# Patient Record
Sex: Female | Born: 1975 | Race: Black or African American | Hispanic: No | Marital: Single | Smoking: Never smoker
Health system: Southern US, Community
[De-identification: ages and names within clinical notes are randomized; demographics above are authoritative.]

## PROBLEM LIST (undated history)

## (undated) DIAGNOSIS — I10 Essential (primary) hypertension: Secondary | ICD-10-CM

## (undated) DIAGNOSIS — I5030 Unspecified diastolic (congestive) heart failure: Secondary | ICD-10-CM

## (undated) DIAGNOSIS — N184 Chronic kidney disease, stage 4 (severe): Secondary | ICD-10-CM

## (undated) DIAGNOSIS — E119 Type 2 diabetes mellitus without complications: Secondary | ICD-10-CM

## (undated) DIAGNOSIS — I509 Heart failure, unspecified: Secondary | ICD-10-CM

## (undated) HISTORY — DX: Heart failure, unspecified: I50.9

## (undated) HISTORY — PX: TUBAL LIGATION: SHX77

---

## 2009-05-11 ENCOUNTER — Ambulatory Visit: Payer: Self-pay | Admitting: Family

## 2012-01-03 DIAGNOSIS — M1710 Unilateral primary osteoarthritis, unspecified knee: Secondary | ICD-10-CM | POA: Diagnosis not present

## 2013-07-10 DIAGNOSIS — R635 Abnormal weight gain: Secondary | ICD-10-CM | POA: Diagnosis not present

## 2013-07-10 DIAGNOSIS — D509 Iron deficiency anemia, unspecified: Secondary | ICD-10-CM | POA: Diagnosis not present

## 2013-07-10 DIAGNOSIS — E669 Obesity, unspecified: Secondary | ICD-10-CM | POA: Diagnosis not present

## 2013-07-10 DIAGNOSIS — I10 Essential (primary) hypertension: Secondary | ICD-10-CM | POA: Diagnosis not present

## 2013-07-16 DIAGNOSIS — R635 Abnormal weight gain: Secondary | ICD-10-CM | POA: Diagnosis not present

## 2013-07-16 DIAGNOSIS — E669 Obesity, unspecified: Secondary | ICD-10-CM | POA: Diagnosis not present

## 2013-07-16 DIAGNOSIS — D509 Iron deficiency anemia, unspecified: Secondary | ICD-10-CM | POA: Diagnosis not present

## 2013-07-16 DIAGNOSIS — I10 Essential (primary) hypertension: Secondary | ICD-10-CM | POA: Diagnosis not present

## 2014-01-05 DIAGNOSIS — M545 Low back pain, unspecified: Secondary | ICD-10-CM | POA: Diagnosis not present

## 2014-01-05 DIAGNOSIS — R079 Chest pain, unspecified: Secondary | ICD-10-CM | POA: Diagnosis not present

## 2014-01-05 DIAGNOSIS — M25519 Pain in unspecified shoulder: Secondary | ICD-10-CM | POA: Diagnosis not present

## 2014-01-05 DIAGNOSIS — M542 Cervicalgia: Secondary | ICD-10-CM | POA: Diagnosis not present

## 2014-02-19 DIAGNOSIS — I10 Essential (primary) hypertension: Secondary | ICD-10-CM | POA: Diagnosis not present

## 2014-02-19 DIAGNOSIS — D509 Iron deficiency anemia, unspecified: Secondary | ICD-10-CM | POA: Diagnosis not present

## 2014-02-19 DIAGNOSIS — S40019A Contusion of unspecified shoulder, initial encounter: Secondary | ICD-10-CM | POA: Diagnosis not present

## 2015-03-04 DIAGNOSIS — E876 Hypokalemia: Secondary | ICD-10-CM | POA: Diagnosis not present

## 2015-03-04 DIAGNOSIS — I501 Left ventricular failure: Secondary | ICD-10-CM | POA: Diagnosis not present

## 2015-03-04 DIAGNOSIS — R739 Hyperglycemia, unspecified: Secondary | ICD-10-CM | POA: Diagnosis not present

## 2015-03-04 DIAGNOSIS — R0602 Shortness of breath: Secondary | ICD-10-CM | POA: Diagnosis not present

## 2015-03-04 DIAGNOSIS — N289 Disorder of kidney and ureter, unspecified: Secondary | ICD-10-CM | POA: Diagnosis not present

## 2015-03-04 DIAGNOSIS — I10 Essential (primary) hypertension: Secondary | ICD-10-CM | POA: Diagnosis not present

## 2015-03-04 DIAGNOSIS — R05 Cough: Secondary | ICD-10-CM | POA: Diagnosis not present

## 2015-03-04 DIAGNOSIS — R079 Chest pain, unspecified: Secondary | ICD-10-CM | POA: Diagnosis not present

## 2015-03-05 ENCOUNTER — Inpatient Hospital Stay: Payer: Self-pay | Admitting: Internal Medicine

## 2015-03-05 DIAGNOSIS — R05 Cough: Secondary | ICD-10-CM | POA: Diagnosis not present

## 2015-03-05 DIAGNOSIS — E876 Hypokalemia: Secondary | ICD-10-CM | POA: Diagnosis not present

## 2015-03-05 DIAGNOSIS — I340 Nonrheumatic mitral (valve) insufficiency: Secondary | ICD-10-CM | POA: Diagnosis not present

## 2015-03-05 DIAGNOSIS — T502X5A Adverse effect of carbonic-anhydrase inhibitors, benzothiadiazides and other diuretics, initial encounter: Secondary | ICD-10-CM | POA: Diagnosis present

## 2015-03-05 DIAGNOSIS — I501 Left ventricular failure: Secondary | ICD-10-CM | POA: Diagnosis not present

## 2015-03-05 DIAGNOSIS — E1165 Type 2 diabetes mellitus with hyperglycemia: Secondary | ICD-10-CM | POA: Diagnosis present

## 2015-03-05 DIAGNOSIS — Z6841 Body Mass Index (BMI) 40.0 and over, adult: Secondary | ICD-10-CM | POA: Diagnosis not present

## 2015-03-05 DIAGNOSIS — R0602 Shortness of breath: Secondary | ICD-10-CM | POA: Diagnosis not present

## 2015-03-05 DIAGNOSIS — N289 Disorder of kidney and ureter, unspecified: Secondary | ICD-10-CM | POA: Diagnosis not present

## 2015-03-05 DIAGNOSIS — I10 Essential (primary) hypertension: Secondary | ICD-10-CM | POA: Diagnosis not present

## 2015-03-05 DIAGNOSIS — I129 Hypertensive chronic kidney disease with stage 1 through stage 4 chronic kidney disease, or unspecified chronic kidney disease: Secondary | ICD-10-CM | POA: Diagnosis present

## 2015-03-05 DIAGNOSIS — N183 Chronic kidney disease, stage 3 (moderate): Secondary | ICD-10-CM | POA: Diagnosis not present

## 2015-03-05 DIAGNOSIS — I429 Cardiomyopathy, unspecified: Secondary | ICD-10-CM | POA: Diagnosis present

## 2015-03-05 DIAGNOSIS — R7301 Impaired fasting glucose: Secondary | ICD-10-CM | POA: Diagnosis not present

## 2015-03-05 DIAGNOSIS — R079 Chest pain, unspecified: Secondary | ICD-10-CM | POA: Diagnosis not present

## 2015-03-18 ENCOUNTER — Emergency Department: Payer: Self-pay | Admitting: Emergency Medicine

## 2015-03-18 DIAGNOSIS — R609 Edema, unspecified: Secondary | ICD-10-CM | POA: Diagnosis not present

## 2015-03-18 DIAGNOSIS — I10 Essential (primary) hypertension: Secondary | ICD-10-CM | POA: Diagnosis not present

## 2015-04-08 ENCOUNTER — Encounter: Payer: Self-pay | Admitting: Family Medicine

## 2015-04-08 DIAGNOSIS — N183 Chronic kidney disease, stage 3 unspecified: Secondary | ICD-10-CM | POA: Insufficient documentation

## 2015-04-08 DIAGNOSIS — I10 Essential (primary) hypertension: Secondary | ICD-10-CM | POA: Insufficient documentation

## 2015-04-08 DIAGNOSIS — E119 Type 2 diabetes mellitus without complications: Secondary | ICD-10-CM | POA: Insufficient documentation

## 2015-04-08 DIAGNOSIS — I429 Cardiomyopathy, unspecified: Secondary | ICD-10-CM | POA: Insufficient documentation

## 2015-04-11 DIAGNOSIS — I10 Essential (primary) hypertension: Secondary | ICD-10-CM | POA: Diagnosis not present

## 2015-04-11 DIAGNOSIS — N183 Chronic kidney disease, stage 3 (moderate): Secondary | ICD-10-CM | POA: Diagnosis not present

## 2015-04-11 DIAGNOSIS — J302 Other seasonal allergic rhinitis: Secondary | ICD-10-CM | POA: Diagnosis not present

## 2015-04-11 DIAGNOSIS — E1122 Type 2 diabetes mellitus with diabetic chronic kidney disease: Secondary | ICD-10-CM | POA: Diagnosis not present

## 2015-04-11 DIAGNOSIS — I429 Cardiomyopathy, unspecified: Secondary | ICD-10-CM | POA: Diagnosis not present

## 2015-04-11 DIAGNOSIS — R188 Other ascites: Secondary | ICD-10-CM | POA: Diagnosis not present

## 2015-04-11 LAB — BASIC METABOLIC PANEL
BUN: 30 mg/dL — AB (ref 4–21)
Creatinine: 1.5 mg/dL — AB (ref <=1.1)

## 2015-04-11 LAB — LIPID PANEL: LDL Cholesterol: 110 mg/dL

## 2015-04-11 LAB — TSH: TSH: 4.95 u[IU]/mL (ref <=5.90)

## 2015-04-11 LAB — HEMOGLOBIN A1C: Hgb A1c MFr Bld: 7 % — AB (ref 4.0–6.0)

## 2015-04-14 ENCOUNTER — Encounter: Payer: Self-pay | Admitting: Family Medicine

## 2015-05-01 NOTE — Discharge Summary (Signed)
PATIENT NAME:  Renee [name], RUSSIN MR#:  [id number] DATE OF BIRTH:  01-10-1976  DATE OF ADMISSION:  03/05/2015 DATE OF DISCHARGE:  03/07/2015  PRIMARY CARE PHYSICIAN: Will be 1 of the following physicians that I gave the patient names to. The patient will call to set up an appointment.   FINAL DIAGNOSES: 1.  Accelerated hypertension.  2.  Cardiomyopathy.  3.  Chronic kidney disease, stage 3. 4.  Diet-controlled diabetes.  5.  Morbid obesity with a body mass index of 42.3.   MEDICATIONS ON DISCHARGE: Include lisinopril 10 mg daily, clonidine 0.2 mg twice a day, labetalol 200 mg twice a day, hydralazine 25 mg 4 times a day.   HOME OXYGEN: None.   DIET: Low-sodium carbohydrate-controlled diet, regular consistency.   ACTIVITY: As tolerated.   FOLLOWUP: In 1-2 weeks with 1 of the primary care physicians that I gave him names to.   HOSPITAL COURSE: The patient was admitted on March 5, discharged March 7, came in with heaviness in the chest, shortness of breath, found to have very elevated blood pressure and hypokalemia.   LABORATORY AND RADIOLOGICAL DATA DURING THE HOSPITAL COURSE: Included an EKG that showed sinus tachycardia, premature atrial complexes, left atrial enlargement, left ventricular hypertrophy. First troponin negative. White blood cell count 14.4, H and H 12.3 and 38.6, platelet count of 348,000. Glucose 146, BUN 23, creatinine 1.48, sodium 141, potassium 2.9, chloride 104, CO2 of 28, calcium 8.9. Liver function tests, albumin low at 2.9. Other liver function tests normal range. Chest x-ray showed mild to moderate cardiomegaly, no evidence of focal infiltrate or pleural effusion.  Next 2 troponins were negative. Echocardiogram showed an EF of 30%-35%, moderately to severely decreased left ventricular systolic function, severe concentric left ventricular hypertrophy, severely increased left ventricular septal thickness, mildly dilated left atrium, moderately elevated pulmonary arterial  systolic pressure. Ultrasound of the kidneys, negative. No evidence of hydronephrosis or other significant abnormality. Creatinine upon discharge 1.62, potassium 3.5. Hemoglobin A1c of 6.7.   Hospital course per problem list:  1.  For the patient's accelerated hypertension, blood pressure needed quite a few medications to get it titrated in the right range. It took a few days to do that. I actually cut back on the Norvasc secondary to cost and went with lisinopril 10 mg daily, clonidine 0.2 mg twice a day, labetalol 200 mg twice a day and hydralazine 25 mg 4 times a day. Blood pressure upon discharge 148/93, which is much improved from the patient's blood pressures and her highest was 225/148.  2.  Cardiomyopathy with a low ejection fraction. No signs of congestive heart failure on this hospital course. Low-dose lisinopril and beta blocker prescribed for cardioprotection. Follow up with medical doctor as outpatient. Patient does have increased septal wall thickness and cardiomyopathy. Further workup can be done as outpatient.  3.  Chronic kidney disease stage 3. Need to watch this closely, and especially starting on ACE inhibitor.  4.  Diet-controlled diabetes. Diet discussed at length.  5.  Morbid obesity with a BMI of 42.3. The patient can consider bariatric surgery referral as outpatient.  6.  Hypokalemia. Hydrochlorothiazide was stopped which she was taking as outpatient. Hopefully, potassium will normalize without a problem. Adding lisinopril should help. I did send off a renin and aldosterone level, which is still pending at this time.   TIME SPENT ON DISCHARGE: 35 minutes.    ____________________________ Tana Conch. Leslye Peer, MD rjw:LT D: 03/07/2015 15:42:34 ET T: 03/07/2015 18:51:15 ET JOB#: [id number]  cc:  Bellatrix Devonshire J. Leslye Peer, MD, <Dictator> Marisue Brooklyn MD ELECTRONICALLY SIGNED 03/09/2015 10:53

## 2015-05-01 NOTE — H&P (Signed)
PATIENT NAME:  Renee [name], Renee [name] MR#:  [id number] DATE OF BIRTH:  Jul 26, 1976  DATE OF ADMISSION:  03/05/2015  REFERRING DOCTOR: Ahmed Prima, MD   PRIMARY CARE PRACTITIONER: [facility]     ADMITTING DOCTOR: Juluis Mire, MD   CHIEF COMPLAINT: Heaviness in the chest with associated shortness of breath ongoing for the past 3 to 4 days.     HISTORY OF PRESENT ILLNESS: A 39 year old African American female with a history of poorly controlled hypertension for the past 1-1/2 years  presents to the Emergency Room with the complaints of heaviness in the chest with associated shortness of breath, which started about 3 to 4 days ago, which gradually worsened. The patient stated that she ran out of her antihypertensive medication, that is hydrochlorothiazide, about 6 days ago, and since then she has been feeling heaviness in the chest with shortness of breath but denies any specific chest pain. She did have some nausea and 1 vomiting but otherwise denies any vomiting at this time. No abdominal pain. Denies any headache. No history of any focal weakness or numbness. No history of any fever. She does have some cough with clear sputum. No urinary symptoms.   In the Emergency Room, the patient was evaluated by the ED physician and was noted to have a significantly elevated blood pressure with systolic of [id number] and diastolic of [id number] mmHg. Workup revealed mildly elevated BUN, creatinine, and a potassium of 2.9 and an EKG with sinus tachycardia with premature atrial contractions. The patient was given IV furosemide and IV labetalol and IV hydralazine for the control of her blood pressure. The chest x-ray done in the ED revealed moderate cardiomegaly and moderate pulmonary vascular congestion. At the current time patient is comfortably resting in the bed. She looks a little bit anxious but denies any chest discomfort, shortness of breath. She diuresed following IV furosemide. As mentioned earlier, the patient  was diagnosed to have hypertension about 1-1/2 years ago, and since then she has been put on hydrochlorothiazide 25 mg 1 tablet a day and she mentions that her blood pressure has been poorly controlled.   PAST MEDICAL HISTORY: Hypertension.   PAST SURGICAL HISTORY:  1.  Tubal ligation.  2.  C-section.   ALLERGIES: No known drug allergies.   HOME MEDICATIONS: Hydrochlorothiazide.   FAMILY HISTORY: Mom with congestive heart failure, diabetes, and hypertension.   SOCIAL HISTORY: She is single. She used to be a Music therapist. Currently unemployed. Denies any history of smoking, occasional alcohol intake, and denies any substance abuse.    REVIEW OF SYSTEMS:  CONSTITUTIONAL: Negative for fever or chills. She does have some generalized weakness for the past 2 to 3 days with shortness of breath.  EYES: Negative for blurred vision or double vision. No pain. No redness. No discharge.  EARS, NOSE, AND THROAT: Negative for tinnitus, ear pain, hearing loss, epistaxis, nasal discharge.  RESPIRATORY: Positive for some cough with clear sputum, but she did have some heaviness in the chest with shortness of breath but denies any wheezing. No hemoptysis. No painful respirations.  CARDIOVASCULAR: Positive for heaviness in the chest but negative for any chest pain. Denies any palpitations. No dizziness. No syncopal episodes. She does have some shortness of breath with heaviness in the chest. Denies any pedal edema.  GASTROINTESTINAL: Positive for nausea but had some vomiting earlier but no vomiting now. No abdominal pain. No hematemesis. No melena. No GERD symptoms.  GENITOURINARY: Negative for dysuria, frequency, urgency.  ENDOCRINE: Negative  for polyuria, nocturia, heat or cold intolerance.  HEMATOLOGY AND LYMPHATICS: Negative for anemia, easy bruising, bleeding.  INTEGUMENTARY: Negative for acne, skin rash, or lesions.  MUSCULOSKELETAL: No history of arthritis. Negative for back pain or hip pain.   NEUROLOGICAL: No focal weakness or numbness. No history of CVA, TIA, seizure disorder. No headaches.  PSYCHIATRIC: Negative for anxiety, insomnia, depression.   PHYSICAL EXAMINATION:  VITAL SIGNS: Temperature 98.6 degrees Fahrenheit. Pulse rate 100 per minute. Respirations 20 per minute. Blood pressure on arrival 240/190, current blood pressure is 210/120. O2 saturation is 96% on room air.  GENERAL: Well-developed, well-nourished, alert, in no acute distress, comfortable, resting in bed, mildly anxious looking.  HEAD: Atraumatic, normocephalic.  EYES: Pupils equal, react to light and accommodation. No conjunctival pallor. No icterus. Extraocular movements intact.  NOSE: No drainage. No lesions.  EARS: No drainage. No external lesions.  ORAL CAVITY: No mucosal lesions. No exudates.  NECK: Supple. No JVD. No thyromegaly. No carotid bruit. Range of motion of neck within normal limits.  RESPIRATORY: Good respiratory effort. Not using accessory muscles of respiration. Bilateral vesicular breath sounds present. No rales or rhonchi.  CARDIOVASCULAR: S1, S2 regular. Tachycardia present. No murmurs, gallops, or clicks. Pulses equal carotid, femoral, and pedal pulses, no peripheral edema.  GASTROINTESTINAL: Abdomen soft, nontender. No hepatosplenomegaly. No masses. No rigidity. No guarding. Bowel sounds present and equal in all 4 quadrants.  GENITOURINARY: Deferred.  MUSCULOSKELETAL: No joint tenderness or effusion. Range of motion adequate. Strength and tone equal bilaterally.  SKIN: Inspection within normal limits. No obvious wounds.  LYMPHATIC: No cervical lymphadenopathy.  VASCULAR: Good dorsalis pedis and posterior tibial pulses.  NEUROLOGICAL: Alert, awake, and oriented x 3. Cranial nerves II through XII grossly intact. No sensory deficit. Motor strength 5/5 in both upper and lower extremities. DTRs 2+ bilateral and symmetrical. Plantars downgoing.  PSYCHIATRIC: Alert, awake, and oriented x 3.  Judgment and insight adequate. Memory and mood within normal limits.   ANCILLARY DATA:  LABORATORY DATA: Serum glucose 146, BUN 23, creatinine 1.48, serum sodium 141, potassium 2.9, chloride 104, bicarbonate 28, total calcium 8.9, total protein 7.3, albumin 2.9, total bilirubin 0.5, alkaline phosphatase 105, AST 27, ALT 40, troponin 0.05, WBC 14.4, hemoglobin 12.3, hematocrit 38.6, platelet count 348.  IMAGING STUDIES: Chest x-ray:  Mild to moderate cardiomegaly and pulmonary vascular congestion. No evidence of focal infiltrate or pleural effusion.   EKG: Sinus tachycardia with ventricular rate of 107 beats per minute, premature atrial contractions present. LVH present.   ASSESSMENT AND PLAN: A 39 year old [place] American female with a history of poorly controlled hypertension presents with the complaints of shortness of breath with heaviness in the chest ongoing for the past 3 to 4 days following, patient ran out of her blood pressure medication for the past 6 days, found to have elevated blood pressure of 240/190 in the Emergency Room and a chest x-ray with mild pulmonary vascular congestion.  1.  Malignant hypertensive urgency secondary to ran out of blood pressure medication. The patient's baseline blood pressure not optimally controlled. Plan: Admit to step-down unit in CCU, start labetalol and hydralazine, monitor clinically. We will order a renal sonogram to evaluate for secondary cause of hypertension because of history of poorly controlled hypertension in patient with renal insufficiency.  2.  Hypokalemia likely secondary to hydrochlorothiazide usage. Plan: Potassium supplementation IV, follow  BMP.  3.  Renal insufficiency with a creatinine of 1.48. Baseline creatinine not known. The patient not aware of any kidney dysfunction. Likely  chronic. Plan: Avoid nephrotoxic agents, encourage p.o. fluid intake, and check renal ultrasound to evaluate for any  kidney disease and follow up BMP.  4.   Elevated blood sugar of 146. Family history of diabetes mellitus. No history of any diabetes in the past. We will check A1c and monitor blood sugars and further workup accordingly.  5.  Deep vein thrombosis prophylaxis. Subcutaneous heparin.  6.  Gastrointestinal prophylaxis. Proton pump inhibitor.   CODE STATUS: Full code.   TIME SPENT: 50 minutes.    ____________________________ Juluis Mire, MD enr:AT D: 03/05/2015 00:35:58 ET T: 03/05/2015 01:15:10 ET JOB#: [id number]  cc: Juluis Mire, MD, <Dictator> Primary Care Practitioner, [place] MD ELECTRONICALLY SIGNED 03/06/2015 6:54

## 2015-07-26 ENCOUNTER — Encounter: Payer: Self-pay | Admitting: Family Medicine

## 2015-07-26 ENCOUNTER — Ambulatory Visit: Payer: Self-pay | Admitting: Family Medicine

## 2015-09-01 ENCOUNTER — Other Ambulatory Visit: Payer: Self-pay

## 2015-09-09 ENCOUNTER — Ambulatory Visit: Payer: Self-pay | Admitting: Family Medicine

## 2015-09-13 ENCOUNTER — Other Ambulatory Visit: Payer: Self-pay

## 2015-09-13 MED ORDER — CLONIDINE HCL 0.2 MG PO TABS: 0.2000 mg | ORAL_TABLET | Freq: Two times a day (BID) | ORAL | Status: DC

## 2015-09-13 MED ORDER — HYDRALAZINE HCL 50 MG PO TABS: 50.0000 mg | ORAL_TABLET | Freq: Two times a day (BID) | ORAL | Status: DC

## 2015-10-13 ENCOUNTER — Telehealth: Payer: Self-pay

## 2015-10-13 MED ORDER — LISINOPRIL 10 MG PO TABS: 10.0000 mg | ORAL_TABLET | Freq: Two times a day (BID) | ORAL | Status: DC

## 2015-10-13 NOTE — Telephone Encounter (Signed)
Entered in error- supposed to be a refill encounter

## 2015-10-13 NOTE — Addendum Note (Signed)
Addended by: Adline Potter on: 10/13/2015 03:57 PM   Modules accepted: Orders

## 2015-10-13 NOTE — Telephone Encounter (Signed)
Lisinopril refilled.

## 2015-12-14 ENCOUNTER — Other Ambulatory Visit: Payer: Self-pay | Admitting: Family Medicine

## 2015-12-14 MED ORDER — CLONIDINE HCL 0.2 MG PO TABS: 0.2000 mg | ORAL_TABLET | Freq: Two times a day (BID) | ORAL | Status: DC

## 2015-12-14 MED ORDER — HYDRALAZINE HCL 50 MG PO TABS: 50.0000 mg | ORAL_TABLET | Freq: Two times a day (BID) | ORAL | Status: DC

## 2016-01-31 ENCOUNTER — Other Ambulatory Visit: Payer: Self-pay | Admitting: Family Medicine

## 2016-01-31 MED ORDER — HYDRALAZINE HCL 50 MG PO TABS: 50.0000 mg | ORAL_TABLET | Freq: Two times a day (BID) | ORAL | Status: DC

## 2016-04-18 ENCOUNTER — Ambulatory Visit: Payer: Self-pay | Admitting: Family Medicine

## 2016-11-14 ENCOUNTER — Other Ambulatory Visit: Payer: Self-pay | Admitting: Internal Medicine

## 2016-11-14 MED ORDER — LISINOPRIL 10 MG PO TABS: 10.0000 mg | ORAL_TABLET | Freq: Two times a day (BID) | ORAL | 0 refills | Status: DC

## 2016-12-17 ENCOUNTER — Other Ambulatory Visit: Payer: Self-pay | Admitting: Family Medicine

## 2016-12-17 MED ORDER — LISINOPRIL 10 MG PO TABS: 10.0000 mg | ORAL_TABLET | Freq: Two times a day (BID) | ORAL | 0 refills | Status: DC

## 2016-12-20 ENCOUNTER — Inpatient Hospital Stay
Admission: EM | Admit: 2016-12-20 | Discharge: 2016-12-21 | DRG: 291 | Disposition: A | Discharge status: Home / Self Care | Payer: Medicare Other | Attending: Internal Medicine | Admitting: Internal Medicine

## 2016-12-20 ENCOUNTER — Emergency Department: Payer: Medicare Other

## 2016-12-20 ENCOUNTER — Inpatient Hospital Stay
Admit: 2016-12-20 | Discharge: 2016-12-20 | Disposition: A | Discharge status: Home / Self Care | Payer: Medicare Other | Attending: Internal Medicine | Admitting: Internal Medicine

## 2016-12-20 ENCOUNTER — Encounter: Payer: Self-pay | Admitting: *Deleted

## 2016-12-20 DIAGNOSIS — I110 Hypertensive heart disease with heart failure: Secondary | ICD-10-CM | POA: Diagnosis not present

## 2016-12-20 DIAGNOSIS — I160 Hypertensive urgency: Secondary | ICD-10-CM | POA: Diagnosis not present

## 2016-12-20 DIAGNOSIS — I509 Heart failure, unspecified: Secondary | ICD-10-CM | POA: Diagnosis present

## 2016-12-20 DIAGNOSIS — Z8249 Family history of ischemic heart disease and other diseases of the circulatory system: Secondary | ICD-10-CM | POA: Diagnosis not present

## 2016-12-20 DIAGNOSIS — I10 Essential (primary) hypertension: Secondary | ICD-10-CM | POA: Diagnosis present

## 2016-12-20 DIAGNOSIS — N183 Chronic kidney disease, stage 3 (moderate): Secondary | ICD-10-CM | POA: Diagnosis not present

## 2016-12-20 DIAGNOSIS — R05 Cough: Secondary | ICD-10-CM | POA: Diagnosis not present

## 2016-12-20 DIAGNOSIS — N179 Acute kidney failure, unspecified: Secondary | ICD-10-CM | POA: Diagnosis not present

## 2016-12-20 DIAGNOSIS — R0602 Shortness of breath: Secondary | ICD-10-CM | POA: Diagnosis not present

## 2016-12-20 DIAGNOSIS — J189 Pneumonia, unspecified organism: Secondary | ICD-10-CM

## 2016-12-20 DIAGNOSIS — J181 Lobar pneumonia, unspecified organism: Secondary | ICD-10-CM

## 2016-12-20 DIAGNOSIS — I5032 Chronic diastolic (congestive) heart failure: Secondary | ICD-10-CM | POA: Diagnosis not present

## 2016-12-20 HISTORY — DX: Essential (primary) hypertension: I10

## 2016-12-20 LAB — COMPREHENSIVE METABOLIC PANEL
ALBUMIN: 3.8 g/dL (ref 3.5–5.0)
ALK PHOS: 78 U/L (ref 38–126)
ALT: 26 U/L (ref 14–54)
ANION GAP: 9 (ref 5–15)
AST: 18 U/L (ref 15–41)
BILIRUBIN TOTAL: 1.3 mg/dL — AB (ref 0.3–1.2)
BUN: 23 mg/dL — ABNORMAL HIGH (ref 6–20)
CALCIUM: 9.3 mg/dL (ref 8.9–10.3)
CO2: 23 mmol/L (ref 22–32)
Chloride: 103 mmol/L (ref 101–111)
Creatinine, Ser: 1.99 mg/dL — ABNORMAL HIGH (ref 0.44–1.00)
GFR calc non Af Amer: 30 mL/min — ABNORMAL LOW (ref >60)
GFR, EST AFRICAN AMERICAN: 35 mL/min — AB (ref >60)
Glucose, Bld: 161 mg/dL — ABNORMAL HIGH (ref 65–99)
POTASSIUM: 3.5 mmol/L (ref 3.5–5.1)
Sodium: 135 mmol/L (ref 135–145)
TOTAL PROTEIN: 8.3 g/dL — AB (ref 6.5–8.1)

## 2016-12-20 LAB — CBC
HEMATOCRIT: 40.2 % (ref 35.0–47.0)
HEMOGLOBIN: 13.1 g/dL (ref 12.0–16.0)
MCH: 24.1 pg — ABNORMAL LOW (ref 26.0–34.0)
MCHC: 32.6 g/dL (ref 32.0–36.0)
MCV: 74 fL — ABNORMAL LOW (ref 80.0–100.0)
Platelets: 352 10*3/uL (ref 150–440)
RBC: 5.44 MIL/uL — AB (ref 3.80–5.20)
RDW: 18.1 % — ABNORMAL HIGH (ref 11.5–14.5)
WBC: 17 10*3/uL — AB (ref 3.6–11.0)

## 2016-12-20 LAB — PROCALCITONIN: PROCALCITONIN: 0.11 ng/mL

## 2016-12-20 LAB — LIPASE, BLOOD: Lipase: 13 U/L (ref 11–51)

## 2016-12-20 MED ORDER — ACETAMINOPHEN 650 MG RE SUPP: 650.0000 mg | Freq: Four times a day (QID) | RECTAL | Status: DC | PRN

## 2016-12-20 MED ORDER — ACETAMINOPHEN 325 MG PO TABS
650.0000 mg | ORAL_TABLET | Freq: Four times a day (QID) | ORAL | Status: DC | PRN
  Administered 2016-12-21: 650 mg via ORAL
  Filled 2016-12-20: qty 2

## 2016-12-20 MED ORDER — ENOXAPARIN SODIUM 40 MG/0.4ML ~~LOC~~ SOLN: 40.0000 mg | SUBCUTANEOUS | Status: DC

## 2016-12-20 MED ORDER — CLONIDINE HCL 0.1 MG PO TABS
0.2000 mg | ORAL_TABLET | Freq: Two times a day (BID) | ORAL | Status: DC
  Administered 2016-12-20 – 2016-12-21 (×3): 0.2 mg via ORAL
  Filled 2016-12-20 (×3): qty 2

## 2016-12-20 MED ORDER — FLUTICASONE PROPIONATE 50 MCG/ACT NA SUSP
2.0000 | Freq: Every day | NASAL | Status: DC
  Administered 2016-12-21: 2 via NASAL
  Filled 2016-12-20: qty 16

## 2016-12-20 MED ORDER — CLONIDINE HCL 0.1 MG PO TABS
0.2000 mg | ORAL_TABLET | Freq: Once | ORAL | Status: AC
  Administered 2016-12-20: 0.2 mg via ORAL
  Filled 2016-12-20: qty 2

## 2016-12-20 MED ORDER — DEXTROSE 5 % IV SOLN: 500.0000 mg | Freq: Once | INTRAVENOUS | Status: DC

## 2016-12-20 MED ORDER — LABETALOL HCL 5 MG/ML IV SOLN
10.0000 mg | Freq: Once | INTRAVENOUS | Status: AC
  Administered 2016-12-20: 10 mg via INTRAVENOUS
  Filled 2016-12-20: qty 4

## 2016-12-20 MED ORDER — SODIUM CHLORIDE 0.9% FLUSH
3.0000 mL | Freq: Two times a day (BID) | INTRAVENOUS | Status: DC
  Administered 2016-12-20 – 2016-12-21 (×2): 3 mL via INTRAVENOUS

## 2016-12-20 MED ORDER — LABETALOL HCL 200 MG PO TABS
200.0000 mg | ORAL_TABLET | Freq: Two times a day (BID) | ORAL | Status: DC
  Administered 2016-12-20 – 2016-12-21 (×2): 200 mg via ORAL
  Filled 2016-12-20 (×5): qty 1

## 2016-12-20 MED ORDER — HYDRALAZINE HCL 20 MG/ML IJ SOLN: 10.0000 mg | Freq: Four times a day (QID) | INTRAMUSCULAR | Status: DC | PRN

## 2016-12-20 MED ORDER — SENNOSIDES-DOCUSATE SODIUM 8.6-50 MG PO TABS: 1.0000 | ORAL_TABLET | Freq: Every evening | ORAL | Status: DC | PRN

## 2016-12-20 MED ORDER — CEFTRIAXONE SODIUM-DEXTROSE 1-3.74 GM-% IV SOLR
1.0000 g | Freq: Once | INTRAVENOUS | Status: AC
  Administered 2016-12-20: 1 g via INTRAVENOUS

## 2016-12-20 MED ORDER — ENOXAPARIN SODIUM 40 MG/0.4ML ~~LOC~~ SOLN
40.0000 mg | Freq: Two times a day (BID) | SUBCUTANEOUS | Status: DC
  Filled 2016-12-20 (×2): qty 0.4

## 2016-12-20 MED ORDER — LISINOPRIL 20 MG PO TABS
40.0000 mg | ORAL_TABLET | Freq: Every day | ORAL | Status: DC
  Administered 2016-12-20 – 2016-12-21 (×2): 40 mg via ORAL
  Filled 2016-12-20 (×2): qty 2

## 2016-12-20 MED ORDER — ONDANSETRON HCL 4 MG/2ML IJ SOLN: 4.0000 mg | Freq: Four times a day (QID) | INTRAMUSCULAR | Status: DC | PRN

## 2016-12-20 MED ORDER — IPRATROPIUM-ALBUTEROL 0.5-2.5 (3) MG/3ML IN SOLN
3.0000 mL | Freq: Once | RESPIRATORY_TRACT | Status: AC
  Administered 2016-12-20: 3 mL via RESPIRATORY_TRACT
  Filled 2016-12-20: qty 3

## 2016-12-20 MED ORDER — HYDRALAZINE HCL 50 MG PO TABS
100.0000 mg | ORAL_TABLET | Freq: Two times a day (BID) | ORAL | Status: DC
  Administered 2016-12-20 – 2016-12-21 (×3): 100 mg via ORAL
  Filled 2016-12-20 (×3): qty 2

## 2016-12-20 MED ORDER — ONDANSETRON HCL 4 MG PO TABS: 4.0000 mg | ORAL_TABLET | Freq: Four times a day (QID) | ORAL | Status: DC | PRN

## 2016-12-20 MED ORDER — DEXTROSE 5 % IV SOLN: 1.0000 g | Freq: Once | INTRAVENOUS | Status: DC

## 2016-12-20 MED ORDER — CEFTRIAXONE SODIUM-DEXTROSE 1-3.74 GM-% IV SOLR
INTRAVENOUS | Status: AC
  Administered 2016-12-20: 1 g via INTRAVENOUS
  Filled 2016-12-20: qty 50

## 2016-12-20 MED ORDER — FUROSEMIDE 40 MG PO TABS
40.0000 mg | ORAL_TABLET | Freq: Every day | ORAL | Status: DC
  Administered 2016-12-20 – 2016-12-21 (×2): 40 mg via ORAL
  Filled 2016-12-20 (×2): qty 1

## 2016-12-20 MED ORDER — LEVOFLOXACIN IN D5W 750 MG/150ML IV SOLN
750.0000 mg | INTRAVENOUS | Status: DC
  Administered 2016-12-20: 750 mg via INTRAVENOUS
  Filled 2016-12-20: qty 150

## 2016-12-20 NOTE — Progress Notes (Signed)
Pharmacy Antibiotic Note  Jazlynne Milliner is a 40 y.o. female admitted on 12/20/2016 with pneumonia.  Pharmacy has been consulted for levofloxacin dosing.  Patient was prescribed azithromycin in the ED, however dose has not been charted as given. Discontinued azithromycin and changed antibiotics to levofloxacin per consult.  Plan: Levofloxacin 750 mg IV q48h  PCT ordered  Height: 5\' 4"  (162.6 cm) Weight: 254 lb (115.2 kg) IBW/kg (Calculated) : 54.7  Temp (24hrs), Avg:98.4 F (36.9 C), Min:98.4 F (36.9 C), Max:98.4 F (36.9 C)   Recent Labs Lab 12/20/16 1006  WBC 17.0*  CREATININE 1.99*    Estimated Creatinine Clearance: 46.3 mL/min (by C-G formula based on SCr of 1.99 mg/dL (H)).    No Known Allergies  Antimicrobials this admission: levofloxacin 12/21 >>  CTX one dose in ED 12/21  Dose adjustments this admission:  Microbiology results: 12/21 BCx: Sent  Thank you for allowing pharmacy to be a part of this patient's care.  Lenis Noon, PharmD, BCPS Clinical Pharmacist 12/20/2016 12:31 PM

## 2016-12-20 NOTE — ED Provider Notes (Signed)
[facility] Emergency Department Provider Note   ____________________________________________    I have reviewed the triage vital signs and the nursing notes.   HISTORY  Chief Complaint Emesis     HPI Renee [name] is a 40 y.o. female who presents with complaints of severe cough. Patient reports over the last several days she has coughed so hard that she frequently vomits after coughing. She feels mildly short of breath as well. This is worse when she lies flat. She denies fevers or chills. No recent travel. No abdominal pain except after vomiting from cramping. No calf pain or swelling. No history of similar symptoms. No sick contacts   Past Medical History:  Diagnosis Date  . Hypertension     Patient Active Problem List   Diagnosis Date Noted  . Cardiomyopathy ([place]) 04/08/2015  . Chronic kidney disease (CKD), stage III (moderate) 04/08/2015  . Accelerated hypertension 04/08/2015  . Diabetes mellitus, type 2 ([place]) 04/08/2015    History reviewed. No pertinent surgical history.  Prior to Admission medications   Medication Sig Start Date End Date Taking? Authorizing Provider  cloNIDine (CATAPRES) 0.2 MG tablet Take 1 tablet (0.2 mg total) by mouth 2 (two) times daily. 12/14/15   Adline Potter, MD  fluticasone (FLONASE) 50 MCG/ACT nasal spray Place 2 sprays into both nostrils daily.    Historical Provider, MD  furosemide (LASIX) 20 MG tablet Take 20 mg by mouth daily.    Historical Provider, MD  hydrALAZINE (APRESOLINE) 50 MG tablet Take 1 tablet (50 mg total) by mouth 2 (two) times daily. 01/31/16   Adline Potter, MD  labetalol (NORMODYNE) 200 MG tablet Take 200 mg by mouth 2 (two) times daily.    Historical Provider, MD  lisinopril (PRINIVIL,ZESTRIL) 10 MG tablet Take 1 tablet (10 mg total) by mouth 2 (two) times daily. 12/17/16   Adline Potter, MD     Allergies Patient has no known allergies.  History reviewed. No pertinent family  history.  Social History Social History  Substance Use Topics  . Smoking status: Never Smoker  . Smokeless tobacco: Not on file  . Alcohol use Not on file    Review of Systems  Constitutional: No fever Eyes: No visual changes.  ENT: No sore throat. Cardiovascular: Denies chest pain. Respiratory: As above Gastrointestinal: No abdominal pain.   Genitourinary: Negative for dysuria. Musculoskeletal: Mild back pain Skin: Negative for rash. Neurological: Negative for headaches or weakness  10-point ROS otherwise negative.  ____________________________________________   PHYSICAL EXAM:  VITAL SIGNS: ED Triage Vitals [12/20/16 1006]  Enc Vitals Group     BP (!) 260/134     Pulse Rate 97     Resp 18     Temp 98.4 F (36.9 C)     Temp Source Oral     SpO2 95 %     Weight 254 lb (115.2 kg)     Height 5\' 4"  (1.626 m)     Head Circumference      Peak Flow      Pain Score 7     Pain Loc      Pain Edu?      Excl. in [place]?     Constitutional: Alert and oriented. No acute distress.  Eyes: Conjunctivae are normal.  Head: Atraumatic. Nose: No congestion/rhinnorhea. Mouth/Throat: Mucous membranes are moist.    Cardiovascular: Normal rate, regular rhythm. Grossly normal heart sounds.  Good peripheral circulation. Respiratory: Normal respiratory effort.  No retractions. Lungs clear to auscultation  Gastrointestinal: Soft and nontender. No distention.  No CVA tenderness. Genitourinary: deferred Musculoskeletal: No lower extremity tenderness nor edema.  Warm and well perfused Neurologic:  Normal speech and language. No gross focal neurologic deficits are appreciated.  Skin:  Skin is warm, dry and intact. No rash noted. Psychiatric: Mood and affect are normal. Speech and behavior are normal.  ____________________________________________   LABS (all labs ordered are listed, but only abnormal results are displayed)  Labs Reviewed  COMPREHENSIVE METABOLIC PANEL - Abnormal;  Notable for the following:       Result Value   Glucose, Bld 161 (*)    BUN 23 (*)    Creatinine, Ser 1.99 (*)    Total Protein 8.3 (*)    Total Bilirubin 1.3 (*)    GFR calc non Af Amer 30 (*)    GFR calc Af Amer 35 (*)    All other components within normal limits  CBC - Abnormal; Notable for the following:    WBC 17.0 (*)    RBC 5.44 (*)    MCV 74.0 (*)    MCH 24.1 (*)    RDW 18.1 (*)    All other components within normal limits  LIPASE, BLOOD  URINALYSIS, COMPLETE (UACMP) WITH MICROSCOPIC   ____________________________________________  EKG  None ____________________________________________  RADIOLOGY  Chest x-ray concerning for right lower lobe pneumonia as well as pulmonary vascular congestion ____________________________________________   PROCEDURES  Procedure(s) performed: No    Critical Care performed: No ____________________________________________   INITIAL IMPRESSION / ASSESSMENT AND PLAN / ED COURSE  Pertinent labs & imaging results that were available during my care of the patient were reviewed by me and considered in my medical decision making (see chart for details).  Patient's workup is concerning for pneumonia as well as acute kidney injury likely related to extremely high blood pressure she will require admission. We'll give IV Rocephin and is otherwise in an labetalol IV for her blood pressure  Clinical Course    ____________________________________________   FINAL CLINICAL IMPRESSION(S) / ED DIAGNOSES  Final diagnoses:  Community acquired pneumonia of right lower lobe of lung ([place])  Acute kidney injury ([place])  Hypertensive urgency      NEW MEDICATIONS STARTED DURING THIS VISIT:  New Prescriptions   No medications on file     Note:  This document was prepared using Dragon voice recognition software and may include unintentional dictation errors.    Lavonia Drafts, MD 12/20/16 [phone]

## 2016-12-20 NOTE — ED Triage Notes (Signed)
States vomiting, chills, and body aches with loss of appetite since Monday, pt awake and alert in no acute distress

## 2016-12-20 NOTE — ED Notes (Signed)
Spoke with Dr. Benjie Karvonen about BP.  She states patient is appropriate for telemetry unit due to history of HTN and non-compliance with meds.

## 2016-12-20 NOTE — Progress Notes (Signed)
Lovenox ordered for the 40 yo female with BMI of 43 and CrCl of 46.6ml/min.  Dose has been adjusted to 40mg  q12hr as recommended based on BMI.  Charlane Ferretti, RPh

## 2016-12-20 NOTE — H&P (Signed)
[name] NAME: Renee [name]    MR#:  [id number]  DATE OF BIRTH:  1976-01-13  DATE OF ADMISSION:  12/20/2016  PRIMARY CARE PHYSICIAN: [facility] Dr Vicente Masson  REQUESTING/REFERRING PHYSICIAN: dr Corky Downs  CHIEF COMPLAINT:    Shortness of breath HISTORY OF PRESENT ILLNESS:  Renee [name]  is a 40 y.o. female with a known history of Uncontrolled hypertension who comes in with above complaint. Patient reports that she has been coughing for the past 2 days and today due to coughing she had emesis. She also is reporting shortness of breath, low-grade fever and chills. She denies productive sputum. Her normal blood pressures she reports is 180-190. She denies sick contacts or recent travel. Chest x-ray shows right lower lobe pneumonia.  PAST MEDICAL HISTORY:   Past Medical History:  Diagnosis Date  . Hypertension     PAST SURGICAL HISTORY:  None  SOCIAL HISTORY:   Social History  Substance Use Topics  . Smoking status: Never Smoker  . Smokeless tobacco: No   . Alcohol use no    FAMILY HISTORY:  Positive hypertension  DRUG ALLERGIES:  No Known Allergies  REVIEW OF SYSTEMS:   Review of Systems  Constitutional: Positive for fever. Negative for malaise/fatigue.  HENT: Negative.  Negative for ear discharge, ear pain, hearing loss, nosebleeds and sore throat.   Eyes: Negative.  Negative for blurred vision and pain.  Respiratory: Positive for cough and shortness of breath. Negative for hemoptysis and wheezing.   Cardiovascular: Negative.  Negative for chest pain, palpitations and leg swelling.  Gastrointestinal: Negative.  Negative for abdominal pain, blood in stool, diarrhea, nausea and vomiting.  Genitourinary: Negative.  Negative for dysuria.  Musculoskeletal: Negative.  Negative for back pain.  Skin: Negative.   Neurological: Negative for dizziness, tremors, speech change, focal weakness, seizures and  headaches.  Endo/Heme/Allergies: Negative.  Does not bruise/bleed easily.  Psychiatric/Behavioral: Negative.  Negative for depression, hallucinations and suicidal ideas.    MEDICATIONS AT HOME:   Prior to Admission medications   Medication Sig Start Date End Date Taking? Authorizing Provider  cloNIDine (CATAPRES) 0.2 MG tablet Take 1 tablet (0.2 mg total) by mouth 2 (two) times daily. 12/14/15   Adline Potter, MD  fluticasone (FLONASE) 50 MCG/ACT nasal spray Place 2 sprays into both nostrils daily.    Historical Provider, MD  furosemide (LASIX) 20 MG tablet Take 20 mg by mouth daily.    Historical Provider, MD  hydrALAZINE (APRESOLINE) 50 MG tablet Take 1 tablet (50 mg total) by mouth 2 (two) times daily. 01/31/16   Adline Potter, MD  labetalol (NORMODYNE) 200 MG tablet Take 200 mg by mouth 2 (two) times daily.    Historical Provider, MD  lisinopril (PRINIVIL,ZESTRIL) 10 MG tablet Take 1 tablet (10 mg total) by mouth 2 (two) times daily. 12/17/16   Adline Potter, MD      VITAL SIGNS:  Blood pressure (!) 260/134, pulse 97, temperature 98.4 F (36.9 C), temperature source Oral, resp. rate 18, height 5\' 4"  (1.626 m), weight 115.2 kg (254 lb), last menstrual period 11/14/2016, SpO2 95 %.  PHYSICAL EXAMINATION:   Physical Exam  Constitutional: She is oriented to person, place, and time and well-developed, well-nourished, and in no distress. No distress.  HENT:  Head: Normocephalic.  Eyes: No scleral icterus.  Neck: Normal range of motion. Neck supple. No JVD present. No tracheal deviation present.  Cardiovascular: Normal rate, regular rhythm and normal heart  sounds.  Exam reveals no gallop and no friction rub.   No murmur heard. Pulmonary/Chest: Effort normal and breath sounds normal. No respiratory distress. She has no wheezes. She has no rales. She exhibits no tenderness.  Abdominal: Soft. Bowel sounds are normal. She exhibits no distension and no mass. There is no tenderness. There is  no rebound and no guarding.  Musculoskeletal: Normal range of motion. She exhibits no edema.  Neurological: She is alert and oriented to person, place, and time.  Skin: Skin is warm. No rash noted. No erythema.  Psychiatric: Affect and judgment normal.      LABORATORY PANEL:   CBC  Recent Labs Lab 12/20/16 1006  WBC 17.0*  HGB 13.1  HCT 40.2  PLT 352   ------------------------------------------------------------------------------------------------------------------  Chemistries   Recent Labs Lab 12/20/16 1006  NA 135  K 3.5  CL 103  CO2 23  GLUCOSE 161*  BUN 23*  CREATININE 1.99*  CALCIUM 9.3  AST 18  ALT 26  ALKPHOS 78  BILITOT 1.3*   ------------------------------------------------------------------------------------------------------------------  Cardiac Enzymes No results for input(s): TROPONINI in the last 168 hours. ------------------------------------------------------------------------------------------------------------------  RADIOLOGY:  Dg Chest 2 View  Result Date: 12/20/2016 CLINICAL DATA:  Vomiting, chills, cough, and body aches with decreased appetite for the past 4 days. The patient experienced shortness of breath over this past weekend. Nonsmoker. EXAM: CHEST  2 VIEW COMPARISON:  Chest x-ray of March 04, 2015 FINDINGS: The lungs are well-expanded. There is increased density in the right lower lobe. The left lung is clear. The cardiac silhouette is mildly enlarged though stable. There is central pulmonary vascular prominence slightly less conspicuous than on the previous study. The mediastinum is normal in width. The trachea is midline. There is no pleural effusion. The bony thorax exhibits no acute abnormality. IMPRESSION: Atelectasis or pneumonia in the right lower lobe. Chronic cardiomegaly and pulmonary vascular congestion consistent with low-grade CHF. Followup PA and lateral chest X-ray is recommended in 3-4 weeks following trial of antibiotic  therapy to ensure resolution of the suspected right lower lobe pneumonia. Electronically Signed   By: David  Martinique M.D.   On: 12/20/2016 11:47    EKG:   Ordered IMPRESSION AND PLAN:    40 year old female with uncontrolled hypertension who presents with shortness of breath and emesis and found to have community-acquired pneumonia with low-grade CHF.  1. Community-acquired pneumonia: Continue Levaquin and follow up on blood cultures  2. Low-grade congestive heart failure, new, possibly due to uncontrolled hypertension: Echocardiogram ordered Lasix 40 mg daily Check TSH   3. Accelerated essential hypertension: Lisinopril dose increased. Continue clonidine, labetalol and hydralazine.  4. AKI: Will not start IVF due to #2. BMP for am, LISINOPRIL increased.  All the records are reviewed and case discussed with ED provider. Management plans discussed with the patient and she is in agreement  CODE STATUS: full  TOTAL TIME TAKING CARE OF THIS PATIENT: 45 minutes.    Anistyn Graddy M.D on 12/20/2016 at 12:22 PM  Between 7am to 6pm - Pager - [phone]  After 6pm go to [email or web address] - Proofreader  [facility]  Office  [phone]  CC: Primary care physician; [place]   \

## 2016-12-21 LAB — BASIC METABOLIC PANEL
ANION GAP: 8 (ref 5–15)
BUN: 33 mg/dL — AB (ref 6–20)
CHLORIDE: 102 mmol/L (ref 101–111)
CO2: 24 mmol/L (ref 22–32)
Calcium: 8.8 mg/dL — ABNORMAL LOW (ref 8.9–10.3)
Creatinine, Ser: 2.44 mg/dL — ABNORMAL HIGH (ref 0.44–1.00)
GFR calc Af Amer: 27 mL/min — ABNORMAL LOW (ref >60)
GFR, EST NON AFRICAN AMERICAN: 23 mL/min — AB (ref >60)
GLUCOSE: 174 mg/dL — AB (ref 65–99)
POTASSIUM: 3.3 mmol/L — AB (ref 3.5–5.1)
Sodium: 134 mmol/L — ABNORMAL LOW (ref 135–145)

## 2016-12-21 LAB — URINALYSIS, COMPLETE (UACMP) WITH MICROSCOPIC
Bacteria, UA: NONE SEEN
Bilirubin Urine: NEGATIVE
GLUCOSE, UA: 50 mg/dL — AB
KETONES UR: NEGATIVE mg/dL
Nitrite: NEGATIVE
PH: 6 (ref 5.0–8.0)
Protein, ur: 100 mg/dL — AB
Specific Gravity, Urine: 1.012 (ref 1.005–1.030)

## 2016-12-21 LAB — CBC
HEMATOCRIT: 36.3 % (ref 35.0–47.0)
HEMOGLOBIN: 11.8 g/dL — AB (ref 12.0–16.0)
MCH: 24.1 pg — ABNORMAL LOW (ref 26.0–34.0)
MCHC: 32.5 g/dL (ref 32.0–36.0)
MCV: 74 fL — AB (ref 80.0–100.0)
Platelets: 308 10*3/uL (ref 150–440)
RBC: 4.91 MIL/uL (ref 3.80–5.20)
RDW: 17.9 % — AB (ref 11.5–14.5)
WBC: 13.6 10*3/uL — AB (ref 3.6–11.0)

## 2016-12-21 LAB — ECHOCARDIOGRAM COMPLETE
Height: 65 in
Weight: 3950.4 oz

## 2016-12-21 MED ORDER — LISINOPRIL 40 MG PO TABS: 40.0000 mg | ORAL_TABLET | Freq: Every day | ORAL | 0 refills | Status: DC

## 2016-12-21 MED ORDER — HYDRALAZINE HCL 100 MG PO TABS: 100.0000 mg | ORAL_TABLET | Freq: Two times a day (BID) | ORAL | 0 refills | Status: DC

## 2016-12-21 MED ORDER — POTASSIUM CHLORIDE CRYS ER 20 MEQ PO TBCR
40.0000 meq | EXTENDED_RELEASE_TABLET | Freq: Once | ORAL | Status: AC
  Administered 2016-12-21: 40 meq via ORAL
  Filled 2016-12-21: qty 2

## 2016-12-21 NOTE — Progress Notes (Signed)
   [place], [place] [address]  December 21, 2016  Patient:  Renee [name] Date of Birth: 30-Nov-1976 Date of Visit:  12/20/2016  To Whom it May Concern:  Please excuse Renee [name] from work from 12/20/2016 until 12/21/16 as she was admitted to the [facility] for medical treatment and has been receiving appropriate care. She may return to work on 12/23/16, sooner if she feels she is able to return sooner than this date.      Please don't hesitate to contact me with questions or concerns by calling  [phone] and asking them to page me directly.   Renee Schultze, MD

## 2016-12-21 NOTE — Discharge Summary (Signed)
[facility] NAME: Renee [name]    MR#:  [id number]  DATE OF BIRTH:  20-Sep-1976  DATE OF ADMISSION:  12/20/2016 ADMITTING PHYSICIAN: Bettey Costa, MD  DATE OF DISCHARGE: 12/21/16  PRIMARY CARE PHYSICIAN: [facility]    ADMISSION DIAGNOSIS:  Hypertensive urgency [I16.0] Acute kidney injury ([place]) [N17.9] Community acquired pneumonia of right lower lobe of lung ([place]) [J18.1]  DISCHARGE DIAGNOSIS:  Active Problems:   Accelerated hypertension   SECONDARY DIAGNOSIS:   Past Medical History:  Diagnosis Date  . Hypertension     HOSPITAL COURSE:  Renee [name]  is a 40 y.o. female admitted 12/20/2016 with chief complaint weakness. Please see H&P performed by Bettey Costa, MD for further information. Patient presented with weakness and shortness of breath not to have markedly elevated blood pressure. Medications adjusted with improvement of blood pressure. Procalcitonin negative, no evidence of pneumonia   DISCHARGE CONDITIONS:   stable  CONSULTS OBTAINED:    DRUG ALLERGIES:  No Known Allergies  DISCHARGE MEDICATIONS:   Current Discharge Medication List    CONTINUE these medications which have CHANGED   Details  !! hydrALAZINE (APRESOLINE) 100 MG tablet Take 1 tablet (100 mg total) by mouth 2 (two) times daily. Qty: 60 tablet, Refills: 0    lisinopril (PRINIVIL,ZESTRIL) 40 MG tablet Take 1 tablet (40 mg total) by mouth daily. Qty: 30 tablet, Refills: 0     !! - Potential duplicate medications found. Please discuss with provider.    CONTINUE these medications which have NOT CHANGED   Details  cloNIDine (CATAPRES) 0.2 MG tablet Take 1 tablet (0.2 mg total) by mouth 2 (two) times daily. Qty: 180 tablet, Refills: 3    !! hydrALAZINE (APRESOLINE) 50 MG tablet Take 1 tablet (50 mg total) by mouth 2 (two) times daily. Qty: 180 tablet, Refills: 3    furosemide (LASIX) 20 MG tablet Take 20 mg by mouth  daily as needed.     labetalol (NORMODYNE) 200 MG tablet Take 200 mg by mouth 2 (two) times daily.     !! - Potential duplicate medications found. Please discuss with provider.    STOP taking these medications     fluticasone (FLONASE) 50 MCG/ACT nasal spray          DISCHARGE INSTRUCTIONS:  Recommend outpatient follow up with nephrology  DIET:  Cardiac diet  DISCHARGE CONDITION:  Stable  ACTIVITY:  Activity as tolerated  OXYGEN:  Home Oxygen: No.   Oxygen Delivery: room air  DISCHARGE LOCATION:  home   If you experience worsening of your admission symptoms, develop shortness of breath, life threatening emergency, suicidal or homicidal thoughts you must seek medical attention immediately by calling 911 or calling your MD immediately  if symptoms less severe.  You Must read complete instructions/literature along with all the possible adverse reactions/side effects for all the Medicines you take and that have been prescribed to you. Take any new Medicines after you have completely understood and accpet all the possible adverse reactions/side effects.   Please note  You were cared for by a hospitalist during your hospital stay. If you have any questions about your discharge medications or the care you received while you were in the hospital after you are discharged, you can call the unit and asked to speak with the hospitalist on call if the hospitalist that took care of you is not available. Once you are discharged, your primary care physician will handle any  further medical issues. Please note that NO REFILLS for any discharge medications will be authorized once you are discharged, as it is imperative that you return to your primary care physician (or establish a relationship with a primary care physician if you do not have one) for your aftercare needs so that they can reassess your need for medications and monitor your lab values.    On the day of Discharge:   VITAL  SIGNS:  Blood pressure 140/70, pulse 73, temperature 98.1 F (36.7 C), temperature source Oral, resp. rate 18, height 5\' 5"  (1.651 m), weight 112.4 kg (247 lb 12.8 oz), last menstrual period 11/14/2016, SpO2 90 %.  I/O:   Intake/Output Summary (Last 24 hours) at 12/21/16 1226 Last data filed at 12/21/16 1109  Gross per 24 hour  Intake              360 ml  Output              100 ml  Net              260 ml    PHYSICAL EXAMINATION:  GENERAL:  40 y.o.-year-old patient lying in the bed with no acute distress.  EYES: Pupils equal, round, reactive to light and accommodation. No scleral icterus. Extraocular muscles intact.  HEENT: Head atraumatic, normocephalic. Oropharynx and nasopharynx clear.  NECK:  Supple, no jugular venous distention. No thyroid enlargement, no tenderness.  LUNGS: Normal breath sounds bilaterally, no wheezing, rales,rhonchi or crepitation. No use of accessory muscles of respiration.  CARDIOVASCULAR: S1, S2 normal. No murmurs, rubs, or gallops.  ABDOMEN: Soft, non-tender, non-distended. Bowel sounds present. No organomegaly or mass.  EXTREMITIES: No pedal edema, cyanosis, or clubbing.  NEUROLOGIC: Cranial nerves II through XII are intact. Muscle strength 5/5 in all extremities. Sensation intact. Gait not checked.  PSYCHIATRIC: The patient is alert and oriented x 3.  SKIN: No obvious rash, lesion, or ulcer.   DATA REVIEW:   CBC  Recent Labs Lab 12/21/16 0424  WBC 13.6*  HGB 11.8*  HCT 36.3  PLT 308    Chemistries   Recent Labs Lab 12/20/16 1006 12/21/16 0424  NA 135 134*  K 3.5 3.3*  CL 103 102  CO2 23 24  GLUCOSE 161* 174*  BUN 23* 33*  CREATININE 1.99* 2.44*  CALCIUM 9.3 8.8*  AST 18  --   ALT 26  --   ALKPHOS 78  --   BILITOT 1.3*  --     Cardiac Enzymes No results for input(s): TROPONINI in the last 168 hours.  Microbiology Results  Results for orders placed or performed during the hospital encounter of 12/20/16  Blood culture  (routine x 2)     Status: None (Preliminary result)   Collection Time: 12/20/16 12:35 PM  Result Value Ref Range Status   Specimen Description BLOOD LEFT WRIST  Final   Special Requests   Final    BOTTLES DRAWN AEROBIC AND ANAEROBIC AER 8ML ANA 8ML   Culture NO GROWTH < 24 HOURS  Final   Report Status PENDING  Incomplete  Blood culture (routine x 2)     Status: None (Preliminary result)   Collection Time: 12/20/16 12:42 PM  Result Value Ref Range Status   Specimen Description BLOOD LEFT AC  Final   Special Requests   Final    BOTTLES DRAWN AEROBIC AND ANAEROBIC AER 7ML ANA 10ML   Culture NO GROWTH < 24 HOURS  Final   Report Status PENDING  Incomplete  RADIOLOGY:  Dg Chest 2 View  Result Date: 12/20/2016 CLINICAL DATA:  Vomiting, chills, cough, and body aches with decreased appetite for the past 4 days. The patient experienced shortness of breath over this past weekend. Nonsmoker. EXAM: CHEST  2 VIEW COMPARISON:  Chest x-ray of March 04, 2015 FINDINGS: The lungs are well-expanded. There is increased density in the right lower lobe. The left lung is clear. The cardiac silhouette is mildly enlarged though stable. There is central pulmonary vascular prominence slightly less conspicuous than on the previous study. The mediastinum is normal in width. The trachea is midline. There is no pleural effusion. The bony thorax exhibits no acute abnormality. IMPRESSION: Atelectasis or pneumonia in the right lower lobe. Chronic cardiomegaly and pulmonary vascular congestion consistent with low-grade CHF. Followup PA and lateral chest X-ray is recommended in 3-4 weeks following trial of antibiotic therapy to ensure resolution of the suspected right lower lobe pneumonia. Electronically Signed   By: Zarian Colpitts  Martinique M.D.   On: 12/20/2016 11:47     Management plans discussed with the patient, family and they are in agreement.  CODE STATUS:     Code Status Orders        Start     Ordered   12/20/16 1221   Full code  Continuous     12/20/16 1221    Code Status History    Date Active Date Inactive Code Status Order ID Comments User Context   This patient has a current code status but no historical code status.      TOTAL TIME TAKING CARE OF THIS PATIENT: 33 minutes.    Renee [name],  Karenann Cai.D on 12/21/2016 at 12:26 PM  Between 7am to 6pm - Pager - [phone]  After 6pm go to [email or web address] - [facility] [facility]  Office  [phone]  CC: Primary care physician; [place]

## 2016-12-21 NOTE — Progress Notes (Signed)
Patient given discharge teaching and paperwork regarding medications, diet, follow-up appointments and activity. Patient understanding verbalized. No complaints at this time. IV and telemetry discontinued prior to leaving. Skin assessment as previously charted and vitals are stable; on room air. Patient being discharged to home. Caregiver/family present during discharge teaching. No further needs by Care Management. Prescriptions sent to pharmacy by MD. Work note printed for patient.

## 2016-12-25 LAB — CULTURE, BLOOD (ROUTINE X 2)
CULTURE: NO GROWTH
CULTURE: NO GROWTH

## 2017-01-14 DIAGNOSIS — R05 Cough: Secondary | ICD-10-CM | POA: Diagnosis not present

## 2017-01-14 DIAGNOSIS — J069 Acute upper respiratory infection, unspecified: Secondary | ICD-10-CM | POA: Diagnosis not present

## 2017-01-16 ENCOUNTER — Other Ambulatory Visit: Payer: Self-pay | Admitting: Family Medicine

## 2017-01-27 ENCOUNTER — Encounter: Payer: Self-pay | Admitting: Internal Medicine

## 2017-01-29 ENCOUNTER — Ambulatory Visit: Payer: Self-pay | Admitting: Internal Medicine

## 2017-01-31 ENCOUNTER — Other Ambulatory Visit: Payer: Self-pay | Admitting: Family Medicine

## 2017-02-10 ENCOUNTER — Other Ambulatory Visit: Payer: Self-pay | Admitting: Family Medicine

## 2017-02-18 ENCOUNTER — Ambulatory Visit: Payer: Self-pay | Admitting: Internal Medicine

## 2017-03-05 ENCOUNTER — Other Ambulatory Visit: Payer: Self-pay | Admitting: Family Medicine

## 2017-03-08 ENCOUNTER — Other Ambulatory Visit: Payer: Self-pay | Admitting: Family Medicine

## 2017-03-12 ENCOUNTER — Ambulatory Visit: Payer: Self-pay | Admitting: Internal Medicine

## 2017-03-15 DIAGNOSIS — J019 Acute sinusitis, unspecified: Secondary | ICD-10-CM | POA: Diagnosis not present

## 2017-06-03 ENCOUNTER — Ambulatory Visit: Payer: Self-pay | Admitting: Internal Medicine

## 2017-06-14 ENCOUNTER — Emergency Department: Payer: Medicare Other

## 2017-06-14 ENCOUNTER — Encounter: Payer: Self-pay | Admitting: *Deleted

## 2017-06-14 ENCOUNTER — Inpatient Hospital Stay
Admission: EM | Admit: 2017-06-14 | Discharge: 2017-06-17 | DRG: 292 | Disposition: A | Discharge status: Home / Self Care | Payer: Medicare Other | Attending: Internal Medicine | Admitting: Internal Medicine

## 2017-06-14 DIAGNOSIS — R1013 Epigastric pain: Secondary | ICD-10-CM | POA: Diagnosis not present

## 2017-06-14 DIAGNOSIS — N289 Disorder of kidney and ureter, unspecified: Secondary | ICD-10-CM

## 2017-06-14 DIAGNOSIS — R1111 Vomiting without nausea: Secondary | ICD-10-CM | POA: Diagnosis not present

## 2017-06-14 DIAGNOSIS — K2900 Acute gastritis without bleeding: Secondary | ICD-10-CM

## 2017-06-14 DIAGNOSIS — N189 Chronic kidney disease, unspecified: Secondary | ICD-10-CM | POA: Diagnosis not present

## 2017-06-14 DIAGNOSIS — E871 Hypo-osmolality and hyponatremia: Secondary | ICD-10-CM | POA: Diagnosis not present

## 2017-06-14 DIAGNOSIS — K6389 Other specified diseases of intestine: Secondary | ICD-10-CM | POA: Diagnosis not present

## 2017-06-14 DIAGNOSIS — N183 Chronic kidney disease, stage 3 (moderate): Secondary | ICD-10-CM | POA: Diagnosis present

## 2017-06-14 DIAGNOSIS — K529 Noninfective gastroenteritis and colitis, unspecified: Secondary | ICD-10-CM | POA: Diagnosis present

## 2017-06-14 DIAGNOSIS — Z79899 Other long term (current) drug therapy: Secondary | ICD-10-CM

## 2017-06-14 DIAGNOSIS — I160 Hypertensive urgency: Secondary | ICD-10-CM | POA: Diagnosis not present

## 2017-06-14 DIAGNOSIS — I5030 Unspecified diastolic (congestive) heart failure: Secondary | ICD-10-CM | POA: Diagnosis not present

## 2017-06-14 DIAGNOSIS — Z9119 Patient's noncompliance with other medical treatment and regimen: Secondary | ICD-10-CM

## 2017-06-14 DIAGNOSIS — I130 Hypertensive heart and chronic kidney disease with heart failure and stage 1 through stage 4 chronic kidney disease, or unspecified chronic kidney disease: Principal | ICD-10-CM | POA: Diagnosis present

## 2017-06-14 DIAGNOSIS — I10 Essential (primary) hypertension: Secondary | ICD-10-CM

## 2017-06-14 DIAGNOSIS — N1330 Unspecified hydronephrosis: Secondary | ICD-10-CM

## 2017-06-14 DIAGNOSIS — I129 Hypertensive chronic kidney disease with stage 1 through stage 4 chronic kidney disease, or unspecified chronic kidney disease: Secondary | ICD-10-CM | POA: Diagnosis not present

## 2017-06-14 DIAGNOSIS — R112 Nausea with vomiting, unspecified: Secondary | ICD-10-CM | POA: Diagnosis not present

## 2017-06-14 DIAGNOSIS — E1165 Type 2 diabetes mellitus with hyperglycemia: Secondary | ICD-10-CM | POA: Diagnosis present

## 2017-06-14 DIAGNOSIS — M7989 Other specified soft tissue disorders: Secondary | ICD-10-CM

## 2017-06-14 DIAGNOSIS — N179 Acute kidney failure, unspecified: Secondary | ICD-10-CM

## 2017-06-14 DIAGNOSIS — I248 Other forms of acute ischemic heart disease: Secondary | ICD-10-CM | POA: Diagnosis not present

## 2017-06-14 DIAGNOSIS — E876 Hypokalemia: Secondary | ICD-10-CM

## 2017-06-14 DIAGNOSIS — Z9111 Patient's noncompliance with dietary regimen: Secondary | ICD-10-CM

## 2017-06-14 DIAGNOSIS — E860 Dehydration: Secondary | ICD-10-CM | POA: Diagnosis present

## 2017-06-14 DIAGNOSIS — Z6841 Body Mass Index (BMI) 40.0 and over, adult: Secondary | ICD-10-CM | POA: Diagnosis not present

## 2017-06-14 DIAGNOSIS — D509 Iron deficiency anemia, unspecified: Secondary | ICD-10-CM | POA: Diagnosis present

## 2017-06-14 DIAGNOSIS — R81 Glycosuria: Secondary | ICD-10-CM | POA: Diagnosis present

## 2017-06-14 DIAGNOSIS — E1122 Type 2 diabetes mellitus with diabetic chronic kidney disease: Secondary | ICD-10-CM | POA: Diagnosis present

## 2017-06-14 DIAGNOSIS — T501X5A Adverse effect of loop [high-ceiling] diuretics, initial encounter: Secondary | ICD-10-CM | POA: Diagnosis present

## 2017-06-14 LAB — COMPREHENSIVE METABOLIC PANEL
ALBUMIN: 3.4 g/dL — AB (ref 3.5–5.0)
ALT: 19 U/L (ref 14–54)
AST: 19 U/L (ref 15–41)
Alkaline Phosphatase: 86 U/L (ref 38–126)
Anion gap: 10 (ref 5–15)
BILIRUBIN TOTAL: 0.8 mg/dL (ref 0.3–1.2)
BUN: 48 mg/dL — AB (ref 6–20)
CO2: 23 mmol/L (ref 22–32)
Calcium: 9 mg/dL (ref 8.9–10.3)
Chloride: 97 mmol/L — ABNORMAL LOW (ref 101–111)
Creatinine, Ser: 2.88 mg/dL — ABNORMAL HIGH (ref 0.44–1.00)
GFR calc Af Amer: 22 mL/min — ABNORMAL LOW (ref >60)
GFR calc non Af Amer: 19 mL/min — ABNORMAL LOW (ref >60)
GLUCOSE: 203 mg/dL — AB (ref 65–99)
POTASSIUM: 3.1 mmol/L — AB (ref 3.5–5.1)
Sodium: 130 mmol/L — ABNORMAL LOW (ref 135–145)
TOTAL PROTEIN: 6.8 g/dL (ref 6.5–8.1)

## 2017-06-14 LAB — CBC WITH DIFFERENTIAL/PLATELET
Basophils Absolute: 0.2 10*3/uL — ABNORMAL HIGH (ref 0–0.1)
Basophils Relative: 1 %
Eosinophils Absolute: 0 10*3/uL (ref 0–0.7)
Eosinophils Relative: 0 %
HEMATOCRIT: 51.7 % — AB (ref 35.0–47.0)
Hemoglobin: 16.9 g/dL — ABNORMAL HIGH (ref 12.0–16.0)
LYMPHS PCT: 7 %
Lymphs Abs: 1.5 10*3/uL (ref 1.0–3.6)
MCH: 23.8 pg — ABNORMAL LOW (ref 26.0–34.0)
MCHC: 32.6 g/dL (ref 32.0–36.0)
MCV: 72.9 fL — AB (ref 80.0–100.0)
MONOS PCT: 3 %
Monocytes Absolute: 0.6 10*3/uL (ref 0.2–0.9)
NEUTROS ABS: 20 10*3/uL — AB (ref 1.4–6.5)
Neutrophils Relative %: 89 %
Platelets: 332 10*3/uL (ref 150–440)
RBC: 7.09 MIL/uL — ABNORMAL HIGH (ref 3.80–5.20)
RDW: 17.7 % — ABNORMAL HIGH (ref 11.5–14.5)
WBC: 22.4 10*3/uL — ABNORMAL HIGH (ref 3.6–11.0)

## 2017-06-14 LAB — LIPASE, BLOOD: Lipase: 19 U/L (ref 11–51)

## 2017-06-14 LAB — POCT PREGNANCY, URINE: Preg Test, Ur: NEGATIVE

## 2017-06-14 LAB — TROPONIN I: Troponin I: 0.06 ng/mL (ref <0.03)

## 2017-06-14 LAB — LACTIC ACID, PLASMA: Lactic Acid, Venous: 1.1 mmol/L (ref 0.5–1.9)

## 2017-06-14 MED ORDER — HYDRALAZINE HCL 50 MG PO TABS
ORAL_TABLET | ORAL | Status: AC
  Filled 2017-06-14: qty 2

## 2017-06-14 MED ORDER — LABETALOL HCL 5 MG/ML IV SOLN
10.0000 mg | Freq: Once | INTRAVENOUS | Status: AC
  Administered 2017-06-14: 10 mg via INTRAVENOUS

## 2017-06-14 MED ORDER — SODIUM CHLORIDE 0.9 % IV BOLUS (SEPSIS)
1000.0000 mL | Freq: Once | INTRAVENOUS | Status: AC
  Administered 2017-06-14: 1000 mL via INTRAVENOUS

## 2017-06-14 MED ORDER — ONDANSETRON HCL 4 MG/2ML IJ SOLN
4.0000 mg | Freq: Once | INTRAMUSCULAR | Status: AC
  Administered 2017-06-14: 4 mg via INTRAVENOUS
  Filled 2017-06-14: qty 2

## 2017-06-14 MED ORDER — CLONIDINE HCL 0.1 MG PO TABS
ORAL_TABLET | ORAL | Status: AC
  Filled 2017-06-14: qty 2

## 2017-06-14 MED ORDER — IOPAMIDOL (ISOVUE-300) INJECTION 61%
30.0000 mL | Freq: Once | INTRAVENOUS | Status: AC | PRN
Start: 2017-06-14 — End: 2017-06-14
  Administered 2017-06-14: 30 mL via ORAL

## 2017-06-14 MED ORDER — MORPHINE SULFATE (PF) 4 MG/ML IV SOLN
4.0000 mg | Freq: Once | INTRAVENOUS | Status: AC
  Administered 2017-06-14: 4 mg via INTRAVENOUS
  Filled 2017-06-14: qty 1

## 2017-06-14 MED ORDER — LABETALOL HCL 200 MG PO TABS
200.0000 mg | ORAL_TABLET | Freq: Two times a day (BID) | ORAL | Status: DC
  Administered 2017-06-14 – 2017-06-16 (×5): 200 mg via ORAL
  Filled 2017-06-14 (×6): qty 1

## 2017-06-14 MED ORDER — LABETALOL HCL 5 MG/ML IV SOLN
10.0000 mg | Freq: Once | INTRAVENOUS | Status: AC
  Administered 2017-06-14: 10 mg via INTRAVENOUS
  Filled 2017-06-14: qty 4

## 2017-06-14 MED ORDER — CLONIDINE HCL 0.1 MG PO TABS
0.2000 mg | ORAL_TABLET | Freq: Two times a day (BID) | ORAL | Status: DC
  Administered 2017-06-14 – 2017-06-16 (×5): 0.2 mg via ORAL
  Filled 2017-06-14 (×4): qty 2

## 2017-06-14 MED ORDER — HYDRALAZINE HCL 50 MG PO TABS
100.0000 mg | ORAL_TABLET | Freq: Two times a day (BID) | ORAL | Status: DC
  Administered 2017-06-14: 100 mg via ORAL
  Administered 2017-06-15: 50 mg via ORAL
  Administered 2017-06-16 (×2): 100 mg via ORAL
  Filled 2017-06-14 (×4): qty 2

## 2017-06-14 MED ORDER — LABETALOL HCL 5 MG/ML IV SOLN
INTRAVENOUS | Status: AC
  Filled 2017-06-14: qty 4

## 2017-06-14 MED ORDER — PIPERACILLIN-TAZOBACTAM 3.375 G IVPB
3.3750 g | Freq: Three times a day (TID) | INTRAVENOUS | Status: DC
  Administered 2017-06-14 – 2017-06-17 (×8): 3.375 g via INTRAVENOUS
  Filled 2017-06-14 (×10): qty 50

## 2017-06-14 NOTE — ED Triage Notes (Signed)
Patient states she has been vomiting since yesterday and vomited up blood pressure meds. Patient states she ran out of two of her blood pressure medications yesterday.

## 2017-06-14 NOTE — Progress Notes (Signed)
Pharmacy Antibiotic Note  Renee [name] is a 41 y.o. female admitted on 06/14/2017 with intra-abdominal infection.  Pharmacy has been consulted for Zosyn dosing.  Plan: Zosyn 3.375g IV q8h (4 hour infusion).  Height: 5\' 4"  (162.6 cm) Weight: 250 lb (113.4 kg) IBW/kg (Calculated) : 54.7  Temp (24hrs), Avg:97.9 F (36.6 C), Min:97.9 F (36.6 C), Max:97.9 F (36.6 C)   Recent Labs Lab 06/14/17 1503  WBC 22.4*  CREATININE 2.88*    Estimated Creatinine Clearance: 31.4 mL/min (A) (by C-G formula based on SCr of 2.88 mg/dL (H)).    No Known Allergies  Antimicrobials this admission: Zosyn 6/15 >>    >>   Dose adjustments this admission:   Microbiology results: No micro   Thank you for allowing pharmacy to be a part of this patient's care.  Yaneliz Radebaugh S 06/14/2017 9:58 PM

## 2017-06-14 NOTE — ED Notes (Signed)
Pt placed on heart monitor due to elevated troponin

## 2017-06-14 NOTE — ED Provider Notes (Signed)
[facility] Emergency Department Provider Note   ____________________________________________    I have reviewed the triage vital signs and the nursing notes.   HISTORY  Chief Complaint Abdominal pain nausea and vomiting elevated blood pressure    HPI Renee [name] is a 41 y.o. female who presents with complaints of abdominal pain nausea and vomiting. Patient reports 2 days of nausea and vomiting and upper abdominal pain. She describes a burning sensation which is mild to moderate. She reports she has a history of kidney disease and hypertension and diabetes and has not been able to tolerate her by mouth medications. She denies fevers or chills. No recent travel. No sick contacts reported.   Past Medical History:  Diagnosis Date  . Hypertension     Patient Active Problem List   Diagnosis Date Noted  . Cardiomyopathy ([place]) 04/08/2015  . Chronic kidney disease (CKD), stage III (moderate) 04/08/2015  . Accelerated hypertension 04/08/2015  . Diabetes mellitus, type 2 ([place]) 04/08/2015    Past Surgical History:  Procedure Laterality Date  . CESAREAN SECTION    . TUBAL LIGATION      Prior to Admission medications   Medication Sig Start Date End Date Taking? Authorizing Provider  cloNIDine (CATAPRES) 0.2 MG tablet Take 1 tablet (0.2 mg total) by mouth 2 (two) times daily. 12/14/15  Yes Plonk, Gwyndolyn Saxon, MD  hydrALAZINE (APRESOLINE) 100 MG tablet Take 1 tablet (100 mg total) by mouth 2 (two) times daily. 12/21/16 06/14/17 Yes Hower, Aaron Mose, MD  labetalol (NORMODYNE) 200 MG tablet Take 200 mg by mouth 2 (two) times daily.   Yes [provider]  lisinopril (PRINIVIL,ZESTRIL) 40 MG tablet Take 1 tablet (40 mg total) by mouth daily. 12/22/16  Yes Hower, Aaron Mose, MD  furosemide (LASIX) 20 MG tablet Take 20 mg by mouth daily as needed.     [provider]  hydrALAZINE (APRESOLINE) 50 MG tablet Take 1 tablet (50 mg total) by mouth 2 (two)  times daily. Patient not taking: Reported on 06/14/2017 01/31/16   Adline Potter, MD     Allergies Patient has no known allergies.  No family history on file.  Social History Social History  Substance Use Topics  . Smoking status: Never Smoker  . Smokeless tobacco: Never Used  . Alcohol use No    Review of Systems  Constitutional: No fever/chills Eyes: No visual changes.  ENT: No sore throat. Cardiovascular: Denies chest pain. Respiratory: Denies shortness of breath. Gastrointestinal: As above   Genitourinary: Negative for dysuria. Musculoskeletal: Negative for back pain. Skin: Negative for rash. Neurological: Negative for headaches or weakness   ____________________________________________   PHYSICAL EXAM:  VITAL SIGNS: ED Triage Vitals  Enc Vitals Group     BP 06/14/17 1404 (!) 256/139     Pulse Rate 06/14/17 1404 73     Resp 06/14/17 1404 18     Temp 06/14/17 1404 97.9 F (36.6 C)     Temp Source 06/14/17 1404 Oral     SpO2 06/14/17 1404 97 %     Weight 06/14/17 1407 113.4 kg (250 lb)     Height 06/14/17 1407 1.626 m (5\' 4" )     Head Circumference --      Peak Flow --      Pain Score 06/14/17 1406 9     Pain Loc --      Pain Edu? --      Excl. in [place]? --     Constitutional: Alert and  oriented.  Eyes: Conjunctivae are normal.  Head: Atraumatic. Nose: No congestion/rhinnorhea. Mouth/Throat: Mucous membranes are moist.    Cardiovascular: Normal rate, regular rhythm. Grossly normal heart sounds.  Good peripheral circulation. Respiratory: Normal respiratory effort.  No retractions. Lungs CTAB. Gastrointestinal: Tenderness to palpation epigastrium. No distention.  No CVA tenderness. Genitourinary: deferred Musculoskeletal: No lower extremity tenderness nor edema.  Warm and well perfused Neurologic:  Normal speech and language. No gross focal neurologic deficits are appreciated.  Skin:  Skin is warm, dry and intact. No rash noted. Psychiatric: Mood and  affect are normal. Speech and behavior are normal.  ____________________________________________   LABS (all labs ordered are listed, but only abnormal results are displayed)  Labs Reviewed  COMPREHENSIVE METABOLIC PANEL - Abnormal; Notable for the following:       Result Value   Sodium 130 (*)    Potassium 3.1 (*)    Chloride 97 (*)    Glucose, Bld 203 (*)    BUN 48 (*)    Creatinine, Ser 2.88 (*)    Albumin 3.4 (*)    GFR calc non Af Amer 19 (*)    GFR calc Af Amer 22 (*)    All other components within normal limits  CBC WITH DIFFERENTIAL/PLATELET - Abnormal; Notable for the following:    WBC 22.4 (*)    RBC 7.09 (*)    Hemoglobin 16.9 (*)    HCT 51.7 (*)    MCV 72.9 (*)    MCH 23.8 (*)    RDW 17.7 (*)    Neutro Abs 20.0 (*)    Basophils Absolute 0.2 (*)    All other components within normal limits  TROPONIN I - Abnormal; Notable for the following:    Troponin I 0.06 (*)    All other components within normal limits  LIPASE, BLOOD  POCT PREGNANCY, URINE   ____________________________________________  EKG  ED ECG REPORT I, Lavonia Drafts, the attending physician, personally viewed and interpreted this ECG.  Date: 06/14/2017  Rate: 95 Rhythm: normal sinus rhythm QRS Axis: Left axis deviation Intervals: normal ST/T Wave abnormalities: Nonspecific changes   ____________________________________________  RADIOLOGY  CT abdomen is consistent with enteritis ____________________________________________   PROCEDURES  Procedure(s) performed: No    Critical Care performed: No ____________________________________________   INITIAL IMPRESSION / ASSESSMENT AND PLAN / ED COURSE  Pertinent labs & imaging results that were available during my care of the patient were reviewed by me and considered in my medical decision making (see chart for details).  Patient presents with nausea vomiting and epigastric pain. Her blood pressure is markedly elevated likely  that she has not been able to take her by mouth medications. Her lab work demonstrates acute on chronic kidney injury. Her white count is significant elevated. Given this we will obtain CT imaging.  CT scan is reassuring, likely viral enteritis. However blood pressure has not responded significantly to multiple doses of IV labetalol, we'll discuss with hospitalist for admission.    ____________________________________________   FINAL CLINICAL IMPRESSION(S) / ED DIAGNOSES  Final diagnoses:  Hypertensive urgency  Acute on chronic renal insufficiency  Non-intractable vomiting with nausea, unspecified vomiting type  Epigastric pain      NEW MEDICATIONS STARTED DURING THIS VISIT:  New Prescriptions   No medications on file     Note:  This document was prepared using Dragon voice recognition software and may include unintentional dictation errors.    Lavonia Drafts, MD 06/14/17 1945

## 2017-06-14 NOTE — ED Notes (Signed)
Patient resting comfortably. Patient is sitting in the bed, did wake her up when I walked into the room. Patient denies any pain other take every once in a while she has some abdominal pain. Patient able to hold water and ginger ale down at this time.

## 2017-06-14 NOTE — ED Notes (Signed)
One unsuccessful IV attempt.

## 2017-06-14 NOTE — H&P (Signed)
[place] [facility] [place] NAME: Renee [name]    MR#:  [id number]  DATE OF BIRTH:  1976/09/17  DATE OF ADMISSION:  06/14/2017  PRIMARY CARE PHYSICIAN: Shari Prows, [facility]   REQUESTING/REFERRING PHYSICIAN:   CHIEF COMPLAINT:   Chief Complaint  Patient presents with  . Hypertension    HISTORY OF PRESENT ILLNESS: Renee [name]  is a 41 y.o. female with a known history of Essential hypertension, iron deficiency anemia, CK D, who has not taken In her blood pressure medications for the past 2 or 3 days, due to change of physician, presents to the hospital with complaints of 2 day history of nausea, vomiting, epigastric abdominal pain, markedly elevated blood pressure. According to the patient, she switched physicians, however, her medications did not get continued, she ran out of them approximately 3 years ago. Yesterday she started having nausea and vomiting as well as epigastric abdominal pain, blood pressure was measured at 762 and above systolic. She presented to the hospital for further evaluation and treatment, received a few doses of labetalol with no significant improvement of blood pressure yet read. She feels, however, somewhat better now, still complains of some epigastric abdominal pain. Denies any chest pain. The pain was described as 10 out of 10, constant, worsening with food intake. Patient denies any diarrhea, no blood in stool or vomitus, no tarry or black stool. CT of abdomen and pelvis revealed thickened loops of proximal jejunum and inflammatory changes of mesentery. Hospitalist service were contacted for admission  PAST MEDICAL HISTORY:   Past Medical History:  Diagnosis Date  . Hypertension     PAST SURGICAL HISTORY: Past Surgical History:  Procedure Laterality Date  . CESAREAN SECTION    . TUBAL LIGATION      SOCIAL HISTORY:  Social History  Substance Use Topics  . Smoking status: Never Smoker  .  Smokeless tobacco: Never Used  . Alcohol use No    FAMILY HISTORY: Patient's mother had congestive heart failure, diabetes and hypertension. Patient's single, used to be a Music therapist, now unemployed. No history of smoking. Occasional ocular views, no substance abuse  DRUG ALLERGIES: No Known Allergies  Review of Systems  Constitutional: Positive for chills. Negative for fever and weight loss.  HENT: Negative for congestion.   Eyes: Negative for blurred vision and double vision.  Respiratory: Negative for cough, sputum production, shortness of breath and wheezing.   Cardiovascular: Negative for chest pain, palpitations, orthopnea, leg swelling and PND.  Gastrointestinal: Positive for abdominal pain, nausea and vomiting. Negative for blood in stool, constipation and diarrhea.  Genitourinary: Negative for dysuria, frequency, hematuria and urgency.  Musculoskeletal: Negative for falls.  Neurological: Negative for dizziness, tremors, focal weakness and headaches.  Endo/Heme/Allergies: Does not bruise/bleed easily.  Psychiatric/Behavioral: Negative for depression. The patient does not have insomnia.     MEDICATIONS AT HOME:  Prior to Admission medications   Medication Sig Start Date End Date Taking? Authorizing Provider  cloNIDine (CATAPRES) 0.2 MG tablet Take 1 tablet (0.2 mg total) by mouth 2 (two) times daily. 12/14/15  Yes Plonk, Gwyndolyn Saxon, MD  hydrALAZINE (APRESOLINE) 100 MG tablet Take 1 tablet (100 mg total) by mouth 2 (two) times daily. 12/21/16 06/14/17 Yes Hower, Aaron Mose, MD  labetalol (NORMODYNE) 200 MG tablet Take 200 mg by mouth 2 (two) times daily.   Yes [provider]  lisinopril (PRINIVIL,ZESTRIL) 40 MG tablet Take 1 tablet (40 mg total) by mouth daily. 12/22/16  Yes Hower, Aaron Mose, MD  furosemide (LASIX) 20 MG tablet Take 20 mg by mouth daily as needed.     [provider]  hydrALAZINE (APRESOLINE) 50 MG tablet Take 1 tablet (50 mg total) by mouth 2 (two)  times daily. Patient not taking: Reported on 06/14/2017 01/31/16   Adline Potter, MD      PHYSICAL EXAMINATION:   VITAL SIGNS: Blood pressure (!) 226/116, pulse 69, temperature 97.9 F (36.6 C), temperature source Oral, resp. rate 20, height 5\' 4"  (1.626 m), weight 113.4 kg (250 lb), last menstrual period 06/07/2017, SpO2 96 %.  GENERAL:  41 y.o.-year-old patient lying in the bed in mild distress due to nausea.  EYES: Pupils equal, round, reactive to light and accommodation. No scleral icterus. Extraocular muscles intact.  HEENT: Head atraumatic, normocephalic. Oropharynx and nasopharynx clear.  NECK:  Supple, no jugular venous distention. No thyroid enlargement, no tenderness.  LUNGS: Normal breath sounds bilaterally, no wheezing, rales,rhonchi or crepitation. No use of accessory muscles of respiration.  CARDIOVASCULAR: S1, S2 normal. No murmurs, rubs, or gallops.  ABDOMEN: Soft, epigastric abdominal tenderness to palpation, no rebound or guarding, nondistended. Bowel sounds present. No organomegaly or mass.  EXTREMITIES: 2. Left lower extremity and pedal edema,no cyanosis, or clubbing.  NEUROLOGIC: Cranial nerves II through XII are intact. Muscle strength 5/5 in all extremities. Sensation intact. Gait not checked.  PSYCHIATRIC: The patient is alert and oriented x 3.  SKIN: No obvious rash, lesion, or ulcer.   LABORATORY PANEL:   CBC  Recent Labs Lab 06/14/17 1503  WBC 22.4*  HGB 16.9*  HCT 51.7*  PLT 332  MCV 72.9*  MCH 23.8*  MCHC 32.6  RDW 17.7*  LYMPHSABS 1.5  MONOABS 0.6  EOSABS 0.0  BASOSABS 0.2*   ------------------------------------------------------------------------------------------------------------------  Chemistries   Recent Labs Lab 06/14/17 1503  NA 130*  K 3.1*  CL 97*  CO2 23  GLUCOSE 203*  BUN 48*  CREATININE 2.88*  CALCIUM 9.0  AST 19  ALT 19  ALKPHOS 86  BILITOT 0.8    ------------------------------------------------------------------------------------------------------------------  Cardiac Enzymes  Recent Labs Lab 06/14/17 1503  TROPONINI 0.06*   ------------------------------------------------------------------------------------------------------------------  RADIOLOGY: Ct Abdomen Pelvis Wo Contrast  Result Date: 06/14/2017 CLINICAL DATA:  Vomiting since yesterday. EXAM: CT ABDOMEN AND PELVIS WITHOUT CONTRAST TECHNIQUE: Multidetector CT imaging of the abdomen and pelvis was performed following the standard protocol without IV contrast. COMPARISON:  None. FINDINGS: Lower chest: The lung bases are clear of acute process. No pleural effusion or pulmonary lesions. The heart is borderline enlarged. No pericardial effusion. The distal esophagus and aorta are unremarkable. Hepatobiliary: No focal hepatic lesions or intrahepatic biliary dilatation. The gallbladder is grossly normal. No common bile duct dilatation. Pancreas: No mass, inflammation or ductal dilatation. Spleen: Normal size.  No focal lesions. Adrenals/Urinary Tract: The adrenal glands and kidneys are unremarkable. No renal, ureteral or bladder calculi or mass. Stomach/Bowel: The stomach cough duodenum bulb in C-loop appear normal. There is moderate wall thickening and inflammation surrounding the proximal loops of jejunum with edema in the small bowel mesenteric. Findings likely due to an inflammatory or infectious process. Feeding distal small bowel and colon are unremarkable. Scattered radiodensities throughout the small bowel and colon possibly due to ingested material such as Pepto-Bismol. The terminal ileum and appendix are normal. Vascular/Lymphatic: The aorta is normal in caliber. No atherosclerotic calcifications. Small scattered mesenteric and retroperitoneal lymph nodes but no mass or overt adenopathy. Reproductive: The uterus and ovaries are unremarkable. There is a  small cyst associated with  the right ovary. Other: No pelvic mass or adenopathy. No free pelvic fluid collections. No inguinal mass or adenopathy. No abdominal wall hernia or subcutaneous lesions. Musculoskeletal: No significant bony findings. IMPRESSION: 1. Thickened at loops of proximal jejunum and mild inflammatory changes in the adjacent mesenteric likely due to un inflammatory or infectious process. No obstruction. 2. No other significant abdominal/pelvic findings. Electronically Signed   By: Marijo Sanes M.D.   On: 06/14/2017 19:07    EKG: Orders placed or performed during the hospital encounter of 06/14/17  . ED EKG  . ED EKG   EKG in the emergency room revealed normal sinus rhythm at 95 bpm, left axis deviation, biatrial enlargement, pulmonary disease pattern, LVH with repolarization abnormalities, prolonged QTC to 505 ms    IMPRESSION AND PLAN:  Active Problems:   Malignant essential hypertension   Acute gastritis   Nausea and vomiting   Acute on chronic renal failure (HCC)   Hyponatremia   Hypokalemia   Leg swelling   #1. Malignant essential hypertension, resume all home medications, add labetalol intravenously as needed, follow  And advanced medications as needed #2. Acute gastritis, proximal jejunitis, initiate patient on PPI, clear liquid diet, Zosyn, advance diet as tolerated #3. Hyponatremia and fluid overloaded patient, continue Lasix, follow kidney function closely #4. Acute on chronic renal failure with underlying CK D stage IV? Get nephrologist involved for recommendations, follow with diuresis #5. Leukocytosis, follow with antibiotic therapy  #6. Elevated troponin, likely demand ischemia, follow troponin every 4 hours 3, continue patient on labetalol, aspirin, nitroglycerin, get cardiologist involved recommendations, get echo      All the records are reviewed and case discussed with ED provider. Management plans discussed with the patient, family and they are in agreement.  CODE  STATUS: Code Status History    Date Active Date Inactive Code Status Order ID Comments User Context   12/20/2016 12:21 PM 12/21/2016  5:53 PM Full Code [id number]  Bettey Costa, MD ED       TOTAL TIME TAKING CARE OF THIS PATIENT: 60 minutes.    Theodoro Grist M.D on 06/14/2017 at 9:07 PM  Between 7am to 6pm - Pager - [phone] After 6pm go to [email or web address] - password [email or web address] [facility]  [facility]  Office  [phone]  CC: Primary care physician; [facility]

## 2017-06-15 ENCOUNTER — Observation Stay: Payer: Medicare Other

## 2017-06-15 ENCOUNTER — Observation Stay
Admit: 2017-06-15 | Discharge: 2017-06-15 | Disposition: A | Discharge status: Home / Self Care | Payer: Medicare Other | Attending: Internal Medicine | Admitting: Internal Medicine

## 2017-06-15 DIAGNOSIS — E871 Hypo-osmolality and hyponatremia: Secondary | ICD-10-CM | POA: Diagnosis not present

## 2017-06-15 DIAGNOSIS — N200 Calculus of kidney: Secondary | ICD-10-CM | POA: Diagnosis not present

## 2017-06-15 DIAGNOSIS — I129 Hypertensive chronic kidney disease with stage 1 through stage 4 chronic kidney disease, or unspecified chronic kidney disease: Secondary | ICD-10-CM | POA: Diagnosis not present

## 2017-06-15 DIAGNOSIS — N179 Acute kidney failure, unspecified: Secondary | ICD-10-CM | POA: Diagnosis not present

## 2017-06-15 DIAGNOSIS — R7989 Other specified abnormal findings of blood chemistry: Secondary | ICD-10-CM | POA: Diagnosis not present

## 2017-06-15 DIAGNOSIS — E1122 Type 2 diabetes mellitus with diabetic chronic kidney disease: Secondary | ICD-10-CM | POA: Diagnosis not present

## 2017-06-15 DIAGNOSIS — I10 Essential (primary) hypertension: Secondary | ICD-10-CM | POA: Diagnosis not present

## 2017-06-15 DIAGNOSIS — R809 Proteinuria, unspecified: Secondary | ICD-10-CM | POA: Diagnosis not present

## 2017-06-15 DIAGNOSIS — E876 Hypokalemia: Secondary | ICD-10-CM | POA: Diagnosis not present

## 2017-06-15 DIAGNOSIS — N184 Chronic kidney disease, stage 4 (severe): Secondary | ICD-10-CM | POA: Diagnosis not present

## 2017-06-15 DIAGNOSIS — R109 Unspecified abdominal pain: Secondary | ICD-10-CM | POA: Diagnosis not present

## 2017-06-15 LAB — BASIC METABOLIC PANEL
Anion gap: 8 (ref 5–15)
BUN: 50 mg/dL — AB (ref 6–20)
CHLORIDE: 98 mmol/L — AB (ref 101–111)
CO2: 24 mmol/L (ref 22–32)
CREATININE: 2.9 mg/dL — AB (ref 0.44–1.00)
Calcium: 8.2 mg/dL — ABNORMAL LOW (ref 8.9–10.3)
GFR calc Af Amer: 22 mL/min — ABNORMAL LOW (ref >60)
GFR calc non Af Amer: 19 mL/min — ABNORMAL LOW (ref >60)
GLUCOSE: 169 mg/dL — AB (ref 65–99)
POTASSIUM: 3.3 mmol/L — AB (ref 3.5–5.1)
Sodium: 130 mmol/L — ABNORMAL LOW (ref 135–145)

## 2017-06-15 LAB — ECHOCARDIOGRAM COMPLETE
HEIGHTINCHES: 64 in
WEIGHTICAEL: 4014.14 [oz_av]

## 2017-06-15 LAB — TROPONIN I
Troponin I: 0.06 ng/mL (ref <0.03)
Troponin I: 0.06 ng/mL (ref <0.03)

## 2017-06-15 LAB — TSH: TSH: 1.469 u[IU]/mL (ref 0.350–4.500)

## 2017-06-15 LAB — CBC
HEMATOCRIT: 41.5 % (ref 35.0–47.0)
Hemoglobin: 13.7 g/dL (ref 12.0–16.0)
MCH: 23.9 pg — AB (ref 26.0–34.0)
MCHC: 33.1 g/dL (ref 32.0–36.0)
MCV: 72.3 fL — AB (ref 80.0–100.0)
PLATELETS: 311 10*3/uL (ref 150–440)
RBC: 5.74 MIL/uL — ABNORMAL HIGH (ref 3.80–5.20)
RDW: 17.6 % — AB (ref 11.5–14.5)
WBC: 16.8 10*3/uL — ABNORMAL HIGH (ref 3.6–11.0)

## 2017-06-15 LAB — MAGNESIUM
Magnesium: 1.8 mg/dL (ref 1.7–2.4)
Magnesium: 1.9 mg/dL (ref 1.7–2.4)

## 2017-06-15 LAB — GLUCOSE, CAPILLARY: GLUCOSE-CAPILLARY: 141 mg/dL — AB (ref 65–99)

## 2017-06-15 LAB — LACTIC ACID, PLASMA: Lactic Acid, Venous: 1.8 mmol/L (ref 0.5–1.9)

## 2017-06-15 LAB — BRAIN NATRIURETIC PEPTIDE: B Natriuretic Peptide: 462 pg/mL — ABNORMAL HIGH (ref 0.0–100.0)

## 2017-06-15 MED ORDER — ACETAMINOPHEN 325 MG PO TABS: 650.0000 mg | ORAL_TABLET | Freq: Four times a day (QID) | ORAL | Status: DC | PRN

## 2017-06-15 MED ORDER — CLONIDINE HCL 0.1 MG PO TABS: 0.2000 mg | ORAL_TABLET | Freq: Two times a day (BID) | ORAL | Status: DC

## 2017-06-15 MED ORDER — ONDANSETRON HCL 4 MG/2ML IJ SOLN: 4.0000 mg | Freq: Four times a day (QID) | INTRAMUSCULAR | Status: DC | PRN

## 2017-06-15 MED ORDER — HYDRALAZINE HCL 50 MG PO TABS
100.0000 mg | ORAL_TABLET | Freq: Two times a day (BID) | ORAL | Status: DC
  Administered 2017-06-15: 100 mg via ORAL
  Filled 2017-06-15: qty 2

## 2017-06-15 MED ORDER — POTASSIUM CHLORIDE CRYS ER 20 MEQ PO TBCR
20.0000 meq | EXTENDED_RELEASE_TABLET | Freq: Every day | ORAL | Status: DC
  Filled 2017-06-15: qty 1

## 2017-06-15 MED ORDER — SODIUM CHLORIDE 0.9% FLUSH: 3.0000 mL | INTRAVENOUS | Status: DC | PRN

## 2017-06-15 MED ORDER — PANTOPRAZOLE SODIUM 40 MG PO TBEC
40.0000 mg | DELAYED_RELEASE_TABLET | Freq: Every day | ORAL | Status: DC
  Administered 2017-06-15 – 2017-06-16 (×2): 40 mg via ORAL
  Filled 2017-06-15 (×2): qty 1

## 2017-06-15 MED ORDER — ACETAMINOPHEN 650 MG RE SUPP: 650.0000 mg | Freq: Four times a day (QID) | RECTAL | Status: DC | PRN

## 2017-06-15 MED ORDER — LABETALOL HCL 200 MG PO TABS: 200.0000 mg | ORAL_TABLET | Freq: Two times a day (BID) | ORAL | Status: DC

## 2017-06-15 MED ORDER — HYDRALAZINE HCL 20 MG/ML IJ SOLN
10.0000 mg | INTRAMUSCULAR | Status: DC | PRN
  Administered 2017-06-15 (×2): 10 mg via INTRAVENOUS
  Filled 2017-06-15 (×2): qty 1

## 2017-06-15 MED ORDER — SODIUM CHLORIDE 0.9% FLUSH
3.0000 mL | Freq: Two times a day (BID) | INTRAVENOUS | Status: DC
  Administered 2017-06-15 – 2017-06-16 (×5): 3 mL via INTRAVENOUS

## 2017-06-15 MED ORDER — LABETALOL HCL 5 MG/ML IV SOLN
10.0000 mg | INTRAVENOUS | Status: DC | PRN
  Administered 2017-06-15: 10 mg via INTRAVENOUS
  Filled 2017-06-15: qty 4

## 2017-06-15 MED ORDER — POTASSIUM CHLORIDE CRYS ER 20 MEQ PO TBCR
30.0000 meq | EXTENDED_RELEASE_TABLET | Freq: Once | ORAL | Status: AC
  Administered 2017-06-15: 30 meq via ORAL
  Filled 2017-06-15: qty 1

## 2017-06-15 MED ORDER — FUROSEMIDE 10 MG/ML IJ SOLN
40.0000 mg | Freq: Every day | INTRAMUSCULAR | Status: DC
  Administered 2017-06-15: 40 mg via INTRAVENOUS
  Filled 2017-06-15: qty 4

## 2017-06-15 MED ORDER — ONDANSETRON HCL 4 MG PO TABS: 4.0000 mg | ORAL_TABLET | Freq: Four times a day (QID) | ORAL | Status: DC | PRN

## 2017-06-15 MED ORDER — SODIUM CHLORIDE 0.9 % IV SOLN: 250.0000 mL | INTRAVENOUS | Status: DC | PRN

## 2017-06-15 MED ORDER — NITROGLYCERIN 2 % TD OINT
1.0000 [in_us] | TOPICAL_OINTMENT | Freq: Four times a day (QID) | TRANSDERMAL | Status: DC
  Administered 2017-06-15 – 2017-06-16 (×6): 1 [in_us] via TOPICAL
  Filled 2017-06-15 (×8): qty 1

## 2017-06-15 MED ORDER — POTASSIUM CHLORIDE CRYS ER 20 MEQ PO TBCR
40.0000 meq | EXTENDED_RELEASE_TABLET | Freq: Once | ORAL | Status: AC
  Administered 2017-06-15: 40 meq via ORAL
  Filled 2017-06-15: qty 2

## 2017-06-15 MED ORDER — HEPARIN SODIUM (PORCINE) 5000 UNIT/ML IJ SOLN
5000.0000 [IU] | Freq: Three times a day (TID) | INTRAMUSCULAR | Status: DC
  Administered 2017-06-15 – 2017-06-17 (×7): 5000 [IU] via SUBCUTANEOUS
  Filled 2017-06-15 (×7): qty 1

## 2017-06-15 MED ORDER — ASPIRIN EC 81 MG PO TBEC
81.0000 mg | DELAYED_RELEASE_TABLET | Freq: Every day | ORAL | Status: DC
  Administered 2017-06-15 – 2017-06-16 (×2): 81 mg via ORAL
  Filled 2017-06-15 (×2): qty 1

## 2017-06-15 MED ORDER — ALUM & MAG HYDROXIDE-SIMETH 200-200-20 MG/5ML PO SUSP
30.0000 mL | Freq: Four times a day (QID) | ORAL | Status: DC | PRN
  Administered 2017-06-15: 30 mL via ORAL
  Filled 2017-06-15: qty 30

## 2017-06-15 NOTE — Consult Note (Signed)
Referring Provider: Dr. Benjie Karvonen Primary Care Physician:  [facility] Primary Gastroenterologist:  Althia Forts  Reason for Consultation:  Abdominal pain  HPI: Renee [name] is a 41 y.o. female with multiple medical problems admitted for hypertensive urgency seen for a consult due to abdominal pain. States that epigastric pain started 2 days ago. Pain was 10/10 in intensity and was worse with eating. Had nausea and vomiting as well. Denies diarrhea, hematemesis, hematochezia. BP 226/116 on admit yesterday. CT showed proximal jejunitis with mild inflammation in the adjacent mesentery. Denies previous EGD or colonoscopy. Multiple family members in the room.   Past Medical History:  Diagnosis Date  . Hypertension     Past Surgical History:  Procedure Laterality Date  . CESAREAN SECTION    . TUBAL LIGATION      Prior to Admission medications   Medication Sig Start Date End Date Taking? Authorizing Provider  cloNIDine (CATAPRES) 0.2 MG tablet Take 1 tablet (0.2 mg total) by mouth 2 (two) times daily. 12/14/15  Yes Plonk, Gwyndolyn Saxon, MD  hydrALAZINE (APRESOLINE) 100 MG tablet Take 1 tablet (100 mg total) by mouth 2 (two) times daily. 12/21/16 06/14/17 Yes Hower, Aaron Mose, MD  labetalol (NORMODYNE) 200 MG tablet Take 200 mg by mouth 2 (two) times daily.   Yes [provider]  lisinopril (PRINIVIL,ZESTRIL) 40 MG tablet Take 1 tablet (40 mg total) by mouth daily. 12/22/16  Yes Hower, Aaron Mose, MD  furosemide (LASIX) 20 MG tablet Take 20 mg by mouth daily as needed.     [provider]  hydrALAZINE (APRESOLINE) 50 MG tablet Take 1 tablet (50 mg total) by mouth 2 (two) times daily. Patient not taking: Reported on 06/14/2017 01/31/16   Adline Potter, MD    Scheduled Meds: . aspirin EC  81 mg Oral Daily  . cloNIDine  0.2 mg Oral BID  . furosemide  40 mg Intravenous Daily  . heparin  5,000 Units Subcutaneous Q8H  . hydrALAZINE  100 mg Oral BID  . labetalol  200 mg Oral BID   . nitroGLYCERIN  1 inch Topical Q6H  . pantoprazole  40 mg Oral Daily  . sodium chloride flush  3 mL Intravenous Q12H   Continuous Infusions: . sodium chloride    . piperacillin-tazobactam (ZOSYN)  IV Stopped (06/15/17 1003)   PRN Meds:.sodium chloride, acetaminophen **OR** acetaminophen, hydrALAZINE, labetalol, ondansetron **OR** ondansetron (ZOFRAN) IV, sodium chloride flush  Allergies as of 06/14/2017  . (No Known Allergies)    No family history on file.  Social History   Social History  . Marital status: Single    Spouse name: N/A  . Number of children: N/A  . Years of education: N/A   Occupational History  . Not on file.   Social History Main Topics  . Smoking status: Never Smoker  . Smokeless tobacco: Never Used  . Alcohol use No  . Drug use: No  . Sexual activity: Not on file   Other Topics Concern  . Not on file   Social History Narrative  . No narrative on file    Review of Systems: All negative except as stated above in HPI.  Physical Exam: Vital signs: Vitals:   06/15/17 0600 06/15/17 0732  BP: (!) 178/97 (!) 160/85  Pulse: 72 79  Resp:  18  Temp:  98.4 F (36.9 C)   Last BM Date: 06/14/17 General:   Lethargic, obese, no acute distress HEENT: anicteric sclera, oropharynx clear Lungs:  Clear throughout to auscultation.  No wheezes, crackles, or rhonchi. No acute distress. Heart:  Regular rate and rhythm; no murmurs, clicks, rubs,  or gallops. Abdomen: epigastric and periumbilical tenderness with guarding, soft, nondistended, +BS  Rectal:  Deferred Ext: no edema  GI:  Lab Results:  Recent Labs  06/14/17 1503 06/15/17 0402  WBC 22.4* 16.8*  HGB 16.9* 13.7  HCT 51.7* 41.5  PLT 332 311   BMET  Recent Labs  06/14/17 1503 06/15/17 0402  NA 130* 130*  K 3.1* 3.3*  CL 97* 98*  CO2 23 24  GLUCOSE 203* 169*  BUN 48* 50*  CREATININE 2.88* 2.90*  CALCIUM 9.0 8.2*   LFT  Recent Labs  06/14/17 1503  PROT 6.8  ALBUMIN 3.4*   AST 19  ALT 19  ALKPHOS 86  BILITOT 0.8   PT/INR No results for input(s): LABPROT, INR in the last 72 hours.   Studies/Results: Ct Abdomen Pelvis Wo Contrast  Result Date: 06/14/2017 CLINICAL DATA:  Vomiting since yesterday. EXAM: CT ABDOMEN AND PELVIS WITHOUT CONTRAST TECHNIQUE: Multidetector CT imaging of the abdomen and pelvis was performed following the standard protocol without IV contrast. COMPARISON:  None. FINDINGS: Lower chest: The lung bases are clear of acute process. No pleural effusion or pulmonary lesions. The heart is borderline enlarged. No pericardial effusion. The distal esophagus and aorta are unremarkable. Hepatobiliary: No focal hepatic lesions or intrahepatic biliary dilatation. The gallbladder is grossly normal. No common bile duct dilatation. Pancreas: No mass, inflammation or ductal dilatation. Spleen: Normal size.  No focal lesions. Adrenals/Urinary Tract: The adrenal glands and kidneys are unremarkable. No renal, ureteral or bladder calculi or mass. Stomach/Bowel: The stomach cough duodenum bulb in C-loop appear normal. There is moderate wall thickening and inflammation surrounding the proximal loops of jejunum with edema in the small bowel mesenteric. Findings likely due to an inflammatory or infectious process. Feeding distal small bowel and colon are unremarkable. Scattered radiodensities throughout the small bowel and colon possibly due to ingested material such as Pepto-Bismol. The terminal ileum and appendix are normal. Vascular/Lymphatic: The aorta is normal in caliber. No atherosclerotic calcifications. Small scattered mesenteric and retroperitoneal lymph nodes but no mass or overt adenopathy. Reproductive: The uterus and ovaries are unremarkable. There is a small cyst associated with the right ovary. Other: No pelvic mass or adenopathy. No free pelvic fluid collections. No inguinal mass or adenopathy. No abdominal wall hernia or subcutaneous lesions. Musculoskeletal:  No significant bony findings. IMPRESSION: 1. Thickened at loops of proximal jejunum and mild inflammatory changes in the adjacent mesenteric likely due to un inflammatory or infectious process. No obstruction. 2. No other significant abdominal/pelvic findings. Electronically Signed   By: Marijo Sanes M.D.   On: 06/14/2017 19:07   US Renal  Result Date: 06/15/2017 CLINICAL DATA:  Evaluate for hydronephrosis. EXAM: RENAL / URINARY TRACT ULTRASOUND COMPLETE COMPARISON:  CT 06/14/2017 FINDINGS: Right Kidney: Length: 9.8 cm. Echogenicity within normal limits. No mass or hydronephrosis visualized. Left Kidney: Length: 9.6 cm. Echogenicity within normal limits. No mass or hydronephrosis visualized. Bladder: Appears normal for degree of bladder distention. IMPRESSION: Normal renal sonogram. Electronically Signed   By: Kerby Moors M.D.   On: 06/15/2017 12:52    Impression/Plan: Abdominal pain, nausea and vomiting in the setting of hypertensive urgency and acute renal failure with proximal jejunitis seen on CT. Suspect she has inflammatory enteritis likely exacerbated by her hypertensive urgency. Would continue IV Abx and supportive care. No role for GI endoscopic procedures at this time. PO diet as  tolerated. Will follow.    LOS: 0 days   [place] C.  06/15/2017, 1:23 PM

## 2017-06-15 NOTE — Progress Notes (Signed)
18 beat run vtach. Dr mody paged and made aware. MD will place orders.

## 2017-06-15 NOTE — Progress Notes (Signed)
[facility] [place] NAME: Renee [name]    MR#:  [id number]  DATE OF BIRTH:  1976-04-22  SUBJECTIVE:   Patient here with Abdominal pain and elevated blood sugar. She is tolerating her diet today. Abdominal pain and nausea have improved.  REVIEW OF SYSTEMS:    Review of Systems  Constitutional: Negative for fever, chills weight loss HENT: Negative for ear pain, nosebleeds, congestion, facial swelling, rhinorrhea, neck pain, neck stiffness and ear discharge.   Respiratory: Negative for cough, shortness of breath, wheezing  Cardiovascular: Negative for chest pain, palpitations and ++ leg swelling.  Gastrointestinal: Negative for heartburn, abdominal pain, vomiting, diarrhea or consitpation Genitourinary: Negative for dysuria, urgency, frequency, hematuria Musculoskeletal: Negative for back pain or joint pain Neurological: Negative for dizziness, seizures, syncope, focal weakness,  numbness and headaches.  Hematological: Does not bruise/bleed easily.  Psychiatric/Behavioral: Negative for hallucinations, confusion, dysphoric mood    Tolerating Diet: yes      DRUG ALLERGIES:  No Known Allergies  VITALS:  Blood pressure (!) 160/85, pulse 79, temperature 98.4 F (36.9 C), temperature source Oral, resp. rate 18, height 5\' 4"  (1.626 m), weight 113.8 kg (250 lb 14.1 oz), last menstrual period 06/07/2017, SpO2 96 %.  PHYSICAL EXAMINATION:  Constitutional: Appears well-developed and well-nourished. No distress. HENT: Normocephalic. Marland Kitchen Oropharynx is clear and moist.  Eyes: Conjunctivae and EOM are normal. PERRLA, no scleral icterus.  Neck: Normal ROM. Neck supple. No JVD. No tracheal deviation. CVS: RRR, S1/S2 +, no murmurs, no gallops, no carotid bruit.  Pulmonary: Effort and breath sounds normal, no stridor, rhonchi, wheezes, rales.  Abdominal: Soft. BS +,  no distension, tenderness, rebound or guarding.  Musculoskeletal: Normal range of  motion. ++2+ LEE  Neuro: Alert. CN 2-12 grossly intact. No focal deficits. Skin: Skin is warm and dry. No rash noted. Psychiatric: Normal mood and affect.      LABORATORY PANEL:   CBC  Recent Labs Lab 06/15/17 0402  WBC 16.8*  HGB 13.7  HCT 41.5  PLT 311   ------------------------------------------------------------------------------------------------------------------  Chemistries   Recent Labs Lab 06/14/17 1503 06/15/17 0057 06/15/17 0402  NA 130*  --  130*  K 3.1*  --  3.3*  CL 97*  --  98*  CO2 23  --  24  GLUCOSE 203*  --  169*  BUN 48*  --  50*  CREATININE 2.88*  --  2.90*  CALCIUM 9.0  --  8.2*  MG  --  1.8  --   AST 19  --   --   ALT 19  --   --   ALKPHOS 86  --   --   BILITOT 0.8  --   --    ------------------------------------------------------------------------------------------------------------------  Cardiac Enzymes  Recent Labs Lab 06/14/17 1503 06/15/17 0057 06/15/17 0657  TROPONINI 0.06* 0.06* 0.06*   ------------------------------------------------------------------------------------------------------------------  RADIOLOGY:  Ct Abdomen Pelvis Wo Contrast  Result Date: 06/14/2017 CLINICAL DATA:  Vomiting since yesterday. EXAM: CT ABDOMEN AND PELVIS WITHOUT CONTRAST TECHNIQUE: Multidetector CT imaging of the abdomen and pelvis was performed following the standard protocol without IV contrast. COMPARISON:  None. FINDINGS: Lower chest: The lung bases are clear of acute process. No pleural effusion or pulmonary lesions. The heart is borderline enlarged. No pericardial effusion. The distal esophagus and aorta are unremarkable. Hepatobiliary: No focal hepatic lesions or intrahepatic biliary dilatation. The gallbladder is grossly normal. No common bile duct dilatation. Pancreas: No mass, inflammation or ductal dilatation. Spleen: Normal size.  No focal lesions. Adrenals/Urinary Tract: The adrenal glands and kidneys are unremarkable. No renal,  ureteral or bladder calculi or mass. Stomach/Bowel: The stomach cough duodenum bulb in C-loop appear normal. There is moderate wall thickening and inflammation surrounding the proximal loops of jejunum with edema in the small bowel mesenteric. Findings likely due to an inflammatory or infectious process. Feeding distal small bowel and colon are unremarkable. Scattered radiodensities throughout the small bowel and colon possibly due to ingested material such as Pepto-Bismol. The terminal ileum and appendix are normal. Vascular/Lymphatic: The aorta is normal in caliber. No atherosclerotic calcifications. Small scattered mesenteric and retroperitoneal lymph nodes but no mass or overt adenopathy. Reproductive: The uterus and ovaries are unremarkable. There is a small cyst associated with the right ovary. Other: No pelvic mass or adenopathy. No free pelvic fluid collections. No inguinal mass or adenopathy. No abdominal wall hernia or subcutaneous lesions. Musculoskeletal: No significant bony findings. IMPRESSION: 1. Thickened at loops of proximal jejunum and mild inflammatory changes in the adjacent mesenteric likely due to un inflammatory or infectious process. No obstruction. 2. No other significant abdominal/pelvic findings. Electronically Signed   By: Marijo Sanes M.D.   On: 06/14/2017 19:07     ASSESSMENT AND PLAN:   41 year old female with a history of essential hypertension, iron deficiency anemia, chronic kidney disease stage III who presented with epigastric abdominal pain and found to have accelerated essential hypertension.  1. Accelerated essential hypertension: Patient is now back on her outpatient medications and blood pressures improved.  Continue hydralazine, labetalol, clonidine  2. Fluid overload/lower extremity edema: Most recent echocardiogram December shows no signs of CHF Follow-up on today's echo. Continue IV Lasix Monitor intake and output Monitor daily weight Monitor  creatinine  3. Acute on chronic kidney disease stage III: Nephrology consult Avoid NSAIDs Avoid nephrotoxic agent Renal ultrasound Holding ACE inhibitor due to increased creatinine     4. Epigastric abdominal pain with enteritis: Continue Zosyn Continue PPI Follow-up in GI recommendations   5. Elevated troponin due to demand ischemia and poor renal clearance. Patient has ruled out for non-ST elevation MI  6. Leukocytosis due to intra-abdominal pathology This is improving  7. Hyponatremia: Sodium is at 1:30 which is stable TSH 1.4 BMP for a.m. Nephrology consult  Management plans discussed with the patient and she is in agreement.  CODE STATUS: full  TOTAL TIME TAKING CARE OF THIS PATIENT: 30 minutes.     POSSIBLE D/C tomorrow, DEPENDING ON CLINICAL CONDITION.   Liandra Mendia M.D on 06/15/2017 at 9:14 AM  Between 7am to 6pm - Pager - [phone] After 6pm go to [email or web address] - password [email or web address] [facility]  Office  [phone]  CC: Primary care physician; [facility]  Note: This dictation was prepared with Dragon dictation along with smaller phrase technology. Any transcriptional errors that result from this process are unintentional.

## 2017-06-15 NOTE — Care Management Obs Status (Signed)
[facility] NOTIFICATION   Patient Details  Name: Renee [name] MRN: [id number] Date of Birth: 21-Nov-1976   Medicare Observation Status Notification Given:  Yes [facility] letter)    Mardene Speak, RN 06/15/2017, 4:21 PM

## 2017-06-15 NOTE — Progress Notes (Signed)
Patient resting in bed. No signs or complaints of distress. Continue to monitor.

## 2017-06-15 NOTE — Progress Notes (Signed)
*  PRELIMINARY RESULTS* Echocardiogram 2D Echocardiogram has been performed.  Renee [name] Stauber 06/15/2017, 9:52 AM

## 2017-06-15 NOTE — Consult Note (Signed)
[facility]  CARDIOLOGY CONSULT NOTE  Patient ID: Renee [name] MRN: [id number] DOB/AGE: February 10, 1976 41 y.o.  Admit date: 06/14/2017 Referring Physician Dr. Benjie Karvonen Primary Physician Dr. Vicente Masson Primary Cardiologist   Reason for Consultation troponin  HPI: Patient is a 41 year old female with history of hypertension who was admitted with abdominal pain and several days of nausea and vomiting. Patient states she has stopped all of her medications for at least several days due to inability to eat or drink. She is on an aggressive regimen at home including clonidine 0.2 mg twice a day, hydralazine 100 mg twice daily, labetalol 200 mg twice daily, lisinopril 1 mg daily, furosemide 20 mg daily. She complained of nausea and vomiting as well as abdominal pain. Routine troponin was drawn in the ER which revealed a level of 0.06 which has been flat since yesterday. Echocardiogram revealed moderate to severe left ventricular hypertrophy with preserved LV function. No high-grade valvular abnormalities. She has renal insufficiency likely secondary to poorly controlled hypertension with a serum creatinine of 2.9 with a BUN of 50 and a GFR of 22. EKG on admission revealed sinus rhythm, sinus tachycardia with left ventricular hypertrophy. She denies any chest pain. On telemetry she has sinus rhythm with several nonsustained runs of asymptomatic wide complex tach. Ventricular arrhythmia versus aberrantly SVT. Patient denies any syncope. She eats high sodium diet.  .Review of Systems  HENT: Negative.   Eyes: Negative.   Respiratory: Positive for shortness of breath.   Cardiovascular: Negative.   Gastrointestinal: Positive for abdominal pain, nausea and vomiting.  Genitourinary: Negative.   Musculoskeletal: Negative.   Skin: Negative.   Neurological: Positive for dizziness and weakness.  Endo/Heme/Allergies: Negative.   Psychiatric/Behavioral: Negative.     Past  Medical History:  Diagnosis Date  . Hypertension     No family history on file.  Social History   Social History  . Marital status: Single    Spouse name: N/A  . Number of children: N/A  . Years of education: N/A   Occupational History  . Not on file.   Social History Main Topics  . Smoking status: Never Smoker  . Smokeless tobacco: Never Used  . Alcohol use No  . Drug use: No  . Sexual activity: Not on file   Other Topics Concern  . Not on file   Social History Narrative  . No narrative on file    Past Surgical History:  Procedure Laterality Date  . CESAREAN SECTION    . TUBAL LIGATION       Prescriptions Prior to Admission  Medication Sig Dispense Refill Last Dose  . cloNIDine (CATAPRES) 0.2 MG tablet Take 1 tablet (0.2 mg total) by mouth 2 (two) times daily. 180 tablet 3 Past Week at Unknown time  . hydrALAZINE (APRESOLINE) 100 MG tablet Take 1 tablet (100 mg total) by mouth 2 (two) times daily. 60 tablet 0 Past Week at Unknown time  . labetalol (NORMODYNE) 200 MG tablet Take 200 mg by mouth 2 (two) times daily.   Past Week at Unknown time  . lisinopril (PRINIVIL,ZESTRIL) 40 MG tablet Take 1 tablet (40 mg total) by mouth daily. 30 tablet 0 Past Week at Unknown time  . furosemide (LASIX) 20 MG tablet Take 20 mg by mouth daily as needed.    prn at prn  . hydrALAZINE (APRESOLINE) 50 MG tablet Take 1 tablet (50 mg total) by mouth 2 (two) times daily. (Patient not taking: Reported  on 06/14/2017) 180 tablet 3 Not Taking at Unknown time    Physical Exam: Blood pressure (!) 160/85, pulse 79, temperature 98.4 F (36.9 C), temperature source Oral, resp. rate 18, height 5\' 4"  (1.626 m), weight 113.8 kg (250 lb 14.1 oz), last menstrual period 06/07/2017, SpO2 96 %.   Wt Readings from Last 1 Encounters:  06/15/17 113.8 kg (250 lb 14.1 oz)     General appearance: alert and cooperative Resp: clear to auscultation bilaterally Cardio: regular rate and rhythm GI: abnormal  findings:  mild tenderness in the epigastrium Extremities: extremities normal, atraumatic, no cyanosis or edema Pulses: 2+ and symmetric Neurologic: Grossly normal  Labs:   Lab Results  Component Value Date   WBC 16.8 (H) 06/15/2017   HGB 13.7 06/15/2017   HCT 41.5 06/15/2017   MCV 72.3 (L) 06/15/2017   PLT 311 06/15/2017    Recent Labs Lab 06/14/17 1503 06/15/17 0402  NA 130* 130*  K 3.1* 3.3*  CL 97* 98*  CO2 23 24  BUN 48* 50*  CREATININE 2.88* 2.90*  CALCIUM 9.0 8.2*  PROT 6.8  --   BILITOT 0.8  --   ALKPHOS 86  --   ALT 19  --   AST 19  --   GLUCOSE 203* 169*   Lab Results  Component Value Date   TROPONINI 0.06 ([place]) 06/15/2017       EKG: Normal sinus rhythm  ASSESSMENT AND PLAN:  Patient with history of hypertension with recent noncompliance apparently secondary to nausea and vomiting. She has been off of all of her medications which have been fairly aggressive for several days. In the emergency room she was noted to have acute on chronic renal insufficiency with serum creatinine of 2.9 and GFR of 22. She denied any chest pain but routine serum troponin was drawn showing a level of 0.063. EKG showed no ST-T wave changes. She did have several nonsustained runs of wide complex tach admission. These were asymptomatic. Her serum potassium was mildly low at 3.1. We'll need to replete this. Serum magnesium is pending. Her LV function is normal. Her elevated serum troponin does not appear to be secondary to acute coronary syndrome but appears to be demand ischemia in the setting of acute on chronic renal insufficiency likely secondary to dehydration. Will replete her potassium and keep her magnesium greater than 2. Would continue with her current antihypertensive which have been resumed. Would agree with evaluation by nephrology however this appears to be volume depletion. Gastrointestinal consultation will need to be evaluated to determine the etiology of her nausea and  vomiting. No further cardiac workup indicated at present however will follow with you. Signed: Teodoro Spray MD, [facility] 06/15/2017, 11:00 AM

## 2017-06-15 NOTE — Consult Note (Signed)
[facility]  CONSULT NOTE    Date: 06/15/2017                  Patient Name:  Renee [name]  MRN: [id number]  DOB: Dec 31, 1976  Age / Sex: 41 y.o., female         PCP: Shari Prows, [facility]                 Service Requesting Consult: Dr. Ether Griffins                 Reason for Consult: Acute renal failure            History of Present Illness: Renee [name] is a 41 y.o. black female with hypertension, diabetes mellitus type II, morbid obesity, anemia, who was admitted to [facility] on 06/14/2017 for Epigastric pain [R10.13] Acute on chronic renal insufficiency [N28.9, N18.9] Hypertensive urgency [I16.0] Non-intractable vomiting with nausea, unspecified vomiting type [R11.2] Malignant essential hypertension [I10]   Patient set up to see out practice in 11/2016 but she did not show up. She states that she was not been told in the past that she has chronic kidney disease. Creatinine of 2.44 with GFR of 27 on 12/21/16.  Patient has been diagnosed with hypertension for more than 10 years. She does not take her medications as prescribed. She states she misses her pills frequently. She does not check her blood pressure at home. She is not following a low salt diet. Patient denies history of coronary artery disease or cerebrovascular disease.    Medications: Outpatient medications: Prescriptions Prior to Admission  Medication Sig Dispense Refill Last Dose  . cloNIDine (CATAPRES) 0.2 MG tablet Take 1 tablet (0.2 mg total) by mouth 2 (two) times daily. 180 tablet 3 Past Week at Unknown time  . hydrALAZINE (APRESOLINE) 100 MG tablet Take 1 tablet (100 mg total) by mouth 2 (two) times daily. 60 tablet 0 Past Week at Unknown time  . labetalol (NORMODYNE) 200 MG tablet Take 200 mg by mouth 2 (two) times daily.   Past Week at Unknown time  . lisinopril (PRINIVIL,ZESTRIL) 40 MG tablet Take 1 tablet (40 mg total) by mouth daily. 30 tablet 0 Past Week at Unknown time  .  furosemide (LASIX) 20 MG tablet Take 20 mg by mouth daily as needed.    prn at prn  . hydrALAZINE (APRESOLINE) 50 MG tablet Take 1 tablet (50 mg total) by mouth 2 (two) times daily. (Patient not taking: Reported on 06/14/2017) 180 tablet 3 Not Taking at Unknown time    Current medications: Current Facility-Administered Medications  Medication Dose Route Frequency Provider Last Rate Last Dose  . 0.9 %  sodium chloride infusion  250 mL Intravenous PRN Theodoro Grist, MD      . acetaminophen (TYLENOL) tablet 650 mg  650 mg Oral Q6H PRN Theodoro Grist, MD       Or  . acetaminophen (TYLENOL) suppository 650 mg  650 mg Rectal Q6H PRN Theodoro Grist, MD      . aspirin EC tablet 81 mg  81 mg Oral Daily Theodoro Grist, MD   81 mg at 06/15/17 0913  . cloNIDine (CATAPRES) tablet 0.2 mg  0.2 mg Oral BID Theodoro Grist, MD   0.2 mg at 06/15/17 0825  . furosemide (LASIX) injection 40 mg  40 mg Intravenous Daily Theodoro Grist, MD   40 mg at 06/15/17 0825  . heparin injection 5,000 Units  5,000 Units Subcutaneous Q8H Theodoro Grist, MD  5,000 Units at 06/15/17 0603  . hydrALAZINE (APRESOLINE) injection 10 mg  10 mg Intravenous Q4H PRN Lance Coon, MD   10 mg at 06/15/17 0603  . hydrALAZINE (APRESOLINE) tablet 100 mg  100 mg Oral BID Theodoro Grist, MD   100 mg at 06/14/17 2139  . labetalol (NORMODYNE) tablet 200 mg  200 mg Oral BID Theodoro Grist, MD   200 mg at 06/15/17 0824  . labetalol (NORMODYNE,TRANDATE) injection 10 mg  10 mg Intravenous Q2H PRN Lance Coon, MD   10 mg at 06/15/17 0258  . nitroGLYCERIN (NITROGLYN) 2 % ointment 1 inch  1 inch Topical Q6H Theodoro Grist, MD   1 inch at 06/15/17 0604  . ondansetron (ZOFRAN) tablet 4 mg  4 mg Oral Q6H PRN Theodoro Grist, MD       Or  . ondansetron (ZOFRAN) injection 4 mg  4 mg Intravenous Q6H PRN Theodoro Grist, MD      . pantoprazole (PROTONIX) EC tablet 40 mg  40 mg Oral Daily Theodoro Grist, MD   40 mg at 06/15/17 0824  . piperacillin-tazobactam  (ZOSYN) IVPB 3.375 g  3.375 g Intravenous Q8H Theodoro Grist, MD   Stopped at 06/15/17 1003  . sodium chloride flush (NS) 0.9 % injection 3 mL  3 mL Intravenous Q12H Theodoro Grist, MD   3 mL at 06/15/17 0825  . sodium chloride flush (NS) 0.9 % injection 3 mL  3 mL Intravenous PRN Theodoro Grist, MD          Allergies: No Known Allergies    Past Medical History: Past Medical History:  Diagnosis Date  . Hypertension      Past Surgical History: Past Surgical History:  Procedure Laterality Date  . CESAREAN SECTION    . TUBAL LIGATION       Family History: No family history on file.   Social History: Social History   Social History  . Marital status: Single    Spouse name: N/A  . Number of children: N/A  . Years of education: N/A   Occupational History  . Not on file.   Social History Main Topics  . Smoking status: Never Smoker  . Smokeless tobacco: Never Used  . Alcohol use No  . Drug use: No  . Sexual activity: Not on file   Other Topics Concern  . Not on file   Social History Narrative  . No narrative on file     Review of Systems: Review of Systems  Constitutional: Negative.  Negative for chills, diaphoresis, fever, malaise/fatigue and weight loss.  HENT: Negative.  Negative for congestion, ear discharge, ear pain, hearing loss, nosebleeds, sinus pain, sore throat and tinnitus.   Eyes: Negative.  Negative for blurred vision, double vision, photophobia, pain, discharge and redness.  Respiratory: Negative.  Negative for cough, hemoptysis, sputum production, shortness of breath, wheezing and stridor.   Cardiovascular: Negative.  Negative for chest pain, palpitations, orthopnea, claudication, leg swelling and PND.  Gastrointestinal: Positive for abdominal pain, nausea and vomiting. Negative for blood in stool, constipation, diarrhea, heartburn and melena.  Genitourinary: Negative.  Negative for dysuria, flank pain, frequency, hematuria and urgency.   Musculoskeletal: Negative.  Negative for back pain, falls, joint pain, myalgias and neck pain.  Skin: Negative.  Negative for itching and rash.  Neurological: Positive for dizziness and headaches. Negative for tingling, tremors, sensory change, speech change, focal weakness, seizures, loss of consciousness and weakness.  Endo/Heme/Allergies: Negative for environmental allergies and polydipsia. Does not bruise/bleed easily.  Psychiatric/Behavioral: Negative.  Negative for depression, hallucinations, memory loss, substance abuse and suicidal ideas. The patient is not nervous/anxious and does not have insomnia.     Vital Signs: Blood pressure (!) 160/85, pulse 79, temperature 98.4 F (36.9 C), temperature source Oral, resp. rate 18, height 5\' 4"  (1.626 m), weight 113.8 kg (250 lb 14.1 oz), last menstrual period 06/07/2017, SpO2 96 %.  Weight trends: Filed Weights   06/14/17 1407 06/15/17 0052 06/15/17 0500  Weight: 113.4 kg (250 lb) 113.8 kg (250 lb 14.4 oz) 113.8 kg (250 lb 14.1 oz)    Physical Exam: General: NAD, sitting up in bed  Head: Normocephalic, atraumatic. Moist oral mucosal membranes  Eyes: Anicteric, PERRL  Neck: Supple, trachea midline  Lungs:  Clear to auscultation  Heart: Regular rate and rhythm  Abdomen:  Soft, nontender,   Extremities: no peripheral edema.  Neurologic: Nonfocal, moving all four extremities  Skin: No lesions        Lab results: Basic Metabolic Panel:  Recent Labs Lab 06/14/17 1503 06/15/17 0057 06/15/17 0402 06/15/17 0714  NA 130*  --  130*  --   K 3.1*  --  3.3*  --   CL 97*  --  98*  --   CO2 23  --  24  --   GLUCOSE 203*  --  169*  --   BUN 48*  --  50*  --   CREATININE 2.88*  --  2.90*  --   CALCIUM 9.0  --  8.2*  --   MG  --  1.8  --  1.9    Liver Function Tests:  Recent Labs Lab 06/14/17 1503  AST 19  ALT 19  ALKPHOS 86  BILITOT 0.8  PROT 6.8  ALBUMIN 3.4*    Recent Labs Lab 06/14/17 1503  LIPASE 19   No  results for input(s): AMMONIA in the last 168 hours.  CBC:  Recent Labs Lab 06/14/17 1503 06/15/17 0402  WBC 22.4* 16.8*  NEUTROABS 20.0*  --   HGB 16.9* 13.7  HCT 51.7* 41.5  MCV 72.9* 72.3*  PLT 332 311    Cardiac Enzymes:  Recent Labs Lab 06/14/17 1503 06/15/17 0057 06/15/17 0657  TROPONINI 0.06* 0.06* 0.06*    BNP: Invalid input(s): POCBNP  CBG:  Recent Labs Lab 06/15/17 0733  GLUCAP 141*    Microbiology: Results for orders placed or performed during the hospital encounter of 12/20/16  Blood culture (routine x 2)     Status: None   Collection Time: 12/20/16 12:35 PM  Result Value Ref Range Status   Specimen Description BLOOD LEFT WRIST  Final   Special Requests   Final    BOTTLES DRAWN AEROBIC AND ANAEROBIC AER 8ML ANA 8ML   Culture NO GROWTH 5 DAYS  Final   Report Status 12/25/2016 FINAL  Final  Blood culture (routine x 2)     Status: None   Collection Time: 12/20/16 12:42 PM  Result Value Ref Range Status   Specimen Description BLOOD LEFT AC  Final   Special Requests   Final    BOTTLES DRAWN AEROBIC AND ANAEROBIC AER 7ML ANA 10ML   Culture NO GROWTH 5 DAYS  Final   Report Status 12/25/2016 FINAL  Final    Coagulation Studies: No results for input(s): LABPROT, INR in the last 72 hours.  Urinalysis: No results for input(s): COLORURINE, LABSPEC, PHURINE, GLUCOSEU, HGBUR, BILIRUBINUR, KETONESUR, PROTEINUR, UROBILINOGEN, NITRITE, LEUKOCYTESUR in the last 72 hours.  Invalid input(s): APPERANCEUR  Imaging: Ct Abdomen Pelvis Wo Contrast  Result Date: 06/14/2017 CLINICAL DATA:  Vomiting since yesterday. EXAM: CT ABDOMEN AND PELVIS WITHOUT CONTRAST TECHNIQUE: Multidetector CT imaging of the abdomen and pelvis was performed following the standard protocol without IV contrast. COMPARISON:  None. FINDINGS: Lower chest: The lung bases are clear of acute process. No pleural effusion or pulmonary lesions. The heart is borderline enlarged. No pericardial  effusion. The distal esophagus and aorta are unremarkable. Hepatobiliary: No focal hepatic lesions or intrahepatic biliary dilatation. The gallbladder is grossly normal. No common bile duct dilatation. Pancreas: No mass, inflammation or ductal dilatation. Spleen: Normal size.  No focal lesions. Adrenals/Urinary Tract: The adrenal glands and kidneys are unremarkable. No renal, ureteral or bladder calculi or mass. Stomach/Bowel: The stomach cough duodenum bulb in C-loop appear normal. There is moderate wall thickening and inflammation surrounding the proximal loops of jejunum with edema in the small bowel mesenteric. Findings likely due to an inflammatory or infectious process. Feeding distal small bowel and colon are unremarkable. Scattered radiodensities throughout the small bowel and colon possibly due to ingested material such as Pepto-Bismol. The terminal ileum and appendix are normal. Vascular/Lymphatic: The aorta is normal in caliber. No atherosclerotic calcifications. Small scattered mesenteric and retroperitoneal lymph nodes but no mass or overt adenopathy. Reproductive: The uterus and ovaries are unremarkable. There is a small cyst associated with the right ovary. Other: No pelvic mass or adenopathy. No free pelvic fluid collections. No inguinal mass or adenopathy. No abdominal wall hernia or subcutaneous lesions. Musculoskeletal: No significant bony findings. IMPRESSION: 1. Thickened at loops of proximal jejunum and mild inflammatory changes in the adjacent mesenteric likely due to un inflammatory or infectious process. No obstruction. 2. No other significant abdominal/pelvic findings. Electronically Signed   By: Marijo Sanes M.D.   On: 06/14/2017 19:07      Assessment & Plan: Renee [name] is a 41 y.o. black female with hypertension, diabetes mellitus type II, morbid obesity, anemia, who was admitted to [facility] on 06/14/2017 for Epigastric pain [R10.13] Acute on chronic renal insufficiency  [N28.9, N18.9] Hypertensive urgency [I16.0] Non-intractable vomiting with nausea, unspecified vomiting type [R11.2] Malignant essential hypertension [I10]   1. Acute renal failure on chronic kidney disease stage IV with proteinuria, hyponatremia and glucosuria.  Creatinine 2.44, GFR 27 on 11/2016. Renal ultrasound on 03/05/15 with no abnormalities. Acute renal failure versus prerenal azotemia.  Suspect underlying chronic kidney disease secondary to hypertension and diabetes - Pending urine studies, renal ultrasound - Discussed chronic kidney disease and renal failure with patient and family  2. Hypertension: ran out of her outpatient blood pressure medications.  Echocardiogram from 12/21 without congestive heart failure Home regimen of clonidine, furosemide, hydralazine, labetalol and lisinopril - holding lisinopril  3. Diabetes mellitus type II with chronic kidney disease: hemoglobin A1c 7% on 04/11/2015. Not currently on any agents. Glucosuria on urinalysis.  - Check hemoglobin A1c  LOS: 0 Aizlyn Schifano 6/16/201811:10 AM

## 2017-06-16 DIAGNOSIS — T501X5A Adverse effect of loop [high-ceiling] diuretics, initial encounter: Secondary | ICD-10-CM | POA: Diagnosis present

## 2017-06-16 DIAGNOSIS — E876 Hypokalemia: Secondary | ICD-10-CM | POA: Diagnosis present

## 2017-06-16 DIAGNOSIS — E1122 Type 2 diabetes mellitus with diabetic chronic kidney disease: Secondary | ICD-10-CM | POA: Diagnosis present

## 2017-06-16 DIAGNOSIS — I129 Hypertensive chronic kidney disease with stage 1 through stage 4 chronic kidney disease, or unspecified chronic kidney disease: Secondary | ICD-10-CM | POA: Diagnosis not present

## 2017-06-16 DIAGNOSIS — E871 Hypo-osmolality and hyponatremia: Secondary | ICD-10-CM | POA: Diagnosis present

## 2017-06-16 DIAGNOSIS — R1013 Epigastric pain: Secondary | ICD-10-CM | POA: Diagnosis not present

## 2017-06-16 DIAGNOSIS — Z9111 Patient's noncompliance with dietary regimen: Secondary | ICD-10-CM | POA: Diagnosis not present

## 2017-06-16 DIAGNOSIS — E1165 Type 2 diabetes mellitus with hyperglycemia: Secondary | ICD-10-CM | POA: Diagnosis present

## 2017-06-16 DIAGNOSIS — E860 Dehydration: Secondary | ICD-10-CM | POA: Diagnosis present

## 2017-06-16 DIAGNOSIS — I10 Essential (primary) hypertension: Secondary | ICD-10-CM | POA: Diagnosis present

## 2017-06-16 DIAGNOSIS — K529 Noninfective gastroenteritis and colitis, unspecified: Secondary | ICD-10-CM | POA: Diagnosis present

## 2017-06-16 DIAGNOSIS — Z6841 Body Mass Index (BMI) 40.0 and over, adult: Secondary | ICD-10-CM | POA: Diagnosis not present

## 2017-06-16 DIAGNOSIS — I248 Other forms of acute ischemic heart disease: Secondary | ICD-10-CM | POA: Diagnosis present

## 2017-06-16 DIAGNOSIS — I160 Hypertensive urgency: Secondary | ICD-10-CM | POA: Diagnosis present

## 2017-06-16 DIAGNOSIS — N179 Acute kidney failure, unspecified: Secondary | ICD-10-CM | POA: Diagnosis not present

## 2017-06-16 DIAGNOSIS — Z9119 Patient's noncompliance with other medical treatment and regimen: Secondary | ICD-10-CM | POA: Diagnosis not present

## 2017-06-16 DIAGNOSIS — D509 Iron deficiency anemia, unspecified: Secondary | ICD-10-CM | POA: Diagnosis present

## 2017-06-16 DIAGNOSIS — R81 Glycosuria: Secondary | ICD-10-CM | POA: Diagnosis present

## 2017-06-16 DIAGNOSIS — N184 Chronic kidney disease, stage 4 (severe): Secondary | ICD-10-CM | POA: Diagnosis not present

## 2017-06-16 DIAGNOSIS — Z79899 Other long term (current) drug therapy: Secondary | ICD-10-CM | POA: Diagnosis not present

## 2017-06-16 DIAGNOSIS — N183 Chronic kidney disease, stage 3 (moderate): Secondary | ICD-10-CM | POA: Diagnosis present

## 2017-06-16 DIAGNOSIS — R809 Proteinuria, unspecified: Secondary | ICD-10-CM | POA: Diagnosis not present

## 2017-06-16 DIAGNOSIS — I130 Hypertensive heart and chronic kidney disease with heart failure and stage 1 through stage 4 chronic kidney disease, or unspecified chronic kidney disease: Secondary | ICD-10-CM | POA: Diagnosis present

## 2017-06-16 DIAGNOSIS — R109 Unspecified abdominal pain: Secondary | ICD-10-CM | POA: Diagnosis not present

## 2017-06-16 DIAGNOSIS — I5030 Unspecified diastolic (congestive) heart failure: Secondary | ICD-10-CM | POA: Diagnosis present

## 2017-06-16 DIAGNOSIS — K2900 Acute gastritis without bleeding: Secondary | ICD-10-CM | POA: Diagnosis present

## 2017-06-16 LAB — BASIC METABOLIC PANEL
ANION GAP: 7 (ref 5–15)
Anion gap: 8 (ref 5–15)
BUN: 52 mg/dL — AB (ref 6–20)
BUN: 50 mg/dL — ABNORMAL HIGH (ref 6–20)
CHLORIDE: 100 mmol/L — AB (ref 101–111)
CO2: 24 mmol/L (ref 22–32)
CO2: 25 mmol/L (ref 22–32)
CREATININE: 3.18 mg/dL — AB (ref 0.44–1.00)
Calcium: 8.2 mg/dL — ABNORMAL LOW (ref 8.9–10.3)
Calcium: 8.3 mg/dL — ABNORMAL LOW (ref 8.9–10.3)
Chloride: 99 mmol/L — ABNORMAL LOW (ref 101–111)
Creatinine, Ser: 3.17 mg/dL — ABNORMAL HIGH (ref 0.44–1.00)
GFR calc Af Amer: 20 mL/min — ABNORMAL LOW (ref >60)
GFR calc non Af Amer: 17 mL/min — ABNORMAL LOW (ref >60)
GFR, EST AFRICAN AMERICAN: 20 mL/min — AB (ref >60)
GFR, EST NON AFRICAN AMERICAN: 17 mL/min — AB (ref >60)
GLUCOSE: 139 mg/dL — AB (ref 65–99)
Glucose, Bld: 162 mg/dL — ABNORMAL HIGH (ref 65–99)
POTASSIUM: 3.5 mmol/L (ref 3.5–5.1)
Potassium: 3.4 mmol/L — ABNORMAL LOW (ref 3.5–5.1)
Sodium: 132 mmol/L — ABNORMAL LOW (ref 135–145)
Sodium: 131 mmol/L — ABNORMAL LOW (ref 135–145)

## 2017-06-16 LAB — HIV ANTIBODY (ROUTINE TESTING W REFLEX): HIV SCREEN 4TH GENERATION: NONREACTIVE

## 2017-06-16 LAB — HEMOGLOBIN A1C
HEMOGLOBIN A1C: 7.5 % — AB (ref 4.8–5.6)
HEMOGLOBIN A1C: 7.1 % — AB (ref 4.8–5.6)
MEAN PLASMA GLUCOSE: 169 mg/dL
Mean Plasma Glucose: 157 mg/dL

## 2017-06-16 LAB — GLUCOSE, CAPILLARY: Glucose-Capillary: 131 mg/dL — ABNORMAL HIGH (ref 65–99)

## 2017-06-16 MED ORDER — SIMETHICONE 80 MG PO CHEW
80.0000 mg | CHEWABLE_TABLET | Freq: Four times a day (QID) | ORAL | Status: DC | PRN
  Administered 2017-06-16: 80 mg via ORAL
  Filled 2017-06-16: qty 1

## 2017-06-16 NOTE — Progress Notes (Signed)
[facility]  ROUNDING NOTE   Subjective:   Echocardiogram shows diastolic dysfunction.   Eating fried shrimp in room.   Creatinine with no change  Blood pressure better controlled.   Objective:  Vital signs in last 24 hours:  Temp:  [97.6 F (36.4 C)-98.4 F (36.9 C)] 97.6 F (36.4 C) (06/17 0720) Pulse Rate:  [73-76] 76 (06/17 0720) Resp:  [16-18] 18 (06/17 0720) BP: (130-158)/(61-82) 158/82 (06/17 0720) SpO2:  [96 %-97 %] 96 % (06/17 0720) Weight:  [113.8 kg (250 lb 12.8 oz)] 113.8 kg (250 lb 12.8 oz) (06/17 0500)  Weight change: 0.363 kg (12.8 oz) Filed Weights   06/15/17 0052 06/15/17 0500 06/16/17 0500  Weight: 113.8 kg (250 lb 14.4 oz) 113.8 kg (250 lb 14.1 oz) 113.8 kg (250 lb 12.8 oz)    Intake/Output: I/O last 3 completed shifts: In: 61 [P.O.:240; IV Piggyback:250] Out: -    Intake/Output this shift:  No intake/output data recorded.  Physical Exam: General: NAD, sitting in chair  Head: Normocephalic, atraumatic. Moist oral mucosal membranes  Eyes: Anicteric, PERRL  Neck: Supple, trachea midline  Lungs:  Clear to auscultation  Heart: Regular rate and rhythm  Abdomen:  Soft, nontender,   Extremities: no peripheral edema.  Neurologic: Nonfocal, moving all four extremities  Skin: No lesions       Basic Metabolic Panel:  Recent Labs Lab 06/14/17 1503 06/15/17 0057 06/15/17 0402 06/15/17 0714 06/16/17 0415 06/16/17 0942  NA 130*  --  130*  --  132* 131*  K 3.1*  --  3.3*  --  3.4* 3.5  CL 97*  --  98*  --  100* 99*  CO2 23  --  24  --  24 25  GLUCOSE 203*  --  169*  --  139* 162*  BUN 48*  --  50*  --  52* 50*  CREATININE 2.88*  --  2.90*  --  3.18* 3.17*  CALCIUM 9.0  --  8.2*  --  8.2* 8.3*  MG  --  1.8  --  1.9  --   --     Liver Function Tests:  Recent Labs Lab 06/14/17 1503  AST 19  ALT 19  ALKPHOS 86  BILITOT 0.8  PROT 6.8  ALBUMIN 3.4*    Recent Labs Lab 06/14/17 1503  LIPASE 19   No results for  input(s): AMMONIA in the last 168 hours.  CBC:  Recent Labs Lab 06/14/17 1503 06/15/17 0402  WBC 22.4* 16.8*  NEUTROABS 20.0*  --   HGB 16.9* 13.7  HCT 51.7* 41.5  MCV 72.9* 72.3*  PLT 332 311    Cardiac Enzymes:  Recent Labs Lab 06/14/17 1503 06/15/17 0057 06/15/17 0657  TROPONINI 0.06* 0.06* 0.06*    BNP: Invalid input(s): POCBNP  CBG:  Recent Labs Lab 06/15/17 0733 06/16/17 0722  GLUCAP 141* 131*    Microbiology: Results for orders placed or performed during the hospital encounter of 12/20/16  Blood culture (routine x 2)     Status: None   Collection Time: 12/20/16 12:35 PM  Result Value Ref Range Status   Specimen Description BLOOD LEFT WRIST  Final   Special Requests   Final    BOTTLES DRAWN AEROBIC AND ANAEROBIC AER 8ML ANA 8ML   Culture NO GROWTH 5 DAYS  Final   Report Status 12/25/2016 FINAL  Final  Blood culture (routine x 2)     Status: None   Collection Time: 12/20/16 12:42 PM  Result  Value Ref Range Status   Specimen Description BLOOD LEFT AC  Final   Special Requests   Final    BOTTLES DRAWN AEROBIC AND ANAEROBIC AER 7ML ANA 10ML   Culture NO GROWTH 5 DAYS  Final   Report Status 12/25/2016 FINAL  Final    Coagulation Studies: No results for input(s): LABPROT, INR in the last 72 hours.  Urinalysis: No results for input(s): COLORURINE, LABSPEC, PHURINE, GLUCOSEU, HGBUR, BILIRUBINUR, KETONESUR, PROTEINUR, UROBILINOGEN, NITRITE, LEUKOCYTESUR in the last 72 hours.  Invalid input(s): APPERANCEUR    Imaging: Ct Abdomen Pelvis Wo Contrast  Result Date: 06/14/2017 CLINICAL DATA:  Vomiting since yesterday. EXAM: CT ABDOMEN AND PELVIS WITHOUT CONTRAST TECHNIQUE: Multidetector CT imaging of the abdomen and pelvis was performed following the standard protocol without IV contrast. COMPARISON:  None. FINDINGS: Lower chest: The lung bases are clear of acute process. No pleural effusion or pulmonary lesions. The heart is borderline enlarged. No  pericardial effusion. The distal esophagus and aorta are unremarkable. Hepatobiliary: No focal hepatic lesions or intrahepatic biliary dilatation. The gallbladder is grossly normal. No common bile duct dilatation. Pancreas: No mass, inflammation or ductal dilatation. Spleen: Normal size.  No focal lesions. Adrenals/Urinary Tract: The adrenal glands and kidneys are unremarkable. No renal, ureteral or bladder calculi or mass. Stomach/Bowel: The stomach cough duodenum bulb in C-loop appear normal. There is moderate wall thickening and inflammation surrounding the proximal loops of jejunum with edema in the small bowel mesenteric. Findings likely due to an inflammatory or infectious process. Feeding distal small bowel and colon are unremarkable. Scattered radiodensities throughout the small bowel and colon possibly due to ingested material such as Pepto-Bismol. The terminal ileum and appendix are normal. Vascular/Lymphatic: The aorta is normal in caliber. No atherosclerotic calcifications. Small scattered mesenteric and retroperitoneal lymph nodes but no mass or overt adenopathy. Reproductive: The uterus and ovaries are unremarkable. There is a small cyst associated with the right ovary. Other: No pelvic mass or adenopathy. No free pelvic fluid collections. No inguinal mass or adenopathy. No abdominal wall hernia or subcutaneous lesions. Musculoskeletal: No significant bony findings. IMPRESSION: 1. Thickened at loops of proximal jejunum and mild inflammatory changes in the adjacent mesenteric likely due to un inflammatory or infectious process. No obstruction. 2. No other significant abdominal/pelvic findings. Electronically Signed   By: Marijo Sanes M.D.   On: 06/14/2017 19:07   US Renal  Result Date: 06/15/2017 CLINICAL DATA:  Evaluate for hydronephrosis. EXAM: RENAL / URINARY TRACT ULTRASOUND COMPLETE COMPARISON:  CT 06/14/2017 FINDINGS: Right Kidney: Length: 9.8 cm. Echogenicity within normal limits. No mass or  hydronephrosis visualized. Left Kidney: Length: 9.6 cm. Echogenicity within normal limits. No mass or hydronephrosis visualized. Bladder: Appears normal for degree of bladder distention. IMPRESSION: Normal renal sonogram. Electronically Signed   By: Kerby Moors M.D.   On: 06/15/2017 12:52     Medications:   . sodium chloride    . piperacillin-tazobactam (ZOSYN)  IV Stopped (06/16/17 0925)   . aspirin EC  81 mg Oral Daily  . cloNIDine  0.2 mg Oral BID  . heparin  5,000 Units Subcutaneous Q8H  . hydrALAZINE  100 mg Oral BID  . labetalol  200 mg Oral BID  . pantoprazole  40 mg Oral Daily  . sodium chloride flush  3 mL Intravenous Q12H   sodium chloride, acetaminophen **OR** acetaminophen, alum & mag hydroxide-simeth, hydrALAZINE, labetalol, ondansetron **OR** ondansetron (ZOFRAN) IV, simethicone, sodium chloride flush  Assessment/ Plan:  [name] is a 41 y.o.  black female with hypertension, diabetes mellitus type II, morbid obesity, anemia, who was admitted to [facility] on 06/14/2017   1. Acute renal failure on chronic kidney disease stage IV with proteinuria, hyponatremia and glucosuria.  Creatinine 2.44, GFR 27 on 11/2016. Renal ultrasound reviewed.  No change in renal function, concern for progression of chronic kidney disease.  Suspect underlying chronic kidney disease secondary to hypertension and diabetes - Pending urine studies, renal ultrasound - Discussed chronic kidney disease and renal failure with patient and family  2. Hypertension with diastolic congestive heart failure: ran out of her outpatient blood pressure medications.  Home regimen of clonidine, furosemide, hydralazine, labetalol and lisinopril - holding lisinopril and furosemide - discontinue nitro paste  3. Diabetes mellitus type II with chronic kidney disease: hemoglobin A1c 7% on 04/11/2015. Not currently on any agents. Glucosuria on urinalysis. Pending hemoglobin A1c  4. Gastroenteritis:  asymptomatic this morning eating fast food.  - on pip/tazo for enteritis   LOS: 0 Chancey Ringel 6/17/201810:40 AM

## 2017-06-16 NOTE — Progress Notes (Signed)
Patient reminded that she still needs a urine specimen. Patient has urine from earlier in cup. Patient educated it needs to be fresh. Patient told to call nurse when she gets up to the bathroom so specimen can be collected.

## 2017-06-16 NOTE — Progress Notes (Signed)
[facility] [place] NAME: Renee [name]    MR#:  [id number]  DATE OF BIRTH:  1976/08/24  SUBJECTIVE:   Patient doing well this am  No abdominal pain or nausea  REVIEW OF SYSTEMS:    Review of Systems  Constitutional: Negative for fever, chills weight loss HENT: Negative for ear pain, nosebleeds, congestion, facial swelling, rhinorrhea, neck pain, neck stiffness and ear discharge.   Respiratory: Negative for cough, shortness of breath, wheezing  Cardiovascular: Negative for chest pain, palpitations  Gastrointestinal: Negative for heartburn, abdominal pain, vomiting, diarrhea or consitpation Genitourinary: Negative for dysuria, urgency, frequency, hematuria Musculoskeletal: Negative for back pain or joint pain Neurological: Negative for dizziness, seizures, syncope, focal weakness,  numbness and headaches.  Hematological: Does not bruise/bleed easily.  Psychiatric/Behavioral: Negative for hallucinations, confusion, dysphoric mood    Tolerating Diet: yes      DRUG ALLERGIES:  No Known Allergies  VITALS:  Blood pressure (!) 158/82, pulse 76, temperature 97.6 F (36.4 C), temperature source Oral, resp. rate 18, height 5\' 4"  (1.626 m), weight 113.8 kg (250 lb 12.8 oz), last menstrual period 06/07/2017, SpO2 96 %.  PHYSICAL EXAMINATION:  Constitutional: Appears well-developed and well-nourished. No distress. HENT: Normocephalic. Marland Kitchen Oropharynx is clear and moist.  Eyes: Conjunctivae and EOM are normal. PERRLA, no scleral icterus.  Neck: Normal ROM. Neck supple. No JVD. No tracheal deviation. CVS: RRR, S1/S2 +, no murmurs, no gallops, no carotid bruit.  Pulmonary: Effort and breath sounds normal, no stridor, rhonchi, wheezes, rales.  Abdominal: Soft. BS +,  no distension, tenderness, rebound or guarding.  Musculoskeletal: Normal range of motion. Some LEE Neuro: Alert. CN 2-12 grossly intact. No focal deficits. Skin: Skin is warm and  dry. No rash noted. Psychiatric: Normal mood and affect.      LABORATORY PANEL:   CBC  Recent Labs Lab 06/15/17 0402  WBC 16.8*  HGB 13.7  HCT 41.5  PLT 311   ------------------------------------------------------------------------------------------------------------------  Chemistries   Recent Labs Lab 06/14/17 1503  06/15/17 0714 06/16/17 0415  NA 130*  < >  --  132*  K 3.1*  < >  --  3.4*  CL 97*  < >  --  100*  CO2 23  < >  --  24  GLUCOSE 203*  < >  --  139*  BUN 48*  < >  --  52*  CREATININE 2.88*  < >  --  3.18*  CALCIUM 9.0  < >  --  8.2*  MG  --   < > 1.9  --   AST 19  --   --   --   ALT 19  --   --   --   ALKPHOS 86  --   --   --   BILITOT 0.8  --   --   --   < > = values in this interval not displayed. ------------------------------------------------------------------------------------------------------------------  Cardiac Enzymes  Recent Labs Lab 06/14/17 1503 06/15/17 0057 06/15/17 0657  TROPONINI 0.06* 0.06* 0.06*   ------------------------------------------------------------------------------------------------------------------  RADIOLOGY:  Ct Abdomen Pelvis Wo Contrast  Result Date: 06/14/2017 CLINICAL DATA:  Vomiting since yesterday. EXAM: CT ABDOMEN AND PELVIS WITHOUT CONTRAST TECHNIQUE: Multidetector CT imaging of the abdomen and pelvis was performed following the standard protocol without IV contrast. COMPARISON:  None. FINDINGS: Lower chest: The lung bases are clear of acute process. No pleural effusion or pulmonary lesions. The heart is borderline enlarged. No pericardial effusion. The distal esophagus  and aorta are unremarkable. Hepatobiliary: No focal hepatic lesions or intrahepatic biliary dilatation. The gallbladder is grossly normal. No common bile duct dilatation. Pancreas: No mass, inflammation or ductal dilatation. Spleen: Normal size.  No focal lesions. Adrenals/Urinary Tract: The adrenal glands and kidneys are unremarkable. No  renal, ureteral or bladder calculi or mass. Stomach/Bowel: The stomach cough duodenum bulb in C-loop appear normal. There is moderate wall thickening and inflammation surrounding the proximal loops of jejunum with edema in the small bowel mesenteric. Findings likely due to an inflammatory or infectious process. Feeding distal small bowel and colon are unremarkable. Scattered radiodensities throughout the small bowel and colon possibly due to ingested material such as Pepto-Bismol. The terminal ileum and appendix are normal. Vascular/Lymphatic: The aorta is normal in caliber. No atherosclerotic calcifications. Small scattered mesenteric and retroperitoneal lymph nodes but no mass or overt adenopathy. Reproductive: The uterus and ovaries are unremarkable. There is a small cyst associated with the right ovary. Other: No pelvic mass or adenopathy. No free pelvic fluid collections. No inguinal mass or adenopathy. No abdominal wall hernia or subcutaneous lesions. Musculoskeletal: No significant bony findings. IMPRESSION: 1. Thickened at loops of proximal jejunum and mild inflammatory changes in the adjacent mesenteric likely due to un inflammatory or infectious process. No obstruction. 2. No other significant abdominal/pelvic findings. Electronically Signed   By: Marijo Sanes M.D.   On: 06/14/2017 19:07   US Renal  Result Date: 06/15/2017 CLINICAL DATA:  Evaluate for hydronephrosis. EXAM: RENAL / URINARY TRACT ULTRASOUND COMPLETE COMPARISON:  CT 06/14/2017 FINDINGS: Right Kidney: Length: 9.8 cm. Echogenicity within normal limits. No mass or hydronephrosis visualized. Left Kidney: Length: 9.6 cm. Echogenicity within normal limits. No mass or hydronephrosis visualized. Bladder: Appears normal for degree of bladder distention. IMPRESSION: Normal renal sonogram. Electronically Signed   By: Kerby Moors M.D.   On: 06/15/2017 12:52     ASSESSMENT AND PLAN:   41 year old female with a history of essential  hypertension, iron deficiency anemia, chronic kidney disease stage III who presented with epigastric abdominal pain and found to have accelerated essential hypertension.  1. Accelerated essential hypertension: Patient is now back on her outpatient medications and blood pressure as improved.  Continue hydralazine, labetalol, clonidine  2. Fluid overload/lower extremity edema: Echocardiogram shows normal ejection fraction without diastolic dysfunction. Stop IV Lasix  3. Acute on chronic kidney disease stage III: Creatinine slightly elevated this morning due to Lasix Appreciate nephrology consult Avoid NSAIDs Avoid nephrotoxic medications  Renal ultrasound shows no hydronephrosis  Holding ACE inhibitor due to increased creatinine  Repeat BMP in a.m. and if lower stable patient may be discharged   4. Epigastric abdominal pain with enteritis: Patient evaluated by GI. No further GI recommendations as far as invasive workup. Continue Zosyn for enteritis/jejunitis  5. Elevated troponin due to demand ischemia and poor renal clearance. Patient has ruled out for non-ST elevation MI  6. Leukocytosis due to intra-abdominal pathology This has improved  7. Hyponatremia: Sodium is at 132 TSH 1.4   Management plans discussed with the patient and she is in agreement.  CODE STATUS: full  TOTAL TIME TAKING CARE OF THIS PATIENT: 27 minutes.     POSSIBLE D/C tomorrow, DEPENDING ON CLINICAL CONDITION.   Amariz Flamenco M.D on 06/16/2017 at 9:13 AM  Between 7am to 6pm - Pager - [phone] After 6pm go to [email or web address] - Proofreader  [facility]  Office  [phone]  CC: Primary care physician; [facility]  Note: This  dictation was prepared with Dragon dictation along with smaller phrase technology. Any transcriptional errors that result from this process are unintentional.

## 2017-06-16 NOTE — Progress Notes (Signed)
Gastroenterology Progress Note    Renee [name] 41 y.o. February 02, 1976   Subjective: Sitting in bedside chair eating lunch. Denies abdominal pain, nausea, or vomiting. More alert and in better spirits.  Objective: Vital signs in last 24 hours: Vitals:   06/15/17 2316 06/16/17 0720  BP: 135/61 (!) 158/82  Pulse: 75 76  Resp: 16 18  Temp: 97.6 F (36.4 C) 97.6 F (36.4 C)    Physical Exam: Gen: alert, no acute distress, obese HEENT: anicteric sclera CV: RRR Chest: CTA B Abd: epigastric tenderness with guarding (less tender), soft, nondistended, +BS, obese  Lab Results:  Recent Labs  06/15/17 0057  06/15/17 0714 06/16/17 0415 06/16/17 0942  NA  --   < >  --  132* 131*  K  --   < >  --  3.4* 3.5  CL  --   < >  --  100* 99*  CO2  --   < >  --  24 25  GLUCOSE  --   < >  --  139* 162*  BUN  --   < >  --  52* 50*  CREATININE  --   < >  --  3.18* 3.17*  CALCIUM  --   < >  --  8.2* 8.3*  MG 1.8  --  1.9  --   --   < > = values in this interval not displayed.  Recent Labs  06/14/17 1503  AST 19  ALT 19  ALKPHOS 86  BILITOT 0.8  PROT 6.8  ALBUMIN 3.4*    Recent Labs  06/14/17 1503 06/15/17 0402  WBC 22.4* 16.8*  NEUTROABS 20.0*  --   HGB 16.9* 13.7  HCT 51.7* 41.5  MCV 72.9* 72.3*  PLT 332 311   No results for input(s): LABPROT, INR in the last 72 hours.    Assessment/Plan: Epigastric pain and N/V with CT showing proximal jejunitis who is improving with symptomatic care and IV Abx. Ok to change to Augmentin PO (would avoid Flagyl due to potential GI upset/N/V) at discharge to complete a 10 day course. Tolerating solid food without difficulty. Will sign off. F/U with Dr. Vicente Males as needed.    [name] 06/16/2017, 1:02 PMPatient ID: Renee [name], female   DOB: [birth year], 41 y.o.   MRN: [id number]

## 2017-06-17 LAB — PROTEIN ELECTROPHORESIS, SERUM
A/G RATIO SPE: 1 (ref 0.7–1.7)
ALPHA-1-GLOBULIN: 0.3 g/dL (ref 0.0–0.4)
ALPHA-2-GLOBULIN: 0.5 g/dL (ref 0.4–1.0)
Albumin ELP: 2.6 g/dL — ABNORMAL LOW (ref 2.9–4.4)
BETA GLOBULIN: 0.9 g/dL (ref 0.7–1.3)
GLOBULIN, TOTAL: 2.5 g/dL (ref 2.2–3.9)
Gamma Globulin: 0.9 g/dL (ref 0.4–1.8)
Total Protein ELP: 5.1 g/dL — ABNORMAL LOW (ref 6.0–8.5)

## 2017-06-17 LAB — BASIC METABOLIC PANEL
ANION GAP: 8 (ref 5–15)
BUN: 47 mg/dL — AB (ref 6–20)
CHLORIDE: 101 mmol/L (ref 101–111)
CO2: 24 mmol/L (ref 22–32)
Calcium: 8.2 mg/dL — ABNORMAL LOW (ref 8.9–10.3)
Creatinine, Ser: 3.22 mg/dL — ABNORMAL HIGH (ref 0.44–1.00)
GFR calc Af Amer: 19 mL/min — ABNORMAL LOW (ref >60)
GFR, EST NON AFRICAN AMERICAN: 17 mL/min — AB (ref >60)
GLUCOSE: 200 mg/dL — AB (ref 65–99)
POTASSIUM: 3.2 mmol/L — AB (ref 3.5–5.1)
Sodium: 133 mmol/L — ABNORMAL LOW (ref 135–145)

## 2017-06-17 LAB — GLUCOSE, CAPILLARY: GLUCOSE-CAPILLARY: 141 mg/dL — AB (ref 65–99)

## 2017-06-17 LAB — PROTEIN / CREATININE RATIO, URINE
Creatinine, Urine: 66 mg/dL
Protein Creatinine Ratio: 0.44 mg/mg{Cre} — ABNORMAL HIGH (ref 0.00–0.15)
Total Protein, Urine: 29 mg/dL

## 2017-06-17 MED ORDER — AMOXICILLIN-POT CLAVULANATE 875-125 MG PO TABS: 1.0000 | ORAL_TABLET | Freq: Two times a day (BID) | ORAL | Status: DC

## 2017-06-17 MED ORDER — HYDRALAZINE HCL 100 MG PO TABS: 100.0000 mg | ORAL_TABLET | Freq: Two times a day (BID) | ORAL | 0 refills | Status: DC

## 2017-06-17 MED ORDER — CLONIDINE HCL 0.2 MG PO TABS: 0.2000 mg | ORAL_TABLET | Freq: Two times a day (BID) | ORAL | 0 refills | Status: DC

## 2017-06-17 MED ORDER — LABETALOL HCL 200 MG PO TABS: 200.0000 mg | ORAL_TABLET | Freq: Two times a day (BID) | ORAL | 0 refills | Status: DC

## 2017-06-17 MED ORDER — AMOXICILLIN-POT CLAVULANATE 875-125 MG PO TABS: 1.0000 | ORAL_TABLET | Freq: Two times a day (BID) | ORAL | 0 refills | Status: AC

## 2017-06-17 NOTE — Discharge Summary (Signed)
[facility] [place] NAME: Renee [name]    MR#:  [id number]  DATE OF BIRTH:  Jan 26, 1976  DATE OF ADMISSION:  06/14/2017 ADMITTING PHYSICIAN: Theodoro Grist, MD  DATE OF DISCHARGE: 06/17/2017  PRIMARY CARE PHYSICIAN: [place]    ADMISSION DIAGNOSIS:  Epigastric pain [R10.13] Acute on chronic renal insufficiency [N28.9, N18.9] Hypertensive urgency [I16.0] Non-intractable vomiting with nausea, unspecified vomiting type [R11.2] Malignant essential hypertension [I10]  DISCHARGE DIAGNOSIS:  Active Problems:   Malignant essential hypertension   Acute gastritis   Nausea and vomiting   Acute on chronic renal failure (HCC)   Hyponatremia   Hypokalemia   Leg swelling   Malignant hypertension   SECONDARY DIAGNOSIS:   Past Medical History:  Diagnosis Date  . Hypertension     HOSPITAL COURSE:  41 year old female with a history of essential hypertension, iron deficiency anemia, chronic kidney disease stage III who presented with epigastric abdominal pain and found to have accelerated essential hypertension.  1. Accelerated essential hypertension: Patient is now back on her outpatient medications and blood pressure as improved.  Continue hydralazine, labetalol, clonidine  2. Fluid overload/lower extremity edema: Echocardiogram shows normal ejection fraction without diastolic dysfunction.  3. Acute on chronic kidney disease stage III: She will have outpatient follow up with nephrology. She is advised to avoid NSAIDs and Avoid nephrotoxic medications  Renal ultrasound showed no hydronephrosis  We are holding ACE inhibitor due to increased creatinine     4. Epigastric abdominal pain with enteritis: Patient was evaluated by GI. No further GI recommendations as far as invasive workup. They are Recommending discharging  patient on Augmentin for treatment of jejunitis/enteritis  5. Elevated troponin due to demand  ischemia and poor renal clearance. Patient has ruled out for non-ST elevation MI  6. Leukocytosis due to intra-abdominal pathology This has improved  7. Hyponatremia: Sodium level has improved. TSH was within normal limits.  DISCHARGE CONDITIONS AND DIET:   Patient still for discharge on cardiac diet  CONSULTS OBTAINED:  Treatment Team:  Teodoro Spray, MD Lavonia Dana, MD  DRUG ALLERGIES:  No Known Allergies  DISCHARGE MEDICATIONS:   Current Discharge Medication List    START taking these medications   Details  amoxicillin-clavulanate (AUGMENTIN) 875-125 MG tablet Take 1 tablet by mouth every 12 (twelve) hours. Qty: 12 tablet, Refills: 0      CONTINUE these medications which have CHANGED   Details  cloNIDine (CATAPRES) 0.2 MG tablet Take 1 tablet (0.2 mg total) by mouth 2 (two) times daily. Qty: 180 tablet, Refills: 0    hydrALAZINE (APRESOLINE) 100 MG tablet Take 1 tablet (100 mg total) by mouth 2 (two) times daily. Qty: 60 tablet, Refills: 0    labetalol (NORMODYNE) 200 MG tablet Take 1 tablet (200 mg total) by mouth 2 (two) times daily. Qty: 30 tablet, Refills: 0      STOP taking these medications     lisinopril (PRINIVIL,ZESTRIL) 40 MG tablet      furosemide (LASIX) 20 MG tablet           Today   CHIEF COMPLAINT:  She doing well this morning. No shortness of breath   VITAL SIGNS:  Blood pressure (!) 175/87, pulse 71, temperature 97.8 F (36.6 C), temperature source Oral, resp. rate 18, height 5\' 4"  (1.626 m), weight 113.8 kg (250 lb 12.8 oz), last menstrual period 06/07/2017, SpO2 99 %.   REVIEW OF SYSTEMS:  Review of Systems  Constitutional: Negative.  Negative for chills, fever and malaise/fatigue.  HENT: Negative.  Negative for ear discharge, ear pain, hearing loss, nosebleeds and sore throat.   Eyes: Negative.  Negative for blurred vision and pain.  Respiratory: Negative.  Negative for cough, hemoptysis, shortness of breath and  wheezing.   Cardiovascular: Positive for leg swelling. Negative for chest pain and palpitations.  Gastrointestinal: Negative.  Negative for abdominal pain, blood in stool, diarrhea, nausea and vomiting.  Genitourinary: Negative.  Negative for dysuria.  Musculoskeletal: Negative.  Negative for back pain.  Skin: Negative.   Neurological: Negative for dizziness, tremors, speech change, focal weakness, seizures and headaches.  Endo/Heme/Allergies: Negative.  Does not bruise/bleed easily.  Psychiatric/Behavioral: Negative.  Negative for depression, hallucinations and suicidal ideas.     PHYSICAL EXAMINATION:  GENERAL:  41 y.o.-year-old patient lying in the bed with no acute distress.  NECK:  Supple, no jugular venous distention. No thyroid enlargement, no tenderness.  LUNGS: Normal breath sounds bilaterally, no wheezing, rales,rhonchi  No use of accessory muscles of respiration.  CARDIOVASCULAR: S1, S2 normal. No murmurs, rubs, or gallops.  ABDOMEN: Soft, non-tender, non-distended. Bowel sounds present. No organomegaly or mass.  EXTREMITIES: 1+pedal edema, cyanosis, or clubbing.  PSYCHIATRIC: The patient is alert and oriented x 3.  SKIN: No obvious rash, lesion, or ulcer.   DATA REVIEW:   CBC  Recent Labs Lab 06/15/17 0402  WBC 16.8*  HGB 13.7  HCT 41.5  PLT 311    Chemistries   Recent Labs Lab 06/14/17 1503  06/15/17 0714  06/17/17 0351  NA 130*  < >  --   < > 133*  K 3.1*  < >  --   < > 3.2*  CL 97*  < >  --   < > 101  CO2 23  < >  --   < > 24  GLUCOSE 203*  < >  --   < > 200*  BUN 48*  < >  --   < > 47*  CREATININE 2.88*  < >  --   < > 3.22*  CALCIUM 9.0  < >  --   < > 8.2*  MG  --   < > 1.9  --   --   AST 19  --   --   --   --   ALT 19  --   --   --   --   ALKPHOS 86  --   --   --   --   BILITOT 0.8  --   --   --   --   < > = values in this interval not displayed.  Cardiac Enzymes  Recent Labs Lab 06/14/17 1503 06/15/17 0057 06/15/17 0657  TROPONINI  0.06* 0.06* 0.06*    Microbiology Results  @[email or web address] @  RADIOLOGY:  US Renal  Result Date: 06/15/2017 CLINICAL DATA:  Evaluate for hydronephrosis. EXAM: RENAL / URINARY TRACT ULTRASOUND COMPLETE COMPARISON:  CT 06/14/2017 FINDINGS: Right Kidney: Length: 9.8 cm. Echogenicity within normal limits. No mass or hydronephrosis visualized. Left Kidney: Length: 9.6 cm. Echogenicity within normal limits. No mass or hydronephrosis visualized. Bladder: Appears normal for degree of bladder distention. IMPRESSION: Normal renal sonogram. Electronically Signed   By: Kerby Moors M.D.   On: 06/15/2017 12:52      Current Discharge Medication List    START taking these medications   Details  amoxicillin-clavulanate (AUGMENTIN) 875-125 MG tablet Take 1 tablet by mouth every 12 (twelve) hours. Qty: 12 tablet, Refills: 0  CONTINUE these medications which have CHANGED   Details  cloNIDine (CATAPRES) 0.2 MG tablet Take 1 tablet (0.2 mg total) by mouth 2 (two) times daily. Qty: 180 tablet, Refills: 0    hydrALAZINE (APRESOLINE) 100 MG tablet Take 1 tablet (100 mg total) by mouth 2 (two) times daily. Qty: 60 tablet, Refills: 0    labetalol (NORMODYNE) 200 MG tablet Take 1 tablet (200 mg total) by mouth 2 (two) times daily. Qty: 30 tablet, Refills: 0      STOP taking these medications     lisinopril (PRINIVIL,ZESTRIL) 40 MG tablet      furosemide (LASIX) 20 MG tablet            Management plans discussed with the patient and she is in agreement. Stable for discharge home  Patient should follow up with pcp  CODE STATUS:     Code Status Orders        Start     Ordered   06/15/17 0038  Full code  Continuous     06/15/17 0038    Code Status History    Date Active Date Inactive Code Status Order ID Comments User Context   12/20/2016 12:21 PM 12/21/2016  5:53 PM Full Code [id number]  Bettey Costa, MD ED      TOTAL TIME TAKING CARE OF THIS PATIENT: 38 minutes.    Note: This  dictation was prepared with Dragon dictation along with smaller phrase technology. Any transcriptional errors that result from this process are unintentional.  Ventura Hollenbeck M.D on 06/17/2017 at 9:41 AM  Between 7am to 6pm - Pager - [phone] After 6pm go to [email or web address] - password [email or web address] [facility]  Office  [phone]  CC: Primary care physician; [facility]

## 2017-06-17 NOTE — Progress Notes (Signed)
Pt states she is Ready for discharge home with famiily. Reviewed all d/c instructions, prescriptions and f/u appts. No further complaints or questions.

## 2017-07-04 ENCOUNTER — Emergency Department: Payer: Medicare Other

## 2017-07-04 ENCOUNTER — Inpatient Hospital Stay
Admission: EM | Admit: 2017-07-04 | Discharge: 2017-07-09 | DRG: 193 | Disposition: A | Discharge status: Home / Self Care | Payer: Medicare Other | Attending: Internal Medicine | Admitting: Internal Medicine

## 2017-07-04 DIAGNOSIS — I160 Hypertensive urgency: Secondary | ICD-10-CM | POA: Diagnosis not present

## 2017-07-04 DIAGNOSIS — I5031 Acute diastolic (congestive) heart failure: Secondary | ICD-10-CM | POA: Diagnosis present

## 2017-07-04 DIAGNOSIS — R51 Headache: Secondary | ICD-10-CM | POA: Diagnosis present

## 2017-07-04 DIAGNOSIS — I161 Hypertensive emergency: Secondary | ICD-10-CM | POA: Diagnosis not present

## 2017-07-04 DIAGNOSIS — I110 Hypertensive heart disease with heart failure: Secondary | ICD-10-CM | POA: Diagnosis not present

## 2017-07-04 DIAGNOSIS — N19 Unspecified kidney failure: Secondary | ICD-10-CM

## 2017-07-04 DIAGNOSIS — E1122 Type 2 diabetes mellitus with diabetic chronic kidney disease: Secondary | ICD-10-CM

## 2017-07-04 DIAGNOSIS — R0902 Hypoxemia: Secondary | ICD-10-CM

## 2017-07-04 DIAGNOSIS — Z6841 Body Mass Index (BMI) 40.0 and over, adult: Secondary | ICD-10-CM

## 2017-07-04 DIAGNOSIS — I509 Heart failure, unspecified: Secondary | ICD-10-CM

## 2017-07-04 DIAGNOSIS — Z9851 Tubal ligation status: Secondary | ICD-10-CM

## 2017-07-04 DIAGNOSIS — I130 Hypertensive heart and chronic kidney disease with heart failure and stage 1 through stage 4 chronic kidney disease, or unspecified chronic kidney disease: Secondary | ICD-10-CM | POA: Diagnosis present

## 2017-07-04 DIAGNOSIS — Z8249 Family history of ischemic heart disease and other diseases of the circulatory system: Secondary | ICD-10-CM

## 2017-07-04 DIAGNOSIS — T463X5A Adverse effect of coronary vasodilators, initial encounter: Secondary | ICD-10-CM | POA: Diagnosis not present

## 2017-07-04 DIAGNOSIS — Y92230 Patient room in hospital as the place of occurrence of the external cause: Secondary | ICD-10-CM | POA: Diagnosis not present

## 2017-07-04 DIAGNOSIS — J189 Pneumonia, unspecified organism: Secondary | ICD-10-CM | POA: Diagnosis not present

## 2017-07-04 DIAGNOSIS — Z833 Family history of diabetes mellitus: Secondary | ICD-10-CM

## 2017-07-04 DIAGNOSIS — N184 Chronic kidney disease, stage 4 (severe): Secondary | ICD-10-CM | POA: Diagnosis present

## 2017-07-04 DIAGNOSIS — R0602 Shortness of breath: Secondary | ICD-10-CM | POA: Diagnosis not present

## 2017-07-04 DIAGNOSIS — N179 Acute kidney failure, unspecified: Secondary | ICD-10-CM | POA: Diagnosis present

## 2017-07-04 DIAGNOSIS — Z79899 Other long term (current) drug therapy: Secondary | ICD-10-CM

## 2017-07-04 MED ORDER — DEXTROSE 5 % IV SOLN
500.0000 mg | Freq: Once | INTRAVENOUS | Status: AC
  Administered 2017-07-05: 500 mg via INTRAVENOUS
  Filled 2017-07-04: qty 500

## 2017-07-04 MED ORDER — DEXTROSE 5 % IV SOLN
1.0000 g | Freq: Once | INTRAVENOUS | Status: AC
  Administered 2017-07-05: 1 g via INTRAVENOUS
  Filled 2017-07-04: qty 10

## 2017-07-04 MED ORDER — NITROGLYCERIN 2 % TD OINT
1.0000 [in_us] | TOPICAL_OINTMENT | Freq: Once | TRANSDERMAL | Status: AC
  Administered 2017-07-05: 1 [in_us] via TOPICAL
  Filled 2017-07-04: qty 1

## 2017-07-04 NOTE — ED Notes (Addendum)
Pt states that she has felt sick since Friday and it's gotten worse. Coughing, runny nose, SOB, and HA. States she cant' sleep. Pt is on blood pressure medication which she states that she takes regularly. Pt states that she has had Pneumonia twice this year. Main complaint is in chest area. Family at the bedside.

## 2017-07-04 NOTE — ED Triage Notes (Signed)
Pt in with co shob for few days and chest tightness. Hx of pneumonia, states has had productive cough. Bilat pedal edema noted, slight shob noted in triage.

## 2017-07-05 ENCOUNTER — Inpatient Hospital Stay: Payer: Medicare Other

## 2017-07-05 ENCOUNTER — Inpatient Hospital Stay
Admit: 2017-07-05 | Discharge: 2017-07-05 | Disposition: A | Discharge status: Home / Self Care | Payer: Medicare Other | Attending: Internal Medicine | Admitting: Internal Medicine

## 2017-07-05 DIAGNOSIS — E119 Type 2 diabetes mellitus without complications: Secondary | ICD-10-CM | POA: Diagnosis not present

## 2017-07-05 DIAGNOSIS — T463X5A Adverse effect of coronary vasodilators, initial encounter: Secondary | ICD-10-CM | POA: Diagnosis not present

## 2017-07-05 DIAGNOSIS — Z9851 Tubal ligation status: Secondary | ICD-10-CM | POA: Diagnosis not present

## 2017-07-05 DIAGNOSIS — R600 Localized edema: Secondary | ICD-10-CM | POA: Diagnosis not present

## 2017-07-05 DIAGNOSIS — J189 Pneumonia, unspecified organism: Secondary | ICD-10-CM | POA: Diagnosis not present

## 2017-07-05 DIAGNOSIS — R0902 Hypoxemia: Secondary | ICD-10-CM | POA: Diagnosis present

## 2017-07-05 DIAGNOSIS — R809 Proteinuria, unspecified: Secondary | ICD-10-CM | POA: Diagnosis not present

## 2017-07-05 DIAGNOSIS — I161 Hypertensive emergency: Secondary | ICD-10-CM

## 2017-07-05 DIAGNOSIS — Z79899 Other long term (current) drug therapy: Secondary | ICD-10-CM | POA: Diagnosis not present

## 2017-07-05 DIAGNOSIS — I10 Essential (primary) hypertension: Secondary | ICD-10-CM | POA: Diagnosis not present

## 2017-07-05 DIAGNOSIS — N19 Unspecified kidney failure: Secondary | ICD-10-CM | POA: Diagnosis not present

## 2017-07-05 DIAGNOSIS — I5031 Acute diastolic (congestive) heart failure: Secondary | ICD-10-CM | POA: Diagnosis present

## 2017-07-05 DIAGNOSIS — Z6841 Body Mass Index (BMI) 40.0 and over, adult: Secondary | ICD-10-CM | POA: Diagnosis not present

## 2017-07-05 DIAGNOSIS — R079 Chest pain, unspecified: Secondary | ICD-10-CM | POA: Diagnosis not present

## 2017-07-05 DIAGNOSIS — R0689 Other abnormalities of breathing: Secondary | ICD-10-CM | POA: Diagnosis not present

## 2017-07-05 DIAGNOSIS — I160 Hypertensive urgency: Secondary | ICD-10-CM | POA: Diagnosis present

## 2017-07-05 DIAGNOSIS — Z8249 Family history of ischemic heart disease and other diseases of the circulatory system: Secondary | ICD-10-CM | POA: Diagnosis not present

## 2017-07-05 DIAGNOSIS — E1122 Type 2 diabetes mellitus with diabetic chronic kidney disease: Secondary | ICD-10-CM | POA: Diagnosis not present

## 2017-07-05 DIAGNOSIS — I130 Hypertensive heart and chronic kidney disease with heart failure and stage 1 through stage 4 chronic kidney disease, or unspecified chronic kidney disease: Secondary | ICD-10-CM | POA: Diagnosis present

## 2017-07-05 DIAGNOSIS — I129 Hypertensive chronic kidney disease with stage 1 through stage 4 chronic kidney disease, or unspecified chronic kidney disease: Secondary | ICD-10-CM | POA: Diagnosis not present

## 2017-07-05 DIAGNOSIS — R51 Headache: Secondary | ICD-10-CM | POA: Diagnosis present

## 2017-07-05 DIAGNOSIS — Z833 Family history of diabetes mellitus: Secondary | ICD-10-CM | POA: Diagnosis not present

## 2017-07-05 DIAGNOSIS — N184 Chronic kidney disease, stage 4 (severe): Secondary | ICD-10-CM | POA: Diagnosis not present

## 2017-07-05 DIAGNOSIS — N179 Acute kidney failure, unspecified: Secondary | ICD-10-CM | POA: Diagnosis not present

## 2017-07-05 DIAGNOSIS — Y92230 Patient room in hospital as the place of occurrence of the external cause: Secondary | ICD-10-CM | POA: Diagnosis not present

## 2017-07-05 DIAGNOSIS — R0602 Shortness of breath: Secondary | ICD-10-CM | POA: Diagnosis not present

## 2017-07-05 LAB — CBC WITH DIFFERENTIAL/PLATELET
BASOS ABS: 0.1 10*3/uL (ref 0–0.1)
BASOS PCT: 1 %
Eosinophils Absolute: 0.3 10*3/uL (ref 0–0.7)
Eosinophils Relative: 3 %
HEMATOCRIT: 37 % (ref 35.0–47.0)
HEMOGLOBIN: 11.8 g/dL — AB (ref 12.0–16.0)
LYMPHS PCT: 16 %
Lymphs Abs: 1.6 10*3/uL (ref 1.0–3.6)
MCH: 23.6 pg — ABNORMAL LOW (ref 26.0–34.0)
MCHC: 31.9 g/dL — ABNORMAL LOW (ref 32.0–36.0)
MCV: 73.8 fL — AB (ref 80.0–100.0)
Monocytes Absolute: 0.4 10*3/uL (ref 0.2–0.9)
Monocytes Relative: 4 %
NEUTROS ABS: 7.3 10*3/uL — AB (ref 1.4–6.5)
NEUTROS PCT: 76 %
Platelets: 385 10*3/uL (ref 150–440)
RBC: 5.01 MIL/uL (ref 3.80–5.20)
RDW: 17.5 % — AB (ref 11.5–14.5)
WBC: 9.6 10*3/uL (ref 3.6–11.0)

## 2017-07-05 LAB — URINALYSIS, ROUTINE W REFLEX MICROSCOPIC
Bilirubin Urine: NEGATIVE
Glucose, UA: NEGATIVE mg/dL
KETONES UR: NEGATIVE mg/dL
Leukocytes, UA: NEGATIVE
Nitrite: NEGATIVE
PROTEIN: 100 mg/dL — AB
RBC / HPF: NONE SEEN RBC/hpf (ref 0–5)
Specific Gravity, Urine: 1.008 (ref 1.005–1.030)
pH: 6 (ref 5.0–8.0)

## 2017-07-05 LAB — COMPREHENSIVE METABOLIC PANEL
ALBUMIN: 3.5 g/dL (ref 3.5–5.0)
ALK PHOS: 77 U/L (ref 38–126)
ALT: 24 U/L (ref 14–54)
AST: 22 U/L (ref 15–41)
Anion gap: 9 (ref 5–15)
BUN: 31 mg/dL — AB (ref 6–20)
CO2: 23 mmol/L (ref 22–32)
CREATININE: 2.47 mg/dL — AB (ref 0.44–1.00)
Calcium: 9.3 mg/dL (ref 8.9–10.3)
Chloride: 107 mmol/L (ref 101–111)
GFR calc Af Amer: 27 mL/min — ABNORMAL LOW (ref >60)
GFR, EST NON AFRICAN AMERICAN: 23 mL/min — AB (ref >60)
GLUCOSE: 131 mg/dL — AB (ref 65–99)
POTASSIUM: 3.4 mmol/L — AB (ref 3.5–5.1)
Sodium: 139 mmol/L (ref 135–145)
TOTAL PROTEIN: 7 g/dL (ref 6.5–8.1)
Total Bilirubin: 0.8 mg/dL (ref 0.3–1.2)

## 2017-07-05 LAB — GLUCOSE, CAPILLARY: Glucose-Capillary: 176 mg/dL — ABNORMAL HIGH (ref 65–99)

## 2017-07-05 LAB — BASIC METABOLIC PANEL
ANION GAP: 8 (ref 5–15)
BUN: 30 mg/dL — ABNORMAL HIGH (ref 6–20)
CHLORIDE: 106 mmol/L (ref 101–111)
CO2: 24 mmol/L (ref 22–32)
Calcium: 9.1 mg/dL (ref 8.9–10.3)
Creatinine, Ser: 2.51 mg/dL — ABNORMAL HIGH (ref 0.44–1.00)
GFR calc Af Amer: 26 mL/min — ABNORMAL LOW (ref >60)
GFR calc non Af Amer: 23 mL/min — ABNORMAL LOW (ref >60)
GLUCOSE: 178 mg/dL — AB (ref 65–99)
POTASSIUM: 3.5 mmol/L (ref 3.5–5.1)
Sodium: 138 mmol/L (ref 135–145)

## 2017-07-05 LAB — CBC
HEMATOCRIT: 36.2 % (ref 35.0–47.0)
HEMOGLOBIN: 11.5 g/dL — AB (ref 12.0–16.0)
MCH: 23.3 pg — AB (ref 26.0–34.0)
MCHC: 31.7 g/dL — AB (ref 32.0–36.0)
MCV: 73.5 fL — AB (ref 80.0–100.0)
Platelets: 389 10*3/uL (ref 150–440)
RBC: 4.92 MIL/uL (ref 3.80–5.20)
RDW: 17.4 % — ABNORMAL HIGH (ref 11.5–14.5)
WBC: 10.2 10*3/uL (ref 3.6–11.0)

## 2017-07-05 LAB — ECHOCARDIOGRAM LIMITED
Height: 65 in
WEIGHTICAEL: 4064 [oz_av]

## 2017-07-05 LAB — MAGNESIUM: MAGNESIUM: 2 mg/dL (ref 1.7–2.4)

## 2017-07-05 LAB — LIPID PANEL
Cholesterol: 159 mg/dL (ref 0–200)
HDL: 32 mg/dL — ABNORMAL LOW (ref >40)
LDL CALC: 105 mg/dL — AB (ref 0–99)
TRIGLYCERIDES: 111 mg/dL (ref <150)
Total CHOL/HDL Ratio: 5 RATIO
VLDL: 22 mg/dL (ref 0–40)

## 2017-07-05 LAB — MRSA PCR SCREENING: MRSA BY PCR: NEGATIVE

## 2017-07-05 LAB — BRAIN NATRIURETIC PEPTIDE: B Natriuretic Peptide: 1712 pg/mL — ABNORMAL HIGH (ref 0.0–100.0)

## 2017-07-05 LAB — LACTIC ACID, PLASMA
LACTIC ACID, VENOUS: 0.8 mmol/L (ref 0.5–1.9)
Lactic Acid, Venous: 1.2 mmol/L (ref 0.5–1.9)

## 2017-07-05 LAB — TSH: TSH: 5.639 u[IU]/mL — ABNORMAL HIGH (ref 0.350–4.500)

## 2017-07-05 LAB — TROPONIN I
TROPONIN I: 0.03 ng/mL — AB (ref <0.03)
TROPONIN I: 0.06 ng/mL — AB (ref <0.03)
Troponin I: 0.04 ng/mL (ref <0.03)
Troponin I: 0.03 ng/mL (ref <0.03)

## 2017-07-05 LAB — PHOSPHORUS: Phosphorus: 3.5 mg/dL (ref 2.5–4.6)

## 2017-07-05 MED ORDER — DEXTROSE 5 % IV SOLN
500.0000 mg | INTRAVENOUS | Status: DC
  Filled 2017-07-05: qty 500

## 2017-07-05 MED ORDER — HYDRALAZINE HCL 50 MG PO TABS
100.0000 mg | ORAL_TABLET | Freq: Two times a day (BID) | ORAL | Status: DC
  Administered 2017-07-05 – 2017-07-08 (×6): 100 mg via ORAL
  Filled 2017-07-05 (×6): qty 2

## 2017-07-05 MED ORDER — SENNOSIDES-DOCUSATE SODIUM 8.6-50 MG PO TABS: 1.0000 | ORAL_TABLET | Freq: Every evening | ORAL | Status: DC | PRN

## 2017-07-05 MED ORDER — ALBUTEROL SULFATE (2.5 MG/3ML) 0.083% IN NEBU: 2.5000 mg | INHALATION_SOLUTION | Freq: Four times a day (QID) | RESPIRATORY_TRACT | Status: DC | PRN

## 2017-07-05 MED ORDER — DEXTROSE 5 % IV SOLN: 500.0000 mg | INTRAVENOUS | Status: DC

## 2017-07-05 MED ORDER — SODIUM CHLORIDE 0.9 % IV SOLN: 250.0000 mL | INTRAVENOUS | Status: DC | PRN

## 2017-07-05 MED ORDER — DEXTROSE 5 % IV SOLN
1.0000 g | INTRAVENOUS | Status: DC
  Administered 2017-07-05 – 2017-07-06 (×2): 1 g via INTRAVENOUS
  Filled 2017-07-05 (×3): qty 10

## 2017-07-05 MED ORDER — BENZONATATE 100 MG PO CAPS
100.0000 mg | ORAL_CAPSULE | Freq: Three times a day (TID) | ORAL | Status: DC | PRN
  Filled 2017-07-05: qty 1

## 2017-07-05 MED ORDER — GUAIFENESIN ER 600 MG PO TB12
600.0000 mg | ORAL_TABLET | Freq: Two times a day (BID) | ORAL | Status: DC
  Administered 2017-07-05 – 2017-07-09 (×9): 600 mg via ORAL
  Filled 2017-07-05 (×10): qty 1

## 2017-07-05 MED ORDER — NITROGLYCERIN IN D5W 200-5 MCG/ML-% IV SOLN
INTRAVENOUS | Status: AC
  Filled 2017-07-05: qty 250

## 2017-07-05 MED ORDER — IPRATROPIUM BROMIDE 0.02 % IN SOLN: 0.5000 mg | Freq: Four times a day (QID) | RESPIRATORY_TRACT | Status: DC | PRN

## 2017-07-05 MED ORDER — HYDRALAZINE HCL 20 MG/ML IJ SOLN
5.0000 mg | Freq: Three times a day (TID) | INTRAMUSCULAR | Status: DC | PRN
  Administered 2017-07-05: 5 mg via INTRAVENOUS
  Filled 2017-07-05: qty 1

## 2017-07-05 MED ORDER — DEXTROMETHORPHAN POLISTIREX ER 30 MG/5ML PO SUER
30.0000 mg | Freq: Two times a day (BID) | ORAL | Status: DC
  Administered 2017-07-05 – 2017-07-07 (×6): 30 mg via ORAL
  Filled 2017-07-05 (×7): qty 5

## 2017-07-05 MED ORDER — NICARDIPINE HCL IN NACL 20-0.86 MG/200ML-% IV SOLN
3.0000 mg/h | INTRAVENOUS | Status: DC
  Administered 2017-07-05: 10 mg/h via INTRAVENOUS
  Administered 2017-07-05: 12.5 mg/h via INTRAVENOUS
  Administered 2017-07-05 (×2): 5 mg/h via INTRAVENOUS
  Administered 2017-07-06: 15 mg/h via INTRAVENOUS
  Administered 2017-07-06: 10 mg/h via INTRAVENOUS
  Filled 2017-07-05 (×8): qty 200

## 2017-07-05 MED ORDER — OXYCODONE HCL 5 MG PO TABS: 5.0000 mg | ORAL_TABLET | ORAL | Status: DC | PRN

## 2017-07-05 MED ORDER — DM-GUAIFENESIN ER 30-600 MG PO TB12
1.0000 | ORAL_TABLET | Freq: Two times a day (BID) | ORAL | Status: DC
Start: 2017-07-05 — End: 2017-07-05

## 2017-07-05 MED ORDER — AZITHROMYCIN 250 MG PO TABS
250.0000 mg | ORAL_TABLET | Freq: Every day | ORAL | Status: DC
  Administered 2017-07-05 – 2017-07-07 (×3): 250 mg via ORAL
  Filled 2017-07-05 (×3): qty 1

## 2017-07-05 MED ORDER — ONDANSETRON HCL 4 MG PO TABS
4.0000 mg | ORAL_TABLET | Freq: Four times a day (QID) | ORAL | Status: DC | PRN
Start: 2017-07-05 — End: 2017-07-09

## 2017-07-05 MED ORDER — CLONIDINE HCL 0.1 MG PO TABS
0.2000 mg | ORAL_TABLET | Freq: Two times a day (BID) | ORAL | Status: DC
  Administered 2017-07-05 – 2017-07-08 (×7): 0.2 mg via ORAL
  Filled 2017-07-05 (×7): qty 2

## 2017-07-05 MED ORDER — HYDRALAZINE HCL 20 MG/ML IJ SOLN
10.0000 mg | Freq: Once | INTRAMUSCULAR | Status: AC
  Administered 2017-07-05: 10 mg via INTRAVENOUS
  Filled 2017-07-05 (×2): qty 1

## 2017-07-05 MED ORDER — ONDANSETRON HCL 4 MG/2ML IJ SOLN: 4.0000 mg | Freq: Four times a day (QID) | INTRAMUSCULAR | Status: DC | PRN

## 2017-07-05 MED ORDER — LABETALOL HCL 200 MG PO TABS
200.0000 mg | ORAL_TABLET | Freq: Two times a day (BID) | ORAL | Status: DC
  Administered 2017-07-05 (×2): 200 mg via ORAL
  Filled 2017-07-05 (×3): qty 1

## 2017-07-05 MED ORDER — NITROGLYCERIN IN D5W 200-5 MCG/ML-% IV SOLN
0.0000 ug/min | INTRAVENOUS | Status: DC
  Administered 2017-07-05: 5 ug/min via INTRAVENOUS
  Administered 2017-07-05: 15 ug/min via INTRAVENOUS

## 2017-07-05 MED ORDER — BISACODYL 5 MG PO TBEC: 5.0000 mg | DELAYED_RELEASE_TABLET | Freq: Every day | ORAL | Status: DC | PRN

## 2017-07-05 MED ORDER — SODIUM CHLORIDE 0.9% FLUSH
3.0000 mL | Freq: Two times a day (BID) | INTRAVENOUS | Status: DC
  Administered 2017-07-05 – 2017-07-08 (×7): 3 mL via INTRAVENOUS

## 2017-07-05 MED ORDER — LABETALOL HCL 5 MG/ML IV SOLN
10.0000 mg | Freq: Once | INTRAVENOUS | Status: AC
  Administered 2017-07-05: 10 mg via INTRAVENOUS
  Filled 2017-07-05: qty 4

## 2017-07-05 MED ORDER — ACETAMINOPHEN 325 MG PO TABS: 650.0000 mg | ORAL_TABLET | Freq: Four times a day (QID) | ORAL | Status: DC | PRN

## 2017-07-05 MED ORDER — ACETAMINOPHEN 650 MG RE SUPP: 650.0000 mg | Freq: Four times a day (QID) | RECTAL | Status: DC | PRN

## 2017-07-05 MED ORDER — MAGNESIUM CITRATE PO SOLN
1.0000 | Freq: Once | ORAL | Status: DC | PRN
  Filled 2017-07-05: qty 296

## 2017-07-05 MED ORDER — SODIUM CHLORIDE 0.9% FLUSH: 3.0000 mL | Freq: Two times a day (BID) | INTRAVENOUS | Status: DC

## 2017-07-05 MED ORDER — SODIUM CHLORIDE 0.9% FLUSH: 3.0000 mL | INTRAVENOUS | Status: DC | PRN

## 2017-07-05 NOTE — Plan of Care (Signed)
Problem: Food- and Nutrition-Related Knowledge Deficit (NB-1.1) Goal: Nutrition education Formal process to instruct or train a patient/client in a skill or to impart knowledge to help patients/clients voluntarily manage or modify food choices and eating behavior to maintain or improve health. Outcome: Completed/Met Date Met: 07/05/17 Nutrition Education Note  RD consulted for nutrition education regarding a Heart Healthy diet.   Lipid Panel     Component Value Date/Time   CHOL 159 07/05/2017 0622   TRIG 111 07/05/2017 0622   HDL 32 (L) 07/05/2017 0622   CHOLHDL 5.0 07/05/2017 0622   VLDL 22 07/05/2017 0622   LDLCALC 105 (H) 07/05/2017 0622    RD provided "Heart Healthy Nutrition Therapy" handout from the Academy of [employer]. Reviewed patient's dietary recall. Provided examples on ways to decrease sodium and fat intake in diet. Discouraged intake of processed foods and use of salt shaker. Encouraged fresh fruits and vegetables as well as whole grain sources of carbohydrates to maximize fiber intake. Teach back method used.  Expect fair compliance.  Body mass index is 42.27 kg/m. Pt meets criteria for obese class III based on current BMI.  Current diet order is heart healthy, patient is consuming approximately 50% of meals at this time. Labs and medications reviewed. No further nutrition interventions warranted at this time. RD contact information provided. If additional nutrition issues arise, please re-consult RD.  Satira Anis. Laini Urick, MS, RD LDN Inpatient Clinical Dietitian Pager [phone]

## 2017-07-05 NOTE — Care Management (Signed)
Admitted from home with pneumonia and placed in icu stepdown due to elevated blood pressure requiring nitro drip. Independent in all adls, denies issues accessing medical care, obtaining medications or with transportation. 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  Current with her PCP.

## 2017-07-05 NOTE — Progress Notes (Signed)
*  PRELIMINARY RESULTS* Echocardiogram 2D Echocardiogram has been performed.  Sherrie Sport 07/05/2017, 10:38 AM

## 2017-07-05 NOTE — Consult Note (Signed)
Name: Renee [name] MRN: [id number] DOB: 09-23-1976    ADMISSION DATE:  07/04/2017  CONSULTATION DATE:  07/05/17  REFERRING MD :  Dr. Estanislado Pandy  CHIEF COMPLAINT:  Hypertensive Urgency  BRIEF PATIENT DESCRIPTION: 41 year old female hypertensive urgency  SIGNIFICANT EVENTS  07/05/17>> Patient admitted to the ICU with hypertensive urgency  STUDIES:  06/15/17 ECHO>  moderate LVH. There was moderate concentric hypertrophy. Systolic   function was normal. The estimated ejection fraction was in the   range of 50% to 55%   HISTORY OF PRESENT ILLNESS:  Renee [name] is a 41 yo female with a known history of hypertension.  Patient presents to ED on 7/6 with shortness of breath.  Patient was apparently well until 6 days ago when she developed some respiratory infection including sinus congestion,rhinorrhea,headache and generalized fatigue.  Her symptoms progressively worsened therefore decided to come to hospital.  Patient was noted to have elevated BP,SBP Iin 259 .  Patient was started on Nitroglycerin drip and was sent to the ICU for closer monitoring.  PAST MEDICAL HISTORY :   has a past medical history of Hypertension.  has a past surgical history that includes Cesarean section and Tubal ligation. Prior to Admission medications   Medication Sig Start Date End Date Taking? Authorizing Provider  cloNIDine (CATAPRES) 0.2 MG tablet Take 1 tablet (0.2 mg total) by mouth 2 (two) times [name]. 06/17/17   Bettey Costa, MD  hydrALAZINE (APRESOLINE) 100 MG tablet Take 1 tablet (100 mg total) by mouth 2 (two) times [name]. 06/17/17   Bettey Costa, MD  labetalol (NORMODYNE) 200 MG tablet Take 1 tablet (200 mg total) by mouth 2 (two) times [name]. 06/17/17   Bettey Costa, MD   No Known Allergies  FAMILY HISTORY:  family history is not on file. SOCIAL HISTORY:  reports that she has never smoked. She has never used smokeless tobacco. She reports that she does not drink alcohol or use drugs.  REVIEW OF SYSTEMS:    Constitutional: Negative for fever, chills, weight loss, malaise/fatigue and diaphoresis.  HENT: Negative for hearing loss, ear pain, nosebleeds, congestion, sore throat, neck pain, tinnitus and ear discharge.   Eyes: Negative for blurred vision, double vision, photophobia, pain, discharge and redness.  Respiratory: Negative for cough, hemoptysis, sputum production, shortness of breath, wheezing and stridor.   Cardiovascular: Negative for chest pain, palpitations, orthopnea, claudication, leg swelling and PND.  Gastrointestinal: Negative for heartburn, nausea, vomiting, abdominal pain, diarrhea, constipation, blood in stool and melena.  Genitourinary: Negative for dysuria, urgency, frequency, hematuria and flank pain.  Musculoskeletal: Negative for myalgias, back pain, joint pain and falls.  Skin: Negative for itching and rash.  Neurological: Negative for dizziness, tingling, tremors, sensory change, speech change, focal weakness, seizures, loss of consciousness, weakness and headaches.  Endo/Heme/Allergies: Negative for environmental allergies and polydipsia. Does not bruise/bleed easily.  SUBJECTIVE: Patient states that "she feels better and does not have any chest pain at this time"  VITAL SIGNS: Temp:  [98.9 F (37.2 C)] 98.9 F (37.2 C) (07/05 2239) Pulse Rate:  [81-92] 92 (07/06 0522) Resp:  [18-30] 18 (07/06 0522) BP: (205-261)/(108-167) 207/125 (07/06 0522) SpO2:  [93 %-99 %] 97 % (07/06 0522) Weight:  [254 lb (115.2 kg)] 254 lb (115.2 kg) (07/05 2241)  PHYSICAL EXAMINATION: General: 41 yo AA female, in no acute distress Neuro:  Awake,alert and oriented HEENT:  AT,[place],No jvd Cardiovascular:  S1s2,regular,no m/r/g noted Lungs:  Diminished right basilar, no wheezes,crackles,rhonchi Abdomen: soft, NT,ND Musculoskeletal:  No edema,cyanosis  noted  Skin:  Warm,dry and intact   Recent Labs Lab 07/05/17 0011  NA 139  K 3.4*  CL 107  CO2 23  BUN 31*  CREATININE 2.47*    GLUCOSE 131*    Recent Labs Lab 07/05/17 0011  HGB 11.8*  HCT 37.0  WBC 9.6  PLT 385   Dg Chest 2 View  Result Date: 07/04/2017 CLINICAL DATA:  Shortness of breath and chest tightness EXAM: CHEST  2 VIEW COMPARISON:  Chest radiograph 12/20/2016 FINDINGS: Cardiomegaly is unchanged. No pulmonary edema. No pleural effusion or pneumothorax. There is increased airspace opacity in the right lower lobe. IMPRESSION: 1. Increased airspace opacity in the right lower lobe may indicate developing consolidation, including the possibility of pneumonia. 2. Unchanged cardiomegaly. Electronically Signed   By: Ulyses Jarred M.D.   On: 07/04/2017 23:50    ASSESSMENT / PLAN:  Hypertensive Emergency with chest pain Right Lower lobe PNA  Plan Nitro gtt,titrate Trend Troponins Check lipid panel and TSH Continue home dose clonidine, hydralazine and labetalol Continue Ceftriaxone and Azithromycin Bronchodilators F/u Cultures   Bincy Varughese,AG-ACNP Pulmonary and [place]   07/05/2017, 5:50 AM

## 2017-07-05 NOTE — Progress Notes (Signed)
Pt care assumed at this time.  Pt found resting in bed hypertensive and reporting headache.  Pt's NTG gtt increased, and assisted to bedside commode.  Dr. Jefferson Fuel at bedside to see patient, and reports he will change NTG gtt to different medication.

## 2017-07-05 NOTE — Progress Notes (Addendum)
[facility] [place] NAME: Renee [name]    MR#:  [id number]  DATE OF BIRTH:  1976-01-15  SUBJECTIVE:   Came in with increasing shortness of breath cough and chills found to have elevated blood pressure and right lower lobe pneumonia  REVIEW OF SYSTEMS:   Review of Systems  Constitutional: Positive for chills. Negative for fever and weight loss.  HENT: Negative for ear discharge, ear pain and nosebleeds.   Eyes: Negative for blurred vision, pain and discharge.  Respiratory: Positive for shortness of breath. Negative for sputum production, wheezing and stridor.   Cardiovascular: Negative for chest pain, palpitations, orthopnea and PND.  Gastrointestinal: Negative for abdominal pain, diarrhea, nausea and vomiting.  Genitourinary: Negative for frequency and urgency.  Musculoskeletal: Negative for back pain and joint pain.  Neurological: Positive for weakness. Negative for sensory change, speech change and focal weakness.  Psychiatric/Behavioral: Negative for depression and hallucinations. The patient is not nervous/anxious.    Tolerating Diet: Tolerating PT:   DRUG ALLERGIES:  No Known Allergies  VITALS:  Blood pressure (!) 143/66, pulse 99, temperature 97.6 F (36.4 C), temperature source Oral, resp. rate (!) 23, height 5\' 5"  (1.651 m), weight 115.2 kg (254 lb), last menstrual period 05/27/2017, SpO2 96 %.  PHYSICAL EXAMINATION:   Physical Exam  GENERAL:  41 y.o.-year-old patient lying in the bed with no acute distress. Obese  EYES: Pupils equal, round, reactive to light and accommodation. No scleral icterus. Extraocular muscles intact.  HEENT: Head atraumatic, normocephalic. Oropharynx and nasopharynx clear.  NECK:  Supple, no jugular venous distention. No thyroid enlargement, no tenderness.  LUNGS: Normal breath sounds bilaterally, no wheezing, rales, rhonchi. No use of accessory muscles of respiration.  CARDIOVASCULAR: S1,  S2 normal. No murmurs, rubs, or gallops.  ABDOMEN: Soft, nontender, nondistended. Bowel sounds present. No organomegaly or mass.  EXTREMITIES: No cyanosis, clubbing or edema b/l.    NEUROLOGIC: Cranial nerves II through XII are intact. No focal Motor or sensory deficits b/l.   PSYCHIATRIC:  patient is alert and oriented x 3.  SKIN: No obvious rash, lesion, or ulcer.   LABORATORY PANEL:  CBC  Recent Labs Lab 07/05/17 0622  WBC 10.2  HGB 11.5*  HCT 36.2  PLT 389    Chemistries   Recent Labs Lab 07/05/17 0011 07/05/17 0622  NA 139 138  K 3.4* 3.5  CL 107 106  CO2 23 24  GLUCOSE 131* 178*  BUN 31* 30*  CREATININE 2.47* 2.51*  CALCIUM 9.3 9.1  MG  --  2.0  AST 22  --   ALT 24  --   ALKPHOS 77  --   BILITOT 0.8  --    Cardiac Enzymes  Recent Labs Lab 07/05/17 0622  TROPONINI 0.03*   RADIOLOGY:  Dg Chest 2 View  Result Date: 07/04/2017 CLINICAL DATA:  Shortness of breath and chest tightness EXAM: CHEST  2 VIEW COMPARISON:  Chest radiograph 12/20/2016 FINDINGS: Cardiomegaly is unchanged. No pulmonary edema. No pleural effusion or pneumothorax. There is increased airspace opacity in the right lower lobe. IMPRESSION: 1. Increased airspace opacity in the right lower lobe may indicate developing consolidation, including the possibility of pneumonia. 2. Unchanged cardiomegaly. Electronically Signed   By: Ulyses Jarred M.D.   On: 07/04/2017 23:50   US Renal  Result Date: 07/05/2017 CLINICAL DATA:  Renal failure EXAM: RENAL / URINARY TRACT ULTRASOUND COMPLETE COMPARISON:  June 15, 2017 FINDINGS: Right Kidney: Length: 10.6 cm. There  is increased echotexture of the kidney. No mass or hydronephrosis visualized. Left Kidney: Length: 10.1 cm. There is increased echotexture of the kidney. No mass or hydronephrosis visualized. Bladder: Appears normal for degree of bladder distention. IMPRESSION: Increased echotexture of bilateral kidneys, nonspecific but can be seen in medical renal  disease. Electronically Signed   By: Abelardo Diesel M.D.   On: 07/05/2017 09:32   ASSESSMENT AND PLAN:  Yarethzy Croak is a 41 y.o. female with a known history of HTN presents to the emergency department for evaluation of Shortness of breath.  Patient was in a usual state of health until 6 days ago when she developed symptoms of upper respiratory infection including sinus congestion, rhinorrhea, headache and generalized fatigue.    #. Hypertensive emergency with chest pain - Trend troponins, check lipids and TSH. - BP control. Continue clonidine, hydralazine and labetalol -Patient is on IV nicardipine drip. -Echo done in June 2018 showed LVH with moderate concentric hypertrophy and EF 50-55% -2 g sodium diet recommended  #. 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 He she has appointment with Dr. Holley Raring. -Avoid nephrotoxins  #Morbid obesity -Advised lifestyle changes with exercise and diet control -Dietitian to see  #Management for discharge planning -Patient needs a primary care physician.    Case discussed with Care Management/Social Worker. Management plans discussed with the patient, family and they are in agreement.  CODE STATUS: Full  DVT Prophylaxis: Heparin  TOTAL TIME TAKING CARE OF THIS PATIENT:30  minutes.  >50% time spent on counselling and coordination of carePatient and family  POSSIBLE D/C IN 2-3  DAYS, DEPENDING ON CLINICAL CONDITION.  Note: This dictation was prepared with Dragon dictation along with smaller phrase technology. Any transcriptional errors that result from this process are unintentional.  Chilton Sallade M.D on 07/05/2017 at 11:42 AM  Between 7am to 6pm - Pager -  [phone]  After 6pm go to [email or web address] - password [email or web address] [facility]  Office  [phone]  CC: Primary care physician; [facility]

## 2017-07-05 NOTE — ED Provider Notes (Signed)
[facility] Emergency Department Provider Note   ____________________________________________   First MD Initiated Contact with Patient 07/04/17 2318     (approximate)  I have reviewed the triage vital signs and the nursing notes.   HISTORY  Chief Complaint Shortness of Breath    HPI Renee [name] is a 41 y.o. female who presents to the ED from home with a chief complaint of shortness of breath and generalized malaise. Patient has "felt sick" for the past several days with chills, productive cough, runny nose, dyspnea on exertion, generalized malaise and headache. Reports she is on several blood pressure medications. Complains of chest tightness tonight. Denies abdominal pain, nausea, vomiting, dysuria, diarrhea. Denies recent travel or trauma. Nothing makes her symptoms better or worse.   Past Medical History:  Diagnosis Date  . Hypertension     Patient Active Problem List   Diagnosis Date Noted  . 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 Malignant essential hypertension 06/14/2017  . Acute gastritis 06/14/2017  . Nausea and vomiting 06/14/2017  . Acute on chronic renal failure ([place]) 06/14/2017  . Hyponatremia 06/14/2017  . Hypokalemia 06/14/2017  . Leg swelling 06/14/2017  . Cardiomyopathy ([place]) 04/08/2015  . Chronic kidney disease (CKD), stage III (moderate) 04/08/2015  . Accelerated hypertension 04/08/2015  . Diabetes mellitus, type 2 ([place]) 04/08/2015    Past Surgical History:  Procedure Laterality Date  . CESAREAN SECTION    . TUBAL LIGATION      Prior to Admission medications   Medication Sig Start Date End Date Taking? Authorizing Provider  cloNIDine (CATAPRES) 0.2 MG tablet Take 1 tablet (0.2 mg total) by mouth 2 (two) times daily. 06/17/17   Bettey Costa, MD  hydrALAZINE (APRESOLINE) 100 MG tablet Take 1 tablet (100 mg total) by mouth 2 (two) times daily. 06/17/17   Bettey Costa, MD  labetalol (NORMODYNE) 200 MG tablet Take 1 tablet  (200 mg total) by mouth 2 (two) times daily. 06/17/17   Bettey Costa, MD    Allergies Patient has no known allergies.  No family history on file.  Social History Social History  Substance Use Topics  . Smoking status: Never Smoker  . Smokeless tobacco: Never Used  . Alcohol use No    Review of Systems  Constitutional: Positive for chills. Eyes: No visual changes. ENT: No sore throat. Cardiovascular: Positive for chest pain. Respiratory: Positive for cough and shortness of breath. Gastrointestinal: No abdominal pain.  No nausea, no vomiting.  No diarrhea.  No constipation. Genitourinary: Negative for dysuria. Musculoskeletal: Negative for back pain. Skin: Negative for rash. Neurological: Negative for headaches, focal weakness or numbness.   ____________________________________________   PHYSICAL EXAM:  VITAL SIGNS: ED Triage Vitals  Enc Vitals Group     BP 07/04/17 2239 (!) 234/167     Pulse Rate 07/04/17 2239 85     Resp 07/04/17 2239 20     Temp 07/04/17 2239 98.9 F (37.2 C)     Temp Source 07/04/17 2239 Oral     SpO2 07/04/17 2239 93 %     Weight 07/04/17 2241 254 lb (115.2 kg)     Height 07/04/17 2241 5\' 5"  (1.651 m)     Head Circumference --      Peak Flow --      Pain Score 07/04/17 2239 0     Pain Loc --      Pain Edu? --      Excl. in [place]? --     Constitutional: Alert and oriented.  Well appearing and in no acute distress. Eyes: Conjunctivae are normal. PERRL. EOMI. Head: Atraumatic. Nose: No congestion/rhinnorhea. Mouth/Throat: Mucous membranes are moist.  Oropharynx non-erythematous. Neck: No stridor.  No carotid bruits. Cardiovascular: Normal rate, regular rhythm. Grossly normal heart sounds.  Good peripheral circulation. Respiratory: Normal respiratory effort.  No retractions. Lungs with bibasilar rales.. Gastrointestinal: Obese. Soft and nontender. No distention. No abdominal bruits. No CVA tenderness. Musculoskeletal: No lower extremity  tenderness. 2+ nonpitting BLE edema.  No joint effusions. Neurologic:  Normal speech and language. No gross focal neurologic deficits are appreciated. No gait instability. Skin:  Skin is warm, dry and intact. No rash noted. Psychiatric: Mood and affect are normal. Speech and behavior are normal.  ____________________________________________   LABS (all labs ordered are listed, but only abnormal results are displayed)  Labs Reviewed  CULTURE, BLOOD (ROUTINE X 2)  CULTURE, BLOOD (ROUTINE X 2)  URINE CULTURE  CBC WITH DIFFERENTIAL/PLATELET  COMPREHENSIVE METABOLIC PANEL  TROPONIN I  BRAIN NATRIURETIC PEPTIDE  URINALYSIS, ROUTINE W REFLEX MICROSCOPIC  LACTIC ACID, PLASMA  LACTIC ACID, PLASMA   ____________________________________________  EKG  ED ECG REPORT I, Dung Salinger J, the attending physician, personally viewed and interpreted this ECG.   Date: 07/05/2017  EKG Time: 2303  Rate: 85  Rhythm: normal EKG, normal sinus rhythm  Axis: WNL  Intervals:right bundle branch block  ST&T Change: Nonspecific  ____________________________________________  RADIOLOGY  Dg Chest 2 View  Result Date: 07/04/2017 CLINICAL DATA:  Shortness of breath and chest tightness EXAM: CHEST  2 VIEW COMPARISON:  Chest radiograph 12/20/2016 FINDINGS: Cardiomegaly is unchanged. No pulmonary edema. No pleural effusion or pneumothorax. There is increased airspace opacity in the right lower lobe. IMPRESSION: 1. Increased airspace opacity in the right lower lobe may indicate developing consolidation, including the possibility of pneumonia. 2. Unchanged cardiomegaly. Electronically Signed   By: Ulyses Jarred M.D.   On: 07/04/2017 23:50    ____________________________________________   PROCEDURES  Procedure(s) performed: None  Procedures  Critical Care performed: Yes, see critical care note(s)   CRITICAL CARE Performed by: Paulette Blanch   Total critical care time: 30 minutes  Critical care time was  exclusive of separately billable procedures and treating other patients.  Critical care was necessary to treat or prevent imminent or life-threatening deterioration.  Critical care was time spent personally by me on the following activities: development of treatment plan with patient and/or surrogate as well as nursing, discussions with consultants, evaluation of patient's response to treatment, examination of patient, obtaining history from patient or surrogate, ordering and performing treatments and interventions, ordering and review of laboratory studies, ordering and review of radiographic studies, pulse oximetry and re-evaluation of patient's condition.  ____________________________________________   INITIAL IMPRESSION / ASSESSMENT AND PLAN / ED COURSE  Pertinent labs & imaging results that were available during my care of the patient were reviewed by me and considered in my medical decision making (see chart for details).  41 year old female who presents with cough, shortness of breath, chest pain; blood pressure extremely elevated. Pneumonia seen on x-ray. Room air saturations 91%. Will obtain blood cultures, initiate IV antibiotics, hydralazine for blood pressure control and discuss with hospitalist evaluate patient in the emergency department for admission.      ____________________________________________   FINAL CLINICAL IMPRESSION(S) / ED DIAGNOSES  Final diagnoses:  Acute on chronic congestive heart failure, unspecified heart failure type ([place])  Community acquired pneumonia, unspecified laterality  Hypoxia  Hypertensive urgency      NEW MEDICATIONS STARTED DURING  THIS VISIT:  New Prescriptions   No medications on file     Note:  This document was prepared using Dragon voice recognition software and may include unintentional dictation errors.    Paulette Blanch, MD 07/05/17 [phone]

## 2017-07-05 NOTE — Progress Notes (Signed)
Pharmacy Antibiotic Note  Soumya Colson is a 41 y.o. female admitted on 07/04/2017 with pneumonia.  Pharmacy has been consulted for azithromycin/ceftriaxone dosing.  Plan: Patient received azithromycin 500 mg IV and ceftriaxone 1g IV x 1 in ED  Will continue azithromycin 500 mg IV and ceftriaxone 1g IV daily.  Height: 5\' 5"  (165.1 cm) Weight: 254 lb (115.2 kg) IBW/kg (Calculated) : 57  Temp (24hrs), Avg:98.9 F (37.2 C), Min:98.9 F (37.2 C), Max:98.9 F (37.2 C)   Recent Labs Lab 07/05/17 0011  WBC 9.6  CREATININE 2.47*  LATICACIDVEN 0.8    Estimated Creatinine Clearance: 37.6 mL/min (A) (by C-G formula based on SCr of 2.47 mg/dL (H)).    No Known Allergies   Thank you for allowing pharmacy to be a part of this patient's care.  Tobie Lords, PharmD, BCPS Clinical Pharmacist 07/05/2017

## 2017-07-05 NOTE — Progress Notes (Signed)
Pt reports headache is easing off now.  SBP 149 currently.

## 2017-07-05 NOTE — H&P (Addendum)
History and Physical   [facility] [place] @ [facility] Admission History and Physical [employer], D.O.    Patient Name: Renee [name] MR#: [id number] Date of Birth: 08/12/1976 Date of Admission: 07/04/2017   Chief Complaint:  Chief Complaint  Patient presents with  . Shortness of Breath    HPI: Armie Moren is a 41 y.o. female with a known history of HTN presents to the emergency department for evaluation of Shortness of breath.  Patient was in a usual state of health until 6 days ago when she developed symptoms of upper respiratory infection including sinus congestion, rhinorrhea, headache and generalized fatigue. She has become progressively worse with symptoms consistent with pneumonia as she has had in the past. Now complains of shortness of breath associated with chest tightness worse with cough..  Patient denies fevers/chills, weakness, dizziness, chest pain, shortness of breath, N/V/C/D, abdominal pain, dysuria/frequency, changes in mental status.    Otherwise there has been no change in status. Patient has been taking medication as prescribed and there has been no recent change in medication or diet.  No recent antibiotics.  There has been no recent illness, hospitalizations, travel. 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     EMS/ED Course: Patient received Rocephin, azithromycin, hydralazine and nitroglycerin. Medical admission was requested for further management of community acquired pneumonia and hypertensive emergency.  Review of Systems:  CONSTITUTIONAL: Positive fever/chills, fatigue, weakness. Negative weight gain/loss, headache. EYES: No blurry or double vision. ENT: No tinnitus, postnasal drip, redness or soreness of the oropharynx. RESPIRATORY: Positive cough, dyspnea, wheeze.  No hemoptysis.  CARDIOVASCULAR: No chest pain, palpitations, syncope, orthopnea. No lower extremity edema.    GASTROINTESTINAL: No nausea, vomiting, abdominal pain, diarrhea, constipation.  No hematemesis, melena or hematochezia. GENITOURINARY: No dysuria, frequency, hematuria. ENDOCRINE: No polyuria or nocturia. No heat or cold intolerance. HEMATOLOGY: No anemia, bruising, bleeding. INTEGUMENTARY: No rashes, ulcers, lesions. MUSCULOSKELETAL: No arthritis, gout, dyspnea. NEUROLOGIC: No numbness, tingling, ataxia, seizure-type activity, weakness. PSYCHIATRIC: No anxiety, depression, insomnia.   Past Medical History:  Diagnosis Date  . Hypertension   Iron deficiency anemia   Past Surgical History:  Procedure Laterality Date  . CESAREAN SECTION    . TUBAL LIGATION       reports that she has never smoked. She has never used smokeless tobacco. She reports that she does not drink alcohol or use drugs.  No Known Allergies  Family History   Medical History Relation Name Comments  Coronary Artery Disease (Blocked arteries around heart) Father    Diabetes type II Father    Hyperlipidemia (Elevated cholesterol) Father    High blood pressure (Hypertension) Mother       Prior to Admission medications   Medication Sig Start Date End Date Taking? Authorizing Provider  cloNIDine (CATAPRES) 0.2 MG tablet Take 1 tablet (0.2 mg total) by mouth 2 (two) times daily. 06/17/17   Bettey Costa, MD  hydrALAZINE (APRESOLINE) 100 MG tablet Take 1 tablet (100 mg total) by mouth 2 (two) times daily. 06/17/17   Bettey Costa, MD  labetalol (NORMODYNE) 200 MG tablet Take 1 tablet (200 mg total) by mouth 2 (two) times daily. 06/17/17   Bettey Costa, MD    Physical Exam: Vitals:   07/04/17 2239 07/04/17 2241 07/04/17 2257  BP: (!) 234/167  (!) 259/141  Pulse: 85  87  Resp: 20  19  Temp: 98.9 F (37.2 C)  TempSrc: Oral    SpO2: 93%  94%  Weight:  115.2 kg (254 lb)   Height:  5\' 5"  (1.651 m)     GENERAL: 41 y.o.-year-old female patient, well-developed, well-nourished lying in the bed in no acute  distress.  Pleasant and cooperative.   HEENT: Head atraumatic, normocephalic. Pupils equal, round, reactive to light and accommodation. No scleral icterus. Extraocular muscles intact. Nares are patent. Oropharynx is clear. Mucus membranes moist. NECK: Supple, full range of motion. No JVD, no bruit heard. No thyroid enlargement, no tenderness, no cervical lymphadenopathy. CHEST: Normal breath sounds bilaterally. No wheezing, rales, rhonchi or crackles. No use of accessory muscles of respiration.  No reproducible chest wall tenderness.  CARDIOVASCULAR: S1, S2 normal. No murmurs, rubs, or gallops. Cap refill <2 seconds. Pulses intact distally.  ABDOMEN: Soft, nondistended, nontender. No rebound, guarding, rigidity. Normoactive bowel sounds present in all four quadrants. No organomegaly or mass. EXTREMITIES: No pedal edema, cyanosis, or clubbing. No calf tenderness or Homan's sign.  NEUROLOGIC: The patient is alert and oriented x 3. Cranial nerves II through XII are grossly intact with no focal sensorimotor deficit. Muscle strength 5/5 in all extremities. Sensation intact. Gait not checked. PSYCHIATRIC:  Normal affect, mood, thought content. SKIN: Warm, dry, and intact without obvious rash, lesion, or ulcer.    Labs on Admission:  CBC:  Recent Labs Lab 07/05/17 0011  WBC 9.6  NEUTROABS 7.3*  HGB 11.8*  HCT 37.0  MCV 73.8*  PLT 203   Basic Metabolic Panel: No results for input(s): NA, K, CL, CO2, GLUCOSE, BUN, CREATININE, CALCIUM, MG, PHOS in the last 168 hours. GFR: Estimated Creatinine Clearance: 28.9 mL/min (A) (by C-G formula based on SCr of 3.22 mg/dL (H)). Liver Function Tests: No results for input(s): AST, ALT, ALKPHOS, BILITOT, PROT, ALBUMIN in the last 168 hours. No results for input(s): LIPASE, AMYLASE in the last 168 hours. No results for input(s): AMMONIA in the last 168 hours. Coagulation Profile: No results for input(s): INR, PROTIME in the last 168 hours. Cardiac  Enzymes: No results for input(s): CKTOTAL, CKMB, CKMBINDEX, TROPONINI in the last 168 hours. BNP (last 3 results) No results for input(s): PROBNP in the last 8760 hours. HbA1C: No results for input(s): HGBA1C in the last 72 hours. CBG: No results for input(s): GLUCAP in the last 168 hours. Lipid Profile: No results for input(s): CHOL, HDL, LDLCALC, TRIG, CHOLHDL, LDLDIRECT in the last 72 hours. Thyroid Function Tests: No results for input(s): TSH, T4TOTAL, FREET4, T3FREE, THYROIDAB in the last 72 hours. Anemia Panel: No results for input(s): VITAMINB12, FOLATE, FERRITIN, TIBC, IRON, RETICCTPCT in the last 72 hours. Urine analysis:    Component Value Date/Time   COLORURINE YELLOW (A) 12/21/2016 0430   APPEARANCEUR HAZY (A) 12/21/2016 0430   LABSPEC 1.012 12/21/2016 0430   PHURINE 6.0 12/21/2016 0430   GLUCOSEU 50 (A) 12/21/2016 0430   HGBUR MODERATE (A) 12/21/2016 0430   BILIRUBINUR NEGATIVE 12/21/2016 0430   KETONESUR NEGATIVE 12/21/2016 0430   PROTEINUR 100 (A) 12/21/2016 0430   NITRITE NEGATIVE 12/21/2016 0430   LEUKOCYTESUR TRACE (A) 12/21/2016 0430   Sepsis Labs: @LABRCNTIP (procalcitonin:4,lacticidven:4) )No results found for this or any previous visit (from the past 240 hour(s)).   Radiological Exams on Admission: Dg Chest 2 View  Result Date: 07/04/2017 CLINICAL DATA:  Shortness of breath and chest tightness EXAM: CHEST  2 VIEW COMPARISON:  Chest radiograph 12/20/2016 FINDINGS: Cardiomegaly is unchanged. No pulmonary edema. No pleural effusion or pneumothorax. There is increased airspace opacity  in the right lower lobe. IMPRESSION: 1. Increased airspace opacity in the right lower lobe may indicate developing consolidation, including the possibility of pneumonia. 2. Unchanged cardiomegaly. Electronically Signed   By: Ulyses Jarred M.D.   On: 07/04/2017 23:50    EKG: Normal sinus rhythm at 85 bpm with normal axis, IRBB, LVH and nonspecific ST-T wave changes.    Assessment/Plan  This is a 41 y.o. female with a history of HTN, iron deficiency anemia now being admitted with:   #. Hypertensive emergency with chest pain - Admit to inpatient, telemetry monitoring. - Trend troponins, check lipids and TSH. - BP control. Continue clonidine, hydralazine and labetalol - Start nitro drip if BP is refractory  #. Community Acquired Pneumonia - IV Rocephin & Azithromycin per pharmacy - IV fluid hydration - Duonebs, expectorants & O2 therapy as needed - Follow up blood & sputum cultures  Admission status: Inpatient, telemetry IV Fluids: Normal saline Diet/Nutrition: Heart healthy Consults called: None  DVT Px: SCDs and early ambulation. Code Status: Full Code  Disposition Plan: To home in 1-2 days  All the records are reviewed and case discussed with ED provider. Management plans discussed with the patient and/or family who express understanding and agree with plan of care.  Alexis Hugelmeyer D.O. on 07/05/2017 at 12:59 AM Between 7am to 6pm - Pager - [phone] After 6pm go to [email or web address] - [employer] [facility] [facility] Office [phone] CC: Primary care physician; [facility]   07/05/2017, 12:59 AM     Patients blood pressure is still elevated with systolic blood pressure greater than 215 mm of HG Will triage patient to step down unit and start patient on IV nitroglycerin drip.

## 2017-07-05 NOTE — ED Notes (Signed)
Date and time results received: 07/05/17 0133 (use smartphrase ".now" to insert current time)  Test: troponin Critical Value: 0.04  Name of Provider Notified: Hugelmeyer  Orders Received? Or Actions Taken?:

## 2017-07-06 ENCOUNTER — Encounter: Payer: Self-pay | Admitting: Pulmonary Disease

## 2017-07-06 LAB — BASIC METABOLIC PANEL
ANION GAP: 8 (ref 5–15)
BUN: 31 mg/dL — ABNORMAL HIGH (ref 6–20)
CHLORIDE: 106 mmol/L (ref 101–111)
CO2: 24 mmol/L (ref 22–32)
Calcium: 8.8 mg/dL — ABNORMAL LOW (ref 8.9–10.3)
Creatinine, Ser: 3.1 mg/dL — ABNORMAL HIGH (ref 0.44–1.00)
GFR calc non Af Amer: 17 mL/min — ABNORMAL LOW (ref >60)
GFR, EST AFRICAN AMERICAN: 20 mL/min — AB (ref >60)
Glucose, Bld: 160 mg/dL — ABNORMAL HIGH (ref 65–99)
Potassium: 3.7 mmol/L (ref 3.5–5.1)
SODIUM: 138 mmol/L (ref 135–145)

## 2017-07-06 LAB — PHOSPHORUS: PHOSPHORUS: 3.9 mg/dL (ref 2.5–4.6)

## 2017-07-06 LAB — TROPONIN I: TROPONIN I: 0.12 ng/mL — AB (ref <0.03)

## 2017-07-06 LAB — MAGNESIUM: MAGNESIUM: 2 mg/dL (ref 1.7–2.4)

## 2017-07-06 LAB — URINE CULTURE

## 2017-07-06 LAB — T4, FREE: Free T4: 1.08 ng/dL (ref 0.61–1.12)

## 2017-07-06 MED ORDER — LOSARTAN POTASSIUM 50 MG PO TABS
100.0000 mg | ORAL_TABLET | Freq: Every day | ORAL | Status: DC
  Administered 2017-07-06 – 2017-07-09 (×4): 100 mg via ORAL
  Filled 2017-07-06 (×5): qty 2

## 2017-07-06 MED ORDER — LABETALOL HCL 100 MG PO TABS
300.0000 mg | ORAL_TABLET | Freq: Two times a day (BID) | ORAL | Status: DC
  Administered 2017-07-06: 300 mg via ORAL
  Filled 2017-07-06: qty 1
  Filled 2017-07-06: qty 3

## 2017-07-06 MED ORDER — HYDRALAZINE HCL 20 MG/ML IJ SOLN
10.0000 mg | Freq: Three times a day (TID) | INTRAMUSCULAR | Status: DC | PRN
  Administered 2017-07-06 – 2017-07-08 (×4): 10 mg via INTRAVENOUS
  Filled 2017-07-06 (×5): qty 1

## 2017-07-06 MED ORDER — CARVEDILOL 12.5 MG PO TABS
12.5000 mg | ORAL_TABLET | Freq: Two times a day (BID) | ORAL | Status: DC
  Administered 2017-07-06 – 2017-07-07 (×3): 12.5 mg via ORAL
  Filled 2017-07-06 (×3): qty 1

## 2017-07-06 NOTE — Progress Notes (Signed)
[facility]  ROUNDING NOTE   Subjective:   [name] admitted to [facility] on 07/04/2017 for Hypoxia [R09.02] Hypertensive urgency [I16.0] Community acquired pneumonia, unspecified laterality [J18.9] Acute on chronic congestive heart failure, unspecified heart failure type ([place]) [I50.9]   Placed on nicardipine gtt for hypertensive emergency. Now weaned off.    Objective:  Vital signs in last 24 hours:  Temp:  [97.8 F (36.6 C)-98.7 F (37.1 C)] 97.8 F (36.6 C) (07/07 0800) Pulse Rate:  [70-111] 72 (07/07 1100) Resp:  [8-44] 27 (07/07 1100) BP: (99-189)/(43-103) 149/78 (07/07 1100) SpO2:  [90 %-100 %] 92 % (07/07 1100)  Weight change:  Filed Weights   07/04/17 2241  Weight: 115.2 kg (254 lb)    Intake/Output: I/O last 3 completed shifts: In: 2411 [P.O.:510; I.V.:1651; IV Piggyback:250] Out: 950 [Urine:950]   Intake/Output this shift:  No intake/output data recorded.  Physical Exam: General: NAD, sitting in bed  Head: Normocephalic, atraumatic. Moist oral mucosal membranes  Eyes: Anicteric, PERRL  Neck: Supple, trachea midline  Lungs:  Clear to auscultation  Heart: Regular rate and rhythm  Abdomen:  Soft, nontender,   Extremities: no peripheral edema.  Neurologic: Nonfocal, moving all four extremities  Skin: No lesions       Basic Metabolic Panel:  Recent Labs Lab 07/05/17 0011 07/05/17 0622 07/06/17 0409  NA 139 138 138  K 3.4* 3.5 3.7  CL 107 106 106  CO2 23 24 24   GLUCOSE 131* 178* 160*  BUN 31* 30* 31*  CREATININE 2.47* 2.51* 3.10*  CALCIUM 9.3 9.1 8.8*  MG  --  2.0 2.0  PHOS  --  3.5 3.9    Liver Function Tests:  Recent Labs Lab 07/05/17 0011  AST 22  ALT 24  ALKPHOS 77  BILITOT 0.8  PROT 7.0  ALBUMIN 3.5   No results for input(s): LIPASE, AMYLASE in the last 168 hours. No results for input(s): AMMONIA in the last 168 hours.  CBC:  Recent Labs Lab 07/05/17 0011 07/05/17 0622  WBC 9.6 10.2  NEUTROABS 7.3*   --   HGB 11.8* 11.5*  HCT 37.0 36.2  MCV 73.8* 73.5*  PLT 385 389    Cardiac Enzymes:  Recent Labs Lab 07/05/17 0011 07/05/17 0622 07/05/17 1429 07/05/17 1940 07/06/17 0409  TROPONINI 0.04* 0.03* 0.03* 0.06* 0.12*    BNP: Invalid input(s): POCBNP  CBG:  Recent Labs Lab 07/05/17 0556  GLUCAP 176*    Microbiology: Results for orders placed or performed during the hospital encounter of 07/04/17  Blood Culture (routine x 2)     Status: None (Preliminary result)   Collection Time: 07/05/17 12:11 AM  Result Value Ref Range Status   Specimen Description BLOOD RAC  Final   Special Requests   Final    BOTTLES DRAWN AEROBIC AND ANAEROBIC Blood Culture adequate volume   Culture NO GROWTH 1 DAY  Final   Report Status PENDING  Incomplete  Blood Culture (routine x 2)     Status: None (Preliminary result)   Collection Time: 07/05/17 12:11 AM  Result Value Ref Range Status   Specimen Description BLOOD RAC  Final   Special Requests   Final    BOTTLES DRAWN AEROBIC AND ANAEROBIC Blood Culture adequate volume   Culture NO GROWTH 1 DAY  Final   Report Status PENDING  Incomplete  Urine culture     Status: Abnormal   Collection Time: 07/05/17 12:11 AM  Result Value Ref Range Status   Specimen  Description URINE, RANDOM  Final   Special Requests NONE  Final   Culture MULTIPLE ORGANISMS PRESENT, NONE PREDOMINANT (A)  Final   Report Status 07/06/2017 FINAL  Final  MRSA PCR Screening     Status: None   Collection Time: 07/05/17  5:56 AM  Result Value Ref Range Status   MRSA by PCR NEGATIVE NEGATIVE Final    Comment:        The GeneXpert MRSA Assay (FDA approved for NASAL specimens only), is one component of a comprehensive MRSA colonization surveillance program. It is not intended to diagnose MRSA infection nor to guide or monitor treatment for MRSA infections.     Coagulation Studies: No results for input(s): LABPROT, INR in the last 72 hours.  Urinalysis:  Recent  Labs  07/05/17 0011  COLORURINE STRAW*  LABSPEC 1.008  PHURINE 6.0  GLUCOSEU NEGATIVE  HGBUR SMALL*  BILIRUBINUR NEGATIVE  KETONESUR NEGATIVE  PROTEINUR 100*  NITRITE NEGATIVE  LEUKOCYTESUR NEGATIVE      Imaging: Dg Chest 2 View  Result Date: 07/04/2017 CLINICAL DATA:  Shortness of breath and chest tightness EXAM: CHEST  2 VIEW COMPARISON:  Chest radiograph 12/20/2016 FINDINGS: Cardiomegaly is unchanged. No pulmonary edema. No pleural effusion or pneumothorax. There is increased airspace opacity in the right lower lobe. IMPRESSION: 1. Increased airspace opacity in the right lower lobe may indicate developing consolidation, including the possibility of pneumonia. 2. Unchanged cardiomegaly. Electronically Signed   By: Ulyses Jarred M.D.   On: 07/04/2017 23:50   US Renal  Result Date: 07/05/2017 CLINICAL DATA:  Renal failure EXAM: RENAL / URINARY TRACT ULTRASOUND COMPLETE COMPARISON:  June 15, 2017 FINDINGS: Right Kidney: Length: 10.6 cm. There is increased echotexture of the kidney. No mass or hydronephrosis visualized. Left Kidney: Length: 10.1 cm. There is increased echotexture of the kidney. No mass or hydronephrosis visualized. Bladder: Appears normal for degree of bladder distention. IMPRESSION: Increased echotexture of bilateral kidneys, nonspecific but can be seen in medical renal disease. Electronically Signed   By: Abelardo Diesel M.D.   On: 07/05/2017 09:32     Medications:   . cefTRIAXone (ROCEPHIN) IVPB 1 gram/50 mL D5W Stopped (07/06/17 0020)   . azithromycin  250 mg Oral Daily  . cloNIDine  0.2 mg Oral BID  . guaiFENesin  600 mg Oral BID   And  . dextromethorphan  30 mg Oral BID  . hydrALAZINE  100 mg Oral BID  . labetalol  300 mg Oral BID  . sodium chloride flush  3 mL Intravenous Q12H   acetaminophen **OR** acetaminophen, albuterol, benzonatate, bisacodyl, hydrALAZINE, ipratropium, magnesium citrate, ondansetron **OR** ondansetron (ZOFRAN) IV, oxyCODONE,  senna-docusate  Assessment/ Plan:  [name] is a 41 y.o. black female with hypertension, diabetes mellitus type II, morbid obesity, anemia, who was admitted to [facility] on 07/04/2017  1. Acute renal failure on chronic kidney disease stage IV with proteinuria Creatinine 2.44, GFR 27 on 11/2016.  Acute renal failure from hypertensive urgency Suspect underlying chronic kidney disease secondary to hypertension and diabetes - Discussed chronic kidney disease and renal failure with patient and family  2. Hypertension with diastolic congestive heart failure:  Home regimen of clonidine, furosemide, hydralazine, labetalol and lisinopril. However lisinopril is giving patient a cough.  - Discontinue lisinopril and start losartan 100mg  daily. Monitor renal function and potassium - Discontinue labetalol and start carvedilol 12.5mg  bid - Restarted clonidine and hydralazine - Will consider starting amlodipine if further control required.  - Currently holding furosemide  3. Diabetes mellitus type II with chronic kidney disease: hemoglobin A1c 7.1% on 6/16.  - continue glucose control   4. Pneumonia: afebrile. No leukocytosis - continue antibiotics and supportive care   LOS: [place], [place] 7/7/201811:21 AM

## 2017-07-06 NOTE — Progress Notes (Signed)
CRITICAL VALUE ALERT  Critical Value:  troponin  Date & Time Notied:  07/05/2017@10 :00 pm  Provider Notified: Jeoffrey Massed  Orders Received/Actions taken: No new orders.

## 2017-07-06 NOTE — Progress Notes (Signed)
Patient arrived on unit via wheelhair. Pt is AAOx4. Patient oriented to the room, IV intact. Denies pain and needs at this time.

## 2017-07-06 NOTE — Progress Notes (Signed)
Patient put on 2L oxygen d/t O2 sats decreasing when patient resting/asleep.  Will monitor.

## 2017-07-06 NOTE — Progress Notes (Signed)
Patient on room air at this time.  O2 sat above 95% room air.  Will monitor.

## 2017-07-06 NOTE — Progress Notes (Signed)
[place] at [place] NAME: Renee [name]    MR#:  [id number]  DATE OF BIRTH:  [birth year]  SUBJECTIVE:   Came in with increasing shortness of breath cough and chills found to have elevated blood pressure and right lower lobe pneumonia   Patient feels a lot better today breathing and improved She is off the Nicardipine drip REVIEW OF SYSTEMS:   Review of Systems  Constitutional: Positive for chills. Negative for fever and weight loss.  HENT: Negative for ear discharge, ear pain and nosebleeds.   Eyes: Negative for blurred vision, pain and discharge.  Respiratory: Positive for shortness of breath. Negative for sputum production, wheezing and stridor.   Cardiovascular: Negative for chest pain, palpitations, orthopnea and PND.  Gastrointestinal: Negative for abdominal pain, diarrhea, nausea and vomiting.  Genitourinary: Negative for frequency and urgency.  Musculoskeletal: Negative for back pain and joint pain.  Neurological: Positive for weakness. Negative for sensory change, speech change and focal weakness.  Psychiatric/Behavioral: Negative for depression and hallucinations. The patient is not nervous/anxious.    Tolerating Diet:Yes Tolerating PT: Not needed. Patient ambulatory  DRUG ALLERGIES:  No Known Allergies  VITALS:  Blood pressure (!) 149/78, pulse 72, temperature 97.8 F (36.6 C), temperature source Axillary, resp. rate (!) 27, height 5\' 5"  (1.651 m), weight 115.2 kg (254 lb), last menstrual period 05/27/2017, SpO2 92 %.  PHYSICAL EXAMINATION:   Physical Exam  GENERAL:  41 y.o.-year-old patient lying in the bed with no acute distress. Obese  EYES: Pupils equal, round, reactive to light and accommodation. No scleral icterus. Extraocular muscles intact.  HEENT: Head atraumatic, normocephalic. Oropharynx and nasopharynx clear.  NECK:  Supple, no jugular venous distention. No thyroid enlargement, no tenderness.  LUNGS:  Normal breath sounds bilaterally, no wheezing, rales, rhonchi. No use of accessory muscles of respiration.  CARDIOVASCULAR: S1, S2 normal. No murmurs, rubs, or gallops.  ABDOMEN: Soft, nontender, nondistended. Bowel sounds present. No organomegaly or mass.  EXTREMITIES: No cyanosis, clubbing or edema b/l.    NEUROLOGIC: Cranial nerves II through XII are intact. No focal Motor or sensory deficits b/l.   PSYCHIATRIC:  patient is alert and oriented x 3.  SKIN: No obvious rash, lesion, or ulcer.   LABORATORY PANEL:  CBC  Recent Labs Lab 07/05/17 0622  WBC 10.2  HGB 11.5*  HCT 36.2  PLT 389    Chemistries   Recent Labs Lab 07/05/17 0011  07/06/17 0409  NA 139  < > 138  K 3.4*  < > 3.7  CL 107  < > 106  CO2 23  < > 24  GLUCOSE 131*  < > 160*  BUN 31*  < > 31*  CREATININE 2.47*  < > 3.10*  CALCIUM 9.3  < > 8.8*  MG  --   < > 2.0  AST 22  --   --   ALT 24  --   --   ALKPHOS 77  --   --   BILITOT 0.8  --   --   < > = values in this interval not displayed. Cardiac Enzymes  Recent Labs Lab 07/06/17 0409  TROPONINI 0.12*   RADIOLOGY:  Dg Chest 2 View  Result Date: 07/04/2017 CLINICAL DATA:  Shortness of breath and chest tightness EXAM: CHEST  2 VIEW COMPARISON:  Chest radiograph 12/20/2016 FINDINGS: Cardiomegaly is unchanged. No pulmonary edema. No pleural effusion or pneumothorax. There is increased airspace opacity in the right lower lobe. IMPRESSION:  1. Increased airspace opacity in the right lower lobe may indicate developing consolidation, including the possibility of pneumonia. 2. Unchanged cardiomegaly. Electronically Signed   By: Ulyses Jarred M.D.   On: 07/04/2017 23:50   US Renal  Result Date: 07/05/2017 CLINICAL DATA:  Renal failure EXAM: RENAL / URINARY TRACT ULTRASOUND COMPLETE COMPARISON:  June 15, 2017 FINDINGS: Right Kidney: Length: 10.6 cm. There is increased echotexture of the kidney. No mass or hydronephrosis visualized. Left Kidney: Length: 10.1 cm. There  is increased echotexture of the kidney. No mass or hydronephrosis visualized. Bladder: Appears normal for degree of bladder distention. IMPRESSION: Increased echotexture of bilateral kidneys, nonspecific but can be seen in medical renal disease. Electronically Signed   By: Abelardo Diesel M.D.   On: 07/05/2017 09:32   ASSESSMENT AND PLAN:  Renee [name] is a 41 y.o. female with a known history of HTN presents to the emergency department for evaluation of Shortness of breath.  Patient was in a usual state of health until 6 days ago when she developed symptoms of upper respiratory infection including sinus congestion, rhinorrhea, headache and generalized fatigue.    #. Hypertensive emergency with chest pain - BP control. Continue clonidine, hydralazine and labetalol -Patient is Weaned off IV nicardipine drip. -Echo done in June 2018 showed LVH with moderate concentric hypertrophy and EF 50-55% -2 g sodium diet recommended -Continue labetalol, clonidine, hydralazine  #. Community Acquired Pneumonia right lower lobe - IV Rocephin & Azithromycin per pharmacy - Duonebs, expectorants & O2 therapy as needed - Blood culture negative  # Acute on chronic kidney disease stage 3/4 secondary to severe hypertension -Patient recommended to follow-up patient nephrology. He she has appointment with Dr. Holley Raring. -Avoid nephrotoxins  #Morbid obesity -Advised lifestyle changes with exercise and diet control -Dietitian to see  #Management for discharge planning -Patient needs a primary care physician.  Transfer to 2 a  Case discussed with Care Management/Social Worker. Management plans discussed with the patient, family and they are in agreement.  CODE STATUS: Full  DVT Prophylaxis: Heparin  TOTAL TIME TAKING CARE OF THIS PATIENT:30  minutes.  >50% time spent on counselling and coordination of carePatient and family  POSSIBLE D/C IN 2-3  DAYS, DEPENDING ON CLINICAL CONDITION.  Note: This  dictation was prepared with Dragon dictation along with smaller phrase technology. Any transcriptional errors that result from this process are unintentional.  Annalycia Done M.D on 07/06/2017 at 11:34 AM  Between 7am to 6pm - Pager - [phone]  After 6pm go to [email or web address] - password [email or web address] [place] [facility]  Office  [phone]  CC: Primary care physician; [facility]

## 2017-07-06 NOTE — Progress Notes (Signed)
[facility] Progess Note    SYNOPSIS   41 yo female with a known history of hypertension. Patient presents to ED on 7/6 with shortness of breath. Patient was apparently well until 6 days ago when she developed some respiratory infection including sinus congestion,rhinorrhea,headache and generalized fatigue. Her symptoms progressively worsened therefore decided to come to hospital. Patient was noted to have elevated BP,SBP Iin 259 . Patient was started on Nitroglycerin drip and was sent to the ICU for closer monitoring  ASSESSMENT/PLAN   Hypertensive emergency. Weaning off of Cardene infusion, presently on hydralazine, clonidine and labetalol. Will maximize dosage, considering an additional calcium channel blocker, will attempt to wean off a Cardizem infusion.   Positive troponin. Peak troponin at 0.12. Most likely reflects strain from hypertensive emergency. Echocardiogram showed preserved ejection fraction at 60-65%. Mild atrial dilation with LVH in the moderate range  Worsening renal function. Secondary to chronic hypertensive renal disease. Often has blood pressure is improved BUN/creatinine will worsen secondary normalizing a dry pressure. Will follow closely  Pneumonia. Patient presently on azithromycin and Rocephin, will complete a 5 day course of azithromycin and seven-day course of Rocephin. We'll need follow-up imaging until resolution of infiltrate   INTAKE / OUTPUT:  Intake/Output Summary (Last 24 hours) at 07/06/17 0801 Last data filed at 07/06/17 0600  Gross per 24 hour  Intake          2210.99 ml  Output              600 ml  Net          1610.99 ml   INameMikesha [name] MRN: [id number] DOB: [birth year]    ADMISSION DATE:  07/04/2017   SUBJECTIVE:   Last 24 hours patient has been doing better, but switch from nitroglycerin drip Cardene secondary to severe headache. Presently on labetalol, clonidine, hydralazine with systolic blood pressure  of 150/70.  Review of Systems:  Constitutional: Feels well. Cardiovascular: No chest pain.  Pulmonary: Denies dyspnea.   10 point review of systems was negative  other than what is documented in the HPI.    VITAL SIGNS: Temp:  [98.5 F (36.9 C)-98.7 F (37.1 C)] 98.7 F (37.1 C) (07/07 0200) Pulse Rate:  [70-111] 79 (07/07 0700) Resp:  [8-44] 25 (07/07 0700) BP: (99-235)/(43-129) 155/76 (07/07 0700) SpO2:  [90 %-100 %] 95 % (07/07 0700)     PHYSICAL EXAMINATION: Physical Examination:   VS: BP (!) 155/76   Pulse 79   Temp 98.7 F (37.1 C) (Oral)   Resp (!) 25   Ht 5\' 5"  (1.651 m)   Wt 115.2 kg (254 lb)   LMP 05/27/2017   SpO2 95%   BMI 42.27 kg/m   General Appearance: No distress  Neuro:without focal findings, mental status normal. HEENT: PERRLA, EOM intact. Pulmonary: normal breath sounds   CardiovascularNormal S1,S2.  No m/r/g.   Abdomen: Benign, Soft, non-tender. Skin:   warm, no rashes, no ecchymosis  Extremities: normal, no cyanosis, clubbing.    LABORATORY PANEL:   CBC  Recent Labs Lab 07/05/17 0622  WBC 10.2  HGB 11.5*  HCT 36.2  PLT 389    Chemistries   Recent Labs Lab 07/05/17 0011  07/06/17 0409  NA 139  < > 138  K 3.4*  < > 3.7  CL 107  < > 106  CO2 23  < > 24  GLUCOSE 131*  < > 160*  BUN 31*  < > 31*  CREATININE 2.47*  < >  3.10*  CALCIUM 9.3  < > 8.8*  MG  --   < > 2.0  PHOS  --   < > 3.9  AST 22  --   --   ALT 24  --   --   ALKPHOS 77  --   --   BILITOT 0.8  --   --   < > = values in this interval not displayed.   Recent Labs Lab 07/05/17 0556  GLUCAP 176*   No results for input(s): PHART, PCO2ART, PO2ART in the last 168 hours.  Recent Labs Lab 07/05/17 0011  AST 22  ALT 24  ALKPHOS 77  BILITOT 0.8  ALBUMIN 3.5    Cardiac Enzymes  Recent Labs Lab 07/06/17 0409  TROPONINI 0.12*    RADIOLOGY:  Dg Chest 2 View  Result Date: 07/04/2017 CLINICAL DATA:  Shortness of breath and chest tightness EXAM:  CHEST  2 VIEW COMPARISON:  Chest radiograph 12/20/2016 FINDINGS: Cardiomegaly is unchanged. No pulmonary edema. No pleural effusion or pneumothorax. There is increased airspace opacity in the right lower lobe. IMPRESSION: 1. Increased airspace opacity in the right lower lobe may indicate developing consolidation, including the possibility of pneumonia. 2. Unchanged cardiomegaly. Electronically Signed   By: Ulyses Jarred M.D.   On: 07/04/2017 23:50   US Renal  Result Date: 07/05/2017 CLINICAL DATA:  Renal failure EXAM: RENAL / URINARY TRACT ULTRASOUND COMPLETE COMPARISON:  June 15, 2017 FINDINGS: Right Kidney: Length: 10.6 cm. There is increased echotexture of the kidney. No mass or hydronephrosis visualized. Left Kidney: Length: 10.1 cm. There is increased echotexture of the kidney. No mass or hydronephrosis visualized. Bladder: Appears normal for degree of bladder distention. IMPRESSION: Increased echotexture of bilateral kidneys, nonspecific but can be seen in medical renal disease. Electronically Signed   By: Abelardo Diesel M.D.   On: 07/05/2017 09:32    Hermelinda Dellen, DO ICU Pager: [phone] [facility] Office Number: [phone]   07/06/2017

## 2017-07-06 NOTE — Progress Notes (Signed)
Patient has been titrated down on Cardene. Will continue to monitor blood pressure.  Oxygen at 2L while asleep to maintain sats >92%.   Able to make needs known.  No concerns at time.

## 2017-07-07 LAB — RENAL FUNCTION PANEL
ANION GAP: 5 (ref 5–15)
Albumin: 3.1 g/dL — ABNORMAL LOW (ref 3.5–5.0)
BUN: 35 mg/dL — ABNORMAL HIGH (ref 6–20)
CHLORIDE: 109 mmol/L (ref 101–111)
CO2: 24 mmol/L (ref 22–32)
Calcium: 8.9 mg/dL (ref 8.9–10.3)
Creatinine, Ser: 2.86 mg/dL — ABNORMAL HIGH (ref 0.44–1.00)
GFR, EST AFRICAN AMERICAN: 22 mL/min — AB (ref >60)
GFR, EST NON AFRICAN AMERICAN: 19 mL/min — AB (ref >60)
Glucose, Bld: 135 mg/dL — ABNORMAL HIGH (ref 65–99)
PHOSPHORUS: 4.1 mg/dL (ref 2.5–4.6)
POTASSIUM: 3.5 mmol/L (ref 3.5–5.1)
Sodium: 138 mmol/L (ref 135–145)

## 2017-07-07 LAB — T3, FREE: T3 FREE: 2.1 pg/mL (ref 2.0–4.4)

## 2017-07-07 LAB — GLUCOSE, CAPILLARY
GLUCOSE-CAPILLARY: 109 mg/dL — AB (ref 65–99)
GLUCOSE-CAPILLARY: 100 mg/dL — AB (ref 65–99)
Glucose-Capillary: 126 mg/dL — ABNORMAL HIGH (ref 65–99)

## 2017-07-07 MED ORDER — FUROSEMIDE 40 MG PO TABS
40.0000 mg | ORAL_TABLET | Freq: Every day | ORAL | Status: DC
  Administered 2017-07-07 – 2017-07-08 (×2): 40 mg via ORAL
  Filled 2017-07-07 (×2): qty 1

## 2017-07-07 MED ORDER — INSULIN ASPART 100 UNIT/ML ~~LOC~~ SOLN
0.0000 [IU] | Freq: Three times a day (TID) | SUBCUTANEOUS | Status: DC
  Administered 2017-07-09: 2 [IU] via SUBCUTANEOUS
  Filled 2017-07-07: qty 1

## 2017-07-07 MED ORDER — LABETALOL HCL 5 MG/ML IV SOLN
5.0000 mg | Freq: Once | INTRAVENOUS | Status: AC
  Administered 2017-07-07: 5 mg via INTRAVENOUS
  Filled 2017-07-07: qty 4

## 2017-07-07 MED ORDER — CEFUROXIME AXETIL 500 MG PO TABS
500.0000 mg | ORAL_TABLET | Freq: Two times a day (BID) | ORAL | Status: DC
  Administered 2017-07-07: 500 mg via ORAL
  Filled 2017-07-07 (×2): qty 1

## 2017-07-07 NOTE — Plan of Care (Signed)
Problem: Health Behavior/Discharge Planning: Goal: Ability to manage health-related needs will improve Outcome: Progressing Patient given literature regarding hypertension and healthy eating habits to control blood pressure and monitor glucose levels. Patient verbalized the understanding.

## 2017-07-07 NOTE — Progress Notes (Signed)
[facility]  ROUNDING NOTE   Subjective:   Sister at bedside.  Moved from ICU  Blood pressure still elevated.  Continues to have productive cough  Complains of peripheral edema  Objective:  Vital signs in last 24 hours:  Temp:  [97.9 F (36.6 C)-98.2 F (36.8 C)] 98 F (36.7 C) (07/08 0815) Pulse Rate:  [71-77] 77 (07/08 0815) Resp:  [16-27] 16 (07/08 0344) BP: (149-198)/(74-98) 198/98 (07/08 0815) SpO2:  [92 %-96 %] 96 % (07/08 0815)  Weight change:  Filed Weights   07/04/17 2241  Weight: 115.2 kg (254 lb)    Intake/Output: I/O last 3 completed shifts: In: 1447.1 [P.O.:390; I.V.:1007.1; IV Piggyback:50] Out: 9381 [Urine:1075]   Intake/Output this shift:  Total I/O In: 240 [P.O.:240] Out: -   Physical Exam: General: NAD, sitting in bed  Head: Normocephalic, atraumatic. Moist oral mucosal membranes  Eyes: Anicteric, PERRL  Neck: Supple, trachea midline  Lungs:  Clear to auscultation  Heart: Regular rate and rhythm  Abdomen:  Soft, nontender, obese  Extremities: 1+ peripheral edema.  Neurologic: Nonfocal, moving all four extremities  Skin: No lesions       Basic Metabolic Panel:  Recent Labs Lab 07/05/17 0011 07/05/17 0622 07/06/17 0409 07/07/17 0328  NA 139 138 138 138  K 3.4* 3.5 3.7 3.5  CL 107 106 106 109  CO2 23 24 24 24   GLUCOSE 131* 178* 160* 135*  BUN 31* 30* 31* 35*  CREATININE 2.47* 2.51* 3.10* 2.86*  CALCIUM 9.3 9.1 8.8* 8.9  MG  --  2.0 2.0  --   PHOS  --  3.5 3.9 4.1    Liver Function Tests:  Recent Labs Lab 07/05/17 0011 07/07/17 0328  AST 22  --   ALT 24  --   ALKPHOS 77  --   BILITOT 0.8  --   PROT 7.0  --   ALBUMIN 3.5 3.1*   No results for input(s): LIPASE, AMYLASE in the last 168 hours. No results for input(s): AMMONIA in the last 168 hours.  CBC:  Recent Labs Lab 07/05/17 0011 07/05/17 0622  WBC 9.6 10.2  NEUTROABS 7.3*  --   HGB 11.8* 11.5*  HCT 37.0 36.2  MCV 73.8* 73.5*  PLT 385 389     Cardiac Enzymes:  Recent Labs Lab 07/05/17 0011 07/05/17 0622 07/05/17 1429 07/05/17 1940 07/06/17 0409  TROPONINI 0.04* 0.03* 0.03* 0.06* 0.12*    BNP: Invalid input(s): POCBNP  CBG:  Recent Labs Lab 07/05/17 0556  GLUCAP 176*    Microbiology: Results for orders placed or performed during the hospital encounter of 07/04/17  Blood Culture (routine x 2)     Status: None (Preliminary result)   Collection Time: 07/05/17 12:11 AM  Result Value Ref Range Status   Specimen Description BLOOD RAC  Final   Special Requests   Final    BOTTLES DRAWN AEROBIC AND ANAEROBIC Blood Culture adequate volume   Culture NO GROWTH 2 DAYS  Final   Report Status PENDING  Incomplete  Blood Culture (routine x 2)     Status: None (Preliminary result)   Collection Time: 07/05/17 12:11 AM  Result Value Ref Range Status   Specimen Description BLOOD RAC  Final   Special Requests   Final    BOTTLES DRAWN AEROBIC AND ANAEROBIC Blood Culture adequate volume   Culture NO GROWTH 2 DAYS  Final   Report Status PENDING  Incomplete  Urine culture     Status: Abnormal   Collection  Time: 07/05/17 12:11 AM  Result Value Ref Range Status   Specimen Description URINE, RANDOM  Final   Special Requests NONE  Final   Culture MULTIPLE ORGANISMS PRESENT, NONE PREDOMINANT (A)  Final   Report Status 07/06/2017 FINAL  Final  MRSA PCR Screening     Status: None   Collection Time: 07/05/17  5:56 AM  Result Value Ref Range Status   MRSA by PCR NEGATIVE NEGATIVE Final    Comment:        The GeneXpert MRSA Assay (FDA approved for NASAL specimens only), is one component of a comprehensive MRSA colonization surveillance program. It is not intended to diagnose MRSA infection nor to guide or monitor treatment for MRSA infections.     Coagulation Studies: No results for input(s): LABPROT, INR in the last 72 hours.  Urinalysis:  Recent Labs  07/05/17 0011  COLORURINE STRAW*  LABSPEC 1.008  PHURINE  6.0  GLUCOSEU NEGATIVE  HGBUR SMALL*  BILIRUBINUR NEGATIVE  KETONESUR NEGATIVE  PROTEINUR 100*  NITRITE NEGATIVE  LEUKOCYTESUR NEGATIVE      Imaging: No results found.   Medications:    . azithromycin  250 mg Oral Daily  . carvedilol  12.5 mg Oral BID WC  . cefUROXime  500 mg Oral BID WC  . cloNIDine  0.2 mg Oral BID  . guaiFENesin  600 mg Oral BID   And  . dextromethorphan  30 mg Oral BID  . furosemide  40 mg Oral Daily  . hydrALAZINE  100 mg Oral BID  . insulin aspart  0-9 Units Subcutaneous TID WC  . losartan  100 mg Oral Daily  . sodium chloride flush  3 mL Intravenous Q12H   acetaminophen **OR** acetaminophen, albuterol, benzonatate, bisacodyl, hydrALAZINE, ipratropium, magnesium citrate, ondansetron **OR** ondansetron (ZOFRAN) IV, oxyCODONE, senna-docusate  Assessment/ Plan:  [name] is a 41 y.o. black female with hypertension, diabetes mellitus type II, morbid obesity, anemia, who was admitted to [facility] on 07/04/2017 for pneumonia and hypertensive emergency. Placed on nicardipine gtt and admitted to ICU.   1. Acute renal failure on chronic kidney disease stage IV with proteinuria Creatinine 2.44, GFR 27 on 11/2016.  Acute renal failure from hypertensive urgency Suspect underlying chronic kidney disease secondary to hypertension and diabetes - Discussed chronic kidney disease and renal failure with patient and family. Literature provided.  - Started on losartan on this admission.   2. Hypertension with diastolic congestive heart failure:  Home regimen of clonidine, furosemide, hydralazine, labetalol and lisinopril. However lisinopril is giving patient a cough.  - Discontinued lisinopril and started losartan 100mg  daily. Monitor renal function and potassium - Discontinued labetalol and started carvedilol 12.5mg  bid - Restarted clonidine and hydralazine which patient was not taking.  - Start furosemide 40mg  daily PO daily.   3. Diabetes mellitus type  II with chronic kidney disease: hemoglobin A1c 7.1% on 6/16.  - continue glucose control   4. Pneumonia: afebrile. No leukocytosis - continue antibiotics and supportive care   LOS: [place], [place] 7/8/201810:29 AM

## 2017-07-07 NOTE — Progress Notes (Signed)
Notified Dr. Ara Kussmaul of BP of 202/118 and then 197/81 after IV hydralazine.  PO delsym d/c'd and one time dose of IV labetalol ordered.

## 2017-07-07 NOTE — Progress Notes (Signed)
[place] [facility] [place] NAME: Renee [name]    MR#:  [id number]  DATE OF BIRTH:  1976/08/04  SUBJECTIVE:   Came in with increasing shortness of breath cough and chills found to have elevated blood pressure and right lower lobe pneumonia   Patient feels a lot better today breathing and improved REVIEW OF SYSTEMS:   Review of Systems  Constitutional: Positive for chills. Negative for fever and weight loss.  HENT: Negative for ear discharge, ear pain and nosebleeds.   Eyes: Negative for blurred vision, pain and discharge.  Respiratory: Positive for shortness of breath. Negative for sputum production, wheezing and stridor.   Cardiovascular: Negative for chest pain, palpitations, orthopnea and PND.  Gastrointestinal: Negative for abdominal pain, diarrhea, nausea and vomiting.  Genitourinary: Negative for frequency and urgency.  Musculoskeletal: Negative for back pain and joint pain.  Neurological: Positive for weakness. Negative for sensory change, speech change and focal weakness.  Psychiatric/Behavioral: Negative for depression and hallucinations. The patient is not nervous/anxious.    Tolerating Diet:Yes Tolerating PT: Not needed. Patient ambulatory  DRUG ALLERGIES:  No Known Allergies  VITALS:  Blood pressure (!) 168/82, pulse 67, temperature 98 F (36.7 C), temperature source Oral, resp. rate 16, height 5\' 5"  (1.651 m), weight 115.2 kg (254 lb), last menstrual period 05/27/2017, SpO2 96 %.  PHYSICAL EXAMINATION:   Physical Exam  GENERAL:  41 y.o.-year-old patient lying in the bed with no acute distress. Obese  EYES: Pupils equal, round, reactive to light and accommodation. No scleral icterus. Extraocular muscles intact.  HEENT: Head atraumatic, normocephalic. Oropharynx and nasopharynx clear.  NECK:  Supple, no jugular venous distention. No thyroid enlargement, no tenderness.  LUNGS: Normal breath sounds bilaterally, no  wheezing, rales, rhonchi. No use of accessory muscles of respiration.  CARDIOVASCULAR: S1, S2 normal. No murmurs, rubs, or gallops.  ABDOMEN: Soft, nontender, nondistended. Bowel sounds present. No organomegaly or mass.  EXTREMITIES: No cyanosis, clubbing or edema b/l.    NEUROLOGIC: Cranial nerves II through XII are intact. No focal Motor or sensory deficits b/l.   PSYCHIATRIC:  patient is alert and oriented x 3.  SKIN: No obvious rash, lesion, or ulcer.   LABORATORY PANEL:  CBC  Recent Labs Lab 07/05/17 0622  WBC 10.2  HGB 11.5*  HCT 36.2  PLT 389    Chemistries   Recent Labs Lab 07/05/17 0011  07/06/17 0409 07/07/17 0328  NA 139  < > 138 138  K 3.4*  < > 3.7 3.5  CL 107  < > 106 109  CO2 23  < > 24 24  GLUCOSE 131*  < > 160* 135*  BUN 31*  < > 31* 35*  CREATININE 2.47*  < > 3.10* 2.86*  CALCIUM 9.3  < > 8.8* 8.9  MG  --   < > 2.0  --   AST 22  --   --   --   ALT 24  --   --   --   ALKPHOS 77  --   --   --   BILITOT 0.8  --   --   --   < > = values in this interval not displayed. Cardiac Enzymes  Recent Labs Lab 07/06/17 0409  TROPONINI 0.12*   RADIOLOGY:  No results found. ASSESSMENT AND PLAN:  Renee [name] is a 41 y.o. female with a known history of HTN presents to the emergency department for evaluation of Shortness of breath.  Patient was in a usual state of health until 6 days ago when she developed symptoms of upper respiratory infection including sinus congestion, rhinorrhea, headache and generalized fatigue.    #. Hypertensive emergency  - BP control. Continue clonidine, hydralazine and labetalol -Added Coreg and amlodipine and -Patient is Weaned off IV nicardipine drip. -Echo done in June 2018 showed LVH with moderate concentric hypertrophy and EF 50-55% -2 g sodium diet recommended  #Type 2 diabetes A1c is 7.5 Patient was diet controlled She is not keen on starting any oral meds although I did recommend her. She was to continue diet  control and follow-up as outpatient Continue sliding scale insulin  #. Community Acquired Pneumonia right lower lobe - IV Rocephin & Azithromycin per pharmacy - Duonebs, expectorants & O2 therapy as needed - Blood culture negative  # Acute on chronic kidney disease stage 3/4 secondary to severe hypertension -Patient recommended to follow-up patient nephrology. He she has appointment with Dr. Holley Raring. -Avoid nephrotoxins  #Morbid obesity -Advised lifestyle changes with exercise and diet control -Dietitian to see  #Care  Management for discharge planning -Patient needs a primary care physician.    Case discussed with Care Management/Social Worker. Management plans discussed with the patient, family and they are in agreement.  CODE STATUS: Full  DVT Prophylaxis: Heparin  TOTAL TIME TAKING CARE OF THIS PATIENT:30  minutes.  >50% time spent on counselling and coordination of carePatient and family  POSSIBLE D/C IN 2-3  DAYS, DEPENDING ON CLINICAL CONDITION.  Note: This dictation was prepared with Dragon dictation along with smaller phrase technology. Any transcriptional errors that result from this process are unintentional.  Renee [name] M.D on 07/07/2017 at 10:51 AM  Between 7am to 6pm - Pager - [phone]  After 6pm go to [email or web address] - password [email or web address] [facility]  Office  [phone]  CC: Primary care physician; [facility]

## 2017-07-08 LAB — RENAL FUNCTION PANEL
Albumin: 3.3 g/dL — ABNORMAL LOW (ref 3.5–5.0)
Anion gap: 8 (ref 5–15)
BUN: 38 mg/dL — ABNORMAL HIGH (ref 6–20)
CALCIUM: 9 mg/dL (ref 8.9–10.3)
CO2: 24 mmol/L (ref 22–32)
CREATININE: 2.89 mg/dL — AB (ref 0.44–1.00)
Chloride: 108 mmol/L (ref 101–111)
GFR calc Af Amer: 22 mL/min — ABNORMAL LOW (ref >60)
GFR calc non Af Amer: 19 mL/min — ABNORMAL LOW (ref >60)
GLUCOSE: 119 mg/dL — AB (ref 65–99)
Phosphorus: 4.7 mg/dL — ABNORMAL HIGH (ref 2.5–4.6)
Potassium: 3.5 mmol/L (ref 3.5–5.1)
SODIUM: 140 mmol/L (ref 135–145)

## 2017-07-08 LAB — URINE DRUG SCREEN, QUALITATIVE (ARMC ONLY)
Amphetamines, Ur Screen: NOT DETECTED
BENZODIAZEPINE, UR SCRN: NOT DETECTED
Barbiturates, Ur Screen: NOT DETECTED
Cannabinoid 50 Ng, Ur ~~LOC~~: NOT DETECTED
Cocaine Metabolite,Ur ~~LOC~~: NOT DETECTED
MDMA (ECSTASY) UR SCREEN: NOT DETECTED
Methadone Scn, Ur: NOT DETECTED
Opiate, Ur Screen: NOT DETECTED
Phencyclidine (PCP) Ur S: NOT DETECTED
Tricyclic, Ur Screen: NOT DETECTED

## 2017-07-08 LAB — GLUCOSE, CAPILLARY
GLUCOSE-CAPILLARY: 108 mg/dL — AB (ref 65–99)
Glucose-Capillary: 115 mg/dL — ABNORMAL HIGH (ref 65–99)
Glucose-Capillary: 148 mg/dL — ABNORMAL HIGH (ref 65–99)
Glucose-Capillary: 148 mg/dL — ABNORMAL HIGH (ref 65–99)

## 2017-07-08 MED ORDER — CARVEDILOL 25 MG PO TABS: 25.0000 mg | ORAL_TABLET | Freq: Two times a day (BID) | ORAL | 2 refills | Status: DC

## 2017-07-08 MED ORDER — AMOXICILLIN-POT CLAVULANATE 875-125 MG PO TABS
1.0000 | ORAL_TABLET | Freq: Two times a day (BID) | ORAL | Status: DC
  Administered 2017-07-08 – 2017-07-09 (×3): 1 via ORAL
  Filled 2017-07-08 (×3): qty 1

## 2017-07-08 MED ORDER — CARVEDILOL 25 MG PO TABS
25.0000 mg | ORAL_TABLET | Freq: Two times a day (BID) | ORAL | Status: DC
  Administered 2017-07-08 – 2017-07-09 (×3): 25 mg via ORAL
  Filled 2017-07-08 (×3): qty 1

## 2017-07-08 MED ORDER — HYDRALAZINE HCL 20 MG/ML IJ SOLN
10.0000 mg | Freq: Four times a day (QID) | INTRAMUSCULAR | Status: DC | PRN
  Administered 2017-07-08 – 2017-07-09 (×2): 10 mg via INTRAVENOUS
  Filled 2017-07-08: qty 1

## 2017-07-08 MED ORDER — HYDRALAZINE HCL 50 MG PO TABS
100.0000 mg | ORAL_TABLET | Freq: Three times a day (TID) | ORAL | Status: DC
  Administered 2017-07-08 – 2017-07-09 (×2): 100 mg via ORAL
  Filled 2017-07-08 (×2): qty 2

## 2017-07-08 MED ORDER — FUROSEMIDE 40 MG PO TABS
40.0000 mg | ORAL_TABLET | Freq: Two times a day (BID) | ORAL | Status: DC
  Administered 2017-07-08: 40 mg via ORAL
  Filled 2017-07-08: qty 1

## 2017-07-08 MED ORDER — LOSARTAN POTASSIUM 100 MG PO TABS: 100.0000 mg | ORAL_TABLET | Freq: Every day | ORAL | 2 refills | Status: DC

## 2017-07-08 MED ORDER — FUROSEMIDE 40 MG PO TABS: 40.0000 mg | ORAL_TABLET | Freq: Every day | ORAL | 1 refills | Status: DC

## 2017-07-08 MED ORDER — CLONIDINE HCL 0.1 MG PO TABS
0.2000 mg | ORAL_TABLET | Freq: Three times a day (TID) | ORAL | Status: DC
  Administered 2017-07-08 – 2017-07-09 (×3): 0.2 mg via ORAL
  Filled 2017-07-08 (×3): qty 2

## 2017-07-08 MED ORDER — AMOXICILLIN-POT CLAVULANATE 875-125 MG PO TABS: 1.0000 | ORAL_TABLET | Freq: Two times a day (BID) | ORAL | 0 refills | Status: DC

## 2017-07-08 NOTE — Progress Notes (Signed)
[facility]  ROUNDING NOTE   Subjective:    Blood pressure still elevated.  Continues to have peripheral edema Denies shortness of breath Blood pressure medications adjusted Serum creatinine remains high at  2.89. Appears to be stabilizing GFR 22  Objective:  Vital signs in last 24 hours:  Temp:  [97.7 F (36.5 C)-98.3 F (36.8 C)] 97.7 F (36.5 C) (07/09 1204) Pulse Rate:  [63-83] 72 (07/09 1633) Resp:  [14-20] 14 (07/09 1204) BP: (165-264)/(81-131) 228/118 (07/09 1633) SpO2:  [95 %-98 %] 98 % (07/09 1204)  Weight change:  Filed Weights   07/04/17 2241  Weight: 115.2 kg (254 lb)    Intake/Output: I/O last 3 completed shifts: In: 360 [P.O.:360] Out: 800 [Urine:800]   Intake/Output this shift:  Total I/O In: 240 [P.O.:240] Out: -   Physical Exam: General: NAD, sitting in bed  Head: Normocephalic, atraumatic. Moist oral mucosal membranes  Eyes: Anicteric,  Neck: Supple, trachea midline  Lungs:  Clear to auscultation  Heart: Regular rate and rhythm, s4 gallop  Abdomen:  Soft, nontender, obese  Extremities: 1+ peripheral edema. b/l  Neurologic: Nonfocal, moving all four extremities  Skin: No lesions       Basic Metabolic Panel:  Recent Labs Lab 07/05/17 0011 07/05/17 0622 07/06/17 0409 07/07/17 0328 07/08/17 0656  NA 139 138 138 138 140  K 3.4* 3.5 3.7 3.5 3.5  CL 107 106 106 109 108  CO2 23 24 24 24 24   GLUCOSE 131* 178* 160* 135* 119*  BUN 31* 30* 31* 35* 38*  CREATININE 2.47* 2.51* 3.10* 2.86* 2.89*  CALCIUM 9.3 9.1 8.8* 8.9 9.0  MG  --  2.0 2.0  --   --   PHOS  --  3.5 3.9 4.1 4.7*    Liver Function Tests:  Recent Labs Lab 07/05/17 0011 07/07/17 0328 07/08/17 0656  AST 22  --   --   ALT 24  --   --   ALKPHOS 77  --   --   BILITOT 0.8  --   --   PROT 7.0  --   --   ALBUMIN 3.5 3.1* 3.3*   No results for input(s): LIPASE, AMYLASE in the last 168 hours. No results for input(s): AMMONIA in the last 168  hours.  CBC:  Recent Labs Lab 07/05/17 0011 07/05/17 0622  WBC 9.6 10.2  NEUTROABS 7.3*  --   HGB 11.8* 11.5*  HCT 37.0 36.2  MCV 73.8* 73.5*  PLT 385 389    Cardiac Enzymes:  Recent Labs Lab 07/05/17 0011 07/05/17 0622 07/05/17 1429 07/05/17 1940 07/06/17 0409  TROPONINI 0.04* 0.03* 0.03* 0.06* 0.12*    BNP: Invalid input(s): POCBNP  CBG:  Recent Labs Lab 07/07/17 1649 07/07/17 2113 07/08/17 0747 07/08/17 1200 07/08/17 1635  GLUCAP 100* 126* 115* 148* 108*    Microbiology: Results for orders placed or performed during the hospital encounter of 07/04/17  Blood Culture (routine x 2)     Status: None (Preliminary result)   Collection Time: 07/05/17 12:11 AM  Result Value Ref Range Status   Specimen Description BLOOD RAC  Final   Special Requests   Final    BOTTLES DRAWN AEROBIC AND ANAEROBIC Blood Culture adequate volume   Culture NO GROWTH 3 DAYS  Final   Report Status PENDING  Incomplete  Blood Culture (routine x 2)     Status: None (Preliminary result)   Collection Time: 07/05/17 12:11 AM  Result Value Ref Range Status   Specimen  Description BLOOD RAC  Final   Special Requests   Final    BOTTLES DRAWN AEROBIC AND ANAEROBIC Blood Culture adequate volume   Culture NO GROWTH 3 DAYS  Final   Report Status PENDING  Incomplete  Urine culture     Status: Abnormal   Collection Time: 07/05/17 12:11 AM  Result Value Ref Range Status   Specimen Description URINE, RANDOM  Final   Special Requests NONE  Final   Culture MULTIPLE ORGANISMS PRESENT, NONE PREDOMINANT (A)  Final   Report Status 07/06/2017 FINAL  Final  MRSA PCR Screening     Status: None   Collection Time: 07/05/17  5:56 AM  Result Value Ref Range Status   MRSA by PCR NEGATIVE NEGATIVE Final    Comment:        The GeneXpert MRSA Assay (FDA approved for NASAL specimens only), is one component of a comprehensive MRSA colonization surveillance program. It is not intended to diagnose  MRSA infection nor to guide or monitor treatment for MRSA infections.     Coagulation Studies: No results for input(s): LABPROT, INR in the last 72 hours.  Urinalysis: No results for input(s): COLORURINE, LABSPEC, PHURINE, GLUCOSEU, HGBUR, BILIRUBINUR, KETONESUR, PROTEINUR, UROBILINOGEN, NITRITE, LEUKOCYTESUR in the last 72 hours.  Invalid input(s): APPERANCEUR    Imaging: No results found.   Medications:    . amoxicillin-clavulanate  1 tablet Oral Q12H  . carvedilol  25 mg Oral BID WC  . cloNIDine  0.2 mg Oral TID  . furosemide  40 mg Oral BID  . guaiFENesin  600 mg Oral BID  . hydrALAZINE  100 mg Oral TID  . insulin aspart  0-9 Units Subcutaneous TID WC  . losartan  100 mg Oral Daily  . sodium chloride flush  3 mL Intravenous Q12H   acetaminophen **OR** acetaminophen, albuterol, benzonatate, bisacodyl, hydrALAZINE, ipratropium, magnesium citrate, ondansetron **OR** ondansetron (ZOFRAN) IV, oxyCODONE, senna-docusate  Assessment/ Plan:  [name] is a 41 y.o. black female with hypertension, diabetes mellitus type II, morbid obesity, anemia, who was admitted to [facility] on 07/04/2017 for pneumonia and hypertensive emergency. Placed on nicardipine gtt and admitted to ICU.   1. Acute renal failure on chronic kidney disease stage IV with proteinuria Creatinine 2.44, GFR 27 on 11/2016.  Acute renal failure from hypertensive urgency Suspect underlying chronic kidney disease secondary to hypertension and diabetes. TSH is normal Urine P/C ratio 0.44 - Started on losartan on this admission.   2. Severe Hypertension with diastolic congestive heart failure and LE edema Home regimen of clonidine, furosemide, hydralazine, labetalol and lisinopril. However lisinopril is giving patient a cough.  - Discontinued lisinopril and started losartan 100mg  daily. Monitor renal function and potassium - Discontinued labetalol and started carvedilol. Now at 25 BID - Restarted clonidine  and hydralazine which patient was not taking. Increase frequency to TID - Furosemide 40mg  PO twice daily.  - consider adding calcium channel blocker - If BP remains uncontrolled in AM would consider evaluation for RAS- patient cannot have contrast CT or MRI - will need to discuss angiogram by vascular   3. Diabetes mellitus type II with chronic kidney disease: hemoglobin A1c 7.1% on 6/16.  - continue glucose control   4. Morbid obesity - Patient will need to work on weight loss - also discussed considering sleep study as outpatient      LOS: 3 Honest Safranek 7/9/20186:20 PM

## 2017-07-08 NOTE — Discharge Summary (Signed)
[place] [facility] [place] NAME: Renee [name]    MR#:  [id number]  DATE OF BIRTH:  1976/03/15  DATE OF ADMISSION:  07/04/2017 ADMITTING PHYSICIAN: Saundra Shelling, MD  DATE OF DISCHARGE: 07/08/2017  PRIMARY CARE PHYSICIAN: [place], [facility]    ADMISSION DIAGNOSIS:  Hypoxia [R09.02] Hypertensive urgency [I16.0] Community acquired pneumonia, unspecified laterality [J18.9] Acute on chronic congestive heart failure, unspecified heart failure type ([place]) [I50.9]  DISCHARGE DIAGNOSIS:  Hypertensive Urgency CKD-4 Dm-2 ---diet control, HbA1c 7.5 Right LL pneumonia CHF acute diastolic  SECONDARY DIAGNOSIS:   Past Medical History:  Diagnosis Date  . Hypertension     HOSPITAL COURSE:  KimberlyDaileyis a 41 y.o.femalewith a known history of HTNpresents to the emergency department for evaluation of Shortness of breath. Patient was in a usual state of health until 6 days ago when she developed symptoms of upper respiratory infection including sinus congestion, rhinorrhea, headache and generalized fatigue.    #. Hypertensive emergency  - BP control. Continue clonidine, hydralazine and labetalol -Added Coreg and losartan -Patient is Weaned off IV nicardipine drip. -Echo done in June 2018 showed LVH with moderate concentric hypertrophy and EF 50-55% -2 g sodium diet recommended  #Type 2 diabetes A1c is 7.5 Patient was on diet control She is not keen on starting any oral meds although I did recommend her. She was to continue diet control and follow-up as outpatient Continue sliding scale insulin  #. 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  she has  appointment with Dr.Kolluru -Avoid nephrotoxins  #Morbid obesity -Advised lifestyle changes with exercise and diet control -Dietitian to see  #Care  Management for discharge planning -Patient needs a primary care physician.  overall much better D/c home  CONSULTS OBTAINED:    DRUG ALLERGIES:  No Known Allergies  DISCHARGE MEDICATIONS:   Current Discharge Medication List    START taking these medications   Details  amoxicillin-clavulanate (AUGMENTIN) 875-125 MG tablet Take 1 tablet by mouth every 12 (twelve) hours. Qty: 6 tablet, Refills: 0    carvedilol (COREG) 25 MG tablet Take 1 tablet (25 mg total) by mouth 2 (two) times daily with a meal. Qty: 60 tablet, Refills: 2    furosemide (LASIX) 40 MG tablet Take 1 tablet (40 mg total) by mouth daily. Qty: 30 tablet, Refills: 1    losartan (COZAAR) 100 MG tablet Take 1 tablet (100 mg total) by mouth daily. Qty: 30 tablet, Refills: 2      CONTINUE these medications which have NOT CHANGED   Details  cloNIDine (CATAPRES) 0.2 MG tablet Take 1 tablet (0.2 mg total) by mouth 2 (two) times daily. Qty: 180 tablet, Refills: 0    hydrALAZINE (APRESOLINE) 100 MG tablet Take 1 tablet (100 mg total) by mouth 2 (two) times daily. Qty: 60 tablet, Refills: 0    labetalol (NORMODYNE) 200 MG tablet Take 1 tablet (200 mg total) by mouth 2 (two) times daily. Qty: 30 tablet, Refills: 0        If you experience worsening of your admission symptoms, develop shortness of breath, life threatening emergency, suicidal or homicidal thoughts you must seek medical attention immediately by calling 911 or calling your MD immediately  if symptoms less severe.  You  Must read complete instructions/literature along with all the possible adverse reactions/side effects for all the Medicines you take and that have been prescribed to you. Take any new Medicines after you have completely understood and accept all the possible adverse reactions/side  effects.   Please note  You were cared for by a hospitalist during your hospital stay. If you have any questions about your discharge medications or the care you received while you were in the hospital after you are discharged, you can call the unit and asked to speak with the hospitalist on call if the hospitalist that took care of you is not available. Once you are discharged, your primary care physician will handle any further medical issues. Please note that NO REFILLS for any discharge medications will be authorized once you are discharged, as it is imperative that you return to your primary care physician (or establish a relationship with a primary care physician if you do not have one) for your aftercare needs so that they can reassess your need for medications and monitor your lab values. Today   SUBJECTIVE   Doing better  VITAL SIGNS:  Blood pressure (!) 233/124, pulse 75, temperature 98.3 F (36.8 C), temperature source Oral, resp. rate 18, height 5\' 5"  (1.651 m), weight 115.2 kg (254 lb), last menstrual period 05/27/2017, SpO2 98 %.  I/O:   Intake/Output Summary (Last 24 hours) at 07/08/17 0823 Last data filed at 07/07/17 2015  Gross per 24 hour  Intake              360 ml  Output              400 ml  Net              -40 ml    PHYSICAL EXAMINATION:  GENERAL:  41 y.o.-year-old patient lying in the bed with no acute distress.  EYES: Pupils equal, round, reactive to light and accommodation. No scleral icterus. Extraocular muscles intact.  HEENT: Head atraumatic, normocephalic. Oropharynx and nasopharynx clear.  NECK:  Supple, no jugular venous distention. No thyroid enlargement, no tenderness.  LUNGS: Normal breath sounds bilaterally, no wheezing, rales,rhonchi or crepitation. No use of accessory muscles of respiration.  CARDIOVASCULAR: S1, S2 normal. No murmurs, rubs, or gallops.  ABDOMEN: Soft, non-tender, non-distended. Bowel sounds present. No organomegaly or mass.   EXTREMITIES: ++pedal edema,no cyanosis, or clubbing.  NEUROLOGIC: Cranial nerves II through XII are intact. Muscle strength 5/5 in all extremities. Sensation intact. Gait not checked.  PSYCHIATRIC: The patient is alert and oriented x 3.  SKIN: No obvious rash, lesion, or ulcer.   DATA REVIEW:   CBC   Recent Labs Lab 07/05/17 0622  WBC 10.2  HGB 11.5*  HCT 36.2  PLT 389    Chemistries   Recent Labs Lab 07/05/17 0011  07/06/17 0409  07/08/17 0656  NA 139  < > 138  < > 140  K 3.4*  < > 3.7  < > 3.5  CL 107  < > 106  < > 108  CO2 23  < > 24  < > 24  GLUCOSE 131*  < > 160*  < > 119*  BUN 31*  < > 31*  < > 38*  CREATININE 2.47*  < > 3.10*  < > 2.89*  CALCIUM 9.3  < > 8.8*  < > 9.0  MG  --   < > 2.0  --   --   AST 22  --   --   --   --  ALT 24  --   --   --   --   ALKPHOS 77  --   --   --   --   BILITOT 0.8  --   --   --   --   < > = values in this interval not displayed.  Microbiology Results   Recent Results (from the past 240 hour(s))  Blood Culture (routine x 2)     Status: None (Preliminary result)   Collection Time: 07/05/17 12:11 AM  Result Value Ref Range Status   Specimen Description BLOOD RAC  Final   Special Requests   Final    BOTTLES DRAWN AEROBIC AND ANAEROBIC Blood Culture adequate volume   Culture NO GROWTH 2 DAYS  Final   Report Status PENDING  Incomplete  Blood Culture (routine x 2)     Status: None (Preliminary result)   Collection Time: 07/05/17 12:11 AM  Result Value Ref Range Status   Specimen Description BLOOD RAC  Final   Special Requests   Final    BOTTLES DRAWN AEROBIC AND ANAEROBIC Blood Culture adequate volume   Culture NO GROWTH 2 DAYS  Final   Report Status PENDING  Incomplete  Urine culture     Status: Abnormal   Collection Time: 07/05/17 12:11 AM  Result Value Ref Range Status   Specimen Description URINE, RANDOM  Final   Special Requests NONE  Final   Culture MULTIPLE ORGANISMS PRESENT, NONE PREDOMINANT (A)  Final   Report  Status 07/06/2017 FINAL  Final  MRSA PCR Screening     Status: None   Collection Time: 07/05/17  5:56 AM  Result Value Ref Range Status   MRSA by PCR NEGATIVE NEGATIVE Final    Comment:        The GeneXpert MRSA Assay (FDA approved for NASAL specimens only), is one component of a comprehensive MRSA colonization surveillance program. It is not intended to diagnose MRSA infection nor to guide or monitor treatment for MRSA infections.     RADIOLOGY:  No results found.   Management plans discussed with the patient, family and they are in agreement.  CODE STATUS:     Code Status Orders        Start     Ordered   07/05/17 0430  Full code  Continuous     07/05/17 0429    Code Status History    Date Active Date Inactive Code Status Order ID Comments User Context   06/15/2017 12:38 AM 06/17/2017  1:14 PM Full Code [id number]  Theodoro Grist, MD Inpatient   12/20/2016 12:21 PM 12/21/2016  5:53 PM Full Code [id number]  Bettey Costa, MD ED      TOTAL TIME TAKING CARE OF THIS PATIENT: 40 minutes.    Jayten Gabbard M.D on 07/08/2017 at 8:23 AM  Between 7am to 6pm - Pager - [phone] After 6pm go to [email or web address] - password [email or web address] [facility]  Office  [phone]  CC: Primary care physician; [facility]

## 2017-07-08 NOTE — Care Management (Signed)
patient for discharge home today and now says she does not go to [facility]. [facility] will not provide appointment to high frequency of no shows.  An appointment has been made with [facility] for 7/16 with Dr Karlyn Agee.  Discussed the importance of follow through with all aspects of medical treatment regime.  Provided patient with information on [employer]  , [employer] four dollar list and coupons for her blood pressure meds

## 2017-07-08 NOTE — Care Management Important Message (Signed)
Important Message  Patient Details  Name: Renee [name] MRN: [id number] Date of Birth: [birth year]   Medicare Important Message Given:  Yes Signed IM notice given    Katrina Stack, RN 07/08/2017, 8:31 AM

## 2017-07-08 NOTE — Discharge Instructions (Signed)
Monitor your BP at home and keep log of it

## 2017-07-08 NOTE — Progress Notes (Signed)
nephrology follow up Appointments       Mon 07/15/2017 2:00 PM - 2:20 PM   w/Sarath Kolluru MD

## 2017-07-08 NOTE — Progress Notes (Signed)
Discharge held due to elevated BP Increased hydralazine to 100 mg tid Lasix 40 mg bid Clonidine 0.2 mg tid  D/w dr Candiss Norse

## 2017-07-09 LAB — GLUCOSE, CAPILLARY
GLUCOSE-CAPILLARY: 164 mg/dL — AB (ref 65–99)
Glucose-Capillary: 123 mg/dL — ABNORMAL HIGH (ref 65–99)

## 2017-07-09 MED ORDER — FUROSEMIDE 40 MG PO TABS
40.0000 mg | ORAL_TABLET | Freq: Two times a day (BID) | ORAL | Status: DC
  Administered 2017-07-09: 40 mg via ORAL
  Filled 2017-07-09: qty 1

## 2017-07-09 MED ORDER — CLONIDINE HCL 0.2 MG PO TABS: 0.2000 mg | ORAL_TABLET | Freq: Three times a day (TID) | ORAL | 0 refills | Status: DC

## 2017-07-09 MED ORDER — FUROSEMIDE 40 MG PO TABS: 40.0000 mg | ORAL_TABLET | Freq: Two times a day (BID) | ORAL | 1 refills | Status: DC

## 2017-07-09 MED ORDER — FUROSEMIDE 10 MG/ML IJ SOLN: 40.0000 mg | Freq: Two times a day (BID) | INTRAMUSCULAR | Status: DC

## 2017-07-09 MED ORDER — HYDRALAZINE HCL 100 MG PO TABS: 100.0000 mg | ORAL_TABLET | Freq: Three times a day (TID) | ORAL | 0 refills | Status: DC

## 2017-07-09 NOTE — Consult Note (Addendum)
   [facility] CM Inpatient Consult   07/09/2017  Madisson Kulaga 02-05-1976 [id number]   Referral received for [facility] services by inpatient case manager. Attempted to call into patient's room but patient had been discharged. Let [facility] care management assistant know to reroute referral. For questions please contact:  Javaeh Muscatello RN, [facility] Liaison  [phone]) [place] [phone]) Toll free office

## 2017-07-09 NOTE — Progress Notes (Signed)
[facility]  ROUNDING NOTE   Subjective:    Blood pressure still elevated.  Continues to have peripheral edema Denies shortness of breath Blood pressure medications adjusted Serum creatinine remains high at  2.89. Appears to be stabilizing GFR 22 Patient reports good response to Lasix yesterday. She was given multiple handouts by the dietary team regarding CKD and low-salt diet.  Objective:  Vital signs in last 24 hours:  Temp:  [97.7 F (36.5 C)-98.4 F (36.9 C)] 97.8 F (36.6 C) (07/10 1151) Pulse Rate:  [56-75] 67 (07/10 1151) Resp:  [16-18] 17 (07/10 1151) BP: (188-224)/(88-110) 188/88 (07/10 1151) SpO2:  [97 %-100 %] 98 % (07/10 1151)  Weight change:  Filed Weights   07/04/17 2241  Weight: 115.2 kg (254 lb)    Intake/Output: I/O last 3 completed shifts: In: 480 [P.O.:480] Out: 1400 [Urine:1400]   Intake/Output this shift:  Total I/O In: 240 [P.O.:240] Out: -   Physical Exam: General: NAD, sitting in bed  Head: Normocephalic, atraumatic. Moist oral mucosal membranes  Eyes: Anicteric,  Neck: Supple, trachea midline  Lungs:  Clear to auscultation  Heart: Regular rate and rhythm, s4 gallop  Abdomen:  Soft, nontender, obese  Extremities: 2+ peripheral edema. b/l  Neurologic: Nonfocal, moving all four extremities  Skin: No lesions       Basic Metabolic Panel:  Recent Labs Lab 07/05/17 0011 07/05/17 0622 07/06/17 0409 07/07/17 0328 07/08/17 0656  NA 139 138 138 138 140  K 3.4* 3.5 3.7 3.5 3.5  CL 107 106 106 109 108  CO2 23 24 24 24 24   GLUCOSE 131* 178* 160* 135* 119*  BUN 31* 30* 31* 35* 38*  CREATININE 2.47* 2.51* 3.10* 2.86* 2.89*  CALCIUM 9.3 9.1 8.8* 8.9 9.0  MG  --  2.0 2.0  --   --   PHOS  --  3.5 3.9 4.1 4.7*    Liver Function Tests:  Recent Labs Lab 07/05/17 0011 07/07/17 0328 07/08/17 0656  AST 22  --   --   ALT 24  --   --   ALKPHOS 77  --   --   BILITOT 0.8  --   --   PROT 7.0  --   --   ALBUMIN 3.5 3.1*  3.3*   No results for input(s): LIPASE, AMYLASE in the last 168 hours. No results for input(s): AMMONIA in the last 168 hours.  CBC:  Recent Labs Lab 07/05/17 0011 07/05/17 0622  WBC 9.6 10.2  NEUTROABS 7.3*  --   HGB 11.8* 11.5*  HCT 37.0 36.2  MCV 73.8* 73.5*  PLT 385 389    Cardiac Enzymes:  Recent Labs Lab 07/05/17 0011 07/05/17 0622 07/05/17 1429 07/05/17 1940 07/06/17 0409  TROPONINI 0.04* 0.03* 0.03* 0.06* 0.12*    BNP: Invalid input(s): POCBNP  CBG:  Recent Labs Lab 07/08/17 1200 07/08/17 1635 07/08/17 2200 07/09/17 0756 07/09/17 1153  GLUCAP 148* 108* 148* 123* 164*    Microbiology: Results for orders placed or performed during the hospital encounter of 07/04/17  Blood Culture (routine x 2)     Status: None (Preliminary result)   Collection Time: 07/05/17 12:11 AM  Result Value Ref Range Status   Specimen Description BLOOD RAC  Final   Special Requests   Final    BOTTLES DRAWN AEROBIC AND ANAEROBIC Blood Culture adequate volume   Culture NO GROWTH 4 DAYS  Final   Report Status PENDING  Incomplete  Blood Culture (routine x 2)  Status: None (Preliminary result)   Collection Time: 07/05/17 12:11 AM  Result Value Ref Range Status   Specimen Description BLOOD RAC  Final   Special Requests   Final    BOTTLES DRAWN AEROBIC AND ANAEROBIC Blood Culture adequate volume   Culture NO GROWTH 4 DAYS  Final   Report Status PENDING  Incomplete  Urine culture     Status: Abnormal   Collection Time: 07/05/17 12:11 AM  Result Value Ref Range Status   Specimen Description URINE, RANDOM  Final   Special Requests NONE  Final   Culture MULTIPLE ORGANISMS PRESENT, NONE PREDOMINANT (A)  Final   Report Status 07/06/2017 FINAL  Final  MRSA PCR Screening     Status: None   Collection Time: 07/05/17  5:56 AM  Result Value Ref Range Status   MRSA by PCR NEGATIVE NEGATIVE Final    Comment:        The GeneXpert MRSA Assay (FDA approved for NASAL  specimens only), is one component of a comprehensive MRSA colonization surveillance program. It is not intended to diagnose MRSA infection nor to guide or monitor treatment for MRSA infections.     Coagulation Studies: No results for input(s): LABPROT, INR in the last 72 hours.  Urinalysis: No results for input(s): COLORURINE, LABSPEC, PHURINE, GLUCOSEU, HGBUR, BILIRUBINUR, KETONESUR, PROTEINUR, UROBILINOGEN, NITRITE, LEUKOCYTESUR in the last 72 hours.  Invalid input(s): APPERANCEUR    Imaging: No results found.   Medications:       Assessment/ Plan:  Ms. Jakira Mcfadden is a 41 y.o. black female with hypertension, diabetes mellitus type II, morbid obesity, anemia, who was admitted to [facility] on 07/04/2017 for pneumonia and hypertensive emergency. Placed on nicardipine gtt and admitted to ICU.   1. Acute renal failure on chronic kidney disease stage IV with proteinuria Creatinine 2.44, GFR 27 on 11/2016.  Acute renal failure from hypertensive urgency Suspect underlying chronic kidney disease secondary to hypertension and diabetes. TSH is normal Urine P/C ratio 0.44 - Started on losartan on this admission.   2. Severe Hypertension with diastolic congestive heart failure and LE edema Home regimen of clonidine, furosemide, hydralazine, labetalol and lisinopril. However lisinopril is giving patient a cough.  - Discontinued lisinopril and started losartan 100mg  daily. Monitor renal function and potassium - Discontinued labetalol and started carvedilol. Now at 25 BID - Restarted clonidine and hydralazine which patient was not taking. Increase frequency to TID - Furosemide 40mg  PO twice daily.  - consider adding calcium channel blocker as outpatient - further workup would include sleep study to rule out sleep apnea, also possibly CT angiogram to rule out renal artery stenosis if blood pressure remains uncontrolled  3. Diabetes mellitus type II with chronic kidney disease:  hemoglobin A1c 7.1% on 6/16.  - continue glucose control   4. Morbid obesity - Patient will need to work on weight loss - also discussed considering sleep study as outpatient      LOS: 4 Tyreshia Ingman 7/10/20184:45 PM

## 2017-07-09 NOTE — Discharge Summary (Signed)
[facility] [place] NAME: Renee [name]    MR#:  [id number]  DATE OF BIRTH:  Jan 28, 1976  DATE OF ADMISSION:  07/04/2017 ADMITTING PHYSICIAN: Saundra Shelling, MD  DATE OF DISCHARGE: 07/09/2017  PRIMARY CARE PHYSICIAN: [place], [facility]    ADMISSION DIAGNOSIS:  Hypoxia [R09.02] Hypertensive urgency [I16.0] Community acquired pneumonia, unspecified laterality [J18.9] Acute on chronic congestive heart failure, unspecified heart failure type ([address]) [I50.9]  DISCHARGE DIAGNOSIS:  Hypertensive Urgency CKD-4 Dm-2 ---diet control, HbA1c 7.5 Right LL pneumonia CHF acute diastolic  SECONDARY DIAGNOSIS:   Past Medical History:  Diagnosis Date  . Hypertension     HOSPITAL COURSE:  KimberlyDaileyis a 41 y.o.femalewith a known history of HTNpresents to the emergency department for evaluation of Shortness of breath. Patient was in a usual state of health until 6 days ago when she developed symptoms of upper respiratory infection including sinus congestion, rhinorrhea, headache and generalized fatigue.    #. Hypertensive emergency  - BP control. Continue clonidine, hydralazine and labetalol -Added Coreg and losartan -Patient is Weaned off IV nicardipine drip. -Echo done in June 2018 showed LVH with moderate concentric hypertrophy and EF 50-55% -2 g sodium diet recommended  #Type 2 diabetes A1c is 7.5 Patient was on diet control She is not keen on starting any oral meds although I did recommend her. She was to continue diet control and follow-up as outpatient Continue sliding scale insulin  #. 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  she has  appointment with Dr.Kolluru -Avoid nephrotoxins  #Morbid obesity -Advised lifestyle changes with exercise and diet control -Dietitian to see  #Care  Management for discharge planning -Patient needs a primary care physician.  overall much better D/c home  CONSULTS OBTAINED:    DRUG ALLERGIES:  No Known Allergies  DISCHARGE MEDICATIONS:   Current Discharge Medication List    START taking these medications   Details  amoxicillin-clavulanate (AUGMENTIN) 875-125 MG tablet Take 1 tablet by mouth every 12 (twelve) hours. Qty: 6 tablet, Refills: 0    carvedilol (COREG) 25 MG tablet Take 1 tablet (25 mg total) by mouth 2 (two) times daily with a meal. Qty: 60 tablet, Refills: 2    furosemide (LASIX) 40 MG tablet Take 1 tablet (40 mg total) by mouth 2 (two) times daily. Qty: 60 tablet, Refills: 1    losartan (COZAAR) 100 MG tablet Take 1 tablet (100 mg total) by mouth daily. Qty: 30 tablet, Refills: 2      CONTINUE these medications which have CHANGED   Details  cloNIDine (CATAPRES) 0.2 MG tablet Take 1 tablet (0.2 mg total) by mouth 3 (three) times daily. Qty: 180 tablet, Refills: 0    hydrALAZINE (APRESOLINE) 100 MG tablet Take 1 tablet (100 mg total) by mouth 3 (three) times daily. Qty: 60 tablet, Refills: 0      CONTINUE these medications which have NOT CHANGED   Details  labetalol (NORMODYNE) 200 MG tablet Take 1 tablet (200 mg total) by mouth 2 (two) times daily. Qty: 30 tablet, Refills: 0        If you experience worsening of your admission symptoms, develop shortness of breath, life threatening emergency, suicidal or homicidal thoughts you must seek medical attention immediately  by calling 911 or calling your MD immediately  if symptoms less severe.  You Must read complete instructions/literature along with all the possible adverse reactions/side effects for all the Medicines you take and that have been prescribed to you. Take any new Medicines after you  have completely understood and accept all the possible adverse reactions/side effects.   Please note  You were cared for by a hospitalist during your hospital stay. If you have any questions about your discharge medications or the care you received while you were in the hospital after you are discharged, you can call the unit and asked to speak with the hospitalist on call if the hospitalist that took care of you is not available. Once you are discharged, your primary care physician will handle any further medical issues. Please note that NO REFILLS for any discharge medications will be authorized once you are discharged, as it is imperative that you return to your primary care physician (or establish a relationship with a primary care physician if you do not have one) for your aftercare needs so that they can reassess your need for medications and monitor your lab values. Today   SUBJECTIVE   Doing better  VITAL SIGNS:  Blood pressure (!) 188/88, pulse 67, temperature 97.8 F (36.6 C), temperature source Oral, resp. rate 17, height 5\' 5"  (1.651 m), weight 115.2 kg (254 lb), last menstrual period 05/27/2017, SpO2 98 %.  I/O:    Intake/Output Summary (Last 24 hours) at 07/09/17 1310 Last data filed at 07/09/17 0924  Gross per 24 hour  Intake              480 ml  Output             1000 ml  Net             -520 ml    PHYSICAL EXAMINATION:  GENERAL:  41 y.o.-year-old patient lying in the bed with no acute distress.  EYES: Pupils equal, round, reactive to light and accommodation. No scleral icterus. Extraocular muscles intact.  HEENT: Head atraumatic, normocephalic. Oropharynx and nasopharynx clear.  NECK:  Supple, no jugular venous distention. No thyroid enlargement, no tenderness.  LUNGS: Normal breath sounds bilaterally, no wheezing, rales,rhonchi or crepitation. No use of accessory muscles of respiration.  CARDIOVASCULAR: S1, S2 normal. No murmurs, rubs, or gallops.  ABDOMEN: Soft,  non-tender, non-distended. Bowel sounds present. No organomegaly or mass.  EXTREMITIES: ++pedal edema,no cyanosis, or clubbing.  NEUROLOGIC: Cranial nerves II through XII are intact. Muscle strength 5/5 in all extremities. Sensation intact. Gait not checked.  PSYCHIATRIC: The patient is alert and oriented x 3.  SKIN: No obvious rash, lesion, or ulcer.   DATA REVIEW:   CBC   Recent Labs Lab 07/05/17 0622  WBC 10.2  HGB 11.5*  HCT 36.2  PLT 389    Chemistries   Recent Labs Lab 07/05/17 0011  07/06/17 0409  07/08/17 0656  NA 139  < > 138  < > 140  K 3.4*  < > 3.7  < > 3.5  CL 107  < > 106  < > 108  CO2 23  < > 24  < > 24  GLUCOSE 131*  < > 160*  < > 119*  BUN 31*  < > 31*  < > 38*  CREATININE 2.47*  < > 3.10*  < > 2.89*  CALCIUM 9.3  < > 8.8*  < > 9.0  MG  --   < > 2.0  --   --  AST 22  --   --   --   --   ALT 24  --   --   --   --   ALKPHOS 77  --   --   --   --   BILITOT 0.8  --   --   --   --   < > = values in this interval not displayed.  Microbiology Results   Recent Results (from the past 240 hour(s))  Blood Culture (routine x 2)     Status: None (Preliminary result)   Collection Time: 07/05/17 12:11 AM  Result Value Ref Range Status   Specimen Description BLOOD RAC  Final   Special Requests   Final    BOTTLES DRAWN AEROBIC AND ANAEROBIC Blood Culture adequate volume   Culture NO GROWTH 4 DAYS  Final   Report Status PENDING  Incomplete  Blood Culture (routine x 2)     Status: None (Preliminary result)   Collection Time: 07/05/17 12:11 AM  Result Value Ref Range Status   Specimen Description BLOOD RAC  Final   Special Requests   Final    BOTTLES DRAWN AEROBIC AND ANAEROBIC Blood Culture adequate volume   Culture NO GROWTH 4 DAYS  Final   Report Status PENDING  Incomplete  Urine culture     Status: Abnormal   Collection Time: 07/05/17 12:11 AM  Result Value Ref Range Status   Specimen Description URINE, RANDOM  Final   Special Requests NONE  Final    Culture MULTIPLE ORGANISMS PRESENT, NONE PREDOMINANT (A)  Final   Report Status 07/06/2017 FINAL  Final  MRSA PCR Screening     Status: None   Collection Time: 07/05/17  5:56 AM  Result Value Ref Range Status   MRSA by PCR NEGATIVE NEGATIVE Final    Comment:        The GeneXpert MRSA Assay (FDA approved for NASAL specimens only), is one component of a comprehensive MRSA colonization surveillance program. It is not intended to diagnose MRSA infection nor to guide or monitor treatment for MRSA infections.     RADIOLOGY:  No results found.   Management plans discussed with the patient, family and they are in agreement.  CODE STATUS:     Code Status Orders        Start     Ordered   07/05/17 0430  Full code  Continuous     07/05/17 0429    Code Status History    Date Active Date Inactive Code Status Order ID Comments User Context   06/15/2017 12:38 AM 06/17/2017  1:14 PM Full Code [id number]  Theodoro Grist, MD Inpatient   12/20/2016 12:21 PM 12/21/2016  5:53 PM Full Code [id number]  Bettey Costa, MD ED      TOTAL TIME TAKING CARE OF THIS PATIENT: 40 minutes.    Anastacio Bua M.D on 07/09/2017 at 1:10 PM  Between 7am to 6pm - Pager - [phone] After 6pm go to [email or web address] - password [email or web address] [facility]  Office  [phone]  CC: Primary care physician; [facility]

## 2017-07-09 NOTE — Care Management (Addendum)
Barrier- blood pressure remains consistently elevated. [facility] and [facility] referrals. Current diagnoses relatively new.

## 2017-07-10 LAB — CULTURE, BLOOD (ROUTINE X 2)
CULTURE: NO GROWTH
Culture: NO GROWTH
SPECIAL REQUESTS: ADEQUATE
SPECIAL REQUESTS: ADEQUATE

## 2017-07-11 ENCOUNTER — Other Ambulatory Visit: Payer: Self-pay | Admitting: *Deleted

## 2017-07-11 ENCOUNTER — Encounter: Payer: Self-pay | Admitting: *Deleted

## 2017-07-11 NOTE — Patient Outreach (Signed)
[facility]) [place], Transition of Care attempt #1  07/11/2017  [name] 18-Jun-1976 [id number]  Unsuccessful telephone outreach to [place] Daily, 41 y/o female referred to [place] by IP CM for transition of care after recent hospitalization July 5-10, 2018 for shortness of breath with hypoxia, HTN urgency, CAP, and acute on chronic CHF exacerbation.  Patient has history including, but not limited to, cardiomyopathy, malignant HTN, DM, CKD stg III.  HIPAA compliant voice mail message left for patient, requesting return call back.  Plan:  Will re-attempt [place] telephone outreach again tomorrow if I do not hear back from patient later today.  Oneta Rack, RN, BSN, [employer] [facility] Care Management  [phone]

## 2017-07-12 ENCOUNTER — Encounter: Payer: Self-pay | Admitting: *Deleted

## 2017-07-12 ENCOUNTER — Other Ambulatory Visit: Payer: Self-pay | Admitting: *Deleted

## 2017-07-12 NOTE — Patient Outreach (Signed)
[facility]) [place], Transition of Care attempt #2  07/12/2017  Renee [name] Oct 14, 1976 [id number]  Unsuccessful telephone outreach to [place] Daily, 41 y/o female referred to [place] by IP CM for transition of care after recent hospitalization July 5-10, 2018 for shortness of breath with hypoxia, HTN urgency, CAP, and acute on chronic CHF exacerbation.  Patient has history including, but not limited to, cardiomyopathy, malignant HTN, DM, CKD stg III.  HIPAA compliant voice mail (VM) message left for patient, requesting return call back.  Explained in my VM message that primary [place] would contact patient again next week for ongoing transition of care, if I did not hear back from patient later today.  Plan:  Will update patient's primary [facility] RN CM of today's unsuccessful telephone outreach attempt to patient.  Oneta Rack, RN, BSN, [employer] [facility] Care Management  [phone]

## 2017-07-15 DIAGNOSIS — R809 Proteinuria, unspecified: Secondary | ICD-10-CM | POA: Diagnosis not present

## 2017-07-15 DIAGNOSIS — I129 Hypertensive chronic kidney disease with stage 1 through stage 4 chronic kidney disease, or unspecified chronic kidney disease: Secondary | ICD-10-CM | POA: Diagnosis not present

## 2017-07-15 DIAGNOSIS — N183 Chronic kidney disease, stage 3 (moderate): Secondary | ICD-10-CM | POA: Diagnosis not present

## 2017-07-15 DIAGNOSIS — E1122 Type 2 diabetes mellitus with diabetic chronic kidney disease: Secondary | ICD-10-CM | POA: Diagnosis not present

## 2017-07-15 DIAGNOSIS — N179 Acute kidney failure, unspecified: Secondary | ICD-10-CM | POA: Diagnosis not present

## 2017-07-16 ENCOUNTER — Other Ambulatory Visit: Payer: Self-pay | Admitting: *Deleted

## 2017-07-16 NOTE — Patient Outreach (Signed)
Attempt made to contact Renee [name] 41 year old female referred to [place] by inpatient CM for transition of care after recent hospitalization July 5-10, 2018 for Hypoxia, Hypertensive urgency, Community acquired pneumonia, Acute on chronic congestive heart failure.   Person answering the phone reports pt not available/can I leave pt a message to which RN CM provided name and number for pt to return call.     Plan:  Two attempts were made by coworker Richarda Osmond RN CM  last week (07/11/17 and 07/12/17),covering for this RN CM to contact pt for transition of care- voice messages left/no responses.   If no response to request for pt to return RN CM's call, plan to send unable to contact letter.    Zara Chess.   [place] Care Management  [phone]

## 2017-07-16 NOTE — Patient Outreach (Signed)
Second attempt made to contact [place] Daily, 41 year old female referred to [place] by inpatient CM for transition of care after recent hospitalization July 5-10,2018 for Hypoxia, Hypertensive Urgency, community acquired pneumonia as this RN CM called pt earlier today, contact name and number given to person answering the phone with request to return phone call.    11:23 am received a voice message from pt returning call, requested RN CM to call her.   HIPAA compliant voice message left with contact name and number.   Plan:  If no response, will try again tomorrow.     Renee [name].   [place] Care Management  [phone]

## 2017-07-18 ENCOUNTER — Other Ambulatory Visit: Payer: Self-pay | Admitting: *Deleted

## 2017-07-18 ENCOUNTER — Encounter: Payer: Self-pay | Admitting: *Deleted

## 2017-07-18 NOTE — Patient Outreach (Signed)
1:44 pm Usuccessful telephone encounter to Renee [name], 41 year old female ( third attempt), follow up on referral to [place] by inpatient CM for transition of care after recent hospitalization July 5-10, 2018 for Hypoxia,hypertensive urgency, CAP, acute on chronic CHF.   HIPAA compliant voice message left with contact name and number with request to return phone call.    1:48 pm-  Received a return phone call from pt to voice message left.   Spoke with pt, discussed purpose of call - follow up on referral from inpatient CM for transition of care/ recent hospital discharge, HIPAA identifiers provided.  RN CM discussed with pt [facility] transition of care program follow - 31 days post discharge (weekly calls, a home visit) no cost to her to which pt gave verbal consent.  Pt reports doing good now, follow up with Kidney MD 07/16/17, informed kidneys are doing good.  Pt reports was suppose to follow up with new PCP (Dr. Karlyn Agee - [place] ) 07/16/17 but did not have insurance information, have an appointment on 07/23/17.  Pt reports no sob, edema, not been weighing as has no scale, agreed to get one soon.   Pt reports taking all of her medications as ordered (post discharge), no problems affording, does not have discharge papers available to review medications with RN CM at this time, currently not home.      Plan: As discussed with pt, plan to follow up again next week telephonically (part of ongoing transition of care).            Barrier letter sent to Dr. Juleen China informing of [facility] involvement.          Plan to inform [facility] care management assistant pt agreed to [facility] community nurse case management             Services.      Zara Chess.   [place] Care Management  [phone]

## 2017-07-25 ENCOUNTER — Other Ambulatory Visit: Payer: Self-pay | Admitting: *Deleted

## 2017-07-25 ENCOUNTER — Ambulatory Visit: Payer: Medicare Other | Admitting: Family

## 2017-07-25 ENCOUNTER — Telehealth: Payer: Self-pay | Admitting: Family

## 2017-07-25 ENCOUNTER — Ambulatory Visit: Payer: Self-pay | Admitting: *Deleted

## 2017-07-25 NOTE — Patient Outreach (Signed)
Unsuccessful telephone encounter to [place] Ode, 41 year old female- ongoing transition of care after recent hospitalization July 5-10,2018 for Hypoxia, Hypertensive urgerncy, CAP, acute on chronic CHF.   HIPAA compliant voice message left with contact name and number.   Plan:  If no response to voice message left, plan to follow up again telephonically tomorrow.   Zara Chess.   [place] Care Management  [phone]

## 2017-07-25 NOTE — Telephone Encounter (Signed)
Patient missed her initial appointment at the [facility] on 07/25/17. Will attempt to reschedule.

## 2017-07-26 ENCOUNTER — Other Ambulatory Visit: Payer: Self-pay | Admitting: *Deleted

## 2017-07-26 ENCOUNTER — Ambulatory Visit: Payer: Self-pay | Admitting: *Deleted

## 2017-07-26 NOTE — Patient Outreach (Signed)
Second attempt made to contact pt -  part of ongoing transition of care for follow up on recent hospitalization July 5-10,2018 for Hypoxia, Hypertensive urgency,CAP, acute on chronic CHF.  Person answering pt's phone reports pt not here now.   Request made by RN CM to have pt return phone call/ name provided/number on pt's phone.     Plan:  If do not receive a return phone call from pt as requested, plan to follow up again next week telephonically.     Zara Chess.   [place] Care Management  [phone]

## 2017-08-02 ENCOUNTER — Ambulatory Visit: Payer: Self-pay | Admitting: *Deleted

## 2017-08-02 ENCOUNTER — Other Ambulatory Visit: Payer: Self-pay | Admitting: *Deleted

## 2017-08-02 NOTE — Patient Outreach (Signed)
Unsuccessful telephone encounter to Renee [name], 41 year old female- third attempt call made as part of ongoing transition of care/follow up on recent hospitalization July 5-10,2018 for Hypoxia, Hypertensive urgency, CAP, acute on chronic CHF.   HIPAA compliant voice message left with contact name and number, requesting a return phone call.    Plan:  If no response to voice message and this being the third call- plan to send an unable to contact letter and if no response to letter in 10 business days to close case,notify MD.    Zara Chess.   [place] Care Management  [phone]

## 2017-08-04 ENCOUNTER — Emergency Department
Admission: EM | Admit: 2017-08-04 | Discharge: 2017-08-05 | Discharge status: Against Medical Advice | Payer: Medicare Other

## 2017-08-05 ENCOUNTER — Encounter: Payer: Self-pay | Admitting: *Deleted

## 2017-08-08 ENCOUNTER — Emergency Department
Admission: EM | Admit: 2017-08-08 | Discharge: 2017-08-08 | Disposition: A | Discharge status: Home / Self Care | Payer: Medicare Other | Attending: Emergency Medicine | Admitting: Emergency Medicine

## 2017-08-08 ENCOUNTER — Encounter: Payer: Self-pay | Admitting: Emergency Medicine

## 2017-08-08 ENCOUNTER — Emergency Department: Payer: Medicare Other

## 2017-08-08 DIAGNOSIS — Y929 Unspecified place or not applicable: Secondary | ICD-10-CM | POA: Diagnosis not present

## 2017-08-08 DIAGNOSIS — I129 Hypertensive chronic kidney disease with stage 1 through stage 4 chronic kidney disease, or unspecified chronic kidney disease: Secondary | ICD-10-CM | POA: Diagnosis not present

## 2017-08-08 DIAGNOSIS — S4992XA Unspecified injury of left shoulder and upper arm, initial encounter: Secondary | ICD-10-CM | POA: Diagnosis not present

## 2017-08-08 DIAGNOSIS — N183 Chronic kidney disease, stage 3 (moderate): Secondary | ICD-10-CM | POA: Diagnosis not present

## 2017-08-08 DIAGNOSIS — E119 Type 2 diabetes mellitus without complications: Secondary | ICD-10-CM | POA: Diagnosis not present

## 2017-08-08 DIAGNOSIS — N179 Acute kidney failure, unspecified: Secondary | ICD-10-CM | POA: Diagnosis not present

## 2017-08-08 DIAGNOSIS — M25512 Pain in left shoulder: Secondary | ICD-10-CM | POA: Diagnosis not present

## 2017-08-08 DIAGNOSIS — M7918 Myalgia, other site: Secondary | ICD-10-CM

## 2017-08-08 DIAGNOSIS — I160 Hypertensive urgency: Secondary | ICD-10-CM | POA: Diagnosis not present

## 2017-08-08 DIAGNOSIS — M791 Myalgia: Secondary | ICD-10-CM | POA: Diagnosis not present

## 2017-08-08 DIAGNOSIS — Y939 Activity, unspecified: Secondary | ICD-10-CM | POA: Insufficient documentation

## 2017-08-08 DIAGNOSIS — M79622 Pain in left upper arm: Secondary | ICD-10-CM | POA: Diagnosis present

## 2017-08-08 DIAGNOSIS — Y998 Other external cause status: Secondary | ICD-10-CM | POA: Diagnosis not present

## 2017-08-08 DIAGNOSIS — Z79899 Other long term (current) drug therapy: Secondary | ICD-10-CM | POA: Diagnosis not present

## 2017-08-08 MED ORDER — CYCLOBENZAPRINE HCL 10 MG PO TABS: 10.0000 mg | ORAL_TABLET | Freq: Three times a day (TID) | ORAL | 0 refills | Status: DC | PRN

## 2017-08-08 MED ORDER — TRAMADOL HCL 50 MG PO TABS: 50.0000 mg | ORAL_TABLET | Freq: Four times a day (QID) | ORAL | 0 refills | Status: DC | PRN
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
## 2017-08-12 DIAGNOSIS — D649 Anemia, unspecified: Secondary | ICD-10-CM | POA: Diagnosis not present

## 2017-08-12 DIAGNOSIS — I10 Essential (primary) hypertension: Secondary | ICD-10-CM | POA: Diagnosis not present

## 2017-08-12 DIAGNOSIS — E1122 Type 2 diabetes mellitus with diabetic chronic kidney disease: Secondary | ICD-10-CM | POA: Diagnosis not present

## 2017-08-12 DIAGNOSIS — I5032 Chronic diastolic (congestive) heart failure: Secondary | ICD-10-CM | POA: Diagnosis not present

## 2017-08-12 DIAGNOSIS — Z7689 Persons encountering health services in other specified circumstances: Secondary | ICD-10-CM | POA: Diagnosis not present

## 2017-08-12 DIAGNOSIS — Z6841 Body Mass Index (BMI) 40.0 and over, adult: Secondary | ICD-10-CM | POA: Diagnosis not present

## 2017-08-12 DIAGNOSIS — N184 Chronic kidney disease, stage 4 (severe): Secondary | ICD-10-CM | POA: Diagnosis not present

## 2017-08-13 ENCOUNTER — Encounter: Payer: Self-pay | Admitting: Family

## 2017-08-13 ENCOUNTER — Ambulatory Visit: Payer: Medicare Other | Attending: Family | Admitting: Family

## 2017-08-13 VITALS — BP 190/90 | HR 64 | Resp 18 | Ht 65.0 in | Wt 250.5 lb

## 2017-08-13 DIAGNOSIS — E669 Obesity, unspecified: Secondary | ICD-10-CM | POA: Diagnosis not present

## 2017-08-13 DIAGNOSIS — R0683 Snoring: Secondary | ICD-10-CM | POA: Diagnosis not present

## 2017-08-13 DIAGNOSIS — I5032 Chronic diastolic (congestive) heart failure: Secondary | ICD-10-CM

## 2017-08-13 DIAGNOSIS — Z79899 Other long term (current) drug therapy: Secondary | ICD-10-CM | POA: Diagnosis not present

## 2017-08-13 DIAGNOSIS — I110 Hypertensive heart disease with heart failure: Secondary | ICD-10-CM | POA: Insufficient documentation

## 2017-08-13 DIAGNOSIS — E119 Type 2 diabetes mellitus without complications: Secondary | ICD-10-CM | POA: Diagnosis not present

## 2017-08-13 DIAGNOSIS — E1122 Type 2 diabetes mellitus with diabetic chronic kidney disease: Secondary | ICD-10-CM

## 2017-08-13 DIAGNOSIS — N184 Chronic kidney disease, stage 4 (severe): Secondary | ICD-10-CM

## 2017-08-13 DIAGNOSIS — I10 Essential (primary) hypertension: Secondary | ICD-10-CM

## 2017-08-13 DIAGNOSIS — Z9889 Other specified postprocedural states: Secondary | ICD-10-CM | POA: Insufficient documentation

## 2017-08-13 NOTE — Patient Instructions (Signed)
Continue weighing daily and call for an overnight weight gain of > 2 pounds or a weekly weight gain of >5 pounds. 
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
## 2017-08-14 DIAGNOSIS — I5032 Chronic diastolic (congestive) heart failure: Secondary | ICD-10-CM | POA: Insufficient documentation

## 2017-08-26 ENCOUNTER — Other Ambulatory Visit: Payer: Self-pay | Admitting: *Deleted

## 2017-08-26 ENCOUNTER — Encounter: Payer: Self-pay | Admitting: *Deleted

## 2017-09-09 ENCOUNTER — Ambulatory Visit: Payer: Medicare Other | Admitting: Family

## 2017-09-17 ENCOUNTER — Ambulatory Visit: Payer: Medicare Other | Attending: Specialist

## 2017-09-17 DIAGNOSIS — G4730 Sleep apnea, unspecified: Secondary | ICD-10-CM | POA: Diagnosis not present

## 2017-09-17 DIAGNOSIS — G4733 Obstructive sleep apnea (adult) (pediatric): Secondary | ICD-10-CM | POA: Insufficient documentation

## 2018-01-22 ENCOUNTER — Other Ambulatory Visit: Payer: Self-pay

## 2018-01-22 ENCOUNTER — Encounter: Payer: Self-pay | Admitting: Emergency Medicine

## 2018-01-22 ENCOUNTER — Inpatient Hospital Stay
Admit: 2018-01-22 | Discharge: 2018-01-22 | Disposition: A | Discharge status: Home / Self Care | Payer: Medicare Other | Attending: Internal Medicine | Admitting: Internal Medicine

## 2018-01-22 ENCOUNTER — Emergency Department: Payer: Medicare Other

## 2018-01-22 ENCOUNTER — Inpatient Hospital Stay
Admission: EM | Admit: 2018-01-22 | Discharge: 2018-01-25 | DRG: 291 | Disposition: A | Discharge status: Home / Self Care | Payer: Medicare Other | Attending: Internal Medicine | Admitting: Internal Medicine

## 2018-01-22 DIAGNOSIS — R0602 Shortness of breath: Secondary | ICD-10-CM | POA: Diagnosis not present

## 2018-01-22 DIAGNOSIS — F17210 Nicotine dependence, cigarettes, uncomplicated: Secondary | ICD-10-CM | POA: Diagnosis not present

## 2018-01-22 DIAGNOSIS — I10 Essential (primary) hypertension: Secondary | ICD-10-CM | POA: Diagnosis not present

## 2018-01-22 DIAGNOSIS — N184 Chronic kidney disease, stage 4 (severe): Secondary | ICD-10-CM | POA: Diagnosis not present

## 2018-01-22 DIAGNOSIS — J209 Acute bronchitis, unspecified: Secondary | ICD-10-CM | POA: Diagnosis present

## 2018-01-22 DIAGNOSIS — E1122 Type 2 diabetes mellitus with diabetic chronic kidney disease: Secondary | ICD-10-CM | POA: Diagnosis present

## 2018-01-22 DIAGNOSIS — E876 Hypokalemia: Secondary | ICD-10-CM | POA: Diagnosis not present

## 2018-01-22 DIAGNOSIS — I248 Other forms of acute ischemic heart disease: Secondary | ICD-10-CM | POA: Diagnosis present

## 2018-01-22 DIAGNOSIS — I509 Heart failure, unspecified: Secondary | ICD-10-CM

## 2018-01-22 DIAGNOSIS — F17200 Nicotine dependence, unspecified, uncomplicated: Secondary | ICD-10-CM | POA: Diagnosis present

## 2018-01-22 DIAGNOSIS — N179 Acute kidney failure, unspecified: Secondary | ICD-10-CM | POA: Diagnosis present

## 2018-01-22 DIAGNOSIS — R0789 Other chest pain: Secondary | ICD-10-CM | POA: Diagnosis not present

## 2018-01-22 DIAGNOSIS — I130 Hypertensive heart and chronic kidney disease with heart failure and stage 1 through stage 4 chronic kidney disease, or unspecified chronic kidney disease: Principal | ICD-10-CM | POA: Diagnosis present

## 2018-01-22 DIAGNOSIS — Z6841 Body Mass Index (BMI) 40.0 and over, adult: Secondary | ICD-10-CM | POA: Diagnosis not present

## 2018-01-22 DIAGNOSIS — R05 Cough: Secondary | ICD-10-CM | POA: Diagnosis not present

## 2018-01-22 DIAGNOSIS — I110 Hypertensive heart disease with heart failure: Secondary | ICD-10-CM | POA: Diagnosis not present

## 2018-01-22 DIAGNOSIS — R0600 Dyspnea, unspecified: Secondary | ICD-10-CM

## 2018-01-22 DIAGNOSIS — R946 Abnormal results of thyroid function studies: Secondary | ICD-10-CM | POA: Diagnosis present

## 2018-01-22 DIAGNOSIS — I5032 Chronic diastolic (congestive) heart failure: Secondary | ICD-10-CM | POA: Diagnosis not present

## 2018-01-22 DIAGNOSIS — I5033 Acute on chronic diastolic (congestive) heart failure: Secondary | ICD-10-CM | POA: Diagnosis not present

## 2018-01-22 DIAGNOSIS — I5031 Acute diastolic (congestive) heart failure: Secondary | ICD-10-CM | POA: Diagnosis not present

## 2018-01-22 DIAGNOSIS — N183 Chronic kidney disease, stage 3 unspecified: Secondary | ICD-10-CM | POA: Diagnosis present

## 2018-01-22 DIAGNOSIS — N189 Chronic kidney disease, unspecified: Secondary | ICD-10-CM | POA: Diagnosis present

## 2018-01-22 DIAGNOSIS — R0902 Hypoxemia: Secondary | ICD-10-CM | POA: Diagnosis not present

## 2018-01-22 DIAGNOSIS — E119 Type 2 diabetes mellitus without complications: Secondary | ICD-10-CM | POA: Diagnosis not present

## 2018-01-22 LAB — CBC
HCT: 40.3 % (ref 35.0–47.0)
Hemoglobin: 13.1 g/dL (ref 12.0–16.0)
MCH: 24.7 pg — ABNORMAL LOW (ref 26.0–34.0)
MCHC: 32.5 g/dL (ref 32.0–36.0)
MCV: 76 fL — ABNORMAL LOW (ref 80.0–100.0)
PLATELETS: 282 10*3/uL (ref 150–440)
RBC: 5.31 MIL/uL — ABNORMAL HIGH (ref 3.80–5.20)
RDW: 16 % — AB (ref 11.5–14.5)
WBC: 12.2 10*3/uL — ABNORMAL HIGH (ref 3.6–11.0)

## 2018-01-22 LAB — COMPREHENSIVE METABOLIC PANEL
ALBUMIN: 3.4 g/dL — AB (ref 3.5–5.0)
ALT: 17 U/L (ref 14–54)
ANION GAP: 9 (ref 5–15)
AST: 22 U/L (ref 15–41)
Alkaline Phosphatase: 80 U/L (ref 38–126)
BUN: 35 mg/dL — ABNORMAL HIGH (ref 6–20)
CO2: 23 mmol/L (ref 22–32)
Calcium: 9.1 mg/dL (ref 8.9–10.3)
Chloride: 99 mmol/L — ABNORMAL LOW (ref 101–111)
Creatinine, Ser: 2.71 mg/dL — ABNORMAL HIGH (ref 0.44–1.00)
GFR calc Af Amer: 24 mL/min — ABNORMAL LOW (ref >60)
GFR calc non Af Amer: 21 mL/min — ABNORMAL LOW (ref >60)
GLUCOSE: 291 mg/dL — AB (ref 65–99)
Potassium: 3.6 mmol/L (ref 3.5–5.1)
Sodium: 131 mmol/L — ABNORMAL LOW (ref 135–145)
TOTAL PROTEIN: 8 g/dL (ref 6.5–8.1)
Total Bilirubin: 1 mg/dL (ref 0.3–1.2)

## 2018-01-22 LAB — GLUCOSE, CAPILLARY
GLUCOSE-CAPILLARY: 182 mg/dL — AB (ref 65–99)
Glucose-Capillary: 199 mg/dL — ABNORMAL HIGH (ref 65–99)

## 2018-01-22 LAB — TROPONIN I
TROPONIN I: 0.07 ng/mL — AB (ref <0.03)
Troponin I: 0.06 ng/mL (ref <0.03)
Troponin I: 0.06 ng/mL (ref <0.03)

## 2018-01-22 LAB — BRAIN NATRIURETIC PEPTIDE: B Natriuretic Peptide: 1906 pg/mL — ABNORMAL HIGH (ref 0.0–100.0)

## 2018-01-22 MED ORDER — FUROSEMIDE 10 MG/ML IJ SOLN
60.0000 mg | Freq: Once | INTRAMUSCULAR | Status: AC
  Administered 2018-01-22: 60 mg via INTRAVENOUS
  Filled 2018-01-22: qty 8

## 2018-01-22 MED ORDER — ENOXAPARIN SODIUM 40 MG/0.4ML ~~LOC~~ SOLN
40.0000 mg | Freq: Two times a day (BID) | SUBCUTANEOUS | Status: DC
  Administered 2018-01-22: 40 mg via SUBCUTANEOUS
  Filled 2018-01-22 (×2): qty 0.4

## 2018-01-22 MED ORDER — HYDRALAZINE HCL 20 MG/ML IJ SOLN
20.0000 mg | Freq: Once | INTRAMUSCULAR | Status: AC
  Administered 2018-01-22: 20 mg via INTRAVENOUS
  Filled 2018-01-22: qty 1

## 2018-01-22 MED ORDER — INSULIN ASPART 100 UNIT/ML ~~LOC~~ SOLN
0.0000 [IU] | Freq: Three times a day (TID) | SUBCUTANEOUS | Status: DC
  Administered 2018-01-22: 4 [IU] via SUBCUTANEOUS
  Administered 2018-01-23: 11 [IU] via SUBCUTANEOUS
  Administered 2018-01-23: 3 [IU] via SUBCUTANEOUS
  Administered 2018-01-23 – 2018-01-24 (×2): 7 [IU] via SUBCUTANEOUS
  Administered 2018-01-24: 4 [IU] via SUBCUTANEOUS
  Administered 2018-01-24: 7 [IU] via SUBCUTANEOUS
  Administered 2018-01-25: 4 [IU] via SUBCUTANEOUS
  Filled 2018-01-22 (×8): qty 1

## 2018-01-22 MED ORDER — CARVEDILOL 25 MG PO TABS
25.0000 mg | ORAL_TABLET | Freq: Two times a day (BID) | ORAL | Status: DC
  Administered 2018-01-22 – 2018-01-25 (×6): 25 mg via ORAL
  Filled 2018-01-22 (×6): qty 1

## 2018-01-22 MED ORDER — SODIUM CHLORIDE 0.9% FLUSH: 3.0000 mL | INTRAVENOUS | Status: DC | PRN

## 2018-01-22 MED ORDER — AMLODIPINE BESYLATE 5 MG PO TABS
5.0000 mg | ORAL_TABLET | Freq: Every day | ORAL | Status: DC
  Administered 2018-01-22 – 2018-01-23 (×2): 5 mg via ORAL
  Filled 2018-01-22 (×3): qty 1

## 2018-01-22 MED ORDER — ONDANSETRON HCL 4 MG PO TABS: 4.0000 mg | ORAL_TABLET | Freq: Four times a day (QID) | ORAL | Status: DC | PRN

## 2018-01-22 MED ORDER — HYDROCODONE-ACETAMINOPHEN 5-325 MG PO TABS: 1.0000 | ORAL_TABLET | ORAL | Status: DC | PRN

## 2018-01-22 MED ORDER — ONDANSETRON HCL 4 MG/2ML IJ SOLN
4.0000 mg | Freq: Four times a day (QID) | INTRAMUSCULAR | Status: DC | PRN
  Administered 2018-01-22: 4 mg via INTRAVENOUS
  Filled 2018-01-22: qty 2

## 2018-01-22 MED ORDER — FUROSEMIDE 10 MG/ML IJ SOLN
40.0000 mg | Freq: Two times a day (BID) | INTRAMUSCULAR | Status: DC
  Administered 2018-01-22 – 2018-01-24 (×4): 40 mg via INTRAVENOUS
  Filled 2018-01-22 (×4): qty 4

## 2018-01-22 MED ORDER — POLYETHYLENE GLYCOL 3350 17 G PO PACK: 17.0000 g | PACK | Freq: Every day | ORAL | Status: DC | PRN

## 2018-01-22 MED ORDER — LOSARTAN POTASSIUM 50 MG PO TABS
100.0000 mg | ORAL_TABLET | Freq: Every day | ORAL | Status: DC
  Administered 2018-01-23 – 2018-01-25 (×3): 100 mg via ORAL
  Filled 2018-01-22 (×3): qty 2

## 2018-01-22 MED ORDER — LABETALOL HCL 5 MG/ML IV SOLN: 10.0000 mg | Freq: Four times a day (QID) | INTRAVENOUS | Status: DC | PRN

## 2018-01-22 MED ORDER — CLONIDINE HCL 0.1 MG PO TABS
0.2000 mg | ORAL_TABLET | Freq: Three times a day (TID) | ORAL | Status: DC
  Administered 2018-01-22 – 2018-01-23 (×3): 0.2 mg via ORAL
  Filled 2018-01-22 (×3): qty 2

## 2018-01-22 MED ORDER — ACETAMINOPHEN 325 MG PO TABS
650.0000 mg | ORAL_TABLET | Freq: Four times a day (QID) | ORAL | Status: DC | PRN
  Administered 2018-01-24: 650 mg via ORAL
  Filled 2018-01-22: qty 2

## 2018-01-22 MED ORDER — BISACODYL 5 MG PO TBEC: 5.0000 mg | DELAYED_RELEASE_TABLET | Freq: Every day | ORAL | Status: DC | PRN

## 2018-01-22 MED ORDER — SODIUM CHLORIDE 0.9 % IV SOLN: 250.0000 mL | INTRAVENOUS | Status: DC | PRN

## 2018-01-22 MED ORDER — ACETAMINOPHEN 650 MG RE SUPP: 650.0000 mg | Freq: Four times a day (QID) | RECTAL | Status: DC | PRN

## 2018-01-22 MED ORDER — HYDRALAZINE HCL 50 MG PO TABS
100.0000 mg | ORAL_TABLET | Freq: Three times a day (TID) | ORAL | Status: DC
  Administered 2018-01-22 – 2018-01-23 (×3): 100 mg via ORAL
  Filled 2018-01-22 (×3): qty 2

## 2018-01-22 MED ORDER — SODIUM CHLORIDE 0.9% FLUSH
3.0000 mL | Freq: Two times a day (BID) | INTRAVENOUS | Status: DC
  Administered 2018-01-22 – 2018-01-25 (×7): 3 mL via INTRAVENOUS

## 2018-01-22 MED ORDER — NITROGLYCERIN 2 % TD OINT
0.5000 [in_us] | TOPICAL_OINTMENT | Freq: Four times a day (QID) | TRANSDERMAL | Status: DC
  Administered 2018-01-22 – 2018-01-23 (×5): 0.5 [in_us] via TOPICAL
  Filled 2018-01-22 (×5): qty 1

## 2018-01-22 NOTE — ED Notes (Signed)
Patient transported to X-ray 

## 2018-01-22 NOTE — Progress Notes (Signed)
Pharmacist - Prescriber Communication  Enoxaparin dose modified from 40 mg subcutaneously once daily to 40 mg subcutaneously twice daily due to BMI greater than 40.  Blandina Renaldo A. [place], [place].D., BCPS Clinical Pharmacist 01/22/18 14:32

## 2018-01-22 NOTE — Plan of Care (Signed)
  Progressing Clinical Measurements: Ability to maintain clinical measurements within normal limits will improve 01/22/2018 1648 - Progressing by Liliane Channel, RN Respiratory complications will improve 01/22/2018 1648 - Progressing by Liliane Channel, RN Activity: Risk for activity intolerance will decrease 01/22/2018 1648 - Progressing by Liliane Channel, RN Pain Managment: General experience of comfort will improve 01/22/2018 1648 - Progressing by Liliane Channel, RN Skin Integrity: Risk for impaired skin integrity will decrease 01/22/2018 1648 - Progressing by Liliane Channel, RN

## 2018-01-22 NOTE — Consult Note (Signed)
Cardiology Consultation Note    Patient ID: Renee [name], MRN: [id number], DOB/AGE: Jul 27, 1976 42 y.o. Admit date: 01/22/2018   Date of Consult: 01/22/2018 Primary Physician: Minda Ditto, MD Primary Cardiologist: None  Chief Complaint: sob/cough Reason for Consultation: chf/hypertension Requesting MD: Dr. Benjie Karvonen  HPI: Renee [name] is a 42 y.o. female with history of hypertension, chronic diastolic heart failure with preserved LV function and a known ejection fraction of 60-65% by echocardiogram in July 2018, history of chronic renal insufficiency who was admitted with increasing shortness of breath and cough.  Chest x-ray in the emergency room revealed no acute cardiopulmonary disease.  There is no florid pulmonary edema.  Her blood pressure was significantly elevated with a systolic pressure in the 341 range.  Patient has had a problem with noncompliance in the past but states she has taken all of her medicines up until at least yesterday.  She developed nausea cough and shortness of breath yesterday.  She presented to the emergency room where she was noted to be hypertensive with increased lower extremity edema.  She has a complaint of nasal congestion, ear pain and a cough.  Again chest x-ray did not reveal florid pulmonary edema.  Electrocardiogram revealed sinus rhythm with nonspecific ST-T wave changes.  Laboratories revealed her chronic renal insufficiency with serum creatinine of 2.71 with a BUN of 35.  Sodium is 131.  GFR was 24 mL/min.  CBC revealed no significant anemia.  Troponin was minimally elevated at 0.06.  Her BNP was 1906 up from 1712 6 months ago.  7 months ago was 462 pg/mL.  Her scheduled antihypertensive regimen at home included carvedilol 25 mg twice daily, clonidine 0.2 mg 3 times daily, furosemide 40 mg twice daily, hydralazine 100 mg 3 times daily, losartan 100 mg daily.  She attempts to reduce her caloric intake but has a fairly high sodium intake.  Past Medical  History:  Diagnosis Date  . CHF (congestive heart failure) ([place])   . Hypertension       Surgical History:  Past Surgical History:  Procedure Laterality Date  . CESAREAN SECTION    . TUBAL LIGATION       Home Meds: Prior to Admission medications   Medication Sig Start Date End Date Taking? Authorizing Provider  carvedilol (COREG) 25 MG tablet Take 1 tablet (25 mg total) by mouth 2 (two) times daily with a meal. 07/08/17  Yes Fritzi Mandes, MD  cloNIDine (CATAPRES) 0.2 MG tablet Take 1 tablet (0.2 mg total) by mouth 3 (three) times daily. 07/09/17  Yes Fritzi Mandes, MD  furosemide (LASIX) 40 MG tablet Take 1 tablet (40 mg total) by mouth 2 (two) times daily. Patient taking differently: Take 40 mg by mouth daily.  07/09/17  Yes Fritzi Mandes, MD  hydrALAZINE (APRESOLINE) 100 MG tablet Take 1 tablet (100 mg total) by mouth 3 (three) times daily. 07/09/17  Yes Fritzi Mandes, MD  losartan (COZAAR) 100 MG tablet Take 1 tablet (100 mg total) by mouth daily. 07/08/17  Yes Fritzi Mandes, MD    Inpatient Medications:  . amLODipine  5 mg Oral Daily  . carvedilol  25 mg Oral BID WC  . cloNIDine  0.2 mg Oral TID  . enoxaparin (LOVENOX) injection  40 mg Subcutaneous BID  . furosemide  40 mg Intravenous BID  . hydrALAZINE  100 mg Oral TID  . insulin aspart  0-20 Units Subcutaneous TID WC  . losartan  100 mg Oral Daily  . nitroGLYCERIN  0.5  inch Topical Q6H  . sodium chloride flush  3 mL Intravenous Q12H   . sodium chloride      Allergies: No Known Allergies  Social History   Socioeconomic History  . Marital status: Single    Spouse name: Not on file  . Number of children: Not on file  . Years of education: Not on file  . Highest education level: Not on file  Social Needs  . Financial resource strain: Not on file  . Food insecurity - worry: Not on file  . Food insecurity - inability: Not on file  . Transportation needs - medical: Not on file  . Transportation needs - non-medical: Not on file   Occupational History  . Not on file  Tobacco Use  . Smoking status: Never Smoker  . Smokeless tobacco: Never Used  Substance and Sexual Activity  . Alcohol use: No  . Drug use: No  . Sexual activity: Not on file  Other Topics Concern  . Not on file  Social History Narrative  . Not on file     No family history on file.   Review of Systems: A 12-system review of systems was performed and is negative except as noted in the HPI.  Labs: Recent Labs    01/22/18 1130  TROPONINI 0.06*   Lab Results  Component Value Date   WBC 12.2 (H) 01/22/2018   HGB 13.1 01/22/2018   HCT 40.3 01/22/2018   MCV 76.0 (L) 01/22/2018   PLT 282 01/22/2018    Recent Labs  Lab 01/22/18 1130  NA 131*  K 3.6  CL 99*  CO2 23  BUN 35*  CREATININE 2.71*  CALCIUM 9.1  PROT 8.0  BILITOT 1.0  ALKPHOS 80  ALT 17  AST 22  GLUCOSE 291*   Lab Results  Component Value Date   CHOL 159 07/05/2017   HDL 32 (L) 07/05/2017   LDLCALC 105 (H) 07/05/2017   TRIG 111 07/05/2017   No results found for: DDIMER  Radiology/Studies:  Dg Chest 2 View  Result Date: 01/22/2018 CLINICAL DATA:  Shortness of breath. EXAM: CHEST  2 VIEW COMPARISON:  07/04/2017 FINDINGS: Moderate cardiac enlargement. There is no pleural effusion or edema identified. No airspace opacities identified. The visualized osseous structures are unremarkable. IMPRESSION: No active cardiopulmonary disease. Electronically Signed   By: Kerby Moors M.D.   On: 01/22/2018 11:43    Wt Readings from Last 3 Encounters:  01/22/18 115.2 kg (254 lb)  08/13/17 113.6 kg (250 lb 8 oz)  08/08/17 115.2 kg (254 lb)    EKG: Normal sinus rhythm with nonspecific ST-T wave changes.  Is evidence of LVH.  Physical Exam:  Blood pressure (!) 173/72, pulse 77, temperature 98.7 F (37.1 C), temperature source Oral, resp. rate 20, height 5\' 2"  (1.575 m), weight 115.2 kg (254 lb), last menstrual period 12/15/2017, SpO2 100 %. Body mass index is 46.46  kg/m. General: Well developed, well nourished, in no acute distress. Head: Normocephalic, atraumatic, sclera non-icteric, no xanthomas, nares are without discharge.  Neck: Negative for carotid bruits. JVD not elevated. Lungs: Clear bilaterally to auscultation without wheezes, rales, or rhonchi. Breathing is unlabored. Heart: RRR with S1 S2.  1/6 to 2/6 systolic murmur.  She has an S4 gallop. Abdomen: Soft, non-tender, non-distended with normoactive bowel sounds. No hepatomegaly. No rebound/guarding. No obvious abdominal masses. Msk:  Strength and tone appear normal for age. Extremities: No clubbing or cyanosis.  2-3+ pitting edema distal pedal pulses are 2+ and  equal bilaterally. Neuro: Alert and oriented X 3. No facial asymmetry. No focal deficit. Moves all extremities spontaneously. Psych:  Responds to questions appropriately with a normal affect.     Assessment and Plan  Patient is a 42 year old female with history of accelerated hypertension, history of chronic renal insufficiency with a baseline creatinine of approximately 2.7, history of heart failure with preserved ejection fraction who was admitted again with progressive hypertension and shortness of breath.  Chest x-ray did not show florid pulmonary edema.  She reports compliance with her medications.  She still states she may have missed 1 or 2 of her Lasix yesterday but states she took the remainder of her medications.  Due to concern over possible intermittent noncompliance, consideration for changing from clonidine to alternative medications to guard against rebound hypertension which may be occurring in May.  Some noncompliance.  It may be difficult to control her blood pressure off of this however.  We will continue with hydralazine at 100 mg 3 times daily for now.  Will add nitrates as there is evidence of improved response in African-American population with a combination of hydralazine and nitrates rather than ARB/ACE inhibitor.   Would consider discontinuing losartan if her pressure improves.  Would also consider tapering off of clonidine if possible.  Will add amlodipine 5 mg daily with plans of increasing to 10 mg daily.  Side effect of peripheral edema may occur.  Low-sodium diet is recommended and will need to be stressed.  Again compliance with medication also will need to be stressed.  Echocardiogram is pending however patient has had a preserved LV function with echo before 5 months ago.  Elevated troponin is likely demand ischemia given hypertension in the face of renal insufficiency.  Patient is not appear to be having an acute coronary syndrome.  Would not anticoagulate.  Signed, Teodoro Spray MD 01/22/2018, 3:30 PM Pager: [phone]

## 2018-01-22 NOTE — ED Provider Notes (Addendum)
[facility] Emergency Department Provider Note  Time seen: 11:10 AM  I have reviewed the triage vital signs and the nursing notes.   HISTORY  Chief Complaint No chief complaint on file.    HPI Renee [name] is a 43 y.o. female with a past medical history of CHF, hypertension, presents to the emergency department for cough and shortness of breath.  According to the patient last week she felt very congested with nasal congestion and ear pain.  States that had resolved however 2 days ago she started with cough and shortness of breath worse when lying flat.  Patient states she had to sit upright last night to sleep.  Had noticed a slight increase in lower extremity edema.  Believes she might have missed a few of her furosemide tablets, but she is not sure.  Denies any chest pain except when coughing states somewhat of a tightness sensation.  Denies any leg pain.  Denies any current shortness of breath, but does sit upright in bed for comfort.   Past Medical History:  Diagnosis Date  . CHF (congestive heart failure) ([place])   . Hypertension     Patient Active Problem List   Diagnosis Date Noted  . Chronic diastolic heart failure ([place]) 08/14/2017  . Hypertensive emergency 07/05/2017  . 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 Nausea and vomiting 06/14/2017  . Acute on chronic renal failure ([place]) 06/14/2017  . Hyponatremia 06/14/2017  . Hypokalemia 06/14/2017  . Leg swelling 06/14/2017  . Cardiomyopathy ([place]) 04/08/2015  . Chronic kidney disease (CKD), stage III (moderate) ([place]) 04/08/2015  . Accelerated hypertension 04/08/2015  . Diabetes mellitus, type 2 ([place]) 04/08/2015    Past Surgical History:  Procedure Laterality Date  . CESAREAN SECTION    . TUBAL LIGATION      Prior to Admission medications   Medication Sig Start Date End Date Taking? Authorizing Provider  carvedilol (COREG) 25 MG tablet Take 1 tablet (25 mg total) by mouth 2 (two) times daily with  a meal. 07/08/17   Fritzi Mandes, MD  cloNIDine (CATAPRES) 0.2 MG tablet Take 1 tablet (0.2 mg total) by mouth 3 (three) times daily. 07/09/17   Fritzi Mandes, MD  furosemide (LASIX) 40 MG tablet Take 1 tablet (40 mg total) by mouth 2 (two) times daily. Patient taking differently: Take 40 mg by mouth daily.  07/09/17   Fritzi Mandes, MD  hydrALAZINE (APRESOLINE) 100 MG tablet Take 1 tablet (100 mg total) by mouth 3 (three) times daily. 07/09/17   Fritzi Mandes, MD  losartan (COZAAR) 100 MG tablet Take 1 tablet (100 mg total) by mouth daily. 07/08/17   Fritzi Mandes, MD    No Known Allergies  No family history on file.  Social History Social History   Tobacco Use  . Smoking status: Never Smoker  . Smokeless tobacco: Never Used  Substance Use Topics  . Alcohol use: No  . Drug use: No    Review of Systems Constitutional: Negative for fever. Eyes: Negative for visual complaints ENT: Congestion last week which has cleared. Cardiovascular: Slight chest tightness with cough only. Respiratory: Mild shortness of breath when lying flat.  Positive for cough occasional white sputum production. Gastrointestinal: Negative for abdominal pain, vomiting Genitourinary: Negative for urinary compaints Musculoskeletal: Negative for leg pain, mild increase in lower extremity edema per patient. Skin: Negative for skin complaints  Neurological: Negative for headache All other ROS negative  ____________________________________________   PHYSICAL EXAM:  Constitutional: Alert and oriented. Well appearing and in  no distress. Eyes: Normal exam ENT   Head: Normocephalic and atraumatic.  Normal tympanic membranes.   Nose: No congestion/rhinnorhea.   Mouth/Throat: Mucous membranes are moist.  No pharyngeal erythema or exudate. Cardiovascular: Normal rate, regular rhythm Respiratory: Normal respiratory effort without tachypnea nor retractions. Breath sounds are clear.  Occasional cough during examination,  occasional spitting out white sputum. Gastrointestinal: Soft and nontender. No distention. Musculoskeletal: Nontender with normal range of motion in all extremities.  1+ lower extremity edema bilaterally.  Nontender calves. Neurologic:  Normal speech and language. No gross focal neurologic deficits  Skin:  Skin is warm, dry and intact.  Psychiatric: Mood and affect are normal.  ____________________________________________    EKG  EKG reviewed and interpreted by myself shows a normal sinus rhythm 87 bpm with a narrow QRS, normal axis, normal intervals, nonspecific but no concerning ST changes.  ____________________________________________    RADIOLOGY  CXR negative  ____________________________________________   INITIAL IMPRESSION / ASSESSMENT AND PLAN / ED COURSE  Pertinent labs & imaging results that were available during my care of the patient were reviewed by me and considered in my medical decision making (see chart for details).  Patient presents to the emergency department for 2 days of shortness of breath cough, worse when lying flat especially at night in bed.  Differential would include upper respiratory infection, pneumonia, CHF exacerbation, pulmonary edema or interstitial edema.  Currently the patient is satting 93% on room air, no home O2 requirement.  Blood pressure is elevated 867 systolic currently.  Patient states she took her normal morning medications this morning but her lunchtime medications are due shortly.  We will dose her normal blood pressure medications.  We will check labs including cardiac panel.  We will obtain a chest x-ray, EKG and continue to closely monitor.  Overall the patient appears well, no distress, sitting upright in bed no apparent difficulty breathing at this time.  Denies any chest pain currently.   Patient's BNP is elevated at 1900, slight troponin elevation 0.06.  Renal insufficiency largely unchanged.  Patient is currently satting 92% in  bed.  With minimal ambulation desats to 86% with a good waveform.  Given the patient's cough shortness of breath unable to lie flat we will admit the patient to the hospital for further treatment including IV diuresis.  We will dose IV Lasix and admit to the hospitalist service.  Dose 20 mg of IV hydralazine for continued hypertension and reordered home medications. ____________________________________________   FINAL CLINICAL IMPRESSION(S) / ED DIAGNOSES  Cough Orthopnea Congestive heart failure exacerbation Hypoxia   Harvest Dark, MD 01/22/18 1244    Harvest Dark, MD 01/22/18 1301

## 2018-01-22 NOTE — ED Triage Notes (Signed)
Pt comes into the ED via [facility] from home c/o cough, excess sputum, ear pain, and chest pain that has been going on since Monday.  Patient states she had it and it became slightly better until a couple of days ago when it came back worse.  Patient has productive cough with white sputum.  Patient is a CHF patient with borderline diabetes and HTN. Patient has even and unlabored respirations at this time and all VSS with EMS.

## 2018-01-22 NOTE — Plan of Care (Signed)
Living with heart failure booklet reviewed.  Understanding voiced.

## 2018-01-22 NOTE — H&P (Signed)
[facility] NAME: Renee [name]    MR#:  [id number]  DATE OF BIRTH:  1976/03/25  DATE OF ADMISSION:  01/22/2018  PRIMARY CARE PHYSICIAN: Minda Ditto, MD   REQUESTING/REFERRING PHYSICIAN: dr Kerman Passey  CHIEF COMPLAINT:   SOB HISTORY OF PRESENT ILLNESS:  Renee [name]  is a 42 y.o. 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  Patient reports increasing shortness of breath, dyspnea exertion, increasing lower extremity edema and cough over the past 2 days. She has been sleeping upright over the past several days due to the symptoms. She says she is compliant with her medications however to the ER physician she says she may have missed her Lasix tablets. She denies fever, chills or chest pain.  She has been given IV Lasix. Her blood pressure is noted to be very elevated in the emergency room up to 941 systolic. She has been restarted on her outpatient medications.  PAST MEDICAL HISTORY:   Past Medical History:  Diagnosis Date  . CHF (congestive heart failure) ([place])   . Hypertension     PAST SURGICAL HISTORY:   Past Surgical History:  Procedure Laterality Date  . CESAREAN SECTION    . TUBAL LIGATION      SOCIAL HISTORY:   Social History   Tobacco Use  . Smoking status: Never Smoker  . Smokeless tobacco: Never Used  Substance Use Topics  . Alcohol use: No    FAMILY HISTORY:  No family history on file.  DRUG ALLERGIES:  No Known Allergies  REVIEW OF SYSTEMS:   Review of Systems  Constitutional: Negative.  Negative for chills, fever and malaise/fatigue.  HENT: Negative.  Negative for ear discharge, ear pain, hearing loss, nosebleeds and sore throat.   Eyes: Negative.  Negative for blurred vision and pain.  Respiratory: Positive for cough  and shortness of breath. Negative for hemoptysis and wheezing.   Cardiovascular: Positive for orthopnea, leg swelling and PND. Negative for chest pain and palpitations.  Gastrointestinal: Negative.  Negative for abdominal pain, blood in stool, diarrhea, nausea and vomiting.  Genitourinary: Negative.  Negative for dysuria.  Musculoskeletal: Negative.  Negative for back pain.  Skin: Negative.   Neurological: Negative for dizziness, tremors, speech change, focal weakness, seizures and headaches.  Endo/Heme/Allergies: Negative.  Does not bruise/bleed easily.  Psychiatric/Behavioral: Negative.  Negative for depression, hallucinations and suicidal ideas.    MEDICATIONS AT HOME:   Prior to Admission medications   Medication Sig Start Date End Date Taking? Authorizing Provider  carvedilol (COREG) 25 MG tablet Take 1 tablet (25 mg total) by mouth 2 (two) times daily with a meal. 07/08/17  Yes Fritzi Mandes, MD  cloNIDine (CATAPRES) 0.2 MG tablet Take 1 tablet (0.2 mg total) by mouth 3 (three) times daily. 07/09/17  Yes Fritzi Mandes, MD  furosemide (LASIX) 40 MG tablet Take 1 tablet (40 mg total) by mouth 2 (two) times daily. Patient taking differently: Take 40 mg by mouth daily.  07/09/17  Yes Fritzi Mandes, MD  hydrALAZINE (APRESOLINE) 100 MG tablet Take 1 tablet (100 mg total) by mouth 3 (three) times daily. 07/09/17  Yes Fritzi Mandes, MD  losartan (COZAAR) 100 MG tablet Take 1 tablet (100 mg total) by mouth daily. 07/08/17  Yes Fritzi Mandes, MD      VITAL SIGNS:  Blood pressure (!) 202/97,  pulse 81, temperature 98.7 F (37.1 C), temperature source Oral, resp. rate (!) 25, height 5\' 2"  (1.575 m), weight 115.2 kg (254 lb), last menstrual period 12/15/2017, SpO2 (S) (!) 86 %.  PHYSICAL EXAMINATION:   Physical Exam  Constitutional: She is oriented to person, place, and time. No distress.  Sitting upright with increased work of breathing but not labored  HENT:  Head: Normocephalic.  Eyes: No scleral  icterus.  Neck: Normal range of motion. Neck supple. JVD present. No tracheal deviation present.  Cardiovascular: Normal rate, regular rhythm and normal heart sounds. Exam reveals no gallop and no friction rub.  No murmur heard. Pulmonary/Chest: Effort normal and breath sounds normal. No respiratory distress. She has no wheezes. She has no rales. She exhibits no tenderness.  Abdominal: Soft. Bowel sounds are normal. She exhibits no distension and no mass. There is no tenderness. There is no rebound and no guarding.  Musculoskeletal: Normal range of motion. She exhibits edema.  Neurological: She is alert and oriented to person, place, and time.  Skin: Skin is warm. No rash noted. No erythema.  Psychiatric: Affect and judgment normal.      LABORATORY PANEL:   CBC Recent Labs  Lab 01/22/18 1130  WBC 12.2*  HGB 13.1  HCT 40.3  PLT 282   ------------------------------------------------------------------------------------------------------------------  Chemistries  Recent Labs  Lab 01/22/18 1130  NA 131*  K 3.6  CL 99*  CO2 23  GLUCOSE 291*  BUN 35*  CREATININE 2.71*  CALCIUM 9.1  AST 22  ALT 17  ALKPHOS 80  BILITOT 1.0   ------------------------------------------------------------------------------------------------------------------  Cardiac Enzymes Recent Labs  Lab 01/22/18 1130  TROPONINI 0.06*   ------------------------------------------------------------------------------------------------------------------  RADIOLOGY:  Dg Chest 2 View  Result Date: 01/22/2018 CLINICAL DATA:  Shortness of breath. EXAM: CHEST  2 VIEW COMPARISON:  07/04/2017 FINDINGS: Moderate cardiac enlargement. There is no pleural effusion or edema identified. No airspace opacities identified. The visualized osseous structures are unremarkable. IMPRESSION: No active cardiopulmonary disease. Electronically Signed   By: Kerby Moors M.D.   On: 01/22/2018 11:43    EKG:   Pending  IMPRESSION AND PLAN:   42 year old female with history of chronic diastolic heart failure preserved ejection fraction, right kidney disease stage IV, diabetes and essential hypertension who presents with shortness of breath.  1. Acute on chronic diastolic heart failure with preserved ejection fraction by echocardiogram last year: Start 40 mg IV Lasix every 12 hours Monitor intake and output with daily weight Repeat echocardiogram to evaluate for underlying systolic heart failure [facility] cardio allergy consultation requested CHF clinic upon discharge  2. Uncontrolled hypertension: Patient did not take her morning medications We restarted her medications as she is at increased risk for rebound hypertension from clonidine Monitor blood pressure Add nitroglycerin for now Add when necessary labetalol with parameters  3. Elevated troponin: This is most likely due to demand ischemia Follow telemetry and troponins  4. Chronic kidney disease stage IV: Creatinine is near baseline  Nephrology consultation requested   5. Diabetes: Appears to be diet controlled  Check A1c and start sliding scale    6. Tobacco dependence: Patient is encouraged to quit smoking. Counseling was provided for 4 minutes.  Management plans discussed with the patient and she is is in agreement.  CODE STATUS: full  TOTAL TIME TAKING CARE OF THIS PATIENT: 42 minutes.     POSSIBLE D/C 2-3 days, DEPENDING ON CLINICAL CONDITION.   Renee [name] M.D on 01/22/2018 at 1:19 PM  Between 7am to  6pm - Pager - [phone] After 6pm go to [email or web address] - password [email or web address] [facility]  Office  [phone]  CC: Primary care physician; Minda Ditto, MD  Note: This dictation was prepared with Dragon dictation along with smaller phrase technology. Any transcriptional errors that result from this process are unintentional.;

## 2018-01-23 LAB — CBC
HCT: 36.8 % (ref 35.0–47.0)
HEMOGLOBIN: 11.8 g/dL — AB (ref 12.0–16.0)
MCH: 24.7 pg — ABNORMAL LOW (ref 26.0–34.0)
MCHC: 32.1 g/dL (ref 32.0–36.0)
MCV: 76.9 fL — ABNORMAL LOW (ref 80.0–100.0)
Platelets: 302 10*3/uL (ref 150–440)
RBC: 4.79 MIL/uL (ref 3.80–5.20)
RDW: 16.3 % — ABNORMAL HIGH (ref 11.5–14.5)
WBC: 11 10*3/uL (ref 3.6–11.0)

## 2018-01-23 LAB — ECHOCARDIOGRAM COMPLETE
Height: 62 in
WEIGHTICAEL: 4064 [oz_av]

## 2018-01-23 LAB — GLUCOSE, CAPILLARY
GLUCOSE-CAPILLARY: 273 mg/dL — AB (ref 65–99)
GLUCOSE-CAPILLARY: 241 mg/dL — AB (ref 65–99)
GLUCOSE-CAPILLARY: 161 mg/dL — AB (ref 65–99)
Glucose-Capillary: 145 mg/dL — ABNORMAL HIGH (ref 65–99)

## 2018-01-23 LAB — BASIC METABOLIC PANEL
ANION GAP: 11 (ref 5–15)
BUN: 43 mg/dL — ABNORMAL HIGH (ref 6–20)
CALCIUM: 9.1 mg/dL (ref 8.9–10.3)
CO2: 24 mmol/L (ref 22–32)
Chloride: 102 mmol/L (ref 101–111)
Creatinine, Ser: 3.49 mg/dL — ABNORMAL HIGH (ref 0.44–1.00)
GFR, EST AFRICAN AMERICAN: 18 mL/min — AB (ref >60)
GFR, EST NON AFRICAN AMERICAN: 15 mL/min — AB (ref >60)
Glucose, Bld: 216 mg/dL — ABNORMAL HIGH (ref 65–99)
Potassium: 3.5 mmol/L (ref 3.5–5.1)
SODIUM: 137 mmol/L (ref 135–145)

## 2018-01-23 LAB — HEMOGLOBIN A1C
HEMOGLOBIN A1C: 8.1 % — AB (ref 4.8–5.6)
MEAN PLASMA GLUCOSE: 185.77 mg/dL

## 2018-01-23 LAB — TROPONIN I: Troponin I: 0.11 ng/mL (ref <0.03)

## 2018-01-23 MED ORDER — HYDRALAZINE HCL 50 MG PO TABS
100.0000 mg | ORAL_TABLET | Freq: Four times a day (QID) | ORAL | Status: DC
  Administered 2018-01-23 – 2018-01-25 (×7): 100 mg via ORAL
  Filled 2018-01-23 (×7): qty 2

## 2018-01-23 MED ORDER — PREMIER PROTEIN SHAKE
11.0000 [oz_av] | Freq: Two times a day (BID) | ORAL | Status: DC
  Administered 2018-01-25: 11 [oz_av] via ORAL

## 2018-01-23 MED ORDER — ENOXAPARIN SODIUM 40 MG/0.4ML ~~LOC~~ SOLN
40.0000 mg | SUBCUTANEOUS | Status: DC
  Filled 2018-01-23: qty 0.4

## 2018-01-23 MED ORDER — AMLODIPINE BESYLATE 10 MG PO TABS
10.0000 mg | ORAL_TABLET | Freq: Every day | ORAL | Status: DC
  Administered 2018-01-24 – 2018-01-25 (×2): 10 mg via ORAL
  Filled 2018-01-23 (×2): qty 1

## 2018-01-23 MED ORDER — LIVING WELL WITH DIABETES BOOK
Freq: Once | Status: DC
  Filled 2018-01-23: qty 1

## 2018-01-23 MED ORDER — ISOSORBIDE MONONITRATE ER 60 MG PO TB24
60.0000 mg | ORAL_TABLET | Freq: Every day | ORAL | Status: DC
  Administered 2018-01-23 – 2018-01-25 (×3): 60 mg via ORAL
  Filled 2018-01-23 (×3): qty 1

## 2018-01-23 MED ORDER — GUAIFENESIN-DM 100-10 MG/5ML PO SYRP
5.0000 mL | ORAL_SOLUTION | ORAL | Status: DC | PRN
  Administered 2018-01-23 – 2018-01-24 (×4): 5 mL via ORAL
  Filled 2018-01-23 (×4): qty 5

## 2018-01-23 NOTE — Progress Notes (Signed)
Nutrition Brief Note  Patient identified on the Malnutrition Screening Tool (MST) Report  42 year old female with history of chronic diastolic heart failure preserved ejection fraction, right kidney disease stage IV, diabetes and essential hypertension who presents with shortness of breath.  Wt Readings from Last 15 Encounters:  01/23/18 251 lb 14.4 oz (114.3 kg)  08/13/17 250 lb 8 oz (113.6 kg)  08/08/17 254 lb (115.2 kg)  07/04/17 254 lb (115.2 kg)  06/16/17 250 lb 12.8 oz (113.8 kg)  12/21/16 247 lb 12.8 oz (112.4 kg)  04/08/15 269 lb (122 kg)    Body mass index is 46.07 kg/m. Patient meets criteria for morbid obsiety based on current BMI. Per chart, pt is weight stable.   Current diet order is HH, patient is consuming approximately 100% of meals at this time. Labs and medications reviewed.   RD will order Premier Protein BID to help pt meet her estimated protein needs, each supplement provides 160 kcal and 30 grams of protein.   No further nutrition interventions warranted at this time. If nutrition issues arise, please consult RD.   Koleen Distance MS, RD, LDN Pager #- [phone] After Hours Pager: [phone]

## 2018-01-23 NOTE — Progress Notes (Signed)
Anticoagulation monitoring(Lovenox):  41yo  F ordered Lovenox 40 mg Q12h  Filed Weights   01/22/18 1117 01/23/18 0525  Weight: 254 lb (115.2 kg) 251 lb 14.4 oz (114.3 kg)   BMI 46.06   Lab Results  Component Value Date   CREATININE 3.49 (H) 01/23/2018   CREATININE 2.71 (H) 01/22/2018   CREATININE 2.89 (H) 07/08/2017   Estimated Creatinine Clearance: 25.1 mL/min (A) (by C-G formula based on SCr of 3.49 mg/dL (H)). Hemoglobin & Hematocrit     Component Value Date/Time   HGB 11.8 (L) 01/23/2018 0313   HCT 36.8 01/23/2018 0313     Per Protocol for Patient with estCrcl < 30 ml/min and BMI > 40, will transition back to  Lovenox 40 mg Q24h due to decreased renal fxn in obese patient      [name] PharmD Clinical Pharmacist 01/23/2018

## 2018-01-23 NOTE — Progress Notes (Addendum)
Inpatient Diabetes Program Recommendations  AACE/ADA: New Consensus Statement on Inpatient Glycemic Control (2015)  Target Ranges:  Prepandial:   less than 140 mg/dL      Peak postprandial:   less than 180 mg/dL (1-2 hours)      Critically ill patients:  140 - 180 mg/dL  Results for RAVAN, SCHLEMMER (MRN [id number]) as of 01/23/2018 10:29  Ref. Range 01/22/2018 15:59 01/22/2018 20:35 01/23/2018 07:56  Glucose-Capillary Latest Ref Range: 65 - 99 mg/dL 199 (H) 182 (H) 273 (H)  Results for LOIS, SLAGEL (MRN [id number]) as of 01/23/2018 12:53  Ref. Range 01/22/2018 11:30  Glucose Latest Ref Range: 65 - 99 mg/dL 291 (H)   Results for JULEA, HUTTO (MRN [id number]) as of 01/23/2018 10:29  Ref. Range 06/15/2017 04:02 01/22/2018 15:02  Hemoglobin A1C Latest Ref Range: 4.8 - 5.6 % 7.1 (H) 8.1 (H)   Review of Glycemic Control  Diabetes history: Pre-DM (had Gestation DM 15 years ago) Outpatient Diabetes medications: None  Current orders for Inpatient glycemic control: Novolog 0-20 units QHS  Inpatient Diabetes Program Recommendations: Correction (SSI): Please consider ordering Novolog 0-5 units QHS for bedtime correction scale. HgbA1C: A1C 8.1% on 01/22/18 indicating an average glucose of 186 mg/dl over the past 2-3 months. MD, please indicate in note if patient will be dx with DM2 at this time.  Would recommend patient be discharged on oral DM medication.  NOTE: Noted A1C of 8.1% on 01/22/18. Spoke with patient regarding glycemic control. Had noted in H&P, patient was noted to have DM. However, patient states that she does not have DM but she did have Gestational DM 15 years ago and used insulin for glycemic control. Patient reports that she sees her PCP about every 3 months and her blood work is checked routinely. Patient states that she has been told she was borderline diabetic but she has never been dx with DM2. Discussed current A1C 8.1% on 01/22/18 and informed patient that current A1C indicates an  average glucose of 186 mg/dl. Informed patient that her initial glucose was 291 mg/dl. Discussed ADA criteria for DM dx and explained it would be up to the doctor to dx DM or not. Patient states that she does not follow any type of diabetic diet and she does not restrict her carbohydrates. Discussed basic pathophysiology of diabetes and increased risk of DM2 following GDM. Informed patient it would be noted that she reports no official dx of DM and see if the attending doctor following her at the hospital will dx her with DM at this time or not. Patient feels she will benefit from consult with RD and Living Well with DM book for further education on DM and glycemic control. Informed patient that Diabetes Coordinator will follow up with her tomorrow. Talked with Janett Billow, RN regarding conversation with patient.   Thanks, Barnie Alderman, RN, MSN, CDE Diabetes Coordinator Inpatient Diabetes Program [phone] (Team Pager from 8am to 5pm)

## 2018-01-23 NOTE — Progress Notes (Signed)
[facility] [place] NAME: Alantis Bethune    MR#:  [id number]  DATE OF BIRTH:  Jul 16, 1976  SUBJECTIVE:  CHIEF COMPLAINT:   Chief Complaint  Patient presents with  . Cough  . Chest Pain  feels somewhat better but still SOB. Has cough REVIEW OF SYSTEMS:  Review of Systems  Constitutional: Negative for chills, fever and weight loss.  HENT: Negative for nosebleeds and sore throat.   Eyes: Negative for blurred vision.  Respiratory: Positive for cough and shortness of breath. Negative for wheezing.   Cardiovascular: Negative for chest pain, orthopnea, leg swelling and PND.  Gastrointestinal: Negative for abdominal pain, constipation, diarrhea, heartburn, nausea and vomiting.  Genitourinary: Negative for dysuria and urgency.  Musculoskeletal: Negative for back pain.  Skin: Negative for rash.  Neurological: Negative for dizziness, speech change, focal weakness and headaches.  Endo/Heme/Allergies: Does not bruise/bleed easily.  Psychiatric/Behavioral: Negative for depression.    DRUG ALLERGIES:  No Known Allergies VITALS:  Blood pressure (!) 144/75, pulse 69, temperature 99.3 F (37.4 C), temperature source Oral, resp. rate 18, height 5\' 2"  (1.575 m), weight 114.3 kg (251 lb 14.4 oz), last menstrual period 12/15/2017, SpO2 95 %. PHYSICAL EXAMINATION:  Physical Exam  Constitutional: She is oriented to person, place, and time and well-developed, well-nourished, and in no distress.  HENT:  Head: Normocephalic and atraumatic.  Eyes: Conjunctivae and EOM are normal. Pupils are equal, round, and reactive to light.  Neck: Normal range of motion. Neck supple. No tracheal deviation present. No thyromegaly present.  Cardiovascular: Normal rate, regular rhythm and normal heart sounds.  Pulmonary/Chest: Effort normal and breath sounds normal. No respiratory distress. She has no wheezes. She exhibits no tenderness.  Abdominal: Soft. Bowel sounds are  normal. She exhibits no distension. There is no tenderness.  Musculoskeletal: Normal range of motion.  Neurological: She is alert and oriented to person, place, and time. No cranial nerve deficit.  Skin: Skin is warm and dry. No rash noted.  Psychiatric: Mood and affect normal.   LABORATORY PANEL:  Female CBC Recent Labs  Lab 01/23/18 0313  WBC 11.0  HGB 11.8*  HCT 36.8  PLT 302   ------------------------------------------------------------------------------------------------------------------ Chemistries  Recent Labs  Lab 01/22/18 1130 01/23/18 0313  NA 131* 137  K 3.6 3.5  CL 99* 102  CO2 23 24  GLUCOSE 291* 216*  BUN 35* 43*  CREATININE 2.71* 3.49*  CALCIUM 9.1 9.1  AST 22  --   ALT 17  --   ALKPHOS 80  --   BILITOT 1.0  --    RADIOLOGY:  No results found. ASSESSMENT AND PLAN:  42 year old female with history of chronic diastolic heart failure preserved ejection fraction, right kidney disease stage IV, diabetes and essential hypertension who presents with shortness of breath.  1. Acute on chronic diastolic heart failure with preserved ejection fraction by echocardiogram last year: - continue 40 mg IV Lasix every 12 hours Monitor intake and output with daily weight Repeat echocardiogram shows EF of 50-55% [facility] cardio following CHF clinic upon discharge  2. Uncontrolled hypertension:  - continue amlodipine, coreg, lasix, hydralazine, imdur, losartan  3. Elevated troponin: due to demand ischemia Follow telemetry and troponins  4. Chronic kidney disease stage IV: Creatinine is near baseline  Nephrology consultation appreciated  5. Diabetes: Appears to be diet controlled   6. Cough: Robitussion PRN      All the records are reviewed and case discussed with Care Management/Social Worker.  Management plans discussed with the patient, nursing and they are in agreement.  CODE STATUS: Full Code  TOTAL TIME TAKING CARE OF THIS PATIENT: 35 minutes.   More  than 50% of the time was spent in counseling/coordination of care: YES  POSSIBLE D/C IN 1-2 DAYS, DEPENDING ON CLINICAL CONDITION.   Max Sane M.D on 01/23/2018 at 6:12 PM  Between 7am to 6pm - Pager - [phone]  After 6pm go to [email or web address] - Proofreader  [facility]  Office  [phone]  CC: Primary care physician; Minda Ditto, MD  Note: This dictation was prepared with Dragon dictation along with smaller phrase technology. Any transcriptional errors that result from this process are unintentional.

## 2018-01-23 NOTE — Progress Notes (Signed)
Provided patient with "Living Better with Heart Failure" packet. Briefly reviewed definition of heart failure and signs and symptoms of an exacerbation. Reviewed importance of and reason behind checking weight daily in the AM, after using the bathroom, but before getting dressed. Discussed when to call the Dr= weight gain of >2lb overnight of 5lb in a week,  Discussed yellow zone= call MD: weight gain of >2lb overnight of 5lb in a week, increased swelling, increased SOB when lying down, chest discomfort, dizziness, increased fatigue Red Zone= call 911: struggle to breath, fainting or near fainting, significant chest pain Reviewed low sodium diet <2g/day-provided handout of recommended and not recommended foods  Fluid restriction <2L/day Reviewed how to read nutrition label Reviewed medication changes:adding isosorbide Explained briefly why pt is on the medications (either make you feel better, live longer or keep you out of the hospital) and discussed monitoring and side effects  Discussed tobacco cessation: non-smoker Discussed exercise: encouraged as tolerated  Ulice Dash, PharmD Clinical Pharmacist

## 2018-01-23 NOTE — Progress Notes (Signed)
[facility]  ROUNDING NOTE   Subjective:  Patient well-known to [place].  Next line she presented with shortness of breath and cough. She has known chronic diastolic heart failure. Blood pressure was significantly elevated upon admission. Patient has known underlying chronic kidney disease stage IV with most recent EGFR of 22 with a creatinine of 2.89.   Objective:  Vital signs in last 24 hours:  Temp:  [98.8 F (37.1 C)-99.3 F (37.4 C)] 99.3 F (37.4 C) (01/24 0525) Pulse Rate:  [80-92] 85 (01/24 0747) Resp:  [18] 18 (01/24 0525) BP: (137-159)/(68-82) 159/73 (01/24 0747) SpO2:  [90 %-97 %] 97 % (01/24 0747) Weight:  [114.3 kg (251 lb 14.4 oz)] 114.3 kg (251 lb 14.4 oz) (01/24 0525)  Weight change:  Filed Weights   01/22/18 1117 01/23/18 0525  Weight: 115.2 kg (254 lb) 114.3 kg (251 lb 14.4 oz)    Intake/Output: I/O last 3 completed shifts: In: 120 [P.O.:120] Out: 800 [Urine:800]   Intake/Output this shift:  Total I/O In: 360 [P.O.:360] Out: -   Physical Exam: General: No acute distress  Head: Normocephalic, atraumatic. Moist oral mucosal membranes  Eyes: Anicteric  Neck: Supple, trachea midline  Lungs:  Basilar rales and wheezing, normal effort  Heart: S1S2 no rubs  Abdomen:  Soft, nontender, bowel sounds present  Extremities: 1+ peripheral edema.  Neurologic: Awake, alert, following commands  Skin: No lesions  Access:     Basic Metabolic Panel: Recent Labs  Lab 01/22/18 1130 01/23/18 0313  NA 131* 137  K 3.6 3.5  CL 99* 102  CO2 23 24  GLUCOSE 291* 216*  BUN 35* 43*  CREATININE 2.71* 3.49*  CALCIUM 9.1 9.1    Liver Function Tests: Recent Labs  Lab 01/22/18 1130  AST 22  ALT 17  ALKPHOS 80  BILITOT 1.0  PROT 8.0  ALBUMIN 3.4*   No results for input(s): LIPASE, AMYLASE in the last 168 hours. No results for input(s): AMMONIA in the last 168 hours.  CBC: Recent Labs  Lab 01/22/18 1130 01/23/18 0313  WBC 12.2* 11.0  HGB  13.1 11.8*  HCT 40.3 36.8  MCV 76.0* 76.9*  PLT 282 302    Cardiac Enzymes: Recent Labs  Lab 01/22/18 1130 01/22/18 1502 01/22/18 2026 01/23/18 0313  TROPONINI 0.06* 0.06* 0.07* 0.11*    BNP: Invalid input(s): POCBNP  CBG: Recent Labs  Lab 01/22/18 1559 01/22/18 2035 01/23/18 0756 01/23/18 1151  GLUCAP 199* 182* 273* 241*    Microbiology: Results for orders placed or performed during the hospital encounter of 07/04/17  Blood Culture (routine x 2)     Status: None   Collection Time: 07/05/17 12:11 AM  Result Value Ref Range Status   Specimen Description BLOOD RAC  Final   Special Requests   Final    BOTTLES DRAWN AEROBIC AND ANAEROBIC Blood Culture adequate volume   Culture NO GROWTH 5 DAYS  Final   Report Status 07/10/2017 FINAL  Final  Blood Culture (routine x 2)     Status: None   Collection Time: 07/05/17 12:11 AM  Result Value Ref Range Status   Specimen Description BLOOD RAC  Final   Special Requests   Final    BOTTLES DRAWN AEROBIC AND ANAEROBIC Blood Culture adequate volume   Culture NO GROWTH 5 DAYS  Final   Report Status 07/10/2017 FINAL  Final  Urine culture     Status: Abnormal   Collection Time: 07/05/17 12:11 AM  Result Value Ref Range Status  Specimen Description URINE, RANDOM  Final   Special Requests NONE  Final   Culture MULTIPLE ORGANISMS PRESENT, NONE PREDOMINANT (A)  Final   Report Status 07/06/2017 FINAL  Final  MRSA PCR Screening     Status: None   Collection Time: 07/05/17  5:56 AM  Result Value Ref Range Status   MRSA by PCR NEGATIVE NEGATIVE Final    Comment:        The GeneXpert MRSA Assay (FDA approved for NASAL specimens only), is one component of a comprehensive MRSA colonization surveillance program. It is not intended to diagnose MRSA infection nor to guide or monitor treatment for MRSA infections.     Coagulation Studies: No results for input(s): LABPROT, INR in the last 72 hours.  Urinalysis: No results for  input(s): COLORURINE, LABSPEC, PHURINE, GLUCOSEU, HGBUR, BILIRUBINUR, KETONESUR, PROTEINUR, UROBILINOGEN, NITRITE, LEUKOCYTESUR in the last 72 hours.  Invalid input(s): APPERANCEUR    Imaging: Dg Chest 2 View  Result Date: 01/22/2018 CLINICAL DATA:  Shortness of breath. EXAM: CHEST  2 VIEW COMPARISON:  07/04/2017 FINDINGS: Moderate cardiac enlargement. There is no pleural effusion or edema identified. No airspace opacities identified. The visualized osseous structures are unremarkable. IMPRESSION: No active cardiopulmonary disease. Electronically Signed   By: Kerby Moors M.D.   On: 01/22/2018 11:43     Medications:   . sodium chloride     . [START ON 01/24/2018] amLODipine  10 mg Oral Daily  . carvedilol  25 mg Oral BID WC  . enoxaparin (LOVENOX) injection  40 mg Subcutaneous Q24H  . furosemide  40 mg Intravenous BID  . hydrALAZINE  100 mg Oral Q6H  . insulin aspart  0-20 Units Subcutaneous TID WC  . isosorbide mononitrate  60 mg Oral Daily  . living well with diabetes book   Does not apply Once  . losartan  100 mg Oral Daily  . protein supplement shake  11 oz Oral BID BM  . sodium chloride flush  3 mL Intravenous Q12H   sodium chloride, acetaminophen **OR** acetaminophen, bisacodyl, guaiFENesin-dextromethorphan, HYDROcodone-acetaminophen, labetalol, ondansetron **OR** ondansetron (ZOFRAN) IV, polyethylene glycol, sodium chloride flush  Assessment/ Plan:  42 y.o. female with hypertension, diabetes mellitus type II, morbid obesity, anemia who presents now with shorthess of breath.   1.  Acute renal failure/chronic kidney disease stage IV wwith baseline creatinine 2.8. Patient has worsening renal function now.  Creatinine up to 3.49.  Check renal ultrasound to make sure there is no underlying obstruction.  Suspect acute renal failure is due to cardiorenal syndrome.  Continue with Lasix for now and follow renal function daily.  2.  Acute on chronic diastolic heart failure.  Agree  with use of Lasix 40 mg IV twice a day for now.  3.  Hypertension.  Continue hydralazine, losartan,carvedilol, and amlodipine.  4.  Thanks for consultation.   LOS: 1 Elke Holtry 1/24/20194:27 PM

## 2018-01-24 ENCOUNTER — Inpatient Hospital Stay: Payer: Medicare Other

## 2018-01-24 LAB — BASIC METABOLIC PANEL
ANION GAP: 12 (ref 5–15)
BUN: 57 mg/dL — ABNORMAL HIGH (ref 6–20)
CHLORIDE: 99 mmol/L — AB (ref 101–111)
CO2: 21 mmol/L — ABNORMAL LOW (ref 22–32)
Calcium: 8.8 mg/dL — ABNORMAL LOW (ref 8.9–10.3)
Creatinine, Ser: 3.81 mg/dL — ABNORMAL HIGH (ref 0.44–1.00)
GFR, EST AFRICAN AMERICAN: 16 mL/min — AB (ref >60)
GFR, EST NON AFRICAN AMERICAN: 14 mL/min — AB (ref >60)
Glucose, Bld: 222 mg/dL — ABNORMAL HIGH (ref 65–99)
POTASSIUM: 3.4 mmol/L — AB (ref 3.5–5.1)
SODIUM: 132 mmol/L — AB (ref 135–145)

## 2018-01-24 LAB — GLUCOSE, CAPILLARY
GLUCOSE-CAPILLARY: 224 mg/dL — AB (ref 65–99)
GLUCOSE-CAPILLARY: 182 mg/dL — AB (ref 65–99)
Glucose-Capillary: 235 mg/dL — ABNORMAL HIGH (ref 65–99)
Glucose-Capillary: 232 mg/dL — ABNORMAL HIGH (ref 65–99)

## 2018-01-24 LAB — TROPONIN I
TROPONIN I: 0.09 ng/mL — AB (ref <0.03)
TROPONIN I: 0.07 ng/mL — AB (ref <0.03)
Troponin I: 0.07 ng/mL (ref <0.03)

## 2018-01-24 MED ORDER — GUAIFENESIN ER 600 MG PO TB12
600.0000 mg | ORAL_TABLET | Freq: Two times a day (BID) | ORAL | Status: DC
  Administered 2018-01-24 – 2018-01-25 (×3): 600 mg via ORAL
  Filled 2018-01-24 (×3): qty 1

## 2018-01-24 MED ORDER — CLONIDINE HCL 0.1 MG PO TABS
0.2000 mg | ORAL_TABLET | Freq: Two times a day (BID) | ORAL | Status: DC
  Administered 2018-01-24 – 2018-01-25 (×2): 0.2 mg via ORAL
  Filled 2018-01-24 (×2): qty 2

## 2018-01-24 MED ORDER — LIVING WELL WITH DIABETES BOOK
Freq: Once | Status: AC
  Administered 2018-01-24: 12:00:00
  Filled 2018-01-24: qty 1

## 2018-01-24 MED ORDER — FUROSEMIDE 40 MG PO TABS
40.0000 mg | ORAL_TABLET | Freq: Every day | ORAL | Status: DC
  Administered 2018-01-24 – 2018-01-25 (×2): 40 mg via ORAL
  Filled 2018-01-24 (×2): qty 1

## 2018-01-24 MED ORDER — POLYETHYLENE GLYCOL 3350 17 G PO PACK
17.0000 g | PACK | Freq: Every day | ORAL | Status: DC
  Administered 2018-01-24: 17 g via ORAL
  Filled 2018-01-24 (×2): qty 1

## 2018-01-24 MED ORDER — INSULIN GLARGINE 100 UNIT/ML ~~LOC~~ SOLN
10.0000 [IU] | Freq: Every day | SUBCUTANEOUS | Status: DC
  Administered 2018-01-24: 10 [IU] via SUBCUTANEOUS
  Filled 2018-01-24 (×2): qty 0.1

## 2018-01-24 MED ORDER — POTASSIUM CHLORIDE CRYS ER 20 MEQ PO TBCR
40.0000 meq | EXTENDED_RELEASE_TABLET | Freq: Once | ORAL | Status: AC
  Administered 2018-01-24: 40 meq via ORAL
  Filled 2018-01-24: qty 2

## 2018-01-24 NOTE — Progress Notes (Signed)
[facility] [place] NAME: Renee [name]    MR#:  [id number]  DATE OF BIRTH:  1976/08/17  SUBJECTIVE:  CHIEF COMPLAINT:   Chief Complaint  Patient presents with  . Cough  . Chest Pain  has dry cough - unable to expectorate,, creat worse REVIEW OF SYSTEMS:  Review of Systems  Constitutional: Negative for chills, fever and weight loss.  HENT: Negative for nosebleeds and sore throat.   Eyes: Negative for blurred vision.  Respiratory: Positive for cough and shortness of breath. Negative for wheezing.   Cardiovascular: Negative for chest pain, orthopnea, leg swelling and PND.  Gastrointestinal: Negative for abdominal pain, constipation, diarrhea, heartburn, nausea and vomiting.  Genitourinary: Negative for dysuria and urgency.  Musculoskeletal: Negative for back pain.  Skin: Negative for rash.  Neurological: Negative for dizziness, speech change, focal weakness and headaches.  Endo/Heme/Allergies: Does not bruise/bleed easily.  Psychiatric/Behavioral: Negative for depression.    DRUG ALLERGIES:  No Known Allergies VITALS:  Blood pressure (!) 156/78, pulse 74, temperature (!) 97.4 F (36.3 C), temperature source Oral, resp. rate 16, height 5\' 2"  (1.575 m), weight 115 kg (253 lb 8 oz), last menstrual period 12/15/2017, SpO2 94 %. PHYSICAL EXAMINATION:  Physical Exam  Constitutional: She is oriented to person, place, and time and well-developed, well-nourished, and in no distress.  HENT:  Head: Normocephalic and atraumatic.  Eyes: Conjunctivae and EOM are normal. Pupils are equal, round, and reactive to light.  Neck: Normal range of motion. Neck supple. No tracheal deviation present. No thyromegaly present.  Cardiovascular: Normal rate, regular rhythm and normal heart sounds.  Pulmonary/Chest: Effort normal and breath sounds normal. No respiratory distress. She has no wheezes. She exhibits no tenderness.  Abdominal: Soft. Bowel sounds  are normal. She exhibits no distension. There is no tenderness.  Musculoskeletal: Normal range of motion.  Neurological: She is alert and oriented to person, place, and time. No cranial nerve deficit.  Skin: Skin is warm and dry. No rash noted.  Psychiatric: Mood and affect normal.   LABORATORY PANEL:  Female CBC Recent Labs  Lab 01/23/18 0313  WBC 11.0  HGB 11.8*  HCT 36.8  PLT 302   ------------------------------------------------------------------------------------------------------------------ Chemistries  Recent Labs  Lab 01/22/18 1130  01/24/18 0738  NA 131*   < > 132*  K 3.6   < > 3.4*  CL 99*   < > 99*  CO2 23   < > 21*  GLUCOSE 291*   < > 222*  BUN 35*   < > 57*  CREATININE 2.71*   < > 3.81*  CALCIUM 9.1   < > 8.8*  AST 22  --   --   ALT 17  --   --   ALKPHOS 80  --   --   BILITOT 1.0  --   --    < > = values in this interval not displayed.   RADIOLOGY:  No results found. ASSESSMENT AND PLAN:  42 year old female with history of chronic diastolic heart failure preserved ejection fraction, right kidney disease stage IV, diabetes and essential hypertension who presents with shortness of breath.  1. Acute on chronic diastolic heart failure with preserved ejection fraction by echocardiogram last year: - continue Lasix Per nephro, may want to hold if Nephro ok with worsening renal failure Monitor intake and output with daily weight Repeat echocardiogram shows EF of 50-55% [facility] cardio following CHF clinic upon discharge  2. Uncontrolled hypertension:  -  continue amlodipine, coreg, lasix, hydralazine, imdur, losartan  3. Elevated troponin: due to demand ischemia Follow telemetry  4. Acute on Chronic kidney disease stage IV: Creatinine is worsening. 2.71->3.81 - I've ordered renal ultrasound to make sure there is no underlying obstruction.   - Likely due to cardiorenal syndrome.    - Nephrology following  5. Diabetes: HbA1c 8.1 - new diagnosis this  admission - start Lantus, continue SSI - may need oral agent at D/C - DM nurse following  6. Cough: Robitussion PRN, add mucinex as it's dry   7. Euthyroid Sick: elevated TSH but normal free t3,t4  8. Hypokalemia: replete and recheck    All the records are reviewed and case discussed with Care Management/Social Worker. Management plans discussed with the patient, nursing, Cardio and they are in agreement.  CODE STATUS: Full Code  TOTAL TIME TAKING CARE OF THIS PATIENT: 35 minutes.   More than 50% of the time was spent in counseling/coordination of care: YES  POSSIBLE D/C IN 1-2 DAYS, DEPENDING ON CLINICAL CONDITION. And Nephro eval   Max Sane M.D on 01/24/2018 at 10:40 AM  Between 7am to 6pm - Pager - [phone]  After 6pm go to [email or web address] - Proofreader  [facility]  Office  [phone]  CC: Primary care physician; Minda Ditto, MD  Note: This dictation was prepared with Dragon dictation along with smaller phrase technology. Any transcriptional errors that result from this process are unintentional.

## 2018-01-24 NOTE — Progress Notes (Signed)
Nutrition Education Note   RD consulted for nutrition education regarding diabetes.   42 year old female with history of chronic diastolic heart failure preserved ejection fraction, right kidney disease stage IV, diabetes and essential hypertension who presents with shortness of breath.  Lab Results  Component Value Date   HGBA1C 8.1 (H) 01/22/2018    RD provided "Nutrition and Type II Diabetes" handout from the Academy of Nutrition and Dietetics. Discussed different food groups and their effects on blood sugar, emphasizing carbohydrate-containing foods. Provided list of carbohydrates and recommended serving sizes of common foods.  Discussed importance of controlled and consistent carbohydrate intake throughout the day. Provided examples of ways to balance meals/snacks and encouraged intake of high-fiber, whole grain complex carbohydrates. Teach back method used.  Expect good compliance.  Body mass index is 46.37 kg/m. Pt meets criteria for morbid obesity based on current BMI.  Current diet order is HH/CHO, patient is consuming approximately 100% of meals at this time. Labs and medications reviewed. No further nutrition interventions warranted at this time. RD contact information provided. If additional nutrition issues arise, please re-consult RD.  Koleen Distance MS, RD, LDN Pager #- [phone] After Hours Pager: [phone]

## 2018-01-24 NOTE — Plan of Care (Signed)
  Progressing Clinical Measurements: Respiratory complications will improve 01/24/2018 0117 - Progressing by Jeri Cos, RN Pain Managment: General experience of comfort will improve 01/24/2018 0117 - Progressing by Jeri Cos, RN

## 2018-01-24 NOTE — Progress Notes (Signed)
Inpatient Diabetes Program Recommendations  AACE/ADA: New Consensus Statement on Inpatient Glycemic Control (2015)  Target Ranges:  Prepandial:   less than 140 mg/dL      Peak postprandial:   less than 180 mg/dL (1-2 hours)      Critically ill patients:  140 - 180 mg/dL   Lab Results  Component Value Date   GLUCAP 224 (H) 01/24/2018   HGBA1C 8.1 (H) 01/22/2018    Review of Glycemic Control  Results for DARIENNE, BELLEAU (MRN [id number]) as of 01/24/2018 10:24  Ref. Range 01/23/2018 07:56 01/23/2018 11:51 01/23/2018 17:08 01/23/2018 21:14 01/24/2018 07:40  Glucose-Capillary Latest Ref Range: 65 - 99 mg/dL 273 (H) 241 (H) 145 (H) 161 (H) 224 (H)   Diabetes history: New diagnosis this visit Outpatient Diabetes medications: None   Current orders for Inpatient glycemic control: Novolog 0-20 units QHS  Inpatient Diabetes Program Recommendations: Please consider ordering Novolog 0-5 units QHS for bedtime correction scale.   Consider starting low dose basal insulin while inpatient- Lantus 10 units qhs (fasting blood sugar elevated)  Referred for outpatient diabetes education.  Would recommend patient be discharged on oral DM medication.  Gentry Fitz, RN, BA, MHA, CDE Diabetes Coordinator Inpatient Diabetes Program  [phone] (Team Pager) [phone] ([place]) 01/24/2018 10:31 AM

## 2018-01-24 NOTE — Progress Notes (Signed)
BP 166/87. Started on Catapres 0.2 mg

## 2018-01-24 NOTE — Progress Notes (Signed)
Report given to Jancy, RN.  

## 2018-01-24 NOTE — Care Management Important Message (Signed)
Important Message  Patient Details  Name: Renee [name] MRN: [id number] Date of Birth: Apr 10, 1976   Medicare Important Message Given:  Yes    Beverly Sessions, RN 01/24/2018, 4:55 PM

## 2018-01-24 NOTE — Progress Notes (Signed)
[facility]  ROUNDING NOTE   Subjective:  Patient seen at bedside. Doing much better as compared to yesterday. Still slightly short of breath.   Objective:  Vital signs in last 24 hours:  Temp:  [97.4 F (36.3 C)-97.8 F (36.6 C)] 97.4 F (36.3 C) (01/25 0738) Pulse Rate:  [67-75] 67 (01/25 1229) Resp:  [16-19] 16 (01/25 0738) BP: (144-172)/(66-87) 172/87 (01/25 1229) SpO2:  [90 %-96 %] 94 % (01/25 0738) Weight:  [115 kg (253 lb 8 oz)] 115 kg (253 lb 8 oz) (01/25 0402)  Weight change: -0.227 kg (-8 oz) Filed Weights   01/22/18 1117 01/23/18 0525 01/24/18 0402  Weight: 115.2 kg (254 lb) 114.3 kg (251 lb 14.4 oz) 115 kg (253 lb 8 oz)    Intake/Output: I/O last 3 completed shifts: In: 382 [P.O.:957] Out: 800 [Urine:800]   Intake/Output this shift:  Total I/O In: 243 [P.O.:240; I.V.:3] Out: -   Physical Exam: General: No acute distress  Head: Normocephalic, atraumatic. Moist oral mucosal membranes  Eyes: Anicteric  Neck: Supple, trachea midline  Lungs:  Basilar rales and wheezing, normal effort  Heart: S1S2 no rubs  Abdomen:  Soft, nontender, bowel sounds present  Extremities: 1+ peripheral edema.  Neurologic: Awake, alert, following commands  Skin: No lesions  Access:     Basic Metabolic Panel: Recent Labs  Lab 01/22/18 1130 01/23/18 0313 01/24/18 0738  NA 131* 137 132*  K 3.6 3.5 3.4*  CL 99* 102 99*  CO2 23 24 21*  GLUCOSE 291* 216* 222*  BUN 35* 43* 57*  CREATININE 2.71* 3.49* 3.81*  CALCIUM 9.1 9.1 8.8*    Liver Function Tests: Recent Labs  Lab 01/22/18 1130  AST 22  ALT 17  ALKPHOS 80  BILITOT 1.0  PROT 8.0  ALBUMIN 3.4*   No results for input(s): LIPASE, AMYLASE in the last 168 hours. No results for input(s): AMMONIA in the last 168 hours.  CBC: Recent Labs  Lab 01/22/18 1130 01/23/18 0313  WBC 12.2* 11.0  HGB 13.1 11.8*  HCT 40.3 36.8  MCV 76.0* 76.9*  PLT 282 302    Cardiac Enzymes: Recent Labs  Lab  01/22/18 1130 01/22/18 1502 01/22/18 2026 01/23/18 0313 01/24/18 0738  TROPONINI 0.06* 0.06* 0.07* 0.11* 0.09*    BNP: Invalid input(s): POCBNP  CBG: Recent Labs  Lab 01/23/18 1151 01/23/18 1708 01/23/18 2114 01/24/18 0740 01/24/18 1217  GLUCAP 241* 145* 161* 224* 182*    Microbiology: Results for orders placed or performed during the hospital encounter of 07/04/17  Blood Culture (routine x 2)     Status: None   Collection Time: 07/05/17 12:11 AM  Result Value Ref Range Status   Specimen Description BLOOD RAC  Final   Special Requests   Final    BOTTLES DRAWN AEROBIC AND ANAEROBIC Blood Culture adequate volume   Culture NO GROWTH 5 DAYS  Final   Report Status 07/10/2017 FINAL  Final  Blood Culture (routine x 2)     Status: None   Collection Time: 07/05/17 12:11 AM  Result Value Ref Range Status   Specimen Description BLOOD RAC  Final   Special Requests   Final    BOTTLES DRAWN AEROBIC AND ANAEROBIC Blood Culture adequate volume   Culture NO GROWTH 5 DAYS  Final   Report Status 07/10/2017 FINAL  Final  Urine culture     Status: Abnormal   Collection Time: 07/05/17 12:11 AM  Result Value Ref Range Status   Specimen Description URINE,  RANDOM  Final   Special Requests NONE  Final   Culture MULTIPLE ORGANISMS PRESENT, NONE PREDOMINANT (A)  Final   Report Status 07/06/2017 FINAL  Final  MRSA PCR Screening     Status: None   Collection Time: 07/05/17  5:56 AM  Result Value Ref Range Status   MRSA by PCR NEGATIVE NEGATIVE Final    Comment:        The GeneXpert MRSA Assay (FDA approved for NASAL specimens only), is one component of a comprehensive MRSA colonization surveillance program. It is not intended to diagnose MRSA infection nor to guide or monitor treatment for MRSA infections.     Coagulation Studies: No results for input(s): LABPROT, INR in the last 72 hours.  Urinalysis: No results for input(s): COLORURINE, LABSPEC, PHURINE, GLUCOSEU, HGBUR,  BILIRUBINUR, KETONESUR, PROTEINUR, UROBILINOGEN, NITRITE, LEUKOCYTESUR in the last 72 hours.  Invalid input(s): APPERANCEUR    Imaging: US Renal  Result Date: 01/24/2018 CLINICAL DATA:  42 year old female with acute renal failure. EXAM: RENAL / URINARY TRACT ULTRASOUND COMPLETE COMPARISON:  07/05/2017 and prior ultrasounds FINDINGS: Right Kidney: Length: 9.6 cm. Renal echogenicity is slightly increased. No solid mass, hydronephrosis or other abnormality. Left Kidney: Length: 9.9 cm. Renal echogenicity is slightly increased. No solid mass, hydronephrosis or other abnormality. Bladder: Appears normal for degree of bladder distention. IMPRESSION: 1. Increased bilateral renal echogenicity compatible with medical renal disease. No evidence of hydronephrosis. Electronically Signed   By: Margarette Canada M.D.   On: 01/24/2018 12:15     Medications:   . sodium chloride     . amLODipine  10 mg Oral Daily  . carvedilol  25 mg Oral BID WC  . enoxaparin (LOVENOX) injection  40 mg Subcutaneous Q24H  . furosemide  40 mg Intravenous BID  . guaiFENesin  600 mg Oral BID  . hydrALAZINE  100 mg Oral Q6H  . insulin aspart  0-20 Units Subcutaneous TID WC  . insulin glargine  10 Units Subcutaneous QHS  . isosorbide mononitrate  60 mg Oral Daily  . living well with diabetes book   Does not apply Once  . losartan  100 mg Oral Daily  . polyethylene glycol  17 g Oral Daily  . protein supplement shake  11 oz Oral BID BM  . sodium chloride flush  3 mL Intravenous Q12H   sodium chloride, acetaminophen **OR** acetaminophen, bisacodyl, guaiFENesin-dextromethorphan, HYDROcodone-acetaminophen, labetalol, ondansetron **OR** ondansetron (ZOFRAN) IV, sodium chloride flush  Assessment/ Plan:  42 y.o. female with hypertension, diabetes mellitus type II, morbid obesity, anemia who presents now with shorthess of breath.   1.  Acute renal failure/chronic kidney disease stage IV wwith baseline creatinine 2.8.  Patient seen at  bedside.  Her status has improved significantly. However her renal function is worse than her baseline with a creatinine of 3.8 and EGFR down to 16.  We will need to continue to monitor very closely however.  Renal US negative for hydronephrosis.  2.  Acute on chronic diastolic heart failure.  Recommend decreasing Lasix to 40 mg p.o. Daily.  3.  Hypertension.  Continue hydralazine, losartan,carvedilol, and amlodipine.  Blood pressure still high at 172/87.     LOS: 2 Verita Kuroda 1/25/201912:57 PM

## 2018-01-25 LAB — CBC
HCT: 37.6 % (ref 35.0–47.0)
Hemoglobin: 12.2 g/dL (ref 12.0–16.0)
MCH: 24.9 pg — ABNORMAL LOW (ref 26.0–34.0)
MCHC: 32.3 g/dL (ref 32.0–36.0)
MCV: 77.1 fL — ABNORMAL LOW (ref 80.0–100.0)
PLATELETS: 288 10*3/uL (ref 150–440)
RBC: 4.88 MIL/uL (ref 3.80–5.20)
RDW: 16.1 % — AB (ref 11.5–14.5)
WBC: 8.5 10*3/uL (ref 3.6–11.0)

## 2018-01-25 LAB — BASIC METABOLIC PANEL
ANION GAP: 10 (ref 5–15)
BUN: 60 mg/dL — ABNORMAL HIGH (ref 6–20)
CALCIUM: 8.7 mg/dL — AB (ref 8.9–10.3)
CO2: 23 mmol/L (ref 22–32)
Chloride: 103 mmol/L (ref 101–111)
Creatinine, Ser: 3.48 mg/dL — ABNORMAL HIGH (ref 0.44–1.00)
GFR calc non Af Amer: 15 mL/min — ABNORMAL LOW (ref >60)
GFR, EST AFRICAN AMERICAN: 18 mL/min — AB (ref >60)
GLUCOSE: 170 mg/dL — AB (ref 65–99)
Potassium: 3.4 mmol/L — ABNORMAL LOW (ref 3.5–5.1)
Sodium: 136 mmol/L (ref 135–145)

## 2018-01-25 LAB — GLUCOSE, CAPILLARY: GLUCOSE-CAPILLARY: 175 mg/dL — AB (ref 65–99)

## 2018-01-25 MED ORDER — FUROSEMIDE 40 MG PO TABS: 40.0000 mg | ORAL_TABLET | Freq: Every day | ORAL | Status: DC

## 2018-01-25 MED ORDER — GUAIFENESIN-DM 100-10 MG/5ML PO SYRP: 5.0000 mL | ORAL_SOLUTION | ORAL | 0 refills | Status: DC | PRN

## 2018-01-25 MED ORDER — GUAIFENESIN ER 600 MG PO TB12: 600.0000 mg | ORAL_TABLET | Freq: Two times a day (BID) | ORAL | 0 refills | Status: AC

## 2018-01-25 MED ORDER — FLUTICASONE PROPIONATE 50 MCG/ACT NA SUSP: 1.0000 | Freq: Every day | NASAL | 2 refills | Status: DC

## 2018-01-25 MED ORDER — ALBUTEROL SULFATE HFA 108 (90 BASE) MCG/ACT IN AERS: 2.0000 | INHALATION_SPRAY | Freq: Four times a day (QID) | RESPIRATORY_TRACT | 2 refills | Status: DC | PRN

## 2018-01-25 MED ORDER — ISOSORBIDE MONONITRATE ER 30 MG PO TB24: 30.0000 mg | ORAL_TABLET | Freq: Every day | ORAL | 11 refills | Status: DC

## 2018-01-25 MED ORDER — HYDRALAZINE HCL 100 MG PO TABS: 100.0000 mg | ORAL_TABLET | Freq: Four times a day (QID) | ORAL | 11 refills | Status: DC

## 2018-01-25 NOTE — Discharge Summary (Signed)
[place] at [facility], [place] y.o., DOB [birth year], MRN [id number]. Admission date: 01/22/2018 Discharge Date 01/25/2018 Primary MD Minda Ditto, MD Admitting Physician Bettey Costa, MD  Admission Diagnosis  Dyspnea, unspecified type [R06.00] Acute on chronic congestive heart failure, unspecified heart failure type [facility]) [I50.9]  Discharge Diagnosis   Active Problems: Acute on chronic diastolic CHF Accelerated hypertension Acute renal failure on chronic kidney disease stage IV  elevated troponin due to demand ischemia Diabetes type 2 Acute bronchitis      Hospital Course  42 year old female with history of chronic diastolic heart failure preserved ejection fraction, right kidney disease stage IV, diabetes and essential hypertension who presents with shortness of breath.  Patient was noted to have acute on chronic diastolic CHF.  She was treated with Lasix.  Her symptoms started to improve but she continued to have cough and congestion.  Patient was treated with antitussive medications.  Patient also was followed by nephrology for her chronic kidney disease.  Her renal function is currently close to baseline.  Patient also noticed to have a hemoglobin A1c of 8.2 and was started on insulin.  On discharge patient wanted to try diet and exercise prior to starting any insulin.  At this point this will need to be closely monitored.  I have written her prescription for glucometer.                 Consults  nephrology  Significant Tests:  See full reports for all details     Dg Chest 2 View  Result Date: 01/22/2018 CLINICAL DATA:  Shortness of breath. EXAM: CHEST  2 VIEW COMPARISON:  07/04/2017 FINDINGS: Moderate cardiac enlargement. There is no pleural effusion or edema identified. No airspace opacities identified. The visualized osseous structures are unremarkable. IMPRESSION: No active cardiopulmonary disease. Electronically Signed    By: Kerby Moors M.D.   On: 01/22/2018 11:43   US Renal  Result Date: 01/24/2018 CLINICAL DATA:  42 year old female with acute renal failure. EXAM: RENAL / URINARY TRACT ULTRASOUND COMPLETE COMPARISON:  07/05/2017 and prior ultrasounds FINDINGS: Right Kidney: Length: 9.6 cm. Renal echogenicity is slightly increased. No solid mass, hydronephrosis or other abnormality. Left Kidney: Length: 9.9 cm. Renal echogenicity is slightly increased. No solid mass, hydronephrosis or other abnormality. Bladder: Appears normal for degree of bladder distention. IMPRESSION: 1. Increased bilateral renal echogenicity compatible with medical renal disease. No evidence of hydronephrosis. Electronically Signed   By: Margarette Canada M.D.   On: 01/24/2018 12:15       Today   Subjective:   Renee [name] patient's breathing much improved o Objective:   Blood pressure (!) 177/85, pulse 75, temperature (!) 97.2 F (36.2 C), temperature source Oral, resp. rate 18, height 5\' 2"  (1.575 m), weight 251 lb (113.9 kg), last menstrual period 12/15/2017, SpO2 99 %.  .  Intake/Output Summary (Last 24 hours) at 01/25/2018 1540 Last data filed at 01/25/2018 1015 Gross per 24 hour  Intake 360 ml  Output -  Net 360 ml    Exam VITAL SIGNS: Blood pressure (!) 177/85, pulse 75, temperature (!) 97.2 F (36.2 C), temperature source Oral, resp. rate 18, height 5\' 2"  (1.575 m), weight 251 lb (113.9 kg), last menstrual period 12/15/2017, SpO2 99 %.  GENERAL:  42 y.o.-year-old patient lying in the bed with no acute distress.  EYES: Pupils equal, round, reactive to light and accommodation. No scleral icterus. Extraocular muscles intact.  HEENT: Head atraumatic, normocephalic. Oropharynx and nasopharynx  clear.  NECK:  Supple, no jugular venous distention. No thyroid enlargement, no tenderness.  LUNGS: Normal breath sounds bilaterally, no wheezing, rales,rhonchi or crepitation. No use of accessory muscles of respiration.   CARDIOVASCULAR: S1, S2 normal. No murmurs, rubs, or gallops.  ABDOMEN: Soft, nontender, nondistended. Bowel sounds present. No organomegaly or mass.  EXTREMITIES: No pedal edema, cyanosis, or clubbing.  NEUROLOGIC: Cranial nerves II through XII are intact. Muscle strength 5/5 in all extremities. Sensation intact. Gait not checked.  PSYCHIATRIC: The patient is alert and oriented x 3.  SKIN: No obvious rash, lesion, or ulcer.   Data Review     CBC w Diff:  Lab Results  Component Value Date   WBC 8.5 01/25/2018   HGB 12.2 01/25/2018   HCT 37.6 01/25/2018   PLT 288 01/25/2018   LYMPHOPCT 16 07/05/2017   MONOPCT 4 07/05/2017   EOSPCT 3 07/05/2017   BASOPCT 1 07/05/2017   CMP:  Lab Results  Component Value Date   NA 136 01/25/2018   K 3.4 (L) 01/25/2018   CL 103 01/25/2018   CO2 23 01/25/2018   BUN 60 (H) 01/25/2018   BUN 30 (A) 04/11/2015   CREATININE 3.48 (H) 01/25/2018   PROT 8.0 01/22/2018   ALBUMIN 3.4 (L) 01/22/2018   BILITOT 1.0 01/22/2018   ALKPHOS 80 01/22/2018   AST 22 01/22/2018   ALT 17 01/22/2018  .  Micro Results No results found for this or any previous visit (from the past 240 hour(s)).   Code Status History    Date Active Date Inactive Code Status Order ID Comments User Context   01/22/2018 14:29 01/25/2018 15:25 Full Code [id number]  Bettey Costa, MD Inpatient   07/05/2017 04:29 07/09/2017 16:49 Full Code [id number]  Harvie Bridge, DO ED   06/15/2017 00:38 06/17/2017 13:14 Full Code [id number]  Theodoro Grist, MD Inpatient   12/20/2016 12:21 12/21/2016 17:53 Full Code [id number]  Bettey Costa, MD ED          Follow-up Information    Lavonia Dana, MD Follow up in 5 day(s).   Specialty:  Internal Medicine Why:  f/u arf, ckd Contact information: [address] [place] [address] [phone]        Minda Ditto, MD Follow up in 1 week(s).   Specialty:  Family Medicine Why:  hospital f/u Contact information: [place] [address] [phone]           Discharge Medications   Allergies as of 01/25/2018   No Known Allergies     Medication List    TAKE these medications   albuterol 108 (90 Base) MCG/ACT inhaler Commonly known as:  PROVENTIL HFA;VENTOLIN HFA Inhale 2 puffs into the lungs every 6 (six) hours as needed for wheezing or shortness of breath.   carvedilol 25 MG tablet Commonly known as:  COREG Take 1 tablet (25 mg total) by mouth 2 (two) times daily with a meal.   cloNIDine 0.2 MG tablet Commonly known as:  CATAPRES Take 1 tablet (0.2 mg total) by mouth 3 (three) times daily.   fluticasone 50 MCG/ACT nasal spray Commonly known as:  FLONASE Place 1 spray into both nostrils daily.   furosemide 40 MG tablet Commonly known as:  LASIX Take 1 tablet (40 mg total) by mouth daily.   guaiFENesin 600 MG 12 hr tablet Commonly known as:  MUCINEX Take 1 tablet (600 mg total) by mouth 2 (two) times daily for  7 days.   guaiFENesin-dextromethorphan 100-10 MG/5ML syrup Commonly known as:  ROBITUSSIN DM Take 5 mLs by mouth every 4 (four) hours as needed for cough.   hydrALAZINE 100 MG tablet Commonly known as:  APRESOLINE Take 1 tablet (100 mg total) by mouth 4 (four) times daily. What changed:  when to take this   isosorbide mononitrate 30 MG 24 hr tablet Commonly known as:  IMDUR Take 1 tablet (30 mg total) by mouth daily.   losartan 100 MG tablet Commonly known as:  COZAAR Take 1 tablet (100 mg total) by mouth daily.          Total Time in preparing paper work, data evaluation and todays exam - 86 minutes  Dustin Flock M.D on 01/25/2018 at 3:40 PM  [facility]   Office  [phone]

## 2018-01-25 NOTE — Plan of Care (Signed)
  Progressing Education: Ability to demonstrate management of disease process will improve 01/25/2018 0416 - Progressing by Jeri Cos, RN Ability to verbalize understanding of medication therapies will improve 01/25/2018 0416 - Progressing by Jeri Cos, RN Cardiac: Ability to achieve and maintain adequate cardiopulmonary perfusion will improve 01/25/2018 0416 - Progressing by Jeri Cos, RN

## 2018-01-25 NOTE — Plan of Care (Signed)
Able to provide care for herself.

## 2018-01-25 NOTE — Progress Notes (Signed)
After Dr. Manuella Ghazi spoke to her about lifestyles choices that could improve her health she is very motivated to make those changes.  She feels empowered.  She doesn't have to "just live" with diabetes and heart disease, she can take steps to improve and control these diseases.
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
## 2018-01-31 ENCOUNTER — Ambulatory Visit: Payer: Medicare Other | Admitting: Family

## 2018-01-31 ENCOUNTER — Telehealth: Payer: Self-pay | Admitting: Family

## 2018-01-31 NOTE — Telephone Encounter (Signed)
Patient did not show for her [facility] Failure [facility] appointment on 01/31/18. Will attempt to reschedule.

## 2018-02-18 ENCOUNTER — Ambulatory Visit: Payer: Medicare Other | Admitting: *Deleted

## 2018-03-10 NOTE — Progress Notes (Deleted)
   Patient ID: Renee [name], female    DOB: 10-30-1976, 42 y.o.   MRN: [id number]  HPI  Renee [name] is a 42 y/o female with a history of HTN, obesity and chronic heart failure.   Echo done 07/05/17 showed an EF of 60-65%.  Admitted 01/22/18 due to HF exacerbation. Cardiology consult was obtained. Medications adjusted. A1c elevated and insulin was recommended but patient wanted to try diet/exercise first. Discharged after 3 days.   She presents today for a follow-up visit with a chief complaint of   Past Medical History:  Diagnosis Date  . CHF (congestive heart failure) ([place])   . Hypertension    Past Surgical History:  Procedure Laterality Date  . CESAREAN SECTION    . TUBAL LIGATION     No family history on file. Social History   Tobacco Use  . Smoking status: Never Smoker  . Smokeless tobacco: Never Used  Substance Use Topics  . Alcohol use: No   No Known Allergies   Review of Systems  Constitutional: Positive for fatigue. Negative for appetite change.  HENT: Negative for congestion, postnasal drip and sore throat.   Eyes: Negative.   Respiratory: Negative for chest tightness and shortness of breath.   Cardiovascular: Negative for chest pain, palpitations and leg swelling.  Gastrointestinal: Negative for abdominal distention and abdominal pain.  Endocrine: Negative.   Genitourinary: Negative.   Musculoskeletal: Negative.   Skin: Negative.   Allergic/Immunologic: Negative.   Neurological: Negative for dizziness and light-headedness.  Hematological: Negative for adenopathy. Does not bruise/bleed easily.  Psychiatric/Behavioral: Negative for dysphoric mood and sleep disturbance. The patient is not nervous/anxious.      Physical Exam  Constitutional: She is oriented to person, place, and time. She appears well-developed and well-nourished.  HENT:  Head: Normocephalic and atraumatic.  Neck: Normal range of motion. Neck supple. No JVD present.  Cardiovascular: Normal  rate and regular rhythm.  Pulmonary/Chest: Effort normal. She has no wheezes. She has no rales.  Abdominal: Soft. She exhibits no distension. There is no tenderness.  Musculoskeletal: She exhibits no edema or tenderness.  Neurological: She is alert and oriented to person, place, and time.  Skin: Skin is warm and dry.  Psychiatric: She has a normal mood and affect. Her behavior is normal. Thought content normal.  Nursing note and vitals reviewed.  Assessment & Plan:  1: Chronic heart failure with preserved ejection fraction- - NYHA class II - euvolemic today - already weighing daily and reports a stable weight. Reminded to call for an overnight weight gain of >2 pounds or a weekly weight gain of >5 pounds - not adding salt and has been trying to read food labels. Reviewed the importance of closely following a 2000mg  sodium diet - feels like she's quite active - BNP on 01/22/18 was 1906.0  2: HTN-  - BP elevated today although patient says this is better for her - consider adding amlodipine - saw PCP Renee [name]) 08/12/17 - BMP on 01/25/18 reviewed and shows sodium 136, potassium 3.4 and GFR 18  3: Diabetes- - diet controlled - A1c on 06/15/17 was 7.1%  4: Snoring- - does endorse snoring - sleep study done 09/19/17 which showed mild sleep apnea; no indication for CPAP at this time  Medication bottles were reviewed.

## 2018-03-12 ENCOUNTER — Telehealth: Payer: Self-pay | Admitting: Family

## 2018-03-12 ENCOUNTER — Ambulatory Visit: Payer: Medicare Other | Admitting: Family

## 2018-03-12 NOTE — Telephone Encounter (Signed)
Patient did not show for her [facility] Failure [facility] appointment on 03/12/18. Will attempt to reschedule.

## 2018-07-28 DIAGNOSIS — J069 Acute upper respiratory infection, unspecified: Secondary | ICD-10-CM | POA: Diagnosis not present

## 2018-11-19 ENCOUNTER — Ambulatory Visit: Payer: Medicare Other | Admitting: Family

## 2018-11-19 ENCOUNTER — Telehealth: Payer: Self-pay | Admitting: Family

## 2018-11-19 NOTE — Telephone Encounter (Signed)
Patient did not show for her [facility] Failure Clinic appointment on 11/19/18. Will attempt to reschedule.

## 2018-12-08 NOTE — Progress Notes (Deleted)
   Patient ID: Renee [name], female    DOB: 1976-01-08, 42 y.o.   MRN: [id number]  HPI  Ms Ruble is a 42 y/o female with a history of HTN, obesity and chronic heart failure.   Echo report from 01/22/18 reviewed and showed an EF of 50-55%. Echo done 07/05/17 showed an EF of 60-65%.  Admitted 01/22/18 due to acute on chronic HF. Cardiology consult obtained. Diuresed and discharged after 3 days.   She presents today for a follow-up visit although hasn't been seen since August 2018. She presents with a chief complaint of   Past Medical History:  Diagnosis Date  . CHF (congestive heart failure) ([place])   . Hypertension    Past Surgical History:  Procedure Laterality Date  . CESAREAN SECTION    . TUBAL LIGATION     No family history on file. Social History   Tobacco Use  . Smoking status: Never Smoker  . Smokeless tobacco: Never Used  Substance Use Topics  . Alcohol use: No   No Known Allergies   Review of Systems  Constitutional: Positive for fatigue. Negative for appetite change.  HENT: Negative for congestion, postnasal drip and sore throat.   Eyes: Negative.   Respiratory: Negative for chest tightness and shortness of breath.   Cardiovascular: Negative for chest pain, palpitations and leg swelling.  Gastrointestinal: Negative for abdominal distention and abdominal pain.  Endocrine: Negative.   Genitourinary: Negative.   Musculoskeletal: Negative.   Skin: Negative.   Allergic/Immunologic: Negative.   Neurological: Negative for dizziness and light-headedness.  Hematological: Negative for adenopathy. Does not bruise/bleed easily.  Psychiatric/Behavioral: Negative for dysphoric mood and sleep disturbance. The patient is not nervous/anxious.      Physical Exam  Constitutional: She is oriented to person, place, and time. She appears well-developed and well-nourished.  HENT:  Head: Normocephalic and atraumatic.  Neck: Normal range of motion. Neck supple. No JVD present.   Cardiovascular: Normal rate and regular rhythm.  Pulmonary/Chest: Effort normal. She has no wheezes. She has no rales.  Abdominal: Soft. She exhibits no distension. There is no tenderness.  Musculoskeletal: She exhibits no edema or tenderness.  Neurological: She is alert and oriented to person, place, and time.  Skin: Skin is warm and dry.  Psychiatric: She has a normal mood and affect. Her behavior is normal. Thought content normal.  Nursing note and vitals reviewed.  Assessment & Plan:  1: Chronic heart failure with preserved ejection fraction- - NYHA class II - euvolemic today - already weighing daily and reports a stable weight. Instructed to call for an overnight weight gain of >2 pounds or a weekly weight gain of >5 pounds - weight  - not adding salt and has been trying to read food labels. Discussed the importance of closely following a 2000mg  sodium diet  - feels like she's quite active - BNP 01/22/18 was 1906.0  2: HTN-  - BP   - saw PCP [name]) 08/12/17 - BMP on 01/25/18 reviewed and shows sodium 136, potassium 3.4, creatinine 3.48 and GFR 18  3: Diabetes- - diet controlled - A1c on 01/22/18 was 8.1%   4: Snoring- - does endorse snoring - will get sleep study to rule out sleep apnea as underlying cause of HTN  Medication bottles were reviewed.

## 2018-12-10 ENCOUNTER — Telehealth: Payer: Self-pay | Admitting: Family

## 2018-12-10 ENCOUNTER — Ambulatory Visit: Payer: Medicare Other | Admitting: Family

## 2018-12-10 NOTE — Telephone Encounter (Signed)
Patient did not show for her [facility] Failure Clinic appointment on 12/10/18. Will attempt to reschedule.   This is the 5th appointment she has missed.

## 2019-01-27 NOTE — Progress Notes (Signed)
Patient ID: Renee [name], female    DOB: 25-Nov-1976, 43 y.o.   MRN: [id number]  HPI  Renee [name] is a 43 y/o female with a history of HTN, obesity and chronic heart failure.   Echo report from 01/22/18 reviewed and showed an EF of 50-55%. Echo done 07/05/17 showed an EF of 60-65%.  Has not been admitted or been in the ED in the last 6 months.   She presents today for a follow-up visit although hasn't been seen since August 2018. She presents with a chief complaint of moderate fatigue upon minimal exertion. She has associated chest pain at times and pedal edema along with this. She denies any difficulty sleeping, dizziness, abdominal distention, palpitations, shortness of breath or chest pain. Says that she saw her nephrologist August 2019.  Past Medical History:  Diagnosis Date  . CHF (congestive heart failure) ([place])   . Hypertension    Past Surgical History:  Procedure Laterality Date  . CESAREAN SECTION    . TUBAL LIGATION     No family history on file. Social History   Tobacco Use  . Smoking status: Never Smoker  . Smokeless tobacco: Never Used  Substance Use Topics  . Alcohol use: No   No Known Allergies  Prior to Admission medications   Medication Sig Start Date End Date Taking? Authorizing Provider  albuterol (PROVENTIL HFA;VENTOLIN HFA) 108 (90 Base) MCG/ACT inhaler Inhale 2 puffs into the lungs every 6 (six) hours as needed for wheezing or shortness of breath. 01/25/18  Yes Dustin Flock, MD  carvedilol (COREG) 25 MG tablet Take 1 tablet (25 mg total) by mouth 2 (two) times daily with a meal. 07/08/17  Yes Fritzi Mandes, MD  cloNIDine (CATAPRES) 0.2 MG tablet Take 1 tablet (0.2 mg total) by mouth 3 (three) times daily. 07/09/17  Yes Fritzi Mandes, MD  furosemide (LASIX) 40 MG tablet Take 1 tablet (40 mg total) by mouth daily. 01/25/18  Yes Dustin Flock, MD  hydrALAZINE (APRESOLINE) 100 MG tablet Take 100 mg by mouth 4 (four) times daily.   Yes [provider]   losartan (COZAAR) 100 MG tablet Take 1 tablet (100 mg total) by mouth daily. 07/08/17  Yes Fritzi Mandes, MD  fluticasone (FLONASE) 50 MCG/ACT nasal spray Place 1 spray into both nostrils daily. 01/25/18 01/25/19  Dustin Flock, MD    Review of Systems  Constitutional: Positive for fatigue (with minimal exertion). Negative for appetite change.  HENT: Negative for congestion, postnasal drip and sore throat.   Eyes: Negative.   Respiratory: Negative for chest tightness and shortness of breath.   Cardiovascular: Positive for chest pain (at times) and leg swelling (at times). Negative for palpitations.  Gastrointestinal: Negative for abdominal distention and abdominal pain.  Endocrine: Negative.   Genitourinary: Negative.   Musculoskeletal: Negative.   Skin: Negative.   Allergic/Immunologic: Negative.   Neurological: Negative for dizziness and light-headedness.  Hematological: Negative for adenopathy. Does not bruise/bleed easily.  Psychiatric/Behavioral: Negative for dysphoric mood and sleep disturbance. The patient is not nervous/anxious.    Vitals:   01/28/19 1418 01/28/19 1435  BP: (!) 196/113 (!) 190/100  Pulse: 70   Resp: 20   Temp: (!) 97.3 F (36.3 C)   TempSrc: Oral   SpO2: 99%   Weight: 249 lb 3.2 oz (113 kg)   Height: 5\' 5"  (1.651 m)    Wt Readings from Last 3 Encounters:  01/28/19 249 lb 3.2 oz (113 kg)  01/25/18 251 lb (113.9 kg)  08/13/17  250 lb 8 oz (113.6 kg)   Lab Results  Component Value Date   CREATININE 3.48 (H) 01/25/2018   CREATININE 3.81 (H) 01/24/2018   CREATININE 3.49 (H) 01/23/2018     Physical Exam  Constitutional: She is oriented to person, place, and time. She appears well-developed and well-nourished.  HENT:  Head: Normocephalic and atraumatic.  Neck: Normal range of motion. Neck supple. No JVD present.  Cardiovascular: Normal rate and regular rhythm.  Pulmonary/Chest: Effort normal. She has no wheezes. She has no rales.  Abdominal: Soft.  She exhibits no distension. There is no abdominal tenderness.  Musculoskeletal:        General: Edema (1+ pitting edema in bilateral lower legs) present. No tenderness.  Neurological: She is alert and oriented to person, place, and time.  Skin: Skin is warm and dry.  Psychiatric: She has a normal mood and affect. Her behavior is normal. Thought content normal.  Nursing note and vitals reviewed.  Assessment & Plan:  1: Chronic heart failure with preserved ejection fraction- - NYHA class III - euvolemic today - weighing daily and reports a stable weight. Instructed to call for an overnight weight gain of >2 pounds or a weekly weight gain of >5 pounds - weight unchanged from last visit here >1 year ago - not adding salt and has been trying to read food labels. Discussed the importance of closely following a 2000mg  sodium diet.   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 Each medication was verbally reviewed with the patient and she was encouraged to bring the bottles to every visit to confirm accuracy of list.  Return in 2 weeks or sooner for any questions/problems before then.

## 2019-01-28 ENCOUNTER — Other Ambulatory Visit: Payer: Self-pay

## 2019-01-28 ENCOUNTER — Ambulatory Visit: Payer: Medicare Other | Attending: Family | Admitting: Family

## 2019-01-28 ENCOUNTER — Encounter: Payer: Self-pay | Admitting: Family

## 2019-01-28 VITALS — BP 190/100 | HR 70 | Temp 97.3°F | Resp 20 | Ht 65.0 in | Wt 249.2 lb

## 2019-01-28 DIAGNOSIS — I110 Hypertensive heart disease with heart failure: Secondary | ICD-10-CM | POA: Diagnosis not present

## 2019-01-28 DIAGNOSIS — R5383 Other fatigue: Secondary | ICD-10-CM | POA: Insufficient documentation

## 2019-01-28 DIAGNOSIS — E119 Type 2 diabetes mellitus without complications: Secondary | ICD-10-CM | POA: Diagnosis not present

## 2019-01-28 DIAGNOSIS — Z79899 Other long term (current) drug therapy: Secondary | ICD-10-CM | POA: Diagnosis not present

## 2019-01-28 DIAGNOSIS — E669 Obesity, unspecified: Secondary | ICD-10-CM | POA: Diagnosis not present

## 2019-01-28 DIAGNOSIS — E1122 Type 2 diabetes mellitus with diabetic chronic kidney disease: Secondary | ICD-10-CM

## 2019-01-28 DIAGNOSIS — I5032 Chronic diastolic (congestive) heart failure: Secondary | ICD-10-CM | POA: Diagnosis not present

## 2019-01-28 DIAGNOSIS — N184 Chronic kidney disease, stage 4 (severe): Secondary | ICD-10-CM

## 2019-01-28 DIAGNOSIS — I10 Essential (primary) hypertension: Secondary | ICD-10-CM

## 2019-01-28 MED ORDER — SPIRONOLACTONE 25 MG PO TABS: 25.0000 mg | ORAL_TABLET | Freq: Every day | ORAL | 3 refills | Status: DC

## 2019-01-28 NOTE — Patient Instructions (Signed)
Continue weighing daily and call for an overnight weight gain of > 2 pounds or a weekly weight gain of >5 pounds. 

## 2019-01-29 ENCOUNTER — Other Ambulatory Visit: Payer: Self-pay | Admitting: Family

## 2019-01-29 ENCOUNTER — Encounter: Payer: Self-pay | Admitting: Family

## 2019-01-29 ENCOUNTER — Telehealth: Payer: Self-pay | Admitting: Family

## 2019-01-29 DIAGNOSIS — I10 Essential (primary) hypertension: Secondary | ICD-10-CM | POA: Insufficient documentation

## 2019-01-29 NOTE — Telephone Encounter (Signed)
Nephrology says that their most recent lab work was back in 2018. The most recent lab work I can see in Renee [name] is from January 2019. Advised patient that we needed to get up to date lab work before starting spironolactone.   Patient will come to [facility]mit [facility] tomorrow morning to get BMP drawn. She will not pick up the spironolactone until she hears from me.

## 2019-01-29 NOTE — Telephone Encounter (Signed)
Spoke with patient and instructed her to not begin spironolactone until we get her most lab work from her nephrologist. She says that she hasn't picked it up yet so will hold off getting it until she hears back from [place].   Will call patient once we get the lab results.

## 2019-01-30 ENCOUNTER — Encounter
Admission: RE | Admit: 2019-01-30 | Discharge: 2019-01-30 | Disposition: A | Discharge status: Home / Self Care | Payer: Medicare Other | Source: Ambulatory Visit | Attending: Family | Admitting: Family

## 2019-01-30 ENCOUNTER — Telehealth: Payer: Self-pay | Admitting: Family

## 2019-01-30 ENCOUNTER — Other Ambulatory Visit: Payer: Self-pay | Admitting: Family

## 2019-01-30 DIAGNOSIS — I5032 Chronic diastolic (congestive) heart failure: Secondary | ICD-10-CM | POA: Diagnosis not present

## 2019-01-30 DIAGNOSIS — Z01812 Encounter for preprocedural laboratory examination: Secondary | ICD-10-CM | POA: Insufficient documentation

## 2019-01-30 DIAGNOSIS — I10 Essential (primary) hypertension: Secondary | ICD-10-CM | POA: Diagnosis not present

## 2019-01-30 DIAGNOSIS — I429 Cardiomyopathy, unspecified: Secondary | ICD-10-CM | POA: Diagnosis not present

## 2019-01-30 DIAGNOSIS — J069 Acute upper respiratory infection, unspecified: Secondary | ICD-10-CM | POA: Diagnosis not present

## 2019-01-30 LAB — BASIC METABOLIC PANEL
Anion gap: 9 (ref 5–15)
BUN: 44 mg/dL — ABNORMAL HIGH (ref 6–20)
CALCIUM: 8.6 mg/dL — AB (ref 8.9–10.3)
CHLORIDE: 107 mmol/L (ref 98–111)
CO2: 22 mmol/L (ref 22–32)
CREATININE: 3.82 mg/dL — AB (ref 0.44–1.00)
GFR, EST AFRICAN AMERICAN: 16 mL/min — AB (ref >60)
GFR, EST NON AFRICAN AMERICAN: 14 mL/min — AB (ref >60)
Glucose, Bld: 147 mg/dL — ABNORMAL HIGH (ref 70–99)
Potassium: 3.7 mmol/L (ref 3.5–5.1)
SODIUM: 138 mmol/L (ref 135–145)

## 2019-01-30 MED ORDER — ISOSORBIDE MONONITRATE ER 30 MG PO TB24: 30.0000 mg | ORAL_TABLET | Freq: Every day | ORAL | 5 refills | Status: DC

## 2019-01-30 NOTE — Telephone Encounter (Signed)
Received lab results that were obtained today (01/30/2019). Renal function remains diminished with GFR 16 and creatinine of 3.82. Apparently she had missed her most recent nephrology appointment so will have the [place] call nephrology [facility]) to schedule another appointment.   Advised patient that I would be beginning isosorbide 30mg  daily to help with her intermittent chest pain and hopefully with her BP as well. Reminded her to NOT pick up the spironolactone. I called [facility] and spoke with pharmacist to make sure patient does not pick up the spironolactone & the pharmacist said they would put the spironolactone back.   Patient verbalized understanding of instructions.

## 2019-02-02 ENCOUNTER — Ambulatory Visit: Payer: Medicare Other

## 2019-02-02 ENCOUNTER — Ambulatory Visit
Admission: EM | Admit: 2019-02-02 | Discharge: 2019-02-02 | Disposition: A | Discharge status: Home / Self Care | Payer: Medicare Other | Attending: Family Medicine | Admitting: Family Medicine

## 2019-02-02 ENCOUNTER — Other Ambulatory Visit: Payer: Self-pay

## 2019-02-02 ENCOUNTER — Encounter: Payer: Self-pay | Admitting: Emergency Medicine

## 2019-02-02 DIAGNOSIS — R05 Cough: Secondary | ICD-10-CM | POA: Diagnosis not present

## 2019-02-02 DIAGNOSIS — I10 Essential (primary) hypertension: Secondary | ICD-10-CM

## 2019-02-02 DIAGNOSIS — I429 Cardiomyopathy, unspecified: Secondary | ICD-10-CM | POA: Diagnosis not present

## 2019-02-02 DIAGNOSIS — J069 Acute upper respiratory infection, unspecified: Secondary | ICD-10-CM

## 2019-02-02 DIAGNOSIS — I5032 Chronic diastolic (congestive) heart failure: Secondary | ICD-10-CM

## 2019-02-02 DIAGNOSIS — R0602 Shortness of breath: Secondary | ICD-10-CM | POA: Diagnosis not present

## 2019-02-02 MED ORDER — FLUTICASONE PROPIONATE 50 MCG/ACT NA SUSP: 1.0000 | Freq: Every day | NASAL | 2 refills | Status: DC

## 2019-02-02 MED ORDER — CLONIDINE HCL 0.2 MG PO TABS
0.2000 mg | ORAL_TABLET | Freq: Once | ORAL | Status: AC
  Administered 2019-02-02: 0.2 mg via ORAL

## 2019-02-02 MED ORDER — GUAIFENESIN-DM 100-10 MG/5ML PO SYRP: 5.0000 mL | ORAL_SOLUTION | ORAL | 0 refills | Status: DC | PRN

## 2019-02-02 MED ORDER — AZITHROMYCIN 250 MG PO TABS: ORAL_TABLET | ORAL | 0 refills | Status: DC

## 2019-02-02 NOTE — ED Triage Notes (Signed)
Patient in today c/o productive cough, sinus pressure, sob and wheezing x 3 days. Patient denies fever. Patient has tried OTC Nyquil last night.

## 2019-02-02 NOTE — ED Provider Notes (Signed)
[facility]    CSN: [id number] Arrival date & time: 02/02/19  1546     History   Chief Complaint Chief Complaint  Patient presents with  . Cough  . Sinus Problem    HPI Renee [name] is a 43 y.o. female.   Patient is a 43 year old female with past medical history of hypertension, chronic diastolic heart failure, cardiomyopathy, chronic kidney disease, diabetes who presents with complaint of productive cough with sinus pain, some shortness of breath, and some wheezing that started on Friday but is progressively gotten worse.  Patient reports she is on multiple occasions for her hypertension but has not been feeling well also has been taking her medications as prescribed.  Patient denies any fever or chills.  She does report her granddaughter has had some cold-like symptoms and that some people at work have been coughing.     Past Medical History:  Diagnosis Date  . CHF (congestive heart failure) ([place])   . Hypertension     Patient Active Problem List   Diagnosis Date Noted  . HTN (hypertension) 01/29/2019  . Chronic diastolic heart failure ([place]) 08/14/2017  . 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 Acute on chronic renal failure ([place]) 06/14/2017  . Hyponatremia 06/14/2017  . Hypokalemia 06/14/2017  . Leg swelling 06/14/2017  . Cardiomyopathy ([place]) 04/08/2015  . Chronic kidney disease (CKD), stage III (moderate) ([place]) 04/08/2015  . Accelerated hypertension 04/08/2015  . Diabetes mellitus, type 2 ([place]) 04/08/2015    Past Surgical History:  Procedure Laterality Date  . CESAREAN SECTION    . TUBAL LIGATION      OB History   No obstetric history on file.      Home Medications    Prior to Admission medications   Medication Sig Start Date End Date Taking? Authorizing Provider  carvedilol (COREG) 25 MG tablet Take 1 tablet (25 mg total) by mouth 2 (two) times daily with a meal. 07/08/17  Yes Fritzi Mandes, MD  fluticasone (FLONASE) 50 MCG/ACT nasal spray  Place 1 spray into both nostrils daily. 01/25/18 02/02/19 Yes Dustin Flock, MD  furosemide (LASIX) 40 MG tablet Take 1 tablet (40 mg total) by mouth daily. 01/25/18  Yes Dustin Flock, MD  hydrALAZINE (APRESOLINE) 100 MG tablet Take 100 mg by mouth 4 (four) times daily.   Yes [provider]  isosorbide mononitrate (IMDUR) 30 MG 24 hr tablet Take 1 tablet (30 mg total) by mouth daily for 30 days. 01/30/19 03/01/19 Yes Hackney, Otila Kluver A, FNP  losartan (COZAAR) 100 MG tablet Take 1 tablet (100 mg total) by mouth daily. 07/08/17  Yes Fritzi Mandes, MD  cloNIDine (CATAPRES) 0.2 MG tablet Take 1 tablet (0.2 mg total) by mouth 3 (three) times daily. 07/09/17   Fritzi Mandes, MD  fluticasone (FLONASE) 50 MCG/ACT nasal spray Place 1 spray into both nostrils daily. 02/02/19   Luvenia Redden, PA-C  guaiFENesin-dextromethorphan (ROBITUSSIN DM) 100-10 MG/5ML syrup Take 5 mLs by mouth every 4 (four) hours as needed for cough. 02/02/19   Luvenia Redden, PA-C    Family History Family History  Problem Relation Age of Onset  . Epilepsy Mother   . Hypertension Mother   . Hypertension Father     Social History Social History   Tobacco Use  . Smoking status: Never Smoker  . Smokeless tobacco: Never Used  Substance Use Topics  . Alcohol use: No  . Drug use: No     Allergies   Patient has no known allergies.  Review of Systems Review of Systems as noted above in HPI.  Other systems reviewed and found to be negative.    Physical Exam Triage Vital Signs ED Triage Vitals  Enc Vitals Group     BP 02/02/19 1709 (!) 244/129     Pulse Rate 02/02/19 1709 73     Resp 02/02/19 1709 18     Temp 02/02/19 1709 98.4 F (36.9 C)     Temp Source 02/02/19 1709 Oral     SpO2 02/02/19 1709 96 %     Weight 02/02/19 1707 249 lb (112.9 kg)     Height 02/02/19 1707 5\' 5"  (1.651 m)     Head Circumference --      Peak Flow --      Pain Score 02/02/19 1707 6     Pain Loc --      Pain Edu? --      Excl. in  [place]? --    No data found.  Updated Vital Signs BP (!) 234/126 (BP Location: Left Arm)   Pulse 73   Temp 98.4 F (36.9 C) (Oral)   Resp 18   Ht 5\' 5"  (1.651 m)   Wt 249 lb (112.9 kg)   LMP 01/02/2019 (Approximate)   SpO2 96%   BMI 41.44 kg/m   Physical Exam Constitutional:      Appearance: Normal appearance. She is obese.  HENT:     Head: Atraumatic.     Right Ear: A middle ear effusion is present.     Left Ear: A middle ear effusion is present.     Nose:     Right Sinus: Frontal sinus tenderness present. No maxillary sinus tenderness.     Left Sinus: Frontal sinus tenderness present. No maxillary sinus tenderness.     Mouth/Throat:     Mouth: Mucous membranes are moist.  Eyes:     Extraocular Movements: Extraocular movements intact.     Pupils: Pupils are equal, round, and reactive to light.  Cardiovascular:     Rate and Rhythm: Normal rate and regular rhythm.     Heart sounds: No murmur. No gallop.   Pulmonary:     Effort: Pulmonary effort is normal.     Breath sounds: No stridor. Wheezing present. No rhonchi.  Abdominal:     General: Abdomen is flat.     Palpations: Abdomen is soft.  Musculoskeletal:     Right lower leg: Edema present.     Left lower leg: Edema present.  Skin:    General: Skin is warm and dry.  Neurological:     General: No focal deficit present.     Mental Status: She is alert and oriented to person, place, and time.  Psychiatric:        Mood and Affect: Mood normal.        Behavior: Behavior normal.      UC Treatments / Results  Labs (all labs ordered are listed, but only abnormal results are displayed) Labs Reviewed - No data to display  EKG None  Radiology Dg Chest 2 View  Result Date: 02/02/2019 CLINICAL DATA:  Cough. Congestive heart failure history. Sinus pressure. Shortness of breath and wheezing for 3 days. EXAM: CHEST - 2 VIEW COMPARISON:  Two-view chest x-ray 01/02/2018. FINDINGS: The heart is enlarged. Pulmonary vascular  congestion is present. There is no frank edema or effusions. No focal airspace disease is present. IMPRESSION: 1. Cardiomegaly and pulmonary vascular congestion without frank edema. This raises concern for early  congestive heart failure. Electronically Signed   By: San Morelle M.D.   On: 02/02/2019 17:52    Procedures Procedures (including critical care time)  Medications Ordered in UC Medications  cloNIDine (CATAPRES) tablet 0.2 mg (0.2 mg Oral Given 02/02/19 1717)    Initial Impression / Assessment and Plan / UC Course  I have reviewed the triage vital signs and the nursing notes.  Pertinent labs & imaging results that were available during my care of the patient were reviewed by me and considered in my medical decision making (see chart for details).    Patient presents with productive cough, sinus pain and some shortness of breath and wheezing that began Friday with gradually worsening.  However patient's blood pressure noted to be elevated 232/132.  Patient given clonidine 0.2 mg p.o.  Given patient's history of CHF and her blood pressure will check a chest x-ray.  Patient with refractory hypertension with systolics still in the 841L despite 0.2 mg of clonidine.  Recommend patient go to the ER for treatment of her blood pressure.  Also advised her that some the over-the-counter medication used to treat these laboratory symptoms will also elevate her blood pressure.  Patient understands and is in agreement with plan. Final Clinical Impressions(s) / UC Diagnoses   Final diagnoses:  Essential hypertension  Chronic diastolic heart failure (HCC)  Cardiomyopathy, unspecified type ([place])  Upper respiratory tract infection, unspecified type     Discharge Instructions     -your blood is dangerously high and has not responded to medication that you have been given here at urgent care. Additionally, some medications used to treat upper respiratory symptoms can cause your blood  pressure to rise. It is strongly recommended that you go to the ER to treat your blood pressure.  -flonase: 1 spray to each nostril every morning -Robitussin DM: for cough and phlegm -take blood pressure medications as prescribed -follow up with PCP regarding blood pressure, call to make an appointment    ED Prescriptions    Medication Sig Dispense Auth. Provider   azithromycin (ZITHROMAX Z-PAK) 250 MG tablet  (Status: Discontinued) Take 2 tablets by mouth the first day followed by one tablet daily for next 4 days. 6 tablet Luvenia Redden, PA-C   fluticasone [facility]) 50 MCG/ACT nasal spray Place 1 spray into both nostrils daily. 16 g Luvenia Redden, PA-C   guaiFENesin-dextromethorphan (ROBITUSSIN DM) 100-10 MG/5ML syrup Take 5 mLs by mouth every 4 (four) hours as needed for cough. 118 mL Luvenia Redden, PA-C     Controlled Substance Prescriptions [place] Controlled Substance Registry consulted? Not Applicable   Luvenia Redden, PA-C 02/02/19 1825

## 2019-02-02 NOTE — Discharge Instructions (Addendum)
-  your blood is dangerously high and has not responded to medication that you have been given here at urgent care. Additionally, some medications used to treat upper respiratory symptoms can cause your blood pressure to rise. It is strongly recommended that you go to the ER to treat your blood pressure.  -flonase: 1 spray to each nostril every morning -Robitussin DM: for cough and phlegm -take blood pressure medications as prescribed -follow up with PCP regarding blood pressure, call to make an appointment

## 2019-02-10 NOTE — Progress Notes (Signed)
Patient ID: Renee [name], female    DOB: 13-Nov-1976, 43 y.o.   MRN: [id number]  HPI  [name] is a 43 y/o female with a history of HTN, obesity and chronic heart failure.   Echo report from 01/22/18 reviewed and showed an EF of 50-55%. Echo done 07/05/17 showed an EF of 60-65%.  Was at Urgent Care 02/02/2019 due to HTN and shortness of breath. Recommended that she go to the ED due to uncontrolled HTN but she didn't go.                                     She presents today for a follow-up visit with a chief complaint of moderate fatigue upon minimal exertion. She describes this as chronic in nature having been present for several years. She has some edema and intermittent chest pain. She denies any dizziness, blurry vision, shortness of breath, difficulty sleeping or weight gain. Was seen at Urgent Care 9 days ago and was told to go to the ED due to a BP of 244/129 but she didn't go. Ran out of losartan and carvedilol last night. Is supposed to see nephrology 03/11/2019.   Past Medical History:  Diagnosis Date  . CHF (congestive heart failure) ([place])   . Hypertension    Past Surgical History:  Procedure Laterality Date  . CESAREAN SECTION    . TUBAL LIGATION     Family History  Problem Relation Age of Onset  . Epilepsy Mother   . Hypertension Mother   . Hypertension Father    Social History   Tobacco Use  . Smoking status: Never Smoker  . Smokeless tobacco: Never Used  Substance Use Topics  . Alcohol use: No   No Known Allergies  Prior to Admission medications   Medication Sig Start Date End Date Taking? Authorizing Provider  carvedilol (COREG) 25 MG tablet Take 1 tablet (25 mg total) by mouth 2 (two) times daily with a meal. 07/08/17   Fritzi Mandes, MD  cloNIDine (CATAPRES) 0.2 MG tablet Take 1 tablet (0.2 mg total) by mouth 3 (three) times daily. 07/09/17   Fritzi Mandes, MD  fluticasone (FLONASE) 50 MCG/ACT nasal spray Place 1 spray into both nostrils daily. 01/25/18 02/02/19  Dustin Flock, MD  fluticasone (FLONASE) 50 MCG/ACT nasal spray Place 1 spray into both nostrils daily. 02/02/19   Luvenia Redden, PA-C  furosemide (LASIX) 40 MG tablet Take 1 tablet (40 mg total) by mouth daily. 01/25/18   Dustin Flock, MD  guaiFENesin-dextromethorphan (ROBITUSSIN DM) 100-10 MG/5ML syrup Take 5 mLs by mouth every 4 (four) hours as needed for cough. 02/02/19   Luvenia Redden, PA-C  hydrALAZINE (APRESOLINE) 100 MG tablet Take 100 mg by mouth 4 (four) times daily.    [provider]  isosorbide mononitrate (IMDUR) 30 MG 24 hr tablet Take 1 tablet (30 mg total) by mouth daily for 30 days. 01/30/19 03/01/19  Alisa Graff, FNP  losartan (COZAAR) 100 MG tablet Take 1 tablet (100 mg total) by mouth daily. 07/08/17   Fritzi Mandes, MD    Review of Systems  Constitutional: Positive for fatigue (with minimal exertion). Negative for appetite change.  HENT: Negative for congestion, postnasal drip and sore throat.   Eyes: Negative.   Respiratory: Negative for chest tightness and shortness of breath.   Cardiovascular: Positive for chest pain (at times) and leg swelling (at times). Negative for  palpitations.  Gastrointestinal: Negative for abdominal distention and abdominal pain.  Endocrine: Negative.   Genitourinary: Negative.   Musculoskeletal: Negative.   Skin: Negative.   Allergic/Immunologic: Negative.   Neurological: Negative for dizziness and light-headedness.  Hematological: Negative for adenopathy. Does not bruise/bleed easily.  Psychiatric/Behavioral: Negative for dysphoric mood and sleep disturbance. The patient is not nervous/anxious.    Vitals:   02/11/19 1034  BP: (!) 232/110  Pulse: 83  Resp: 18  SpO2: 96%  Weight: 249 lb (112.9 kg)  Height: 5\' 5"  (1.651 m)   Wt Readings from Last 3 Encounters:  02/11/19 249 lb (112.9 kg)  02/02/19 249 lb (112.9 kg)  01/28/19 249 lb 3.2 oz (113 kg)   Lab Results  Component Value Date   CREATININE 3.82 (H) 01/30/2019    CREATININE 3.48 (H) 01/25/2018   CREATININE 3.81 (H) 01/24/2018    Physical Exam  Constitutional: She is oriented to person, place, and time. She appears well-developed and well-nourished.  HENT:  Head: Normocephalic and atraumatic.  Neck: Normal range of motion. Neck supple. No JVD present.  Cardiovascular: Normal rate and regular rhythm.  Pulmonary/Chest: Effort normal. She has no wheezes. She has no rales.  Abdominal: Soft. She exhibits no distension. There is no abdominal tenderness.  Musculoskeletal:        General: Edema (1+ pitting edema in bilateral lower legs) present. No tenderness.  Neurological: She is alert and oriented to person, place, and time.  Skin: Skin is warm and dry.  Psychiatric: She has a normal mood and affect. Her behavior is normal. Thought content normal.  Nursing note and vitals reviewed.  Assessment & Plan:  1: Chronic heart failure with preserved ejection fraction- - NYHA class III - euvolemic today - weighing daily and reports a stable weight. Instructed to call for an overnight weight gain of >2 pounds or a weekly weight gain of >5 pounds - weight unchanged from last visit here  - not adding salt and has been trying to read food labels. Discussed the importance of closely following a 2000mg  sodium diet.   2: HTN-  - BP very elevated today (232/110) and was elevated 9 days ago at Urgent Care (244/129) - explained that I was concerned that she was at a high risk of a stroke with a BP that elevated and that it needed to be brought down while ideally being monitored - asked if I needed to escort her to the ED and she assures me that she will go directly to the ED; family member with her says that she will take her - isosorbide had been started since she was last here - saw PCP Renee [name]) 08/12/17 & she needs to call to get an appointment scheduled  3: Diabetes- - diet controlled - A1c on 06/15/17 was 7.1% - BMP on 01/30/18 reviewed and shows sodium 138,  potassium 3.7, creatinine 3.82 and GFR 16 - emphasized that she had to keep the nephrology appointment on 03/11/2019 or they would release her from the practice due to her no shows  Patient did not bring her medications nor a list. Each medication was verbally reviewed with the patient and she was encouraged to bring the bottles to every visit to confirm accuracy of list.  Return in 2 weeks or sooner for any questions/problems before then.

## 2019-02-11 ENCOUNTER — Other Ambulatory Visit: Payer: Self-pay

## 2019-02-11 ENCOUNTER — Other Ambulatory Visit: Payer: Self-pay | Admitting: Family

## 2019-02-11 ENCOUNTER — Encounter: Payer: Self-pay | Admitting: Emergency Medicine

## 2019-02-11 ENCOUNTER — Encounter: Payer: Self-pay | Admitting: Family

## 2019-02-11 ENCOUNTER — Emergency Department: Payer: Medicare Other

## 2019-02-11 ENCOUNTER — Emergency Department
Admission: EM | Admit: 2019-02-11 | Discharge: 2019-02-11 | Disposition: A | Discharge status: Home / Self Care | Payer: Medicare Other | Attending: Emergency Medicine | Admitting: Emergency Medicine

## 2019-02-11 ENCOUNTER — Ambulatory Visit: Payer: Medicare Other | Admitting: Family

## 2019-02-11 VITALS — BP 232/110 | HR 83 | Resp 18 | Ht 65.0 in | Wt 249.0 lb

## 2019-02-11 DIAGNOSIS — E119 Type 2 diabetes mellitus without complications: Secondary | ICD-10-CM

## 2019-02-11 DIAGNOSIS — Z79899 Other long term (current) drug therapy: Secondary | ICD-10-CM

## 2019-02-11 DIAGNOSIS — E1122 Type 2 diabetes mellitus with diabetic chronic kidney disease: Secondary | ICD-10-CM

## 2019-02-11 DIAGNOSIS — I5032 Chronic diastolic (congestive) heart failure: Secondary | ICD-10-CM | POA: Insufficient documentation

## 2019-02-11 DIAGNOSIS — I110 Hypertensive heart disease with heart failure: Secondary | ICD-10-CM | POA: Insufficient documentation

## 2019-02-11 DIAGNOSIS — I10 Essential (primary) hypertension: Secondary | ICD-10-CM | POA: Diagnosis not present

## 2019-02-11 DIAGNOSIS — E669 Obesity, unspecified: Secondary | ICD-10-CM | POA: Insufficient documentation

## 2019-02-11 DIAGNOSIS — J069 Acute upper respiratory infection, unspecified: Secondary | ICD-10-CM | POA: Diagnosis not present

## 2019-02-11 DIAGNOSIS — R0789 Other chest pain: Secondary | ICD-10-CM

## 2019-02-11 DIAGNOSIS — N184 Chronic kidney disease, stage 4 (severe): Secondary | ICD-10-CM

## 2019-02-11 DIAGNOSIS — Z8249 Family history of ischemic heart disease and other diseases of the circulatory system: Secondary | ICD-10-CM | POA: Insufficient documentation

## 2019-02-11 DIAGNOSIS — N183 Chronic kidney disease, stage 3 (moderate): Secondary | ICD-10-CM | POA: Diagnosis not present

## 2019-02-11 DIAGNOSIS — I130 Hypertensive heart and chronic kidney disease with heart failure and stage 1 through stage 4 chronic kidney disease, or unspecified chronic kidney disease: Secondary | ICD-10-CM | POA: Diagnosis not present

## 2019-02-11 LAB — CBC WITH DIFFERENTIAL/PLATELET
ABS IMMATURE GRANULOCYTES: 0.08 10*3/uL — AB (ref 0.00–0.07)
BASOS PCT: 1 %
Basophils Absolute: 0.1 10*3/uL (ref 0.0–0.1)
EOS PCT: 4 %
Eosinophils Absolute: 0.3 10*3/uL (ref 0.0–0.5)
HCT: 37.4 % (ref 36.0–46.0)
HEMOGLOBIN: 11.5 g/dL — AB (ref 12.0–15.0)
Immature Granulocytes: 1 %
LYMPHS PCT: 16 %
Lymphs Abs: 1.3 10*3/uL (ref 0.7–4.0)
MCH: 23.7 pg — AB (ref 26.0–34.0)
MCHC: 30.7 g/dL (ref 30.0–36.0)
MCV: 77.1 fL — AB (ref 80.0–100.0)
MONO ABS: 0.5 10*3/uL (ref 0.1–1.0)
MONOS PCT: 6 %
NEUTROS ABS: 5.9 10*3/uL (ref 1.7–7.7)
Neutrophils Relative %: 72 %
Platelets: 329 10*3/uL (ref 150–400)
RBC: 4.85 MIL/uL (ref 3.87–5.11)
RDW: 18.6 % — ABNORMAL HIGH (ref 11.5–15.5)
WBC: 8.1 10*3/uL (ref 4.0–10.5)
nRBC: 0 % (ref 0.0–0.2)

## 2019-02-11 LAB — TROPONIN I: TROPONIN I: 0.04 ng/mL — AB (ref <0.03)

## 2019-02-11 LAB — COMPREHENSIVE METABOLIC PANEL
ALT: 28 U/L (ref 0–44)
AST: 23 U/L (ref 15–41)
Albumin: 3.4 g/dL — ABNORMAL LOW (ref 3.5–5.0)
Alkaline Phosphatase: 93 U/L (ref 38–126)
Anion gap: 8 (ref 5–15)
BILIRUBIN TOTAL: 0.9 mg/dL (ref 0.3–1.2)
BUN: 48 mg/dL — AB (ref 6–20)
CHLORIDE: 109 mmol/L (ref 98–111)
CO2: 24 mmol/L (ref 22–32)
Calcium: 9.1 mg/dL (ref 8.9–10.3)
Creatinine, Ser: 3.26 mg/dL — ABNORMAL HIGH (ref 0.44–1.00)
GFR, EST AFRICAN AMERICAN: 19 mL/min — AB (ref >60)
GFR, EST NON AFRICAN AMERICAN: 17 mL/min — AB (ref >60)
Glucose, Bld: 221 mg/dL — ABNORMAL HIGH (ref 70–99)
POTASSIUM: 3.7 mmol/L (ref 3.5–5.1)
Sodium: 141 mmol/L (ref 135–145)
TOTAL PROTEIN: 6.8 g/dL (ref 6.5–8.1)

## 2019-02-11 MED ORDER — LOSARTAN POTASSIUM 50 MG PO TABS
100.0000 mg | ORAL_TABLET | Freq: Once | ORAL | Status: AC
  Administered 2019-02-11: 100 mg via ORAL
  Filled 2019-02-11: qty 2

## 2019-02-11 MED ORDER — ACETAMINOPHEN 500 MG PO TABS
1000.0000 mg | ORAL_TABLET | Freq: Once | ORAL | Status: AC
  Administered 2019-02-11: 1000 mg via ORAL
  Filled 2019-02-11: qty 2

## 2019-02-11 MED ORDER — LOSARTAN POTASSIUM 100 MG PO TABS: 100.0000 mg | ORAL_TABLET | Freq: Every day | ORAL | 5 refills | Status: DC

## 2019-02-11 MED ORDER — HYDRALAZINE HCL 50 MG PO TABS
100.0000 mg | ORAL_TABLET | Freq: Once | ORAL | Status: AC
  Administered 2019-02-11: 100 mg via ORAL
  Filled 2019-02-11: qty 2

## 2019-02-11 MED ORDER — CARVEDILOL 25 MG PO TABS
25.0000 mg | ORAL_TABLET | Freq: Two times a day (BID) | ORAL | Status: DC
  Administered 2019-02-11: 25 mg via ORAL
  Filled 2019-02-11: qty 1

## 2019-02-11 MED ORDER — CARVEDILOL 25 MG PO TABS: 25.0000 mg | ORAL_TABLET | Freq: Two times a day (BID) | ORAL | 5 refills | Status: DC

## 2019-02-11 MED ORDER — CLONIDINE HCL 0.1 MG PO TABS
0.2000 mg | ORAL_TABLET | Freq: Once | ORAL | Status: AC
  Administered 2019-02-11: 0.2 mg via ORAL
  Filled 2019-02-11: qty 2

## 2019-02-11 NOTE — Progress Notes (Signed)
[facility] - PHARMACIST COUNSELING NOTE   ASSESSMENT  Vital signs: BP > 200/100 - going to ed Renee [name] is walking her there)   ECHO: Date 01/22/18, EF 50-55%, notes below   [name], Pharm.D. Clinical Pharmacist 02/11/2019 10:34 AM

## 2019-02-11 NOTE — Patient Instructions (Signed)
Continue weighing daily and call for an overnight weight gain of > 2 pounds or a weekly weight gain of >5 pounds. 

## 2019-02-11 NOTE — ED Provider Notes (Signed)
[facility] Emergency Department Provider Note  ____________________________________________   First MD Initiated Contact with Patient 02/11/19 1502     (approximate)  I have reviewed the triage vital signs and the nursing notes.   HISTORY  Chief Complaint Hypertension and Cough   HPI Renee [name] is a 43 y.o. female with a history of CHF as well as hypertension was presented emergency department with high blood pressure as well as a cough.  The patient says that she was diagnosed with an upper respiratory infection 1 week ago and has persistent cough which is been nonproductive.  Also with runny nose.  Denying any chest pain but says that she does feel short of breath when walking for prolonged period of time.  She was seen at her cardiologist today and sent to the emergency department for further evaluation because of elevated blood pressure.  However, the patient has not taken her carvedilol nor she taken her losartan today.  She says that she has been out of these medications and just recently got them reapproved and will be able to pick them up at the pharmacy this afternoon.   Past Medical History:  Diagnosis Date  . CHF (congestive heart failure) ([place])   . Hypertension     Patient Active Problem List   Diagnosis Date Noted  . HTN (hypertension) 01/29/2019  . Chronic diastolic heart failure ([place]) 08/14/2017  . 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 Acute on chronic renal failure ([place]) 06/14/2017  . Hyponatremia 06/14/2017  . Hypokalemia 06/14/2017  . Leg swelling 06/14/2017  . Cardiomyopathy ([place]) 04/08/2015  . Chronic kidney disease (CKD), stage III (moderate) ([place]) 04/08/2015  . Accelerated hypertension 04/08/2015  . Diabetes mellitus, type 2 ([place]) 04/08/2015    Past Surgical History:  Procedure Laterality Date  . CESAREAN SECTION    . TUBAL LIGATION      Prior to Admission medications   Medication Sig Start Date End Date Taking?  Authorizing Provider  carvedilol (COREG) 25 MG tablet Take 1 tablet (25 mg total) by mouth 2 (two) times daily with a meal. 02/11/19  Yes Darylene Price A, FNP  cloNIDine (CATAPRES) 0.2 MG tablet Take 1 tablet (0.2 mg total) by mouth 3 (three) times daily. 07/09/17  Yes Fritzi Mandes, MD  fluticasone (FLONASE) 50 MCG/ACT nasal spray Place 1 spray into both nostrils daily. 01/25/18 02/11/19 Yes Dustin Flock, MD  furosemide (LASIX) 40 MG tablet Take 1 tablet (40 mg total) by mouth daily. 01/25/18  Yes Dustin Flock, MD  guaiFENesin-dextromethorphan [facility] DM) 100-10 MG/5ML syrup Take 5 mLs by mouth every 4 (four) hours as needed for cough. 02/02/19  Yes Luvenia Redden, PA-C  hydrALAZINE (APRESOLINE) 100 MG tablet Take 100 mg by mouth 4 (four) times daily.   Yes [provider]  isosorbide mononitrate (IMDUR) 30 MG 24 hr tablet Take 1 tablet (30 mg total) by mouth daily for 30 days. 01/30/19 03/01/19 Yes Hackney, Otila Kluver A, FNP  losartan (COZAAR) 100 MG tablet Take 1 tablet (100 mg total) by mouth daily. 02/11/19  Yes Alisa Graff, FNP    Allergies Patient has no known allergies.  Family History  Problem Relation Age of Onset  . Epilepsy Mother   . Hypertension Mother   . Hypertension Father     Social History Social History   Tobacco Use  . Smoking status: Never Smoker  . Smokeless tobacco: Never Used  Substance Use Topics  . Alcohol use: No  . Drug use: No  Review of Systems  Constitutional: No fever/chills Eyes: No visual changes. ENT: As above Cardiovascular: Denies chest pain. Respiratory: As above Gastrointestinal: No abdominal pain.  No nausea, no vomiting.  No diarrhea.  No constipation. Genitourinary: Negative for dysuria. Musculoskeletal: Negative for back pain. Skin: Negative for rash. Neurological: Negative for headaches, focal weakness or numbness.   ____________________________________________   PHYSICAL EXAM:  VITAL SIGNS: ED Triage Vitals    Enc Vitals Group     BP 02/11/19 1154 (!) 238/115     Pulse Rate 02/11/19 1154 83     Resp 02/11/19 1154 18     Temp 02/11/19 1154 97.9 F (36.6 C)     Temp Source 02/11/19 1154 Oral     SpO2 02/11/19 1154 99 %     Weight 02/11/19 1155 249 lb (112.9 kg)     Height 02/11/19 1155 5\' 5"  (1.651 m)     Head Circumference --      Peak Flow --      Pain Score 02/11/19 1155 0     Pain Loc --      Pain Edu? --      Excl. in [place]? --     Constitutional: Alert and oriented. Well appearing and in no acute distress. Eyes: Conjunctivae are normal.  Head: Atraumatic. Nose: No congestion/rhinnorhea. Mouth/Throat: Mucous membranes are moist.  Neck: No stridor.   Cardiovascular: Normal rate, regular rhythm. Grossly normal heart sounds.   Respiratory: Normal respiratory effort.  No retractions. Lungs CTAB. Gastrointestinal: Soft and nontender. No distention. Musculoskeletal: No lower extremity tenderness nor edema.  No joint effusions. Neurologic:  Normal speech and language. No gross focal neurologic deficits are appreciated. Skin:  Skin is warm, dry and intact. No rash noted. Psychiatric: Mood and affect are normal. Speech and behavior are normal.  ____________________________________________   LABS (all labs ordered are listed, but only abnormal results are displayed)  Labs Reviewed  CBC WITH DIFFERENTIAL/PLATELET - Abnormal; Notable for the following components:      Result Value   Hemoglobin 11.5 (*)    MCV 77.1 (*)    MCH 23.7 (*)    RDW 18.6 (*)    Abs Immature Granulocytes 0.08 (*)    All other components within normal limits  COMPREHENSIVE METABOLIC PANEL - Abnormal; Notable for the following components:   Glucose, Bld 221 (*)    BUN 48 (*)    Creatinine, Ser 3.26 (*)    Albumin 3.4 (*)    GFR calc non Af Amer 17 (*)    GFR calc Af Amer 19 (*)    All other components within normal limits  TROPONIN I - Abnormal; Notable for the following components:   Troponin I 0.04 (*)     All other components within normal limits   ____________________________________________  EKG  ED ECG REPORT I, Doran Stabler, the attending physician, personally viewed and interpreted this ECG.   Date: 02/11/2019  EKG Time: 1205  Rate: 76  Rhythm: normal sinus rhythm  Axis: normal  Intervals:none  ST&T Change: No ST segment elevation or depression.  No T wave inversions.  No significant change from previous.  ____________________________________________  RADIOLOGY  Chest x-ray with stable cardiomegaly without edema or consolidation.  ____________________________________________   PROCEDURES  Procedure(s) performed:   Procedures  Critical Care performed:   ____________________________________________   INITIAL IMPRESSION / ASSESSMENT AND PLAN / ED COURSE  Pertinent labs & imaging results that were available during my care of the patient were reviewed  by me and considered in my medical decision making (see chart for details).  Differential includes, but is not limited to, viral syndrome, bronchitis including COPD exacerbation, pneumonia, reactive airway disease including asthma, CHF including exacerbation with or without pulmonary/interstitial edema, pneumothorax, ACS, thoracic trauma, and pulmonary embolism. As part of my medical decision making, I reviewed the following data within the [place] reviewed patient's outpatient visit from today.  And Notes from prior ED visits  Patient with elevated blood pressure but otherwise likely symptomatic from recent upper respiratory infection.  No chest pain.  Based on kidney function as well as troponin and EKG.  Reassuring chest x-ray.  Will give patient the blood pressure medications which she is due at this time of day and watch for downtrending blood pressure.  She will be picking up her blood pressure medications this afternoon and follow-up with her cardiologist  thereafter.  ----------------------------------------- 5:28 PM on 02/11/2019 -----------------------------------------  Patient now with pressure of 190s over 110s.  Continues to be without chest pain or shortness of breath.  She said that she will be able to pick up her blood pressure medications at [facility] this afternoon.  No chest pain.  No shortness of breath the emergency department.  Blood pressure improving now the patient is back on all of her prescribed medications.  She will be discharged home for follow-up with cardiology.  Also will increase her Flonase to twice a day.  She is understanding of the diagnosis well treatment and willing to comply. ____________________________________________   FINAL CLINICAL IMPRESSION(S) / ED DIAGNOSES  URI.  Uncontrolled hypertension.  NEW MEDICATIONS STARTED DURING THIS VISIT:  New Prescriptions   No medications on file     Note:  This document was prepared using Dragon voice recognition software and may include unintentional dictation errors.     Orbie Pyo, MD 02/11/19 [phone]

## 2019-02-11 NOTE — ED Notes (Addendum)
Patient reports she is on four medications for blood pressure and that she has been out of two of them for several days.

## 2019-02-11 NOTE — ED Triage Notes (Signed)
Pt was sent by PCP with concerns over high blood pressure reading. Pt has a HX of HTN and reports compliance with her medication. Pt's blood pressure in triage 238/115. Pt reports she was seen for a upper respiratory infection roughly a week prior and currently denies any new complaints. Pt denies pain in triage.

## 2019-02-24 NOTE — Progress Notes (Deleted)
Patient ID: Renee [name], female    DOB: 03/11/1976, 43 y.o.   MRN: [id number]  HPI  Renee [name] is a 43 y/o female with a history of HTN, obesity and chronic heart failure.   Echo report from 01/22/18 reviewed and showed an EF of 50-55%. Echo done 07/05/17 showed an EF of 60-65%.  Was in the ED 02/11/2019 due to HTN where she was treated and released. Was at Urgent Care 02/02/2019 due to HTN and shortness of breath. Recommended that she go to the ED due to uncontrolled HTN but she didn't go.                                     She presents today for a follow-up visit with a chief complaint of   Past Medical History:  Diagnosis Date  . CHF (congestive heart failure) ([place])   . Hypertension    Past Surgical History:  Procedure Laterality Date  . CESAREAN SECTION    . TUBAL LIGATION     Family History  Problem Relation Age of Onset  . Epilepsy Mother   . Hypertension Mother   . Hypertension Father    Social History   Tobacco Use  . Smoking status: Never Smoker  . Smokeless tobacco: Never Used  Substance Use Topics  . Alcohol use: No   No Known Allergies    Review of Systems  Constitutional: Positive for fatigue (with minimal exertion). Negative for appetite change.  HENT: Negative for congestion, postnasal drip and sore throat.   Eyes: Negative.   Respiratory: Negative for chest tightness and shortness of breath.   Cardiovascular: Positive for chest pain (at times) and leg swelling (at times). Negative for palpitations.  Gastrointestinal: Negative for abdominal distention and abdominal pain.  Endocrine: Negative.   Genitourinary: Negative.   Musculoskeletal: Negative.   Skin: Negative.   Allergic/Immunologic: Negative.   Neurological: Negative for dizziness and light-headedness.  Hematological: Negative for adenopathy. Does not bruise/bleed easily.  Psychiatric/Behavioral: Negative for dysphoric mood and sleep disturbance. The patient is not nervous/anxious.       Physical Exam  Constitutional: She is oriented to person, place, and time. She appears well-developed and well-nourished.  HENT:  Head: Normocephalic and atraumatic.  Neck: Normal range of motion. Neck supple. No JVD present.  Cardiovascular: Normal rate and regular rhythm.  Pulmonary/Chest: Effort normal. She has no wheezes. She has no rales.  Abdominal: Soft. She exhibits no distension. There is no abdominal tenderness.  Musculoskeletal:        General: Edema (1+ pitting edema in bilateral lower legs) present. No tenderness.  Neurological: She is alert and oriented to person, place, and time.  Skin: Skin is warm and dry.  Psychiatric: She has a normal mood and affect. Her behavior is normal. Thought content normal.  Nursing note and vitals reviewed.  Assessment & Plan:  1: Chronic heart failure with preserved ejection fraction- - NYHA class III - euvolemic today - weighing daily and reports a stable weight. Instructed to call for an overnight weight gain of >2 pounds or a weekly weight gain of >5 pounds - weight  - not adding salt and has been trying to read food labels. Discussed the importance of closely following a 2000mg  sodium diet.   2: HTN-  - BP   - isosorbide had been started since she was last here - saw PCP Renee [name]) 08/12/17 & she needs  to call to get an appointment scheduled  3: Diabetes- - diet controlled - A1c on 06/15/17 was 7.1% - BMP on 02/11/2019 reviewed and shows sodium 141, potassium 3.7, creatinine 3.26 and GFR 19 - emphasized that she had to keep the nephrology appointment on 03/11/2019 or they would release her from the practice due to her no shows  Patient did not bring her medications nor a list. Each medication was verbally reviewed with the patient and she was encouraged to bring the bottles to every visit to confirm accuracy of list.

## 2019-02-25 ENCOUNTER — Telehealth: Payer: Self-pay | Admitting: Family

## 2019-02-25 ENCOUNTER — Ambulatory Visit: Payer: Medicare Other | Admitting: Family

## 2019-02-25 NOTE — Telephone Encounter (Signed)
Patient did not show for her [facility] appointment on 02/25/2019. Will attempt to reschedule.

## 2019-03-11 DIAGNOSIS — I129 Hypertensive chronic kidney disease with stage 1 through stage 4 chronic kidney disease, or unspecified chronic kidney disease: Secondary | ICD-10-CM | POA: Diagnosis not present

## 2019-03-11 DIAGNOSIS — N184 Chronic kidney disease, stage 4 (severe): Secondary | ICD-10-CM | POA: Diagnosis not present

## 2019-03-11 DIAGNOSIS — R809 Proteinuria, unspecified: Secondary | ICD-10-CM | POA: Diagnosis not present

## 2019-03-11 DIAGNOSIS — N2581 Secondary hyperparathyroidism of renal origin: Secondary | ICD-10-CM | POA: Diagnosis not present

## 2019-03-23 ENCOUNTER — Other Ambulatory Visit: Payer: Self-pay | Admitting: Family

## 2019-05-14 DIAGNOSIS — Z1159 Encounter for screening for other viral diseases: Secondary | ICD-10-CM | POA: Diagnosis not present

## 2019-05-19 ENCOUNTER — Other Ambulatory Visit: Payer: Self-pay

## 2019-05-26 DIAGNOSIS — N184 Chronic kidney disease, stage 4 (severe): Secondary | ICD-10-CM | POA: Diagnosis not present

## 2019-08-01 ENCOUNTER — Other Ambulatory Visit: Payer: Self-pay | Admitting: Family

## 2019-08-26 ENCOUNTER — Inpatient Hospital Stay: Payer: Medicare Other

## 2019-08-26 ENCOUNTER — Inpatient Hospital Stay
Admission: EM | Admit: 2019-08-26 | Discharge: 2019-08-30 | DRG: 291 | Disposition: A | Discharge status: Home / Self Care | Payer: Medicare Other | Attending: Internal Medicine | Admitting: Internal Medicine

## 2019-08-26 ENCOUNTER — Other Ambulatory Visit: Payer: Self-pay

## 2019-08-26 ENCOUNTER — Emergency Department: Payer: Medicare Other

## 2019-08-26 DIAGNOSIS — J810 Acute pulmonary edema: Secondary | ICD-10-CM

## 2019-08-26 DIAGNOSIS — N2581 Secondary hyperparathyroidism of renal origin: Secondary | ICD-10-CM | POA: Diagnosis not present

## 2019-08-26 DIAGNOSIS — Z9119 Patient's noncompliance with other medical treatment and regimen: Secondary | ICD-10-CM

## 2019-08-26 DIAGNOSIS — E876 Hypokalemia: Secondary | ICD-10-CM | POA: Diagnosis present

## 2019-08-26 DIAGNOSIS — E1151 Type 2 diabetes mellitus with diabetic peripheral angiopathy without gangrene: Secondary | ICD-10-CM | POA: Diagnosis present

## 2019-08-26 DIAGNOSIS — I110 Hypertensive heart disease with heart failure: Secondary | ICD-10-CM | POA: Diagnosis not present

## 2019-08-26 DIAGNOSIS — I340 Nonrheumatic mitral (valve) insufficiency: Secondary | ICD-10-CM | POA: Diagnosis not present

## 2019-08-26 DIAGNOSIS — J910 Malignant pleural effusion: Secondary | ICD-10-CM | POA: Diagnosis not present

## 2019-08-26 DIAGNOSIS — Z20828 Contact with and (suspected) exposure to other viral communicable diseases: Secondary | ICD-10-CM | POA: Diagnosis not present

## 2019-08-26 DIAGNOSIS — Z7951 Long term (current) use of inhaled steroids: Secondary | ICD-10-CM

## 2019-08-26 DIAGNOSIS — E1122 Type 2 diabetes mellitus with diabetic chronic kidney disease: Secondary | ICD-10-CM | POA: Diagnosis present

## 2019-08-26 DIAGNOSIS — I509 Heart failure, unspecified: Secondary | ICD-10-CM | POA: Diagnosis not present

## 2019-08-26 DIAGNOSIS — N179 Acute kidney failure, unspecified: Secondary | ICD-10-CM | POA: Diagnosis not present

## 2019-08-26 DIAGNOSIS — I130 Hypertensive heart and chronic kidney disease with heart failure and stage 1 through stage 4 chronic kidney disease, or unspecified chronic kidney disease: Secondary | ICD-10-CM | POA: Diagnosis present

## 2019-08-26 DIAGNOSIS — Z79899 Other long term (current) drug therapy: Secondary | ICD-10-CM | POA: Diagnosis not present

## 2019-08-26 DIAGNOSIS — I517 Cardiomegaly: Secondary | ICD-10-CM | POA: Diagnosis not present

## 2019-08-26 DIAGNOSIS — I5043 Acute on chronic combined systolic (congestive) and diastolic (congestive) heart failure: Secondary | ICD-10-CM

## 2019-08-26 DIAGNOSIS — I5032 Chronic diastolic (congestive) heart failure: Secondary | ICD-10-CM | POA: Diagnosis not present

## 2019-08-26 DIAGNOSIS — I129 Hypertensive chronic kidney disease with stage 1 through stage 4 chronic kidney disease, or unspecified chronic kidney disease: Secondary | ICD-10-CM | POA: Diagnosis not present

## 2019-08-26 DIAGNOSIS — N189 Chronic kidney disease, unspecified: Secondary | ICD-10-CM | POA: Diagnosis present

## 2019-08-26 DIAGNOSIS — Z8249 Family history of ischemic heart disease and other diseases of the circulatory system: Secondary | ICD-10-CM

## 2019-08-26 DIAGNOSIS — E119 Type 2 diabetes mellitus without complications: Secondary | ICD-10-CM

## 2019-08-26 DIAGNOSIS — I10 Essential (primary) hypertension: Secondary | ICD-10-CM | POA: Diagnosis not present

## 2019-08-26 DIAGNOSIS — R809 Proteinuria, unspecified: Secondary | ICD-10-CM | POA: Diagnosis not present

## 2019-08-26 DIAGNOSIS — Z820 Family history of epilepsy and other diseases of the nervous system: Secondary | ICD-10-CM | POA: Diagnosis not present

## 2019-08-26 DIAGNOSIS — D631 Anemia in chronic kidney disease: Secondary | ICD-10-CM | POA: Diagnosis not present

## 2019-08-26 DIAGNOSIS — F410 Panic disorder [episodic paroxysmal anxiety] without agoraphobia: Secondary | ICD-10-CM | POA: Diagnosis present

## 2019-08-26 DIAGNOSIS — N184 Chronic kidney disease, stage 4 (severe): Secondary | ICD-10-CM | POA: Diagnosis not present

## 2019-08-26 DIAGNOSIS — Z9114 Patient's other noncompliance with medication regimen: Secondary | ICD-10-CM | POA: Diagnosis not present

## 2019-08-26 DIAGNOSIS — I161 Hypertensive emergency: Secondary | ICD-10-CM | POA: Diagnosis present

## 2019-08-26 DIAGNOSIS — I160 Hypertensive urgency: Secondary | ICD-10-CM

## 2019-08-26 DIAGNOSIS — I5033 Acute on chronic diastolic (congestive) heart failure: Secondary | ICD-10-CM | POA: Diagnosis not present

## 2019-08-26 DIAGNOSIS — I361 Nonrheumatic tricuspid (valve) insufficiency: Secondary | ICD-10-CM | POA: Diagnosis not present

## 2019-08-26 DIAGNOSIS — R079 Chest pain, unspecified: Secondary | ICD-10-CM | POA: Diagnosis not present

## 2019-08-26 DIAGNOSIS — N185 Chronic kidney disease, stage 5: Secondary | ICD-10-CM | POA: Diagnosis not present

## 2019-08-26 DIAGNOSIS — R600 Localized edema: Secondary | ICD-10-CM | POA: Diagnosis not present

## 2019-08-26 HISTORY — DX: Unspecified diastolic (congestive) heart failure: I50.30

## 2019-08-26 HISTORY — DX: Morbid (severe) obesity due to excess calories: E66.01

## 2019-08-26 HISTORY — DX: Chronic kidney disease, stage 4 (severe): N18.4

## 2019-08-26 LAB — CBC
HCT: 39.5 % (ref 36.0–46.0)
Hemoglobin: 12.2 g/dL (ref 12.0–15.0)
MCH: 25.1 pg — ABNORMAL LOW (ref 26.0–34.0)
MCHC: 30.9 g/dL (ref 30.0–36.0)
MCV: 81.3 fL (ref 80.0–100.0)
Platelets: 291 10*3/uL (ref 150–400)
RBC: 4.86 MIL/uL (ref 3.87–5.11)
RDW: 17.8 % — ABNORMAL HIGH (ref 11.5–15.5)
WBC: 11.1 10*3/uL — ABNORMAL HIGH (ref 4.0–10.5)
nRBC: 0 % (ref 0.0–0.2)

## 2019-08-26 LAB — COMPREHENSIVE METABOLIC PANEL
ALT: 19 U/L (ref 0–44)
AST: 13 U/L — ABNORMAL LOW (ref 15–41)
Albumin: 3.3 g/dL — ABNORMAL LOW (ref 3.5–5.0)
Alkaline Phosphatase: 126 U/L (ref 38–126)
Anion gap: 9 (ref 5–15)
BUN: 59 mg/dL — ABNORMAL HIGH (ref 6–20)
CO2: 23 mmol/L (ref 22–32)
Calcium: 8.8 mg/dL — ABNORMAL LOW (ref 8.9–10.3)
Chloride: 109 mmol/L (ref 98–111)
Creatinine, Ser: 5.03 mg/dL — ABNORMAL HIGH (ref 0.44–1.00)
GFR calc Af Amer: 11 mL/min — ABNORMAL LOW (ref >60)
GFR calc non Af Amer: 10 mL/min — ABNORMAL LOW (ref >60)
Glucose, Bld: 223 mg/dL — ABNORMAL HIGH (ref 70–99)
Potassium: 3.4 mmol/L — ABNORMAL LOW (ref 3.5–5.1)
Sodium: 141 mmol/L (ref 135–145)
Total Bilirubin: 0.8 mg/dL (ref 0.3–1.2)
Total Protein: 7 g/dL (ref 6.5–8.1)

## 2019-08-26 LAB — PROTEIN / CREATININE RATIO, URINE
Creatinine, Urine: 70 mg/dL
Protein Creatinine Ratio: 2.4 mg/mg{Cre} — ABNORMAL HIGH (ref 0.00–0.15)
Total Protein, Urine: 168 mg/dL

## 2019-08-26 LAB — BRAIN NATRIURETIC PEPTIDE: B Natriuretic Peptide: 1589 pg/mL — ABNORMAL HIGH (ref 0.0–100.0)

## 2019-08-26 LAB — GLUCOSE, CAPILLARY
Glucose-Capillary: 187 mg/dL — ABNORMAL HIGH (ref 70–99)
Glucose-Capillary: 190 mg/dL — ABNORMAL HIGH (ref 70–99)

## 2019-08-26 LAB — TROPONIN I (HIGH SENSITIVITY)
Troponin I (High Sensitivity): 32 ng/L — ABNORMAL HIGH (ref <18)
Troponin I (High Sensitivity): 33 ng/L — ABNORMAL HIGH (ref <18)

## 2019-08-26 LAB — HEMOGLOBIN A1C
Hgb A1c MFr Bld: 7.5 % — ABNORMAL HIGH (ref 4.8–5.6)
Mean Plasma Glucose: 168.55 mg/dL

## 2019-08-26 LAB — MAGNESIUM: Magnesium: 2.4 mg/dL (ref 1.7–2.4)

## 2019-08-26 LAB — MRSA PCR SCREENING: MRSA by PCR: NEGATIVE

## 2019-08-26 LAB — TSH: TSH: 4.124 u[IU]/mL (ref 0.350–4.500)

## 2019-08-26 MED ORDER — ISOSORBIDE MONONITRATE ER 30 MG PO TB24
60.0000 mg | ORAL_TABLET | Freq: Two times a day (BID) | ORAL | Status: DC
  Administered 2019-08-26 – 2019-08-28 (×4): 60 mg via ORAL
  Filled 2019-08-26 (×4): qty 2

## 2019-08-26 MED ORDER — NICARDIPINE HCL IN NACL 20-0.86 MG/200ML-% IV SOLN
3.0000 mg/h | INTRAVENOUS | Status: DC
  Administered 2019-08-26 (×2): 5 mg/h via INTRAVENOUS
  Administered 2019-08-26: 18:00:00 10 mg/h via INTRAVENOUS
  Administered 2019-08-26: 12 mg/h via INTRAVENOUS
  Administered 2019-08-27: 02:00:00 3 mg/h via INTRAVENOUS
  Filled 2019-08-26 (×8): qty 200

## 2019-08-26 MED ORDER — CARVEDILOL 25 MG PO TABS
25.0000 mg | ORAL_TABLET | Freq: Two times a day (BID) | ORAL | Status: DC
  Administered 2019-08-26 – 2019-08-30 (×8): 25 mg via ORAL
  Filled 2019-08-26 (×2): qty 1
  Filled 2019-08-26 (×3): qty 2
  Filled 2019-08-26: qty 1
  Filled 2019-08-26 (×2): qty 2

## 2019-08-26 MED ORDER — HEPARIN SODIUM (PORCINE) 5000 UNIT/ML IJ SOLN
5000.0000 [IU] | Freq: Three times a day (TID) | INTRAMUSCULAR | Status: DC
  Administered 2019-08-26 – 2019-08-27 (×3): 5000 [IU] via SUBCUTANEOUS
  Filled 2019-08-26 (×7): qty 1

## 2019-08-26 MED ORDER — PROMETHAZINE HCL 25 MG/ML IJ SOLN
12.5000 mg | Freq: Once | INTRAMUSCULAR | Status: AC
  Administered 2019-08-26: 09:00:00 12.5 mg via INTRAVENOUS
  Filled 2019-08-26: qty 1

## 2019-08-26 MED ORDER — CHLORHEXIDINE GLUCONATE CLOTH 2 % EX PADS
6.0000 | MEDICATED_PAD | Freq: Every day | CUTANEOUS | Status: DC
  Administered 2019-08-26 – 2019-08-30 (×4): 6 via TOPICAL

## 2019-08-26 MED ORDER — CLONIDINE HCL 0.1 MG PO TABS
0.2000 mg | ORAL_TABLET | Freq: Two times a day (BID) | ORAL | Status: DC
  Administered 2019-08-26 – 2019-08-30 (×9): 0.2 mg via ORAL
  Filled 2019-08-26 (×9): qty 2

## 2019-08-26 MED ORDER — FUROSEMIDE 10 MG/ML IJ SOLN
40.0000 mg | Freq: Two times a day (BID) | INTRAMUSCULAR | Status: DC
  Administered 2019-08-26 – 2019-08-28 (×4): 40 mg via INTRAVENOUS
  Filled 2019-08-26 (×5): qty 4

## 2019-08-26 MED ORDER — SPIRONOLACTONE 25 MG PO TABS
50.0000 mg | ORAL_TABLET | Freq: Every day | ORAL | Status: DC
  Filled 2019-08-26: qty 2

## 2019-08-26 MED ORDER — POTASSIUM CHLORIDE CRYS ER 20 MEQ PO TBCR
30.0000 meq | EXTENDED_RELEASE_TABLET | Freq: Two times a day (BID) | ORAL | Status: AC
  Administered 2019-08-26 (×2): 30 meq via ORAL
  Filled 2019-08-26 (×2): qty 2

## 2019-08-26 MED ORDER — NITROGLYCERIN 2 % TD OINT
1.0000 [in_us] | TOPICAL_OINTMENT | Freq: Four times a day (QID) | TRANSDERMAL | Status: DC
  Administered 2019-08-26: 1 [in_us] via TOPICAL
  Filled 2019-08-26: qty 1

## 2019-08-26 MED ORDER — GUAIFENESIN-CODEINE 100-10 MG/5ML PO SOLN: 5.0000 mL | Freq: Two times a day (BID) | ORAL | Status: DC | PRN

## 2019-08-26 MED ORDER — INSULIN ASPART 100 UNIT/ML ~~LOC~~ SOLN
0.0000 [IU] | Freq: Three times a day (TID) | SUBCUTANEOUS | Status: DC
  Administered 2019-08-27 (×2): 2 [IU] via SUBCUTANEOUS
  Administered 2019-08-27: 12:00:00 1 [IU] via SUBCUTANEOUS
  Administered 2019-08-28: 2 [IU] via SUBCUTANEOUS
  Administered 2019-08-28: 17:00:00 1 [IU] via SUBCUTANEOUS
  Administered 2019-08-28: 08:00:00 3 [IU] via SUBCUTANEOUS
  Administered 2019-08-29: 1 [IU] via SUBCUTANEOUS
  Administered 2019-08-29: 13:00:00 2 [IU] via SUBCUTANEOUS
  Administered 2019-08-29: 09:00:00 1 [IU] via SUBCUTANEOUS
  Administered 2019-08-29: 13:00:00 2 [IU] via SUBCUTANEOUS
  Administered 2019-08-30 (×2): 1 [IU] via SUBCUTANEOUS
  Filled 2019-08-26 (×11): qty 1

## 2019-08-26 MED ORDER — INSULIN ASPART 100 UNIT/ML ~~LOC~~ SOLN: 0.0000 [IU] | Freq: Every day | SUBCUTANEOUS | Status: DC

## 2019-08-26 MED ORDER — NITROGLYCERIN 2 % TD OINT: 0.5000 [in_us] | TOPICAL_OINTMENT | Freq: Four times a day (QID) | TRANSDERMAL | Status: DC

## 2019-08-26 MED ORDER — LOSARTAN POTASSIUM 50 MG PO TABS
100.0000 mg | ORAL_TABLET | Freq: Every day | ORAL | Status: DC
  Filled 2019-08-26: qty 2

## 2019-08-26 MED ORDER — METOPROLOL TARTRATE 5 MG/5ML IV SOLN
10.0000 mg | Freq: Once | INTRAVENOUS | Status: AC
  Administered 2019-08-26: 10 mg via INTRAVENOUS
  Filled 2019-08-26: qty 10

## 2019-08-26 MED ORDER — HYDRALAZINE HCL 50 MG PO TABS
100.0000 mg | ORAL_TABLET | Freq: Three times a day (TID) | ORAL | Status: DC
  Administered 2019-08-26 – 2019-08-28 (×7): 100 mg via ORAL
  Filled 2019-08-26 (×7): qty 2

## 2019-08-26 MED ORDER — ISOSORBIDE MONONITRATE ER 30 MG PO TB24
30.0000 mg | ORAL_TABLET | Freq: Every day | ORAL | Status: DC
  Filled 2019-08-26: qty 1

## 2019-08-26 MED ORDER — FUROSEMIDE 10 MG/ML IJ SOLN
40.0000 mg | Freq: Once | INTRAMUSCULAR | Status: AC
  Administered 2019-08-26: 09:00:00 40 mg via INTRAVENOUS
  Filled 2019-08-26: qty 4

## 2019-08-26 NOTE — ED Provider Notes (Addendum)
[facility] Emergency Department Provider Note   ____________________________________________   First MD Initiated Contact with Patient 08/26/19 [phone]     (approximate)  I have reviewed the triage vital signs and the nursing notes.   HISTORY  Chief Complaint Shortness of Breath    HPI Renee [name] is a 43 y.o. female who complains of chest heaviness and leg swelling for about a week.  She has some dizziness as well.  She short of breath especially with exertion.  She wakes up in the middle of the night very anxious and has to sit up.  It sounds like she is a little short of breath and that wakes her up.  Patient is taking her medicines but her blood pressure is very high today.  Sats are good but she feels short of breath and has some crackles in the bases.      Past Medical History:  Diagnosis Date  . CHF (congestive heart failure) ([place])   . Hypertension     Patient Active Problem List   Diagnosis Date Noted  . HTN (hypertension) 01/29/2019  . Chronic diastolic heart failure ([place]) 08/14/2017  . 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 Acute on chronic renal failure ([place]) 06/14/2017  . Hyponatremia 06/14/2017  . Hypokalemia 06/14/2017  . Leg swelling 06/14/2017  . Cardiomyopathy ([place]) 04/08/2015  . Chronic kidney disease (CKD), stage III (moderate) ([place]) 04/08/2015  . Accelerated hypertension 04/08/2015  . Diabetes mellitus, type 2 ([place]) 04/08/2015    Past Surgical History:  Procedure Laterality Date  . CESAREAN SECTION    . TUBAL LIGATION      Prior to Admission medications   Medication Sig Start Date End Date Taking? Authorizing Provider  carvedilol (COREG) 25 MG tablet Take 1 tablet (25 mg total) by mouth 2 (two) times daily with a meal. 02/11/19   Alisa Graff, FNP  cloNIDine (CATAPRES) 0.2 MG tablet Take 1 tablet (0.2 mg total) by mouth 3 (three) times daily. 07/09/17   Fritzi Mandes, MD  fluticasone (FLONASE) 50 MCG/ACT nasal spray  Place 1 spray into both nostrils daily. 01/25/18 02/11/19  Dustin Flock, MD  furosemide (LASIX) 40 MG tablet Take 1 tablet (40 mg total) by mouth daily. 01/25/18   Dustin Flock, MD  guaiFENesin-dextromethorphan (ROBITUSSIN DM) 100-10 MG/5ML syrup Take 5 mLs by mouth every 4 (four) hours as needed for cough. 02/02/19   Luvenia Redden, PA-C  hydrALAZINE (APRESOLINE) 100 MG tablet Take 100 mg by mouth 4 (four) times daily.    [provider]  isosorbide mononitrate (IMDUR) 30 MG 24 hr tablet Take 1 tablet (30 mg total) by mouth daily for 30 days. 01/30/19 03/01/19  Alisa Graff, FNP  losartan (COZAAR) 100 MG tablet Take 1 tablet (100 mg total) by mouth daily. 02/11/19   Alisa Graff, FNP    Allergies Patient has no known allergies.  Family History  Problem Relation Age of Onset  . Epilepsy Mother   . Hypertension Mother   . Hypertension Father     Social History Social History   Tobacco Use  . Smoking status: Never Smoker  . Smokeless tobacco: Never Used  Substance Use Topics  . Alcohol use: No  . Drug use: No    Review of Systems  Constitutional: No fever/chills Eyes: No visual changes. ENT: No sore throat. Cardiovascular: Denies chest pain. Respiratory:shortness of breath. Gastrointestinal: No abdominal pain.  No nausea, no vomiting.  No diarrhea.  No constipation. Genitourinary: Negative for dysuria.  Musculoskeletal: Negative for back pain. Skin: Negative for rash. Neurological: Negative for headaches, focal weakness   ____________________________________________   PHYSICAL EXAM:  VITAL SIGNS: ED Triage Vitals  Enc Vitals Group     BP 08/26/19 0441 (!) 236/137     Pulse Rate 08/26/19 0441 82     Resp 08/26/19 0441 20     Temp 08/26/19 0441 98.5 F (36.9 C)     Temp Source 08/26/19 0441 Oral     SpO2 08/26/19 0441 96 %     Weight 08/26/19 0442 249 lb (112.9 kg)     Height 08/26/19 0442 5\' 5"  (1.651 m)     Head Circumference --      Peak Flow  --      Pain Score 08/26/19 0442 7     Pain Loc --      Pain Edu? --      Excl. in [place]? --     Constitutional: Alert and oriented. Well appearing and in no acute distress. Eyes: Conjunctivae are normal.  Head: Atraumatic. Nose: No congestion/rhinnorhea. Mouth/Throat: Mucous membranes are moist.  Oropharynx non-erythematous. Neck: No stridor.  Cardiovascular: Normal rate, regular rhythm. Grossly normal heart sounds.  Good peripheral circulation. Respiratory: Normal respiratory effort.  No retractions. Lungs CTAB. Gastrointestinal: Soft and nontender. No distention. No abdominal bruits. No CVA tenderness. Musculoskeletal: No lower extremity tenderness marked bilateral edema.   Neurologic:  Normal speech and language. No gross focal neurologic deficits are appreciated.  Skin:  Skin is warm, dry and intact. No rash noted.   ____________________________________________   LABS (all labs ordered are listed, but only abnormal results are displayed)  Labs Reviewed  CBC - Abnormal; Notable for the following components:      Result Value   WBC 11.1 (*)    MCH 25.1 (*)    RDW 17.8 (*)    All other components within normal limits  COMPREHENSIVE METABOLIC PANEL - Abnormal; Notable for the following components:   Potassium 3.4 (*)    Glucose, Bld 223 (*)    BUN 59 (*)    Creatinine, Ser 5.03 (*)    Calcium 8.8 (*)    Albumin 3.3 (*)    AST 13 (*)    GFR calc non Af Amer 10 (*)    GFR calc Af Amer 11 (*)    All other components within normal limits  BRAIN NATRIURETIC PEPTIDE - Abnormal; Notable for the following components:   B Natriuretic Peptide 1,589.0 (*)    All other components within normal limits  TROPONIN I (HIGH SENSITIVITY) - Abnormal; Notable for the following components:   Troponin I (High Sensitivity) 32 (*)    All other components within normal limits  TROPONIN I (HIGH SENSITIVITY) - Abnormal; Notable for the following components:   Troponin I (High Sensitivity) 33 (*)     All other components within normal limits  NOVEL CORONAVIRUS, NAA (HOSP ORDER, SEND-OUT TO REF LAB; TAT 18-24 HRS)   ____________________________________________  EKG   ____________________________________________  RADIOLOGY  ED MD interpretation: Chest x-ray read by radiology as atelectasis or infection in the right base.  I reviewed the film.  I think she has CHF.  Her BNP is 1589 she has a history of it she does not have much of an elevated white count she does not have a fever no marked cough  Official radiology report(s): Dg Chest 2 View  Result Date: 08/26/2019 CLINICAL DATA:  Chest pain EXAM: CHEST - 2 VIEW COMPARISON:  02/11/2019 FINDINGS: Cardiomegaly. Streaky and hazy density at the right base where there is likely small volume pleural fluid. IMPRESSION: 1. Atelectasis or infection at the right base with small pleural effusion. 2. Chronic cardiomegaly. Electronically Signed   By: Monte Fantasia M.D.   On: 08/26/2019 07:14    ____________________________________________   PROCEDURES  Procedure(s) performed (including Critical Care): Critical care time 20 minutes.  This includes reviewing the patient's history and records talking to the hospitalist examining and evaluating her.  Procedures   ____________________________________________   INITIAL IMPRESSION / ASSESSMENT AND PLAN / ED COURSE  Patient with markedly elevated blood pressure essentially hypertensive urgency with CHF.  She is not hypoxic.  We will have to get her in the hospital to get the blood pressure down I believe.     Brene Sessions was evaluated in Emergency Department on 09/01/2019 for the symptoms described in the history of present illness. She was evaluated in the context of the global COVID-19 pandemic, which necessitated consideration that the patient might be at risk for infection with the SARS-CoV-2 virus that causes COVID-19. Institutional protocols and algorithms that pertain to the  evaluation of patients at risk for COVID-19 are in a state of rapid change based on information released by regulatory bodies including the CDC and federal and state organizations. These policies and algorithms were followed during the patient's care in the ED.         ____________________________________________   FINAL CLINICAL IMPRESSION(S) / ED DIAGNOSES  Final diagnoses:  Congestive heart failure, unspecified HF chronicity, unspecified heart failure type [facility])  Hypertensive urgency     ED Discharge Orders    None       Note:  This document was prepared using Dragon voice recognition software and may include unintentional dictation errors.    Nena Polio, MD 08/26/19 [id number]    Nena Polio, MD 09/01/19 [phone]

## 2019-08-26 NOTE — H&P (Addendum)
[facility] NAME: Renee [name]    MR#:  [id number]  DATE OF BIRTH:  [birth year]  DATE OF ADMISSION:  08/26/2019  PRIMARY CARE PHYSICIAN: Minda Ditto, MD   REQUESTING/REFERRING PHYSICIAN:   CHIEF COMPLAINT:   Shortness of breath with chest discomfort HISTORY OF PRESENT ILLNESS:  Renee [name]  is a 43 y.o. female with a known history of hypertension and congestive heart failure follows up with the CHF clinic as an outpatient is presenting to the ED with a chief complaint of chest heaviness and significant swelling in the bilateral lower extremities and probably weight gain but patient could not tell how many pounds she has gained in the past few days.  Reports that she is compliant with the medications but sleeping at home by propping her head up by 2 pillows.  Patient's BNP is elevated to 1589 and creatinine at 5.03 baseline seems to be at 3.5.  Patient's blood pressure was significantly elevated at the time of arrival to the ED-267/161.  Chest x-ray with a possible right-sided pleural effusion small size.  Patient's daughter at bedside.  PAST MEDICAL HISTORY:   Past Medical History:  Diagnosis Date  . CHF (congestive heart failure) ([place])   . Hypertension     PAST SURGICAL HISTOIRY:   Past Surgical History:  Procedure Laterality Date  . CESAREAN SECTION    . TUBAL LIGATION      SOCIAL HISTORY:   Social History   Tobacco Use  . Smoking status: Never Smoker  . Smokeless tobacco: Never Used  Substance Use Topics  . Alcohol use: No    FAMILY HISTORY:   Family History  Problem Relation Age of Onset  . Epilepsy Mother   . Hypertension Mother   . Hypertension Father     DRUG ALLERGIES:  No Known Allergies  REVIEW OF SYSTEMS:  CONSTITUTIONAL: No fever, fatigue or weakness.  EYES: No blurred or double vision.  EARS, NOSE, AND THROAT: No tinnitus or ear pain.  RESPIRATORY: No cough, shortness of  breath, wheezing or hemoptysis.  CARDIOVASCULAR: No chest pain, orthopnea, edema.  GASTROINTESTINAL: No nausea, vomiting, diarrhea or abdominal pain.  GENITOURINARY: No dysuria, hematuria.  ENDOCRINE: No polyuria, nocturia,  HEMATOLOGY: No anemia, easy bruising or bleeding SKIN: No rash or lesion. MUSCULOSKELETAL: No joint pain or arthritis.  Reporting lower extremity swelling NEUROLOGIC: No tingling, numbness, weakness.  Denies any headache or blurry vision PSYCHIATRY: No anxiety or depression.   MEDICATIONS AT HOME:   Prior to Admission medications   Medication Sig Start Date End Date Taking? Authorizing Provider  carvedilol (COREG) 25 MG tablet Take 1 tablet (25 mg total) by mouth 2 (two) times daily with a meal. 02/11/19  Yes Darylene Price A, FNP  cloNIDine (CATAPRES) 0.2 MG tablet Take 1 tablet (0.2 mg total) by mouth 3 (three) times daily. 07/09/17  Yes Fritzi Mandes, MD  furosemide (LASIX) 40 MG tablet Take 1 tablet (40 mg total) by mouth daily. 01/25/18  Yes Dustin Flock, MD  hydrALAZINE (APRESOLINE) 100 MG tablet Take 100 mg by mouth 4 (four) times daily.   Yes [provider]  isosorbide mononitrate (IMDUR) 30 MG 24 hr tablet Take 1 tablet (30 mg total) by mouth daily for 30 days. 01/30/19 08/26/19 Yes Darylene Price A, FNP  losartan (COZAAR) 100 MG tablet Take 1 tablet (100 mg total) by mouth daily. 02/11/19  Yes Darylene Price A, FNP  spironolactone (ALDACTONE) 25 MG tablet  Take 25 mg by mouth daily. 05/08/19  Yes [provider]  triamcinolone cream (KENALOG) 0.1 % Apply 1 application topically 2 (two) times daily. 08/21/19  Yes [provider]  fluticasone (FLONASE) 50 MCG/ACT nasal spray Place 1 spray into both nostrils daily. 01/25/18 02/11/19  Dustin Flock, MD  guaiFENesin-dextromethorphan (ROBITUSSIN DM) 100-10 MG/5ML syrup Take 5 mLs by mouth every 4 (four) hours as needed for cough. Patient not taking: Reported on 08/26/2019 02/02/19   Luvenia Redden,  PA-C      VITAL SIGNS:  Blood pressure (!) 195/76, pulse 85, temperature 98.5 F (36.9 C), temperature source Oral, resp. rate (!) 22, height 5\' 5"  (1.651 m), weight 112.9 kg, SpO2 97 %.  PHYSICAL EXAMINATION:  GENERAL:  43 y.o.-year-old patient lying in the bed with no acute distress.  EYES: Pupils equal, round, reactive to light and accommodation. No scleral icterus. Extraocular muscles intact.  HEENT: Head atraumatic, normocephalic. Oropharynx and nasopharynx clear.  NECK:  Supple, no jugular venous distention. No thyroid enlargement, no tenderness.  LUNGS: Normal breath sounds bilaterally, no wheezing, rales,rhonchi or crepitation. No use of accessory muscles of respiration.  CARDIOVASCULAR: S1, S2 normal. No murmurs, rubs, or gallops.  ABDOMEN: Soft, nontender, nondistended. Bowel sounds present.  EXTREMITIES: significant lower extremity  pedal edema, no cyanosis, or clubbing.  NEUROLOGIC: Cranial nerves II through XII are intact. Muscle strength 5/5 in all extremities. Sensation intact. Gait not checked.  PSYCHIATRIC: The patient is alert and oriented x 3.  SKIN: No obvious rash, lesion, or ulcer.   LABORATORY PANEL:   CBC Recent Labs  Lab 08/26/19 0451  WBC 11.1*  HGB 12.2  HCT 39.5  PLT 291   ------------------------------------------------------------------------------------------------------------------  Chemistries  Recent Labs  Lab 08/26/19 0451  NA 141  K 3.4*  CL 109  CO2 23  GLUCOSE 223*  BUN 59*  CREATININE 5.03*  CALCIUM 8.8*  AST 13*  ALT 19  ALKPHOS 126  BILITOT 0.8   ------------------------------------------------------------------------------------------------------------------  Cardiac Enzymes No results for input(s): TROPONINI in the last 168 hours. ------------------------------------------------------------------------------------------------------------------  RADIOLOGY:  Dg Chest 2 View  Result Date: 08/26/2019 CLINICAL DATA:   Chest pain EXAM: CHEST - 2 VIEW COMPARISON:  02/11/2019 FINDINGS: Cardiomegaly. Streaky and hazy density at the right base where there is likely small volume pleural fluid. IMPRESSION: 1. Atelectasis or infection at the right base with small pleural effusion. 2. Chronic cardiomegaly. Electronically Signed   By: Monte Fantasia M.D.   On: 08/26/2019 07:14    EKG:   Orders placed or performed during the hospital encounter of 08/26/19  . EKG 12-Lead  . 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 spironolactone Echocardiogram Intake and output and monitor daily weights Cardiology consult placed and notified to Dr. Rockey Situ  #Hypokalemia Replete and recheck in a.m.  #Morbid obesity Advised lifestyle modification  All the records are reviewed  and case discussed with ED provider. Management plans discussed with the patient, daughter at bed side  and they are in agreement.  Husband and sister Velva Harman are  the healthcare power of attorney  CODE STATUS: fc   TOTAL critical careTIME TAKING CARE OF THIS PATIENT: 50  minutes.   Note: This dictation was prepared with Dragon dictation along with smaller phrase technology. Any transcriptional errors that result from this process are unintentional.  Nicholes Mango M.D on 08/26/2019 at 1:22 PM  Between 7am to 6pm - Pager - [phone]  After 6pm go to  [email or web address] - password [email or web address] [facility]  Office  [phone]  CC: Primary care physician; Minda Ditto, MD

## 2019-08-26 NOTE — Progress Notes (Signed)
[facility], [place] 08/26/19  Subjective:   LOS: 0 No intake/output data recorded. Patient presented to the emergency room today with chest heaviness and swelling all over since Sunday.  Feeling abdominal pressure and dizziness.  Blood pressure at presentation was 264/159 now on cardene drip in ICU Has LE edema Getting Iv lasix for diuresis   Objective:  Vital signs in last 24 hours:  Temp:  [97.8 F (36.6 C)-98.5 F (36.9 C)] 97.8 F (36.6 C) (08/26 1411) Pulse Rate:  [70-85] 84 (08/26 1411) Resp:  [20-31] 21 (08/26 1411) BP: (171-269)/(76-170) 181/87 (08/26 1411) SpO2:  [89 %-97 %] 96 % (08/26 1330) Weight:  [112.9 kg-117.5 kg] 117.5 kg (08/26 1411)  Weight change:  Filed Weights   08/26/19 0442 08/26/19 1411  Weight: 112.9 kg 117.5 kg    Intake/Output:    Intake/Output Summary (Last 24 hours) at 08/26/2019 1429 Last data filed at 08/26/2019 1324 Gross per 24 hour  Intake 231.23 ml  Output -  Net 231.23 ml     Physical Exam: General: Laying in bed, looks uncomfortable  HEENT Moist oral mucus membranes  Neck supple  Pulm/lungs B/l crackles  CVS/Heart regular  Abdomen:  Soft, NT  Extremities: 2+ edema  Neurologic: Alert, oreinted  Skin: Warm dry  Access: none       Basic Metabolic Panel:  Recent Labs  Lab 08/26/19 0451  NA 141  K 3.4*  CL 109  CO2 23  GLUCOSE 223*  BUN 59*  CREATININE 5.03*  CALCIUM 8.8*     CBC: Recent Labs  Lab 08/26/19 0451  WBC 11.1*  HGB 12.2  HCT 39.5  MCV 81.3  PLT 291     No results found for: HEPBSAG, HEPBSAB, HEPBIGM    Microbiology:  No results found for this or any previous visit (from the past 240 hour(s)).  Coagulation Studies: No results for input(s): LABPROT, INR in the last 72 hours.  Urinalysis: No results for input(s): COLORURINE, LABSPEC, PHURINE, GLUCOSEU, HGBUR, BILIRUBINUR, KETONESUR, PROTEINUR, UROBILINOGEN, NITRITE, LEUKOCYTESUR in the last 72  hours.  Invalid input(s): APPERANCEUR    Imaging: Dg Chest 2 View  Result Date: 08/26/2019 CLINICAL DATA:  Chest pain EXAM: CHEST - 2 VIEW COMPARISON:  02/11/2019 FINDINGS: Cardiomegaly. Streaky and hazy density at the right base where there is likely small volume pleural fluid. IMPRESSION: 1. Atelectasis or infection at the right base with small pleural effusion. 2. Chronic cardiomegaly. Electronically Signed   By: Monte Fantasia M.D.   On: 08/26/2019 07:14     Medications:   . niCARDipine 12 mg/hr (08/26/19 1414)   . carvedilol  25 mg Oral BID WC  . Chlorhexidine Gluconate Cloth  6 each Topical Daily  . cloNIDine  0.2 mg Oral BID  . furosemide  40 mg Intravenous BID  . isosorbide mononitrate  30 mg Oral Daily  . losartan  100 mg Oral Daily  . potassium chloride  30 mEq Oral BID  . spironolactone  50 mg Oral Daily     Assessment/ Plan:  43 y.o. female with Poorly controlled hypertension, diabetes type 2, morbid obesity, anemia, chronic kidney disease, h/o severe LVH Dx [id number], diastolic dysfunction  Active Problems:   HTN (hypertension), malignant   #. 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 Actually she denies knowledge of diabetes Last known baseline creatinine is 3.26  from February 11, 2019, GFR of 19 Current Cr 5.03/GFR 11 It is quite possible that she has progressed to end-stage renal disease Plan to obtain renal U.S Urine Pr/Cr ratio Previous work up in 2018 includes, neg SPEP, HIV, UPC 0.44, neg tox screen,  No uremic symptoms. No acute indication of HD at present   #. Anemia of CKD  Lab Results  Component Value Date   HGB 12.2 08/26/2019  Hemoglobin levels are acceptable  #. SHPTH  No results found for: PTH Lab Results  Component Value Date   PHOS 4.7 (H) 07/08/2017  Will obtain PTH tomorrow  #. HTN with CKD Patient  presents with significantly elevated blood pressure and is being admitted to ICU for IV antihypertensives and blood pressure control Has severe LVH per echo 12/2017  #. Diabetes type 2 with CKD Hgb A1c MFr Bld (%)  Date Value  01/22/2018 8.1 (H)  Obtain recent hemoglobin A1c   LOS: 0 Tirsa Gail 8/26/20202:29 [place], [place]

## 2019-08-26 NOTE — Consult Note (Signed)
Name: Renee [name] MRN: [id number] DOB: 1976-05-16    ADMISSION DATE:  08/26/2019 CONSULTATION DATE: 08/26/2019  REFERRING MD : Dr. Margaretmary Eddy   CHIEF COMPLAINT: Chest Heaviness  BRIEF PATIENT DESCRIPTION:  43 yo female admitted with uncontrolled malignant hypertension and acute on chronic renal failure requiring nicardipine gtt   SIGNIFICANT EVENTS/STUDIES:  08/26-Pt admitted to the stepdown unit on nicardipine gtt 08/26-Renal ultrasound revealed no evidence of hydronephrosis. Worsening increased echogenicity of the renal parenchyma compared to 01/24/18 consistent with progressive medical renal disease.  HISTORY OF PRESENT ILLNESS:   This is a 43 yo female with a PMH of HTN, Type II Diabetes Mellitus, Stage IV CKD, and Acute Diastolic CHF.  She presented to [facility] ER on 08/26 with c/o chest heaviness, generalized swelling, and intermittent dizziness onset of symptoms 08/23.  She also reported dyspnea with exertion.  In the ER pt hypertensive bp 236/137 despite compliance with outpatient antihypertensives.  Lab results revealed K+ 3.4, glucose 223, BUN 59, creatinine 5.03, BNP 1,589, troponin 32, and wbc 11.1.  CXR concerning for atelectasis vs. infection and chronic cardiomegaly.  Pt received 10 mg iv metoprolol, however she remained hypertensive and nicardipine gtt initiated.  She was subsequently admitted to the stepdown unit by hospitalist team for additional workup and treatment.    PAST MEDICAL HISTORY :   has a past medical history of CHF (congestive heart failure) ([place]) and Hypertension.  has a past surgical history that includes Cesarean section and Tubal ligation. Prior to Admission medications   Medication Sig Start Date End Date Taking? Authorizing Provider  carvedilol (COREG) 25 MG tablet Take 1 tablet (25 mg total) by mouth 2 (two) times daily with a meal. 02/11/19  Yes Darylene Price A, FNP  cloNIDine (CATAPRES) 0.2 MG tablet Take 1 tablet (0.2 mg total) by mouth 3 (three) times  daily. 07/09/17  Yes Fritzi Mandes, MD  furosemide (LASIX) 40 MG tablet Take 1 tablet (40 mg total) by mouth daily. 01/25/18  Yes Dustin Flock, MD  hydrALAZINE (APRESOLINE) 100 MG tablet Take 100 mg by mouth 4 (four) times daily.   Yes [provider]  isosorbide mononitrate (IMDUR) 30 MG 24 hr tablet Take 1 tablet (30 mg total) by mouth daily for 30 days. 01/30/19 08/26/19 Yes Darylene Price A, FNP  losartan (COZAAR) 100 MG tablet Take 1 tablet (100 mg total) by mouth daily. 02/11/19  Yes Darylene Price A, FNP  spironolactone (ALDACTONE) 25 MG tablet Take 25 mg by mouth daily. 05/08/19  Yes [provider]  triamcinolone cream (KENALOG) 0.1 % Apply 1 application topically 2 (two) times daily. 08/21/19  Yes [provider]  fluticasone (FLONASE) 50 MCG/ACT nasal spray Place 1 spray into both nostrils daily. 01/25/18 02/11/19  Dustin Flock, MD  guaiFENesin-dextromethorphan (ROBITUSSIN DM) 100-10 MG/5ML syrup Take 5 mLs by mouth every 4 (four) hours as needed for cough. Patient not taking: Reported on 08/26/2019 02/02/19   Luvenia Redden, PA-C   No Known Allergies  FAMILY HISTORY:  family history includes Epilepsy in her mother; Hypertension in her father and mother. SOCIAL HISTORY:  reports that she has never smoked. She has never used smokeless tobacco. She reports that she does not drink alcohol or use drugs.  REVIEW OF SYSTEMS: Positives in BOLD  Constitutional: Negative for fever, chills, weight loss, malaise/fatigue and diaphoresis.  HENT: Negative for hearing loss, ear pain, nosebleeds, congestion, sore throat, neck pain, tinnitus and ear discharge.   Eyes: Negative for blurred vision, double vision, photophobia,  pain, discharge and redness.  Respiratory: cough, hemoptysis, sputum production, shortness of breath with exertion, wheezing and stridor.   Cardiovascular: chest heaviness, palpitations, orthopnea, claudication, generalized  swelling and PND.    Gastrointestinal: Negative for heartburn, nausea, vomiting, abdominal pain, diarrhea, constipation, blood in stool and melena.  Genitourinary: Negative for dysuria, urgency, frequency, hematuria and flank pain.  Musculoskeletal: Negative for myalgias, back pain, joint pain and falls.  Skin: Negative for itching and rash.  Neurological: dizziness, tingling, tremors, sensory change, speech change, focal weakness, seizures, loss of consciousness, weakness and headaches.  Endo/Heme/Allergies: Negative for environmental allergies and polydipsia. Does not bruise/bleed easily.  SUBJECTIVE:  c/o intermittent cough  VITAL SIGNS: Temp:  [97.8 F (36.6 C)-98.5 F (36.9 C)] 97.8 F (36.6 C) (08/26 1411) Pulse Rate:  [70-85] 83 (08/26 1500) Resp:  [20-31] 21 (08/26 1411) BP: (170-269)/(76-170) 170/78 (08/26 1500) SpO2:  [89 %-97 %] 93 % (08/26 1500) Weight:  [112.9 kg-117.5 kg] 117.5 kg (08/26 1411)  PHYSICAL EXAMINATION: General: well developed, well nourished female, NAD Neuro: alert and oriented, follows commands  HEENT: supple, no JVD Cardiovascular: nsr, rrr, no R/G Lungs: faint crackles throughout, even, slightly labored with exertion  Abdomen: +BS x4, soft, obese, non tender, non distended  Musculoskeletal: 2+ generalized edema  Skin: intact no rashes or lesions present   Recent Labs  Lab 08/26/19 0451  NA 141  K 3.4*  CL 109  CO2 23  BUN 59*  CREATININE 5.03*  GLUCOSE 223*   Recent Labs  Lab 08/26/19 0451  HGB 12.2  HCT 39.5  WBC 11.1*  PLT 291   Dg Chest 2 View  Result Date: 08/26/2019 CLINICAL DATA:  Chest pain EXAM: CHEST - 2 VIEW COMPARISON:  02/11/2019 FINDINGS: Cardiomegaly. Streaky and hazy density at the right base where there is likely small volume pleural fluid. IMPRESSION: 1. Atelectasis or infection at the right base with small pleural effusion. 2. Chronic cardiomegaly. Electronically Signed   By: Monte Fantasia M.D.   On: 08/26/2019 07:14   US  Renal  Result Date: 08/26/2019 CLINICAL DATA:  43 year old female with acute renal failure EXAM: RENAL / URINARY TRACT ULTRASOUND COMPLETE COMPARISON:  Prior renal ultrasound 01/24/2018 FINDINGS: Right Kidney: Renal measurements: 9.9 x 4.3 x 4.5 cm = volume: 101 mL. Progressive increased renal echogenicity. No mass or hydronephrosis visualized. Left Kidney: Renal measurements: 7.9 x 4.0 x 4.0 cm = volume: 64 mL. Progressive increased renal echogenicity. No mass or hydronephrosis visualized. Bladder: Appears normal for degree of bladder distention. IMPRESSION: 1. No evidence of hydronephrosis. 2. Worsening increased echogenicity of the renal parenchyma compared to 01/24/18 consistent with progressive medical renal disease. Electronically Signed   By: Jacqulynn Cadet M.D.   On: 08/26/2019 16:47    ASSESSMENT / PLAN:  Uncontrolled malignant hypertension  Diastolic CHF  Mildly elevated troponin likely secondary to demand ischemia in setting of hypertension  Continuous telemetry monitoring  Trend troponin's Continue outpatient antihypertensives for bp management, nicardipine gtt if sbp remains >170 despite scheduled antihypertensives  Continue iv lasix  Cardiology consulted appreciate input  Echo pending  Supplemental O2 for dyspnea and/or hypoxia   Acute on chronic renal failure (stage IV CKD) Trend BMP  Replace electrolytes as indicated  Monitor UOP Avoid nephrotoxic medications  Nephrology consulted appreciate input   Mild leukocytosis no obvious signs of infection Trend WBC and monitor fever curve  COVID-19 results pending: maintain droplet and contact precautions for now  Type II Diabetes Mellitus  CBG's ac/hs  SSI  Hemoglobin A1c pending   VTE px: subq heparin   Marda Stalker, [place] Pager [phone] (please enter 7 digits) PCCM Consult Pager [phone] (please enter 7 digits)

## 2019-08-26 NOTE — Consult Note (Signed)
Cardiology Consultation:   Patient ID: Renee [name] MRN: [id number]; DOB: [birth year]  Admit date: 08/26/2019 Date of Consult: 08/26/2019  Primary Care Provider: Minda Ditto, MD Primary Cardiologist:[facility], Dr. Rockey Situ rounding Primary Electrophysiologist:  None    Patient Profile:   Renee [name] is a 43 y.o. 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 Margaretmary Eddy.  History of Present Illness:   Renee [name] is a 43 yo female with PMH as above. Family history includes HTN in mother. PTA medications include coreg, clonidine, lasix, hydralazine, Imdur, losartan, and  spironolactone. However, patient is not familiar with some of the medications listed as PTA medications and indicated that she is not taking them. She reported she does not like her medications as, ever since starting them, she has "not felt herself" and reported fatigue and ntermittent ankle edema that started with the start of her medications. She stated she feels very sleepy with her clonidine TID; therefore, she usually only takes in the AM and PM but often misses her "middle" dose. She takes her BP at home with SBP regularly running 180s. She reported she also followed with Darylene Price for her medications in the past. She follows with Dr. Juleen China with nephrology for her renal insufficiency.  She underwent 01/22/2018 echo showing severe LVH pattern, EF 50 to 55%, G1 DD, and mildly dilated left atrium.  On Sunday 8/23, she reported feeling "ill" with chest pressure/tightness, lower extremity edema, dizziness, orthopnea, PND, SOB, and DOE. She also reported abdominal tightness/distention, as well as difficulty with ambulation due to bilateral LEE. She felt she may have gained some weight. She reported that she would  fall asleep then wake up in "a jittery panic," due to "not being able to breathe." She presented to [facility] ED 8/26. In the ED, BP significant for SBP 236 and DBP 137.  Heart rate 82 bpm.  Oxygen saturation at 96%.  Labs showed leukocytosis, hypokalemia, renal insufficiency.labs were also significant for BNP of 1589.0 with a troponin of 32  33. EKG showed normal sinus rhythm with rate of 81 bpm, left axis deviation, IVCD, left ventricular hypertrophy, and repolarization changes but no acute ST or T changes. Chest x-ray showed atelectasis or infection at the right base with small pleural effusion and chronic cardiomegaly.  She received nicardipine IV, nitroglycerin paste, IV lasix 40mg , and IV lopressor 10mg .  Of note, she was seen in 2018 and 2019 by [facility] cardiology for similar presentation.  Heart Pathway Score:     Past Medical History:  Diagnosis Date   CHF (congestive heart failure) ([place])    Hypertension     Past Surgical History:  Procedure Laterality Date   CESAREAN SECTION     TUBAL LIGATION       Home Medications:  Prior to Admission medications   Medication Sig Start Date End Date Taking? Authorizing Provider  carvedilol (COREG) 25 MG tablet Take 1 tablet (25 mg total) by mouth 2 (two) times daily with a meal. 02/11/19  Yes Darylene Price A, FNP  cloNIDine (CATAPRES) 0.2 MG tablet Take 1 tablet (0.2 mg total) by mouth 3 (three) times daily. 07/09/17  Yes Fritzi Mandes, MD  furosemide (LASIX) 40 MG tablet Take 1 tablet (40 mg total) by mouth daily. 01/25/18  Yes Dustin Flock, MD  hydrALAZINE (APRESOLINE)  100 MG tablet Take 100 mg by mouth 4 (four) times daily.   Yes [provider]  isosorbide mononitrate (IMDUR) 30 MG 24 hr tablet Take 1 tablet (30 mg total) by mouth daily for 30 days. 01/30/19 08/26/19 Yes Darylene Price A, FNP  losartan (COZAAR) 100 MG tablet Take 1 tablet (100 mg total) by mouth daily. 02/11/19  Yes Darylene Price A, FNP  spironolactone (ALDACTONE) 25 MG tablet  Take 25 mg by mouth daily. 05/08/19  Yes [provider]  triamcinolone cream (KENALOG) 0.1 % Apply 1 application topically 2 (two) times daily. 08/21/19  Yes [provider]  fluticasone (FLONASE) 50 MCG/ACT nasal spray Place 1 spray into both nostrils daily. 01/25/18 02/11/19  Dustin Flock, MD  guaiFENesin-dextromethorphan (ROBITUSSIN DM) 100-10 MG/5ML syrup Take 5 mLs by mouth every 4 (four) hours as needed for cough. Patient not taking: Reported on 08/26/2019 02/02/19   Luvenia Redden, PA-C    Inpatient Medications: Scheduled Meds:  nitroGLYCERIN  0.5 inch Topical Q6H   Continuous Infusions:  niCARDipine 9 mg/hr (08/26/19 1124)   PRN Meds:   Allergies:   No Known Allergies  Social History:   Social History   Socioeconomic History   Marital status: Single    Spouse name: Not on file   Number of children: Not on file   Years of education: Not on file   Highest education level: Not on file  Occupational History   Not on file  Social Needs   Financial resource strain: Not hard at all   Food insecurity    Worry: Never true    Inability: Never true   Transportation needs    Medical: No    Non-medical: No  Tobacco Use   Smoking status: Never Smoker   Smokeless tobacco: Never Used  Substance and Sexual Activity   Alcohol use: No   Drug use: No   Sexual activity: Yes  Lifestyle   Physical activity    Days per week: 5 days    Minutes per session: 150+ min   Stress: Only a little  Relationships   Social connections    Talks on phone: More than three times a week    Gets together: More than three times a week    Attends religious service: More than 4 times per year    Active member of club or organization: Yes    Attends meetings of clubs or organizations: 1 to 4 times per year    Relationship status: Separated   Intimate partner violence    Fear of current or ex partner: No    Emotionally abused: No    Physically abused: No     Forced sexual activity: No  Other Topics Concern   Not on file  Social History Narrative   Not on file    Family History:    Family History  Problem Relation Age of Onset   Epilepsy Mother    Hypertension Mother    Hypertension Father      ROS:  Please see the history of present illness.  Review of Systems  Constitutional: Negative for chills and fever.       Weight gain  Eyes: Negative for blurred vision.  Respiratory: Positive for shortness of breath. Negative for hemoptysis.   Cardiovascular: Positive for chest pain, orthopnea, leg swelling and PND.       Abdominal distention  Neurological: Positive for dizziness. Negative for sensory change, speech change and focal weakness.  Psychiatric/Behavioral: The patient is  nervous/anxious.   All other systems reviewed and are negative.   All other ROS reviewed and negative.     Physical Exam/Data:   Vitals:   08/26/19 1115 08/26/19 1138 08/26/19 1145 08/26/19 1215  BP: (!) 194/96 (!) 174/86 (!) 174/77 (!) 171/81  Pulse: 81 85 81 81  Resp: (!) 30 (!) 25 (!) 26   Temp:      TempSrc:      SpO2: 94% 92% 93% 93%  Weight:      Height:        Intake/Output Summary (Last 24 hours) at 08/26/2019 1247 Last data filed at 08/26/2019 1124 Gross per 24 hour  Intake 69.52 ml  Output --  Net 69.52 ml   Last 3 Weights 08/26/2019 02/11/2019 02/11/2019  Weight (lbs) 249 lb 249 lb 249 lb  Weight (kg) 112.946 kg 112.946 kg 112.946 kg     Body mass index is 41.44 kg/m.  General:  Well nourished, obese female in no acute distress. Joined by daughter. HEENT: normal Neck: JVD difficult to assess due to body habitus Vascular: No carotid bruits; radial pulses 2+ bilaterally Cardiac:  normal S1, S2; RRR; no murmur  Lungs: Reduced right base breath sounds, no wheezing, rhonchi or rales  Abd: Somewhat distended, nontender, no hepatomegaly  Ext: non-pitting / woody bilateral lower extremity edema Musculoskeletal:  No deformities, BUE and  BLE strength normal and equal Skin: warm and dry  Neuro:  no focal abnormalities noted Psych:  Normal affect   EKG:  The EKG was personally reviewed and demonstrates:  EKG showed normal sinus rhythm with rate of 81 bpm, left axis deviation, IVCD, left ventricular hypertrophy, and repolarization changes but no acute ST or T changes.  Telemetry:  Telemetry was personally reviewed and demonstrates: SR, 70-80s  Relevant CV Studies: 01/22/2018 echo Left ventricle: Wall thickness was increased in a pattern of   severe LVH. Systolic function was normal. The estimated ejection   fraction was in the range of 50% to 55%. Doppler parameters are   consistent with abnormal left ventricular relaxation (grade 1   diastolic dysfunction). - Left atrium: The atrium was mildly dilated.  Laboratory Data:  High Sensitivity Troponin:   Recent Labs  Lab 08/26/19 0451 08/26/19 0818  TROPONINIHS 32* 33*     Cardiac EnzymesNo results for input(s): TROPONINI in the last 168 hours. No results for input(s): TROPIPOC in the last 168 hours.  Chemistry Recent Labs  Lab 08/26/19 0451  NA 141  K 3.4*  CL 109  CO2 23  GLUCOSE 223*  BUN 59*  CREATININE 5.03*  CALCIUM 8.8*  GFRNONAA 10*  GFRAA 11*  ANIONGAP 9    Recent Labs  Lab 08/26/19 0451  PROT 7.0  ALBUMIN 3.3*  AST 13*  ALT 19  ALKPHOS 126  BILITOT 0.8   Hematology Recent Labs  Lab 08/26/19 0451  WBC 11.1*  RBC 4.86  HGB 12.2  HCT 39.5  MCV 81.3  MCH 25.1*  MCHC 30.9  RDW 17.8*  PLT 291   BNP Recent Labs  Lab 08/26/19 0451  BNP 1,589.0*    DDimer No results for input(s): DDIMER in the last 168 hours.   Radiology/Studies:  Dg Chest 2 View  Result Date: 08/26/2019 CLINICAL DATA:  Chest pain EXAM: CHEST - 2 VIEW COMPARISON:  02/11/2019 FINDINGS: Cardiomegaly. Streaky and hazy density at the right base where there is likely small volume pleural fluid. IMPRESSION: 1. Atelectasis or infection at the right base with small  pleural effusion. 2. Chronic cardiomegaly. Electronically Signed   By: Monte Fantasia M.D.   On: 08/26/2019 07:14    Assessment and Plan:   Malignant HTN, poorly controlled --Reported intermittent compliance with PTA medications, as many of the medications make her feel poorly with fatigue and intermittent ankle edema.  As above, she takes her clonidine in the a.m. and p.m., but she frequently skips the middle dose.  She also is not aware of some of the medications listed on her current medication list, such as spironolactone.   --Home SBP in 180s. ED BP with SBP 200s. Patient had yet to receive any of her morning PTA medications of note at the time of this initial reading.  Checking TSH.   --01/24/2018 US renal did not show stenoses but did note increased renal echogenicity. --Restarted home antihypertensive medications with some adjustments, given ARB and spironolactone contraindicated with current renal function. Patient taking Clonidine at BID frequency at home; therefore, changed current Clonidine to 0.2 mg BID for consistency. Adjusted hydralazine 100 mg to TID (previously QID frequency). Increased to Imdur 60 mg twice daily. Continue these medications with PTA Coreg 25 mg twice daily. Continue IV Cardene for now.  Pending echo/EF assessment and BP after restarting adjusted antihypertensive medications.  If BP still suboptimal after these adjusted medications are restarted, and per MD, may need to consider starting patient on oral and long acting verapamil in the future. Recommend establish with [facility] / consistent outpatient cardiology follow-up at discharge for continued optimization of medical management / monitoring.  HFpEF, EF 50-55% --Patient reported intermittent ankle edema, which she attributed to her medications; however, physical exam consistent with volume overload. Also reporting ongoing orthopnea. Etiology of worsening heart failure likely multifactorial, including poorly controlled BP  (and intermittent compliance with clonidine, likely with rebound HTN). Consider also flash pulmonary edema in the setting of poorly controlled comorbid conditions and intermittent medication compliance.  --Continue diuresis with IV lasix 40 mg BID for now.  Titrate IV Lasix as needed for optimal urine output or until patient is euvolemic on exam.  Daily standing weights, continue to monitor I's/O's.  Last weight 117.5 kg. --Continue antihypertensives as above with PTA Coreg. --Daily BMET.  Monitor renal function with nephrology consulted.  --Further volume management could be managed by HD and to be determined by nephrology if felt patient now has reached ESRD given progression in Cr. --Echo pending. Previous echo with normal EF, severe LVH as above. --No ACE/ARB/ARNI/spiro due to renal function.   --Recommend establish with [facility] as outpatient at discharge as above.  Hypokalemia --Replete with goal 4.0.  Check Mg. --Carefully monitor electrolytes, given current renal function.  --AM BMET.  AOCKD IV/V, possible ESRD --Nephrology consulted.  If felt that patient has progressed to end-stage renal disease, as above, could consider volume management with hemodialysis in the future. --Renal function likely worsened in the setting of uncontrolled blood pressure and diabetes.   --Daily BMET recommended.  Cr currently 5.03 (previous Cr 3.26 01/2019). --Daily CBC recommended, given known anemia of chronic disease. --Avoid nephrotoxins/contrast procedures.  No ACE/ARB/ARNI/spiro due to renal function.  DM2 --SSI, per IM  Morbid obesity --Lifestyle changes recommended     For questions or updates, please contact [facility] Please consult [email or web address] for contact info under     Signed, Arvil Chaco, PA-C  08/26/2019 12:47 PM

## 2019-08-26 NOTE — ED Triage Notes (Signed)
Pt in with co chest heaviness and swelling all over since Sunday. Co of feeling abd pressure, and dizziness at times. Hx of pneumonia 2 years ago but symptoms were different.

## 2019-08-26 NOTE — ED Notes (Signed)
ED TO INPATIENT HANDOFF REPORT  ED Nurse Name and Phone #: Janett Billow [phone]  S Name/Age/Gender Renee [name] 43 y.o. female Room/Bed: ED09A/ED09A  Code Status   Code Status: Prior  Home/SNF/Other Home Patient oriented to: self, place, time and situation Is this baseline? Yes   Triage Complete: Triage complete  Chief Complaint Shortness of Breath   Triage Note Pt in with co chest heaviness and swelling all over since Sunday. Co of feeling abd pressure, and dizziness at times. Hx of pneumonia 2 years ago but symptoms were different.    Allergies No Known Allergies  Level of Care/Admitting Diagnosis ED Disposition    ED Disposition Condition [facility] Area: [place] [[id number]]  Level of Care: Stepdown [14]  Covid Evaluation: Person Under Investigation (PUI)  Diagnosis: HTN (hypertension), malignant [id number]  Admitting Physician: Nicholes Mango [[id number]]  Attending Physician: Nicholes Mango [[id number]]  Estimated length of stay: 3 - 4 days  Certification:: I certify this patient will need inpatient services for at least 2 midnights  PT Class (Do Not Modify): Inpatient [101]  PT Acc Code (Do Not Modify): Private [1]       B Medical/Surgery History Past Medical History:  Diagnosis Date  . CHF (congestive heart failure) ([place])   . Hypertension    Past Surgical History:  Procedure Laterality Date  . CESAREAN SECTION    . TUBAL LIGATION       A IV Location/Drains/Wounds Patient Lines/Drains/Airways Status   Active Line/Drains/Airways    Name:   Placement date:   Placement time:   Site:   Days:   Peripheral IV 08/26/19 Left Antecubital   08/26/19    0824    Antecubital   less than 1          Intake/Output Last 24 hours  Intake/Output Summary (Last 24 hours) at 08/26/2019 1322 Last data filed at 08/26/2019 1301 Gross per 24 hour  Intake 201.19 ml  Output -  Net 201.19 ml    Labs/Imaging Results for orders placed or performed  during the hospital encounter of 08/26/19 (from the past 48 hour(s))  CBC     Status: Abnormal   Collection Time: 08/26/19  4:51 AM  Result Value Ref Range   WBC 11.1 (H) 4.0 - 10.5 K/uL   RBC 4.86 3.87 - 5.11 MIL/uL   Hemoglobin 12.2 12.0 - 15.0 g/dL   HCT 39.5 36.0 - 46.0 %   MCV 81.3 80.0 - 100.0 fL   MCH 25.1 (L) 26.0 - 34.0 pg   MCHC 30.9 30.0 - 36.0 g/dL   RDW 17.8 (H) 11.5 - 15.5 %   Platelets 291 150 - 400 K/uL   nRBC 0.0 0.0 - 0.2 %    Comment: Performed at [facility], [address] [place], [place] [address]  Comprehensive metabolic panel     Status: Abnormal   Collection Time: 08/26/19  4:51 AM  Result Value Ref Range   Sodium 141 135 - 145 mmol/L   Potassium 3.4 (L) 3.5 - 5.1 mmol/L   Chloride 109 98 - 111 mmol/L   CO2 23 22 - 32 mmol/L   Glucose, Bld 223 (H) 70 - 99 mg/dL   BUN 59 (H) 6 - 20 mg/dL   Creatinine, Ser 5.03 (H) 0.44 - 1.00 mg/dL   Calcium 8.8 (L) 8.9 - 10.3 mg/dL   Total Protein 7.0 6.5 - 8.1 g/dL   Albumin 3.3 (L) 3.5 - 5.0 g/dL  AST 13 (L) 15 - 41 U/L   ALT 19 0 - 44 U/L   Alkaline Phosphatase 126 38 - 126 U/L   Total Bilirubin 0.8 0.3 - 1.2 mg/dL   GFR calc non Af Amer 10 (L) >60 mL/min   GFR calc Af Amer 11 (L) >60 mL/min   Anion gap 9 5 - 15    Comment: Performed at [facility], [place], [place] [address]  Troponin I (High Sensitivity)     Status: Abnormal   Collection Time: 08/26/19  4:51 AM  Result Value Ref Range   Troponin I (High Sensitivity) 32 (H) <18 ng/L    Comment: (NOTE) Elevated high sensitivity troponin I (hsTnI) values and significant  changes across serial measurements may suggest ACS but many other  chronic and acute conditions are known to elevate hsTnI results.  Refer to the "Links" section for chest pain algorithms and additional  guidance. Performed at [facility], [address] [place], [place] [address]   Brain natriuretic peptide     Status: Abnormal    Collection Time: 08/26/19  4:51 AM  Result Value Ref Range   B Natriuretic Peptide 1,589.0 (H) 0.0 - 100.0 pg/mL    Comment: Performed at [facility], [place], [place] [address]  Troponin I (High Sensitivity)     Status: Abnormal   Collection Time: 08/26/19  8:18 AM  Result Value Ref Range   Troponin I (High Sensitivity) 33 (H) <18 ng/L    Comment: (NOTE) Elevated high sensitivity troponin I (hsTnI) values and significant  changes across serial measurements may suggest ACS but many other  chronic and acute conditions are known to elevate hsTnI results.  Refer to the "Links" section for chest pain algorithms and additional  guidance. Performed at [facility], [address] [place], [place] [address]    Dg Chest 2 View  Result Date: 08/26/2019 CLINICAL DATA:  Chest pain EXAM: CHEST - 2 VIEW COMPARISON:  02/11/2019 FINDINGS: Cardiomegaly. Streaky and hazy density at the right base where there is likely small volume pleural fluid. IMPRESSION: 1. Atelectasis or infection at the right base with small pleural effusion. 2. Chronic cardiomegaly. Electronically Signed   By: Monte Fantasia M.D.   On: 08/26/2019 07:14    Pending Labs Unresulted Labs (From admission, onward)    Start     Ordered   08/26/19 1141  Hemoglobin A1c  Add-on,   AD     08/26/19 1140   08/26/19 0858  Novel Coronavirus, NAA (Hosp order, Send-out to Ref Lab; TAT 18-24 hrs  (Symptomatic/High Risk of Exposure/Tier 1 Patients Labs with Precautions)  Once,   STAT    Question Answer Comment  Is this test for diagnosis or screening Diagnosis of ill patient   Symptomatic for COVID-19 as defined by CDC Yes   Date of Symptom Onset 08/24/2019   Hospitalized for COVID-19 Yes   Admitted to ICU for COVID-19 No   Previously tested for COVID-19 No   Resident in a congregate (group) care setting No   Employed in healthcare setting No   Pregnant No      08/26/19 0857           Vitals/Pain Today's Vitals   08/26/19 1138 08/26/19 1145 08/26/19 1215 08/26/19 1321  BP: (!) 174/86 (!) 174/77 (!) 171/81 (!) 195/76  Pulse: 85 81 81 85  Resp: (!) 25 (!) 26  (!) 22  Temp:  TempSrc:      SpO2: 92% 93% 93% 97%  Weight:      Height:      PainSc:        Isolation Precautions Airborne and Contact precautions  Medications Medications  nicardipine (CARDENE) 20mg  in 0.86% saline 233ml IV infusion (0.1 mg/ml) (9 mg/hr Intravenous Rate/Dose Verify 08/26/19 1301)  potassium chloride SA (K-DUR) CR tablet 30 mEq (has no administration in time range)  carvedilol (COREG) tablet 25 mg (has no administration in time range)  isosorbide mononitrate (IMDUR) 24 hr tablet 30 mg (has no administration in time range)  losartan (COZAAR) tablet 100 mg (has no administration in time range)  furosemide (LASIX) injection 40 mg (has no administration in time range)  cloNIDine (CATAPRES) tablet 0.2 mg (has no administration in time range)  spironolactone (ALDACTONE) tablet 50 mg (has no administration in time range)  furosemide (LASIX) injection 40 mg (40 mg Intravenous Given 08/26/19 0845)  promethazine (PHENERGAN) injection 12.5 mg (12.5 mg Intravenous Given 08/26/19 0908)  metoprolol tartrate (LOPRESSOR) injection 10 mg (10 mg Intravenous Given 08/26/19 0946)    Mobility walks Low fall risk   Focused Assessments Cardiac Assessment Handoff:  Cardiac Rhythm: Normal sinus rhythm Lab Results  Component Value Date   TROPONINI 0.04 ([place]) 02/11/2019   No results found for: DDIMER Does the Patient currently have chest pain? No   HTN urgency with AKI   R Recommendations: See Admitting Provider Note  Report given to:   Additional Notes:

## 2019-08-26 NOTE — ED Notes (Signed)
Pt up to restroom independently. Pt dry heaving x2 and c/o upset stomach.

## 2019-08-26 NOTE — ED Notes (Signed)
Pt reports urinating more than normal today as well with no previous hx of diabetes except in pregnancy.

## 2019-08-27 ENCOUNTER — Inpatient Hospital Stay (HOSPITAL_COMMUNITY)
Admit: 2019-08-27 | Discharge: 2019-08-27 | Disposition: A | Discharge status: Home / Self Care | Payer: Medicare Other | Attending: Physician Assistant | Admitting: Physician Assistant

## 2019-08-27 DIAGNOSIS — N185 Chronic kidney disease, stage 5: Secondary | ICD-10-CM

## 2019-08-27 DIAGNOSIS — I361 Nonrheumatic tricuspid (valve) insufficiency: Secondary | ICD-10-CM

## 2019-08-27 DIAGNOSIS — J810 Acute pulmonary edema: Secondary | ICD-10-CM

## 2019-08-27 DIAGNOSIS — N179 Acute kidney failure, unspecified: Secondary | ICD-10-CM

## 2019-08-27 DIAGNOSIS — I340 Nonrheumatic mitral (valve) insufficiency: Secondary | ICD-10-CM

## 2019-08-27 LAB — CBC WITH DIFFERENTIAL/PLATELET
Abs Immature Granulocytes: 0.04 10*3/uL (ref 0.00–0.07)
Basophils Absolute: 0.1 10*3/uL (ref 0.0–0.1)
Basophils Relative: 1 %
Eosinophils Absolute: 0.2 10*3/uL (ref 0.0–0.5)
Eosinophils Relative: 2 %
HCT: 35.9 % — ABNORMAL LOW (ref 36.0–46.0)
Hemoglobin: 11.2 g/dL — ABNORMAL LOW (ref 12.0–15.0)
Immature Granulocytes: 0 %
Lymphocytes Relative: 12 %
Lymphs Abs: 1.3 10*3/uL (ref 0.7–4.0)
MCH: 24.9 pg — ABNORMAL LOW (ref 26.0–34.0)
MCHC: 31.2 g/dL (ref 30.0–36.0)
MCV: 80 fL (ref 80.0–100.0)
Monocytes Absolute: 0.6 10*3/uL (ref 0.1–1.0)
Monocytes Relative: 6 %
Neutro Abs: 8.3 10*3/uL — ABNORMAL HIGH (ref 1.7–7.7)
Neutrophils Relative %: 79 %
Platelets: 282 10*3/uL (ref 150–400)
RBC: 4.49 MIL/uL (ref 3.87–5.11)
RDW: 17.9 % — ABNORMAL HIGH (ref 11.5–15.5)
WBC: 10.5 10*3/uL (ref 4.0–10.5)
nRBC: 0 % (ref 0.0–0.2)

## 2019-08-27 LAB — RENAL FUNCTION PANEL
Albumin: 3 g/dL — ABNORMAL LOW (ref 3.5–5.0)
Anion gap: 10 (ref 5–15)
BUN: 62 mg/dL — ABNORMAL HIGH (ref 6–20)
CO2: 21 mmol/L — ABNORMAL LOW (ref 22–32)
Calcium: 8.7 mg/dL — ABNORMAL LOW (ref 8.9–10.3)
Chloride: 109 mmol/L (ref 98–111)
Creatinine, Ser: 5.16 mg/dL — ABNORMAL HIGH (ref 0.44–1.00)
GFR calc Af Amer: 11 mL/min — ABNORMAL LOW (ref >60)
GFR calc non Af Amer: 9 mL/min — ABNORMAL LOW (ref >60)
Glucose, Bld: 162 mg/dL — ABNORMAL HIGH (ref 70–99)
Phosphorus: 4.4 mg/dL (ref 2.5–4.6)
Potassium: 3.9 mmol/L (ref 3.5–5.1)
Sodium: 140 mmol/L (ref 135–145)

## 2019-08-27 LAB — GLUCOSE, CAPILLARY
Glucose-Capillary: 160 mg/dL — ABNORMAL HIGH (ref 70–99)
Glucose-Capillary: 150 mg/dL — ABNORMAL HIGH (ref 70–99)
Glucose-Capillary: 161 mg/dL — ABNORMAL HIGH (ref 70–99)
Glucose-Capillary: 132 mg/dL — ABNORMAL HIGH (ref 70–99)

## 2019-08-27 LAB — NOVEL CORONAVIRUS, NAA (HOSP ORDER, SEND-OUT TO REF LAB; TAT 18-24 HRS): SARS-CoV-2, NAA: NOT DETECTED

## 2019-08-27 LAB — ECHOCARDIOGRAM COMPLETE
Height: 65 in
Weight: 4144.65 oz

## 2019-08-27 MED ORDER — AMLODIPINE BESYLATE 5 MG PO TABS
5.0000 mg | ORAL_TABLET | Freq: Every day | ORAL | Status: DC
  Administered 2019-08-27 – 2019-08-28 (×2): 5 mg via ORAL
  Filled 2019-08-27 (×2): qty 1

## 2019-08-27 NOTE — Progress Notes (Signed)
[facility] [place] NAME: Renee [name]    MR#:  [id number]  DATE OF BIRTH:  May 11, 1976  SUBJECTIVE:  CHIEF COMPLAINT: Patient is feeling much better, lower extremity swelling is getting better.  Chest discomfort resolved  REVIEW OF SYSTEMS:  CONSTITUTIONAL: No fever, fatigue or weakness.  EYES: No blurred or double vision.  EARS, NOSE, AND THROAT: No tinnitus or ear pain.  RESPIRATORY: No cough, shortness of breath, wheezing or hemoptysis.  CARDIOVASCULAR: No chest pain, orthopnea, edema.  GASTROINTESTINAL: No nausea, vomiting, diarrhea or abdominal pain.  GENITOURINARY: No dysuria, hematuria.  ENDOCRINE: No polyuria, nocturia,  HEMATOLOGY: No anemia, easy bruising or bleeding SKIN: Leg swelling MUSCULOSKELETAL: No joint pain or arthritis.   NEUROLOGIC: No tingling, numbness, weakness.  PSYCHIATRY: No anxiety or depression.   DRUG ALLERGIES:  No Known Allergies  VITALS:  Blood pressure (!) 151/81, pulse 75, temperature (!) 97.4 F (36.3 C), temperature source Oral, resp. rate (!) 0, height 5\' 5"  (1.651 m), weight 117.5 kg, SpO2 92 %.  PHYSICAL EXAMINATION:  GENERAL:  43 y.o.-year-old patient lying in the bed with no acute distress.  EYES: Pupils equal, round, reactive to light and accommodation. No scleral icterus. Extraocular muscles intact.  HEENT: Head atraumatic, normocephalic. Oropharynx and nasopharynx clear.  NECK:  Supple, no jugular venous distention. No thyroid enlargement, no tenderness.  LUNGS: Normal breath sounds bilaterally, no wheezing, rales,rhonchi or crepitation. No use of accessory muscles of respiration.  CARDIOVASCULAR: S1, S2 normal. No murmurs, rubs, or gallops.  ABDOMEN: Soft, nontender, nondistended. Bowel sounds present.  EXTREMITIES: Significant pedal edema, no cyanosis, or clubbing.  NEUROLOGIC: Cranial nerves II through XII are intact. Muscle strength 5/5 in all extremities. Sensation intact.  Gait not checked.  PSYCHIATRIC: The patient is alert and oriented x 3.  SKIN: No obvious rash, lesion, or ulcer.    LABORATORY PANEL:   CBC Recent Labs  Lab 08/27/19 0602  WBC 10.5  HGB 11.2*  HCT 35.9*  PLT 282   ------------------------------------------------------------------------------------------------------------------  Chemistries  Recent Labs  Lab 08/26/19 0451 08/26/19 0818 08/27/19 0602  NA 141  --  140  K 3.4*  --  3.9  CL 109  --  109  CO2 23  --  21*  GLUCOSE 223*  --  162*  BUN 59*  --  62*  CREATININE 5.03*  --  5.16*  CALCIUM 8.8*  --  8.7*  MG  --  2.4  --   AST 13*  --   --   ALT 19  --   --   ALKPHOS 126  --   --   BILITOT 0.8  --   --    ------------------------------------------------------------------------------------------------------------------  Cardiac Enzymes No results for input(s): TROPONINI in the last 168 hours. ------------------------------------------------------------------------------------------------------------------  RADIOLOGY:  Dg Chest 2 View  Result Date: 08/26/2019 CLINICAL DATA:  Chest pain EXAM: CHEST - 2 VIEW COMPARISON:  02/11/2019 FINDINGS: Cardiomegaly. Streaky and hazy density at the right base where there is likely small volume pleural fluid. IMPRESSION: 1. Atelectasis or infection at the right base with small pleural effusion. 2. Chronic cardiomegaly. Electronically Signed   By: Monte Fantasia M.D.   On: 08/26/2019 07:14   US Renal  Result Date: 08/26/2019 CLINICAL DATA:  43 year old female with acute renal failure EXAM: RENAL / URINARY TRACT ULTRASOUND COMPLETE COMPARISON:  Prior renal ultrasound 01/24/2018 FINDINGS: Right Kidney: Renal measurements: 9.9 x 4.3 x 4.5 cm = volume: 101 mL. Progressive increased  renal echogenicity. No mass or hydronephrosis visualized. Left Kidney: Renal measurements: 7.9 x 4.0 x 4.0 cm = volume: 64 mL. Progressive increased renal echogenicity. No mass or hydronephrosis  visualized. Bladder: Appears normal for degree of bladder distention. IMPRESSION: 1. No evidence of hydronephrosis. 2. Worsening increased echogenicity of the renal parenchyma compared to 01/24/18 consistent with progressive medical renal disease. Electronically Signed   By: Jacqulynn Cadet M.D.   On: 08/26/2019 16:47    EKG:   Orders placed or performed during the hospital encounter of 08/26/19  . EKG 12-Lead  . 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  No uremic symptoms no acute indication for hemodialysis at this point Renal ultrasound with no evidence of hydronephrosis and worsening of renal parenchyma compared to January 24, 2018 consistent with progressive medical renal disease  # Acute on chronic CHF  on IV Lasix 40 mg twice a day,  spironolactone Echocardiogram- preliminary report LVH with grossly normal EF Intake and output and monitor daily weights Cardiology consult placed and notified to Dr. Rockey Situ  #Hypokalemia Repleted and recheck potassium at 3.9  #Morbid obesity Advised lifestyle modification     All the records are reviewed and case discussed with Care Management/Social Workerr. Management plans discussed with the patient, family and they are in agreement.  CODE STATUS: fc .   Husband and sister rate are the healthcare power of attorney  TOTAL TIME TAKING CARE OF THIS PATIENT: 37 minutes.   POSSIBLE D/C IN 2  DAYS, DEPENDING ON CLINICAL CONDITION.  Note: This dictation was prepared with Dragon dictation along with smaller phrase technology. Any transcriptional errors that result from this process are unintentional.   Nicholes Mango M.D on 08/27/2019 at 4:07 PM  Between 7am to 6pm - Pager - [phone] After 6pm go to [email or web address] - password [email or web address] [facility]  Office  [phone]  CC: Primary care physician; Minda Ditto, MD

## 2019-08-27 NOTE — Progress Notes (Signed)
Progress Note  Patient Name: Renee [name] Date of Encounter: 08/27/2019  Primary Cardiologist: Ida Rogue, MD   Subjective   Patient upset this AM, having just learned that her renal function has worsened and may be end stage with indication for HD in the near future.  No chest pain, SOB, palpitations, racing HR. No HA with BP 151/81.  Inpatient Medications    Scheduled Meds: . carvedilol  25 mg Oral BID WC  . Chlorhexidine Gluconate Cloth  6 each Topical Daily  . cloNIDine  0.2 mg Oral BID  . furosemide  40 mg Intravenous BID  . heparin  5,000 Units Subcutaneous Q8H  . hydrALAZINE  100 mg Oral TID  . insulin aspart  0-5 Units Subcutaneous QHS  . insulin aspart  0-9 Units Subcutaneous TID WC  . isosorbide mononitrate  60 mg Oral BID   Continuous Infusions: . niCARDipine 3 mg/hr (08/27/19 0925)   PRN Meds: guaiFENesin-codeine   Vital Signs    Vitals:   08/27/19 0600 08/27/19 0700 08/27/19 0800 08/27/19 0915  BP: (!) 160/76 132/74  (!) 151/81  Pulse:      Resp: (!) 22 (!) 21 (!) 24   Temp:   97.8 F (36.6 C)   TempSrc:   Oral   SpO2:      Weight:      Height:        Intake/Output Summary (Last 24 hours) at 08/27/2019 0937 Last data filed at 08/27/2019 0925 Gross per 24 hour  Intake 995.74 ml  Output -  Net 995.74 ml   Last 3 Weights 08/26/2019 08/26/2019 02/11/2019  Weight (lbs) 259 lb 0.7 oz 249 lb 249 lb  Weight (kg) 117.5 kg 112.946 kg 112.946 kg      Telemetry    SR, 70s - Personally Reviewed  ECG    No new tracings- Personally Reviewed  Physical Exam   GEN: No acute distress.  Frustrated but pleasant. Neck: JVD difficult to assess due to body habitus Cardiac: RRR, no murmurs, rubs, or gallops.  Respiratory: Distant breath sounds bilaterally GI: Soft, nontender, non-distended  MS: bilateral moderate lower extremity edema; No deformity. Neuro:  Nonfocal  Psych: Normal affect, frustrated with ongoing BP issues   Labs    High  Sensitivity Troponin:   Recent Labs  Lab 08/26/19 0451 08/26/19 0818  TROPONINIHS 32* 33*      Cardiac EnzymesNo results for input(s): TROPONINI in the last 168 hours. No results for input(s): TROPIPOC in the last 168 hours.   Chemistry Recent Labs  Lab 08/26/19 0451 08/27/19 0602  NA 141 140  K 3.4* 3.9  CL 109 109  CO2 23 21*  GLUCOSE 223* 162*  BUN 59* 62*  CREATININE 5.03* 5.16*  CALCIUM 8.8* 8.7*  PROT 7.0  --   ALBUMIN 3.3* 3.0*  AST 13*  --   ALT 19  --   ALKPHOS 126  --   BILITOT 0.8  --   GFRNONAA 10* 9*  GFRAA 11* 11*  ANIONGAP 9 10     Hematology Recent Labs  Lab 08/26/19 0451 08/27/19 0602  WBC 11.1* 10.5  RBC 4.86 4.49  HGB 12.2 11.2*  HCT 39.5 35.9*  MCV 81.3 80.0  MCH 25.1* 24.9*  MCHC 30.9 31.2  RDW 17.8* 17.9*  PLT 291 282    BNP Recent Labs  Lab 08/26/19 0451  BNP 1,589.0*     DDimer No results for input(s): DDIMER in the last 168 hours.   Radiology  Dg Chest 2 View  Result Date: 08/26/2019 CLINICAL DATA:  Chest pain EXAM: CHEST - 2 VIEW COMPARISON:  02/11/2019 FINDINGS: Cardiomegaly. Streaky and hazy density at the right base where there is likely small volume pleural fluid. IMPRESSION: 1. Atelectasis or infection at the right base with small pleural effusion. 2. Chronic cardiomegaly. Electronically Signed   By: Monte Fantasia M.D.   On: 08/26/2019 07:14   US Renal  Result Date: 08/26/2019 CLINICAL DATA:  43 year old female with acute renal failure EXAM: RENAL / URINARY TRACT ULTRASOUND COMPLETE COMPARISON:  Prior renal ultrasound 01/24/2018 FINDINGS: Right Kidney: Renal measurements: 9.9 x 4.3 x 4.5 cm = volume: 101 mL. Progressive increased renal echogenicity. No mass or hydronephrosis visualized. Left Kidney: Renal measurements: 7.9 x 4.0 x 4.0 cm = volume: 64 mL. Progressive increased renal echogenicity. No mass or hydronephrosis visualized. Bladder: Appears normal for degree of bladder distention. IMPRESSION: 1. No evidence  of hydronephrosis. 2. Worsening increased echogenicity of the renal parenchyma compared to 01/24/18 consistent with progressive medical renal disease. Electronically Signed   By: Jacqulynn Cadet M.D.   On: 08/26/2019 16:47    Cardiac Studies   Pending echo  Patient Profile     43 y.o. female hx of hypertension, chronic diastolic heart failure with ejection fraction of 60 to 65% (06/2017), chronic renal insufficiency, intermittent medication compliance, and who is being seen today for the evaluation of malignant HTN and HFpEF.  Assessment & Plan    Malignant HTN, poorly controlled --BP improved with restart of home medications. Home SBP in 180s. ED BP with SBP 200s. 01/24/2018 US renal did not show stenoses but did note increased renal echogenicity. Nephrology consultation.  --ARB and spironolactone contraindicated with current renal function and will defer to restart per nephrology. --Continue Clonidine 0.2 mg BID, hydralazine 100 mg to TID, Imdur 60 mg twice daily, Coreg 25 mg twice daily. Continue IV Cardene for now. With wean of drip, consider long acting verapamil in the future.  --Pending echo/EF   --Recommend establish with [facility] / consistent outpatient cardiology follow-up at discharge for continued optimization of medical management / monitoring.   HFpEF, EF 50-55% --Improved LEE and abdominal distention.  --Continue diuresis with IV lasix 40 mg BID for now.  Titrate IV Lasix as needed for optimal urine output or until patient is euvolemic on exam.  Daily standing weights, continue to monitor I's/O's.  Last weight 117.5 kg. Continue medical management as above. No ACE/ARB/ARNI/spiro due to renal function with restart per nephrology.  Daily BMET.   --Nephrology to determine if ESRD with need for HD in near future, which will assist with volume management. Recommend establish with [facility] as outpatient at discharge as above.  AOCKD IV/V, possible ESRD --Nephrology consulted.  If felt  that patient has progressed to end-stage renal disease, as above, could consider volume management with hemodialysis in the future. --Renal function likely worsened in the setting of uncontrolled blood pressure and diabetes.   --Daily BMET recommended.  Cr currently 5.03  5.16 (previous Cr 3.26 01/2019). --Daily CBC recommended, given known anemia of chronic disease. --Avoid nephrotoxins/contrast procedures.  No ACE/ARB/ARNI/spiro due to renal function.  DM2 --SSI, per IM  Morbid obesity --Lifestyle changes recommended  For questions or updates, please contact [facility] Please consult [email or web address] for contact info under        Signed, Arvil Chaco, PA-C  08/27/2019, 9:37 AM

## 2019-08-27 NOTE — Progress Notes (Signed)
Initial Nutrition Assessment  RD working remotely.  DOCUMENTATION CODES:   Morbid obesity  INTERVENTION:  Patient reports good appetite and PO intake and that she does not need an oral nutrition supplement at this time. Will continue to monitor.  Reviewed "Carbohydrate Counting for People with Diabetes" and "Low Sodium Nutrition Therapy" handouts from the Academy of [employer] with patient over the phone. Will mail handouts to patient. Discussed different food groups and their effects on blood sugar, emphasizing carbohydrate-containing foods. Provided list of carbohydrates and recommended serving sizes of common foods. Discussed importance of controlled and consistent carbohydrate intake throughout the day. Provided examples of ways to balance meals/snacks and encouraged intake of high-fiber, whole grain complex carbohydrates. Reviewed patient's dietary recall. Provided examples on ways to decrease sodium intake in diet. Discouraged intake of processed foods and use of salt shaker. Encouraged fresh fruits and vegetables as well as whole grain sources of carbohydrates to maximize fiber intake. RD discussed why it is important for patient to adhere to diet recommendations, and emphasized the role of fluids, foods to avoid, and importance of weighing self daily. Teach back method used. Expect fair compliance.  NUTRITION DIAGNOSIS:   Food and nutrition related knowledge deficit related to limited prior education as evidenced by per patient/family report.  GOAL:   Patient will meet greater than or equal to 90% of their needs  MONITOR:   PO intake, Labs, Weight trends, Skin, I & O's  REASON FOR ASSESSMENT:   Consult Assessment of nutrition requirement/status  ASSESSMENT:   43 year old female with PMHx of HTN, DM, CHF, CKD admitted with uncontrolled malignant HTN and acute on chronic renal failure.   Spoke with patient over the phone. She reports her appetite is good. She  reports she typically eats 2-3 meals per day. She usually skips breakfast and eats in the late morning. She has a sandwich and drink at that meal. She has a snack in the afternoon. Dinner is usually chicken with cabbage or another vegetable. Patient seemed embarrassed about her intake and did not want to share what she typically has to drink or for her snacks. She only reports it's "things she shouldn't eat." Discussed that it is okay to share so we can discuss other options patient may also enjoy. Patient has not previously received education on DM. Reviewed handout with patient over the phone and will mail to her. Also discussed limiting sodium intake to preserve kidney function and in setting of CHF. Potassium and phosphorus are WNL at this time. Patient reports she has a good appetite and feels she eats enough protein so she reports she does not need an ONS at this time.  Patient denies any unintentional weight loss. She is currently 117.5 kg (259.04 lbs) which appears to be about 4-5 kg higher than usual weight in chart, likely related to fluid retention. Currently documented to have deep pitting edema to bilateral lower extremities.  Medications reviewed and include: carvedilol, Lasix 40 mg BID IV, Novolog 0-9 units TID, Novolog 0-5 units QHS, Cardene gtt.  Labs reviewed: CBG 160-190, CO2 21, BUN 62, Creatinine 5.16, Albumin 3. HgbA1c 7.5 on 8/26.  Albumin has a half-life of 21 days and is strongly affected by stress response and inflammatory process. When a patient presents with low albumin, it is likely skewed due to the acute inflammatory response. As patient reports good PO intake and that she is weight-stable, RD does not suspect patient is malnourished.  NUTRITION - FOCUSED PHYSICAL EXAM:  Unable to complete at this time.  Diet Order:   Diet Order            Diet heart healthy/carb modified Room service appropriate? Yes; Fluid consistency: Thin  Diet effective now              EDUCATION NEEDS:   Education needs have been addressed  Skin:  Skin Assessment: Reviewed RN Assessment  Last BM:  Unknown  Height:   Ht Readings from Last 1 Encounters:  08/26/19 5\' 5"  (1.651 m)   Weight:   Wt Readings from Last 1 Encounters:  08/26/19 117.5 kg   Ideal Body Weight:  56.8 kg  BMI:  Body mass index is 43.11 kg/m.  Estimated Nutritional Needs:   Kcal:  2100-2300  Protein:  105-115 grams  Fluid:  2.1-2.3 L/day  Willey Blade, MS, RD, LDN Office: [phone] Pager: [phone] After Hours/Weekend Pager: [phone]

## 2019-08-27 NOTE — Progress Notes (Signed)
[place], [place] 08/27/19  Subjective:   LOS: 1 08/26 0701 - 08/27 0700 In: 902.3 [I.V.:902.3] Out: -  Patient presented to the emergency room today with chest heaviness and swelling all over since Sunday.  Feeling abdominal pressure and dizziness.  Blood pressure at presentation was 264/159 now on cardene drip in ICU- which is intermittently being discontinued  Has LE edema Getting Iv lasix for diuresis and responding well   Objective:  Vital signs in last 24 hours:  Temp:  [97.4 F (36.3 C)-98.1 F (36.7 C)] 97.4 F (36.3 C) (08/27 1400) Pulse Rate:  [67-76] 67 (08/27 1625) Resp:  [0-27] 0 (08/27 1500) BP: (132-175)/(68-91) 161/91 (08/27 1625) SpO2:  [92 %] 92 % (08/26 1800)  Weight change: 4.554 kg Filed Weights   08/26/19 0442 08/26/19 1411  Weight: 112.9 kg 117.5 kg    Intake/Output:    Intake/Output Summary (Last 24 hours) at 08/27/2019 1709 Last data filed at 08/27/2019 1400 Gross per 24 hour  Intake 1175.82 ml  Output -  Net 1175.82 ml     Physical Exam: General: Laying in bed,   HEENT Moist oral mucus membranes  Neck supple  Pulm/lungs Coarse breath sounds, improving  CVS/Heart regular  Abdomen:  Soft, NT  Extremities: 2+ edema  Neurologic: Alert, oreinted  Skin: Warm dry  Access: none       Basic Metabolic Panel:  Recent Labs  Lab 08/26/19 0451 08/26/19 0818 08/27/19 0602  NA 141  --  140  K 3.4*  --  3.9  CL 109  --  109  CO2 23  --  21*  GLUCOSE 223*  --  162*  BUN 59*  --  62*  CREATININE 5.03*  --  5.16*  CALCIUM 8.8*  --  8.7*  MG  --  2.4  --   PHOS  --   --  4.4     CBC: Recent Labs  Lab 08/26/19 0451 08/27/19 0602  WBC 11.1* 10.5  NEUTROABS  --  8.3*  HGB 12.2 11.2*  HCT 39.5 35.9*  MCV 81.3 80.0  PLT 291 282     No results found for: HEPBSAG, HEPBSAB, HEPBIGM    Microbiology:  Recent Results (from the past 240 hour(s))  Novel Coronavirus, NAA (Hosp order, Send-out to Ref  Lab; TAT 18-24 hrs     Status: None   Collection Time: 08/26/19  9:10 AM   Specimen: Nasopharyngeal Swab; Respiratory  Result Value Ref Range Status   SARS-CoV-2, NAA NOT DETECTED NOT DETECTED Final    Comment: (NOTE) This test was developed and its performance characteristics determined by [employer]. This test has not been FDA cleared or approved. This test has been authorized by FDA under an Emergency Use Authorization (EUA). This test is only authorized for the duration of time the declaration that circumstances exist justifying the authorization of the emergency use of in vitro diagnostic tests for detection of SARS-CoV-2 virus and/or diagnosis of COVID-19 infection under section 564(b)(1) of the Act, 21 U.S.C. KA:123727), unless the authorization is terminated or revoked sooner. When diagnostic testing is negative, the possibility of a false negative result should be considered in the context of a patient's recent exposures and the presence of clinical signs and symptoms consistent with COVID-19. An individual without symptoms of COVID-19 and who is not shedding SARS-CoV-2 virus would expect to have a negative (not detected) result in this assay. Performed  At: [facility] [place], [place]  [id number] [name] MD [id number]    Coronavirus Source NASOPHARYNGEAL  Final    Comment: Performed at [facility], [address] [place], [place] [address]  MRSA PCR Screening     Status: None   Collection Time: 08/26/19  2:11 PM   Specimen: Nasopharyngeal  Result Value Ref Range Status   MRSA by PCR NEGATIVE NEGATIVE Final    Comment:        The GeneXpert MRSA Assay (FDA approved for NASAL specimens only), is one component of a comprehensive MRSA colonization surveillance program. It is not intended to diagnose MRSA infection nor to guide or monitor treatment for MRSA infections. Performed at [facility], [address] [place], [place] [address]     Coagulation Studies: No results for input(s): LABPROT, INR in the last 72 hours.  Urinalysis: No results for input(s): COLORURINE, LABSPEC, PHURINE, GLUCOSEU, HGBUR, BILIRUBINUR, KETONESUR, PROTEINUR, UROBILINOGEN, NITRITE, LEUKOCYTESUR in the last 72 hours.  Invalid input(s): APPERANCEUR    Imaging: Dg Chest 2 View  Result Date: 08/26/2019 CLINICAL DATA:  Chest pain EXAM: CHEST - 2 VIEW COMPARISON:  02/11/2019 FINDINGS: Cardiomegaly. Streaky and hazy density at the right base where there is likely small volume pleural fluid. IMPRESSION: 1. Atelectasis or infection at the right base with small pleural effusion. 2. Chronic cardiomegaly. Electronically Signed   By: Monte Fantasia M.D.   On: 08/26/2019 07:14   US Renal  Result Date: 08/26/2019 CLINICAL DATA:  44 year old female with acute renal failure EXAM: RENAL / URINARY TRACT ULTRASOUND COMPLETE COMPARISON:  Prior renal ultrasound 01/24/2018 FINDINGS: Right Kidney: Renal measurements: 9.9 x 4.3 x 4.5 cm = volume: 101 mL. Progressive increased renal echogenicity. No mass or hydronephrosis visualized. Left Kidney: Renal measurements: 7.9 x 4.0 x 4.0 cm = volume: 64 mL. Progressive increased renal echogenicity. No mass or hydronephrosis visualized. Bladder: Appears normal for degree of bladder distention. IMPRESSION: 1. No evidence of hydronephrosis. 2. Worsening increased echogenicity of the renal parenchyma compared to 01/24/18 consistent with progressive medical renal disease. Electronically Signed   By: Jacqulynn Cadet M.D.   On: 08/26/2019 16:47     Medications:   . niCARDipine 2 mg/hr (08/27/19 0958)   . amLODipine  5 mg Oral Daily  . carvedilol  25 mg Oral BID WC  . Chlorhexidine Gluconate Cloth  6 each Topical Daily  . cloNIDine  0.2 mg Oral BID  . furosemide  40 mg Intravenous BID  . heparin  5,000 Units Subcutaneous Q8H  . hydrALAZINE  100 mg Oral TID  . insulin aspart  0-5  Units Subcutaneous QHS  . insulin aspart  0-9 Units Subcutaneous TID WC  . isosorbide mononitrate  60 mg Oral BID     Assessment/ Plan:  43 y.o. female with Poorly controlled hypertension, diabetes type 2, morbid obesity, anemia, chronic kidney disease, h/o severe LVH Dx [id number], diastolic dysfunction  Principal Problem:   Hypertensive emergency Active Problems:   Diabetes mellitus, type 2 ([place])   Acute on chronic renal failure ([place])   Flash pulmonary edema ([place])   #. 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 Actually she denies knowledge of diabetes Last known baseline creatinine is 3.26 from February 11, 2019, GFR of 19 Current Cr 5.16/GFR 11 It is quite possible that she has progressed to stage  5 CKD but is not ready to consider dialysis yet. We discussed different options available including HD, PD or transplant Renal U.S shows worsening echogenicity Urine Pr/Cr ratio 2.4 gms Previous work up in 2018 includes, neg SPEP, HIV, UPC 0.44, neg tox screen,  No uremic symptoms. No acute indication of HD at present Continue to try to achieve optimal Volume status and BP   #. Anemia of CKD  Lab Results  Component Value Date   HGB 11.2 (L) 08/27/2019  Hemoglobin levels are acceptable  #. SHPTH  No results found for: PTH Lab Results  Component Value Date   PHOS 4.4 08/27/2019  Will obtain PTH tomorrow  #. HTN with CKD Patient presents with significantly elevated blood pressure and is being admitted to ICU for IV antihypertensives and blood pressure control Has severe LVH per echo 12/2017  #. Diabetes type 2 with CKD Hgb A1c MFr Bld (%)  Date Value  08/26/2019 7.5 (H)   follow low carb diet \\avoind  ACE-I ARB at this time due to advanced CKD   LOS: [place] 8/27/20205:09 [place], [place]

## 2019-08-27 NOTE — Progress Notes (Signed)
CRITICAL CARE PROGRESS NOTE    Name: Renee [name] MRN: [id number] DOB: Jan 29, 1976     LOS: 1   SUBJECTIVE FINDINGS & SIGNIFICANT EVENTS   Patient description: 43 year old female with CKD, morbid obesity, CHF, diabetes, essential hypertension, came in with few days of malaise and worsening lower extremity edema found to be with accelerated hypertension, evaluated by cardiology deemed to have flash pulmonary edema with acute on chronic exacerbation of underlying heart failure with preserved EF with consultation critical care from hospitalist service for management of severe fluid overload and accelerated hypertension.   Lines / Drains: PIV  Cultures / Sepsis markers: SARS cov negative  Antibiotics: None    Protocols / Consultants: Cardiology Nephrology Critical care hospitalist service   Tests / Events: TTE  Overnight: BP improved    PAST MEDICAL HISTORY   Past Medical History:  Diagnosis Date  . CHF (congestive heart failure) ([place])   . Hypertension      SURGICAL HISTORY   Past Surgical History:  Procedure Laterality Date  . CESAREAN SECTION    . TUBAL LIGATION       FAMILY HISTORY   Family History  Problem Relation Age of Onset  . Epilepsy Mother   . Hypertension Mother   . Hypertension Father      SOCIAL HISTORY   Social History   Tobacco Use  . Smoking status: Never Smoker  . Smokeless tobacco: Never Used  Substance Use Topics  . Alcohol use: No  . Drug use: No     MEDICATIONS   Current Medication:  Current Facility-Administered Medications:  .  carvedilol (COREG) tablet 25 mg, 25 mg, Oral, BID WC, Visser, Jacquelyn D, PA-C, 25 mg at 08/27/19 0539 .  Chlorhexidine Gluconate Cloth 2 % PADS 6 each, 6 each, Topical, Daily, Ottie Glazier, MD, 6 each at  08/26/19 1411 .  cloNIDine (CATAPRES) tablet 0.2 mg, 0.2 mg, Oral, BID, Visser, Jacquelyn D, PA-C, 0.2 mg at 08/26/19 2217 .  furosemide (LASIX) injection 40 mg, 40 mg, Intravenous, BID, Marrianne Mood D, PA-C, 40 mg at 08/27/19 3570 .  guaiFENesin-codeine 100-10 MG/5ML solution 5 mL, 5 mL, Oral, Q12H PRN, Awilda Bill, NP .  heparin injection 5,000 Units, 5,000 Units, Subcutaneous, Q8H, Awilda Bill, NP, 5,000 Units at 08/27/19 0540 .  hydrALAZINE (APRESOLINE) tablet 100 mg, 100 mg, Oral, TID, Rockey Situ, Kathlene November, MD, 100 mg at 08/26/19 2217 .  insulin aspart (novoLOG) injection 0-5 Units, 0-5 Units, Subcutaneous, QHS, Blakeney, Dana G, NP .  insulin aspart (novoLOG) injection 0-9 Units, 0-9 Units, Subcutaneous, TID WC, Awilda Bill, NP, 2 Units at 08/27/19 [phone] .  isosorbide mononitrate (IMDUR) 24 hr tablet 60 mg, 60 mg, Oral, BID, Gollan, Kathlene November, MD, 60 mg at 08/26/19 2217 .  nicardipine (CARDENE) 39m in 0.86% saline 2081mIV infusion (0.1 mg/ml), 3-15 mg/hr, Intravenous, Continuous, Blakeney, DaDreama SaaNP, Last Rate: 30 mL/hr at 08/27/19 0618, 3 mg/hr at 08/27/19 [id number]  ALLERGIES   Patient has no known allergies.    REVIEW OF SYSTEMS     10 point ROS done and is negative excpet as per subjective findings  PHYSICAL EXAMINATION   Vital Signs: Temp:  [97.8 F (36.6 C)-98.1 F (36.7 C)] 97.8 F (36.6 C) (08/27 0800) Pulse Rate:  [70-85] 75 (08/27 0539) Resp:  [11-31] 24 (08/27 0800) BP: (132-269)/(68-170) 132/74 (08/27 0700) SpO2:  [89 %-97 %] 92 % (08/26 1800) Weight:  [117.5 kg] 117.5 kg (08/26 1411)  GENERAL:NAD  HEAD: Normocephalic, atraumatic.  EYES: Pupils equal, round, reactive to light.  No scleral icterus.  MOUTH: Moist mucosal membrane. NECK: Supple. No thyromegaly. No nodules. No JVD.  PULMONARY: mild bibasilar crackles CARDIOVASCULAR: S1 and S2. Regular rate and rhythm. No murmurs, rubs, or gallops.  GASTROINTESTINAL: Soft, nontender,  non-distended. No masses. Positive bowel sounds. No hepatosplenomegaly.  MUSCULOSKELETAL: No swelling, clubbing, or edema.  NEUROLOGIC: Mild distress due to acute illness SKIN:intact,warm,dry   PERTINENT DATA     Infusions: . niCARDipine 3 mg/hr (08/27/19 0618)   Scheduled Medications: . carvedilol  25 mg Oral BID WC  . Chlorhexidine Gluconate Cloth  6 each Topical Daily  . cloNIDine  0.2 mg Oral BID  . furosemide  40 mg Intravenous BID  . heparin  5,000 Units Subcutaneous Q8H  . hydrALAZINE  100 mg Oral TID  . insulin aspart  0-5 Units Subcutaneous QHS  . insulin aspart  0-9 Units Subcutaneous TID WC  . isosorbide mononitrate  60 mg Oral BID   PRN Medications: guaiFENesin-codeine Hemodynamic parameters:   Intake/Output: 08/26 0701 - 08/27 0700 In: 902.3 [I.V.:902.3] Out: -   Ventilator  Settings:     Other Labs:     LAB RESULTS:  Basic Metabolic Panel: Recent Labs  Lab 08/26/19 0451 08/26/19 0818 08/27/19 0602  NA 141  --  140  K 3.4*  --  3.9  CL 109  --  109  CO2 23  --  21*  GLUCOSE 223*  --  162*  BUN 59*  --  62*  CREATININE 5.03*  --  5.16*  CALCIUM 8.8*  --  8.7*  MG  --  2.4  --   PHOS  --   --  4.4   Liver Function Tests: Recent Labs  Lab 08/26/19 0451 08/27/19 0602  AST 13*  --   ALT 19  --   ALKPHOS 126  --   BILITOT 0.8  --   PROT 7.0  --   ALBUMIN 3.3* 3.0*   No results for input(s): LIPASE, AMYLASE in the last 168 hours. No results for input(s): AMMONIA in the last 168 hours. CBC: Recent Labs  Lab 08/26/19 0451 08/27/19 0602  WBC 11.1* 10.5  NEUTROABS  --  8.3*  HGB 12.2 11.2*  HCT 39.5 35.9*  MCV 81.3 80.0  PLT 291 282   Cardiac Enzymes: No results for input(s): CKTOTAL, CKMB, CKMBINDEX, TROPONINI in the last 168 hours. BNP: Invalid input(s): POCBNP CBG: Recent Labs  Lab 08/26/19 1354 08/26/19 2144 08/27/19 0733  GLUCAP 187* 190* 160*     IMAGING RESULTS:  Imaging: Dg Chest 2 View  Result Date:  08/26/2019 CLINICAL DATA:  Chest pain EXAM: CHEST - 2 VIEW COMPARISON:  02/11/2019 FINDINGS: Cardiomegaly. Streaky and hazy density at the right base where there is likely small volume pleural fluid. IMPRESSION: 1. Atelectasis or infection at the right base with small pleural effusion. 2. Chronic cardiomegaly. Electronically Signed   By: Monte Fantasia M.D.   On: 08/26/2019 07:14   US Renal  Result Date: 08/26/2019 CLINICAL DATA:  43 year old female with acute renal failure EXAM: RENAL / URINARY TRACT ULTRASOUND COMPLETE COMPARISON:  Prior renal ultrasound 01/24/2018 FINDINGS: Right Kidney: Renal measurements: 9.9 x 4.3 x 4.5 cm = volume: 101 mL. Progressive increased renal echogenicity. No mass or hydronephrosis visualized. Left Kidney: Renal measurements: 7.9 x 4.0 x 4.0 cm = volume: 64 mL. Progressive increased renal echogenicity. No mass or hydronephrosis visualized. Bladder: Appears normal for degree of  bladder distention. IMPRESSION: 1. No evidence of hydronephrosis. 2. Worsening increased echogenicity of the renal parenchyma compared to 01/24/18 consistent with progressive medical renal disease. Electronically Signed   By: Jacqulynn Cadet M.D.   On: 08/26/2019 16:47      ASSESSMENT AND PLAN    -Multidisciplinary rounds held today   Accelerated hypertension with end-organ damage     - worsening CKD and flash pulmonary edema and PVD      - continue nicardipine drip with goal to gradually decrease BP by 25% in 6h post admission with re-evaluation     - restart home regimen and counseling for importance of continued compliance      Acute on chronic decompensated diastolic heart failure with EF >55%  - as evidenced by JVD, 3+pitting lower extermity edema, Pulmonary crackles on examination and chest Xray with intestitial edema as well as significant elevation of BNP on serology.  -Cardiology on case - appreciate recommendations - repeat transthoracic echo in process - antihypertensive  regimen -oxygen as needed -follow up cardiac enzymes as indicated ICU monitoring    Renal Failure-acute on chronic CKD KDIGO stage 4 -due to diabetic and hypertensive nephropathy nephorlogy on case - appreciate input Current Cr 5.03/GFR 11 It is quite possible that she has progressed to end-stage renal disease Plan to obtain renal U.S Urine Pr/Cr ratio Previous work up in 2018 includes, neg SPEP, HIV, UPC 0.44, neg tox screen,  No uremic symptoms. No acute indication of HD at present   Acute Hypoxic Respiratory Failure         Patient had SpO2 92% on admission  - liklely due to compressive atelectasis worse at Right lower lobe.  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    ID -continue IV abx as prescibed -follow up cultures  GI/Nutrition GI PROPHYLAXIS as indicated DIET-->TF's as tolerated Constipation protocol as indicated  ENDO - ICU hypoglycemic\Hyperglycemia protocol -check FSBS per protocol   ELECTROLYTES -follow labs as needed -replace as needed -pharmacy consultation   DVT/GI PRX ordered -SCDs  TRANSFUSIONS AS NEEDED MONITOR FSBS ASSESS the need for LABS as needed   Critical care provider statement:    Critical care time (minutes):  109   Critical care time was exclusive of:  Separately billable procedures and treating other patients   Critical care was necessary to treat or prevent imminent or life-threatening deterioration of  the following conditions:  Acute decompensated diastolic chf, accelerated HTN, acute on chronic renal failuure,  DM, morbid obesity,    Critical care was time spent personally by me on the following activities:  Development of treatment plan with patient or surrogate, discussions with consultants, evaluation of patient's response to treatment, examination of patient, obtaining history from patient or surrogate, ordering and performing treatments and interventions, ordering and review of laboratory studies and re-evaluation of patient's condition.  I assumed direction of critical care for this patient from another provider in my specialty: no     This document was prepared using Dragon voice  recognition software and may include unintentional dictation errors.    Ottie Glazier, M.D.  Division of [place]

## 2019-08-27 NOTE — Progress Notes (Signed)
*  PRELIMINARY RESULTS* Echocardiogram 2D Echocardiogram has been performed.  Renee [name] 08/27/2019, 9:59 AM

## 2019-08-28 ENCOUNTER — Encounter: Payer: Self-pay | Admitting: Nurse Practitioner

## 2019-08-28 DIAGNOSIS — E1122 Type 2 diabetes mellitus with diabetic chronic kidney disease: Secondary | ICD-10-CM

## 2019-08-28 DIAGNOSIS — N184 Chronic kidney disease, stage 4 (severe): Secondary | ICD-10-CM

## 2019-08-28 LAB — BASIC METABOLIC PANEL
Anion gap: 10 (ref 5–15)
BUN: 60 mg/dL — ABNORMAL HIGH (ref 6–20)
CO2: 22 mmol/L (ref 22–32)
Calcium: 8.7 mg/dL — ABNORMAL LOW (ref 8.9–10.3)
Chloride: 108 mmol/L (ref 98–111)
Creatinine, Ser: 4.94 mg/dL — ABNORMAL HIGH (ref 0.44–1.00)
GFR calc Af Amer: 12 mL/min — ABNORMAL LOW (ref >60)
GFR calc non Af Amer: 10 mL/min — ABNORMAL LOW (ref >60)
Glucose, Bld: 116 mg/dL — ABNORMAL HIGH (ref 70–99)
Potassium: 3.6 mmol/L (ref 3.5–5.1)
Sodium: 140 mmol/L (ref 135–145)

## 2019-08-28 LAB — CBC WITH DIFFERENTIAL/PLATELET
Abs Immature Granulocytes: 0.06 10*3/uL (ref 0.00–0.07)
Basophils Absolute: 0 10*3/uL (ref 0.0–0.1)
Basophils Relative: 1 %
Eosinophils Absolute: 0.3 10*3/uL (ref 0.0–0.5)
Eosinophils Relative: 4 %
HCT: 35.9 % — ABNORMAL LOW (ref 36.0–46.0)
Hemoglobin: 10.9 g/dL — ABNORMAL LOW (ref 12.0–15.0)
Immature Granulocytes: 1 %
Lymphocytes Relative: 15 %
Lymphs Abs: 1.2 10*3/uL (ref 0.7–4.0)
MCH: 24.8 pg — ABNORMAL LOW (ref 26.0–34.0)
MCHC: 30.4 g/dL (ref 30.0–36.0)
MCV: 81.6 fL (ref 80.0–100.0)
Monocytes Absolute: 0.5 10*3/uL (ref 0.1–1.0)
Monocytes Relative: 7 %
Neutro Abs: 5.7 10*3/uL (ref 1.7–7.7)
Neutrophils Relative %: 72 %
Platelets: 299 10*3/uL (ref 150–400)
RBC: 4.4 MIL/uL (ref 3.87–5.11)
RDW: 17.8 % — ABNORMAL HIGH (ref 11.5–15.5)
WBC: 7.7 10*3/uL (ref 4.0–10.5)
nRBC: 0 % (ref 0.0–0.2)

## 2019-08-28 LAB — GLUCOSE, CAPILLARY
Glucose-Capillary: 213 mg/dL — ABNORMAL HIGH (ref 70–99)
Glucose-Capillary: 164 mg/dL — ABNORMAL HIGH (ref 70–99)
Glucose-Capillary: 126 mg/dL — ABNORMAL HIGH (ref 70–99)
Glucose-Capillary: 140 mg/dL — ABNORMAL HIGH (ref 70–99)

## 2019-08-28 LAB — PARATHYROID HORMONE, INTACT (NO CA): PTH: 139 pg/mL — ABNORMAL HIGH (ref 15–65)

## 2019-08-28 MED ORDER — ISOSORBIDE MONONITRATE ER 60 MG PO TB24
90.0000 mg | ORAL_TABLET | Freq: Two times a day (BID) | ORAL | Status: DC
  Administered 2019-08-28 – 2019-08-30 (×4): 90 mg via ORAL
  Filled 2019-08-28 (×4): qty 1

## 2019-08-28 MED ORDER — TORSEMIDE 20 MG PO TABS
40.0000 mg | ORAL_TABLET | Freq: Every day | ORAL | Status: DC
  Administered 2019-08-28 – 2019-08-30 (×3): 40 mg via ORAL
  Filled 2019-08-28 (×3): qty 2

## 2019-08-28 MED ORDER — AMLODIPINE BESYLATE 10 MG PO TABS
10.0000 mg | ORAL_TABLET | Freq: Every day | ORAL | Status: DC
  Administered 2019-08-29 – 2019-08-30 (×2): 10 mg via ORAL
  Filled 2019-08-28 (×2): qty 1

## 2019-08-28 MED ORDER — AMLODIPINE BESYLATE 5 MG PO TABS
5.0000 mg | ORAL_TABLET | Freq: Once | ORAL | Status: AC
  Administered 2019-08-28: 5 mg via ORAL
  Filled 2019-08-28: qty 1

## 2019-08-28 MED ORDER — LIVING WELL WITH DIABETES BOOK
Freq: Once | Status: AC
  Administered 2019-08-28: 18:00:00
  Filled 2019-08-28: qty 1

## 2019-08-28 MED ORDER — HYDRALAZINE HCL 50 MG PO TABS
150.0000 mg | ORAL_TABLET | Freq: Three times a day (TID) | ORAL | Status: DC
  Administered 2019-08-28 – 2019-08-30 (×5): 150 mg via ORAL
  Filled 2019-08-28 (×5): qty 3

## 2019-08-28 NOTE — Progress Notes (Signed)
Progress Note  Patient Name: Renee [name] Date of Encounter: 08/28/2019  Primary Cardiologist: Ida Rogue, MD  Subjective   She feels much better this morning.  Abdomen much less distended and breathing improved.  Since off of Cardene drip, pressures have been trending in the 160s and subsequently to 180s.  Creatinine slightly better this morning.  Inpatient Medications    Scheduled Meds: . amLODipine  5 mg Oral Daily  . carvedilol  25 mg Oral BID WC  . Chlorhexidine Gluconate Cloth  6 each Topical Daily  . cloNIDine  0.2 mg Oral BID  . heparin  5,000 Units Subcutaneous Q8H  . hydrALAZINE  100 mg Oral TID  . insulin aspart  0-5 Units Subcutaneous QHS  . insulin aspart  0-9 Units Subcutaneous TID WC  . isosorbide mononitrate  60 mg Oral BID  . torsemide  40 mg Oral Daily   Continuous Infusions: . niCARDipine Stopped (08/27/19 1429)   PRN Meds: guaiFENesin-codeine   Vital Signs    Vitals:   08/28/19 0352 08/28/19 0400 08/28/19 0500 08/28/19 0551  BP: (!) 184/84   (!) 180/95  Pulse: 70  99   Resp:  (!) 24 (!) 21 (!) 21  Temp:      TempSrc:      SpO2: 93%  94%   Weight:      Height:        Intake/Output Summary (Last 24 hours) at 08/28/2019 1238 Last data filed at 08/28/2019 1100 Gross per 24 hour  Intake 737.61 ml  Output 300 ml  Net 437.61 ml   Filed Weights   08/26/19 0442 08/26/19 1411  Weight: 112.9 kg 117.5 kg    Physical Exam   GEN: Obese, in no acute distress.  HEENT: Grossly normal.  Neck: Supple, difficult to gauge JVP secondary to body habitus, no carotid bruits, or masses. Cardiac: RRR, no murmurs, rubs, or gallops. No clubbing, cyanosis, trace to 1+ bilateral lower extremity edema.  Radials/DP/PT 2+ and equal bilaterally.  Respiratory:  Respirations regular and unlabored, clear to auscultation bilaterally. GI: Soft, nontender, nondistended, BS + x 4. MS: no deformity or atrophy. Skin: warm and dry, no rash. Neuro:  Strength and  sensation are intact. Psych: AAOx3.  Normal affect.  Labs    Chemistry Recent Labs  Lab 08/26/19 0451 08/27/19 0602 08/28/19 0419  NA 141 140 140  K 3.4* 3.9 3.6  CL 109 109 108  CO2 23 21* 22  GLUCOSE 223* 162* 116*  BUN 59* 62* 60*  CREATININE 5.03* 5.16* 4.94*  CALCIUM 8.8* 8.7* 8.7*  PROT 7.0  --   --   ALBUMIN 3.3* 3.0*  --   AST 13*  --   --   ALT 19  --   --   ALKPHOS 126  --   --   BILITOT 0.8  --   --   GFRNONAA 10* 9* 10*  GFRAA 11* 11* 12*  ANIONGAP 9 10 10      Hematology Recent Labs  Lab 08/26/19 0451 08/27/19 0602 08/28/19 0419  WBC 11.1* 10.5 7.7  RBC 4.86 4.49 4.40  HGB 12.2 11.2* 10.9*  HCT 39.5 35.9* 35.9*  MCV 81.3 80.0 81.6  MCH 25.1* 24.9* 24.8*  MCHC 30.9 31.2 30.4  RDW 17.8* 17.9* 17.8*  PLT 291 282 299    High Sensitivity Troponin:   Recent Labs  Lab 08/26/19 0451 08/26/19 0818  TROPONINIHS 32* 33*     BNP Recent Labs  Lab 08/26/19 0451  BNP 1,589.0*       Radiology    US Renal  Result Date: 08/26/2019 CLINICAL DATA:  43 year old female with acute renal failure EXAM: RENAL / URINARY TRACT ULTRASOUND COMPLETE COMPARISON:  Prior renal ultrasound 01/24/2018 FINDINGS: Right Kidney: Renal measurements: 9.9 x 4.3 x 4.5 cm = volume: 101 mL. Progressive increased renal echogenicity. No mass or hydronephrosis visualized. Left Kidney: Renal measurements: 7.9 x 4.0 x 4.0 cm = volume: 64 mL. Progressive increased renal echogenicity. No mass or hydronephrosis visualized. Bladder: Appears normal for degree of bladder distention. IMPRESSION: 1. No evidence of hydronephrosis. 2. Worsening increased echogenicity of the renal parenchyma compared to 01/24/18 consistent with progressive medical renal disease. Electronically Signed   By: Jacqulynn Cadet M.D.   On: 08/26/2019 16:47    Telemetry    Sinus rhythm- Personally Reviewed  Cardiac Studies   2D echocardiogram 8.27.2020   1. The left ventricle has normal systolic function, with  an ejection fraction of 55-60%. The cavity size was normal. There is severely increased left ventricular wall thickness. Left ventricular diastolic Doppler parameters are consistent with  pseudonormalization.  2. The right ventricle has normal systolic function. The cavity was normal. There is no increase in right ventricular wall thickness.Unable to estimate RVSP.  3. Left atrial size was moderately dilated.  4. 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 female with a history of poorly controlled hypertension, HFpEF (EF 55 to 60%), stage IV chronic kidney disease with acute worsening this admission, who was admitted with malignant hypertension and acute pulmonary edema.  Assessment & Plan    1.  Acute on chronic diastolic congestive heart failure/pulmonary edema/hypertensive emergency: Home blood pressures typically trending in the 180s however, she was admitted with a systolic in the [id number] along with evidence of pulmonary edema and worsening renal failure with admission creatinine of 5.03.  She responded well to Cardene drip and has since been transitioned to oral clonidine, hydralazine, isosorbide mononitrate, carvedilol, and amlodipine.  Pressures now trending in the 160s to 180s off of Cardene.  Will titrate amlodipine to 10 mg daily.  Nephrology to transition to oral torsemide today.  Patient symptomatically feels much better though urine output is inaccurate in the system and so it is not clear to what extent she has diuresed.  Echocardiogram showed normal LV function and the body habitus makes examination somewhat difficult, overall volume status does appear improved as her abdomen is soft and she has only trace to 1+ lower extremity swelling.  2.  Malignant hypertension: See 1.  Continue current regimen with the exception of titration of amlodipine to 10 mg daily.  We will have to watch for any worsening lower extremity swelling.  Could consider additional titration of clonidine  provided that heart rate stable.  If pressures remain persistently elevated on this oral regimen, may need to consider switching carvedilol to labetalol.  Nephrology considering ARB therapy.  3.  Acute on chronic stage IV kidney disease: Creatinine slightly better this morning.  Nephrology following closely.  4.  Elevated troponin: Minimal high-sensitivity troponin elevation of 32-33.  Suspect mild demand ischemia in the setting of hypertensive emergency and heart failure.  Normal LV function without wall motion abnormalities by echo.  Could consider outpatient ischemic evaluation though if abnormal, renal failure makes her a poor candidate for invasive evaluation.  Signed, Murray Hodgkins, NP  08/28/2019, 12:38 PM    For questions or updates, please contact   Please consult  [email or web address] for contact info under Cardiology/STEMI.

## 2019-08-28 NOTE — Progress Notes (Signed)
CRITICAL CARE PROGRESS NOTE    Name: Renee [name] MRN: [id number] DOB: 01/28/1976     LOS: 2   SUBJECTIVE FINDINGS & SIGNIFICANT EVENTS   Patient description: 43 year old female with CKD, morbid obesity, CHF, diabetes, essential hypertension, came in with few days of malaise and worsening lower extremity edema found to be with accelerated hypertension, evaluated by cardiology deemed to have flash pulmonary edema with acute on chronic exacerbation of underlying heart failure with preserved EF with consultation critical care from hospitalist service for management of severe fluid overload and accelerated hypertension.   Lines / Drains: PIV  Cultures / Sepsis markers: SARS cov negative  Antibiotics: None    Protocols / Consultants: Cardiology Nephrology Critical care hospitalist service   Tests / Events: TTE  Overnight: BP improved    PAST MEDICAL HISTORY   Past Medical History:  Diagnosis Date  . CHF (congestive heart failure) ([facility])   . Hypertension      SURGICAL HISTORY   Past Surgical History:  Procedure Laterality Date  . CESAREAN SECTION    . TUBAL LIGATION       FAMILY HISTORY   Family History  Problem Relation Age of Onset  . Epilepsy Mother   . Hypertension Mother   . Hypertension Father      SOCIAL HISTORY   Social History   Tobacco Use  . Smoking status: Never Smoker  . Smokeless tobacco: Never Used  Substance Use Topics  . Alcohol use: No  . Drug use: No     MEDICATIONS   Current Medication:  Current Facility-Administered Medications:  .  amLODipine (NORVASC) tablet 5 mg, 5 mg, Oral, Daily, Gollan, Kathlene November, MD, 5 mg at 08/28/19 0925 .  carvedilol (COREG) tablet 25 mg, 25 mg, Oral, BID WC, Visser, Jacquelyn D, PA-C, 25 mg at 08/28/19 0428 .   Chlorhexidine Gluconate Cloth 2 % PADS 6 each, 6 each, Topical, Daily, Ottie Glazier, MD, 6 each at 08/28/19 [phone] .  cloNIDine (CATAPRES) tablet 0.2 mg, 0.2 mg, Oral, BID, Visser, Jacquelyn D, PA-C, 0.2 mg at 08/28/19 6160 .  furosemide (LASIX) injection 40 mg, 40 mg, Intravenous, BID, Marrianne Mood D, PA-C, 40 mg at 08/28/19 0753 .  guaiFENesin-codeine 100-10 MG/5ML solution 5 mL, 5 mL, Oral, Q12H PRN, Awilda Bill, NP .  heparin injection 5,000 Units, 5,000 Units, Subcutaneous, Q8H, Awilda Bill, NP, 5,000 Units at 08/27/19 0540 .  hydrALAZINE (APRESOLINE) tablet 100 mg, 100 mg, Oral, TID, Rockey Situ, Kathlene November, MD, 100 mg at 08/28/19 0925 .  insulin aspart (novoLOG) injection 0-5 Units, 0-5 Units, Subcutaneous, QHS, Blakeney, Dana G, NP .  insulin aspart (novoLOG) injection 0-9 Units, 0-9 Units, Subcutaneous, TID WC, Awilda Bill, NP, 3 Units at 08/28/19 [phone] .  isosorbide mononitrate (IMDUR) 24 hr tablet 60 mg, 60 mg, Oral, BID, Gollan, Kathlene November, MD, 60 mg at 08/28/19 0925 .  nicardipine (CARDENE) 13m in 0.86% saline 2015mIV infusion (0.1 mg/ml), 3-15 mg/hr, Intravenous, Continuous, BlAwilda BillNP, Stopped at 08/27/19 1429    ALLERGIES   Patient has no known allergies.    REVIEW OF SYSTEMS     10 point ROS done and is negative excpet as per subjective findings  PHYSICAL EXAMINATION   Vital Signs: Temp:  [97.4 F (36.3 C)-97.8 F (36.6 C)] 97.7 F (36.5 C) (08/28 0200) Pulse Rate:  [66-99] 99 (08/28 0500) Resp:  [0-27] 21 (08/28 0551) BP: (148-185)/(77-104) 180/95 (08/28 0551) SpO2:  [93 %-94 %] 94 % (  08/28 0500)  GENERAL:NAD HEAD: Normocephalic, atraumatic.  EYES: Pupils equal, round, reactive to light.  No scleral icterus.  MOUTH: Moist mucosal membrane. NECK: Supple. No thyromegaly. No nodules. No JVD.  PULMONARY: mild bibasilar crackles CARDIOVASCULAR: S1 and S2. Regular rate and rhythm. No murmurs, rubs, or gallops.  GASTROINTESTINAL: Soft,  nontender, non-distended. No masses. Positive bowel sounds. No hepatosplenomegaly.  MUSCULOSKELETAL: No swelling, clubbing, or edema.  NEUROLOGIC: Mild distress due to acute illness SKIN:intact,warm,dry   PERTINENT DATA     Infusions: . niCARDipine Stopped (08/27/19 1429)   Scheduled Medications: . amLODipine  5 mg Oral Daily  . carvedilol  25 mg Oral BID WC  . Chlorhexidine Gluconate Cloth  6 each Topical Daily  . cloNIDine  0.2 mg Oral BID  . furosemide  40 mg Intravenous BID  . heparin  5,000 Units Subcutaneous Q8H  . hydrALAZINE  100 mg Oral TID  . insulin aspart  0-5 Units Subcutaneous QHS  . insulin aspart  0-9 Units Subcutaneous TID WC  . isosorbide mononitrate  60 mg Oral BID   PRN Medications: guaiFENesin-codeine Hemodynamic parameters:   Intake/Output: 08/27 0701 - 08/28 0700 In: 629.5 [P.O.:480; I.V.:149.5] Out: -   Ventilator  Settings:     Other Labs:     LAB RESULTS:  Basic Metabolic Panel: Recent Labs  Lab 08/26/19 0451 08/26/19 0818 08/27/19 0602 08/28/19 0419  NA 141  --  140 140  K 3.4*  --  3.9 3.6  CL 109  --  109 108  CO2 23  --  21* 22  GLUCOSE 223*  --  162* 116*  BUN 59*  --  62* 60*  CREATININE 5.03*  --  5.16* 4.94*  CALCIUM 8.8*  --  8.7* 8.7*  MG  --  2.4  --   --   PHOS  --   --  4.4  --    Liver Function Tests: Recent Labs  Lab 08/26/19 0451 08/27/19 0602  AST 13*  --   ALT 19  --   ALKPHOS 126  --   BILITOT 0.8  --   PROT 7.0  --   ALBUMIN 3.3* 3.0*   No results for input(s): LIPASE, AMYLASE in the last 168 hours. No results for input(s): AMMONIA in the last 168 hours. CBC: Recent Labs  Lab 08/26/19 0451 08/27/19 0602 08/28/19 0419  WBC 11.1* 10.5 7.7  NEUTROABS  --  8.3* 5.7  HGB 12.2 11.2* 10.9*  HCT 39.5 35.9* 35.9*  MCV 81.3 80.0 81.6  PLT 291 282 299   Cardiac Enzymes: No results for input(s): CKTOTAL, CKMB, CKMBINDEX, TROPONINI in the last 168 hours. BNP: Invalid input(s): POCBNP CBG:  Recent Labs  Lab 08/27/19 0733 08/27/19 1149 08/27/19 1538 08/27/19 2125 08/28/19 0747  GLUCAP 160* 150* 161* 132* 213*     IMAGING RESULTS:  Imaging: US Renal  Result Date: 08/26/2019 CLINICAL DATA:  43 year old female with acute renal failure EXAM: RENAL / URINARY TRACT ULTRASOUND COMPLETE COMPARISON:  Prior renal ultrasound 01/24/2018 FINDINGS: Right Kidney: Renal measurements: 9.9 x 4.3 x 4.5 cm = volume: 101 mL. Progressive increased renal echogenicity. No mass or hydronephrosis visualized. Left Kidney: Renal measurements: 7.9 x 4.0 x 4.0 cm = volume: 64 mL. Progressive increased renal echogenicity. No mass or hydronephrosis visualized. Bladder: Appears normal for degree of bladder distention. IMPRESSION: 1. No evidence of hydronephrosis. 2. Worsening increased echogenicity of the renal parenchyma compared to 01/24/18 consistent with progressive medical renal disease. Electronically Signed  By: Jacqulynn Cadet M.D.   On: 08/26/2019 16:47      ASSESSMENT AND PLAN    -Multidisciplinary rounds held today   Accelerated hypertension with end-organ damage     - worsening CKD and flash pulmonary edema and PVD      - continue nicardipine drip with goal to gradually decrease BP by 25% in 6h post admission with re-evaluation     - restart home regimen and counseling for importance of continued compliance      Acute on chronic decompensated diastolic heart failure with EF >55%  - as evidenced by JVD, 3+pitting lower extermity edema, Pulmonary crackles on examination and chest Xray with intestitial edema as well as significant elevation of BNP on serology.  -Cardiology on case - appreciate recommendations - repeat transthoracic echo in process - antihypertensive regimen -oxygen as needed -follow up cardiac enzymes as indicated ICU monitoring    Renal Failure-acute on chronic CKD KDIGO stage 4 -due to diabetic and hypertensive nephropathy nephorlogy on case - appreciate input  Current Cr 5.03/GFR 11 It is quite possible that she has progressed to end-stage renal disease Plan to obtain renal [place] Urine Pr/Cr ratio Previous work up in 2018 includes, neg SPEP, HIV, UPC 0.44, neg tox screen,  No uremic symptoms. No acute indication of HD at present   Acute Hypoxic Respiratory Failure         Patient had SpO2 92% on admission  - liklely due to compressive atelectasis worse at Right lower lobe.  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    ID -continue IV abx as prescibed -follow up cultures  GI/Nutrition GI PROPHYLAXIS as indicated DIET-->TF's as tolerated Constipation protocol as indicated  ENDO - ICU hypoglycemic\Hyperglycemia protocol -check FSBS per protocol   ELECTROLYTES -follow labs as needed -replace as needed -pharmacy consultation   DVT/GI PRX ordered -SCDs  TRANSFUSIONS AS NEEDED MONITOR FSBS ASSESS the need for LABS as needed   Critical care provider statement:    Critical care time (minutes):  33   Critical care time was exclusive of:  Separately billable procedures and treating other patients   Critical care was necessary to treat or prevent imminent or life-threatening deterioration of the following conditions:  Acute decompensated diastolic chf, accelerated HTN, acute on chronic renal failuure,  DM, morbid obesity,    Critical care was time spent personally by me on the following activities:  Development of  treatment plan with patient or surrogate, discussions with consultants, evaluation of patient's response to treatment, examination of patient, obtaining history from patient or surrogate, ordering and performing treatments and interventions, ordering and review of laboratory studies and re-evaluation of patient's condition.  I assumed direction of critical care for this patient from another provider in my specialty: no     This document was prepared using Dragon voice recognition software and may include unintentional dictation errors.    Ottie Glazier, M.D.  Division of [facility]

## 2019-08-28 NOTE — Progress Notes (Signed)
Ch checked on pt during rounds. Pt was on the phone with family. Ch will F/U with pt when they transition to the floor.    08/28/19 1200  Clinical Encounter Type  Visited With Patient  Visit Type Social support

## 2019-08-28 NOTE — Progress Notes (Signed)
Filed in error See another note from same day

## 2019-08-28 NOTE — Progress Notes (Signed)
[place], [place] 08/28/19  Subjective:   LOS: 2 08/27 0701 - 08/28 0700 In: 629.5 [P.O.:480; I.V.:149.5] Out: -  Patient presented to the emergency room today with chest heaviness and swelling all over since Sunday.  Feeling abdominal pressure and dizziness.  Blood pressure at presentation was 264/159 Treated with cardene drip in ICU- off today Has LE edema Getting Iv lasix for diuresis and responding well. UOP not measured   Objective:  Vital signs in last 24 hours:  Temp:  [97.4 F (36.3 C)-97.8 F (36.6 C)] 97.7 F (36.5 C) (08/28 0200) Pulse Rate:  [66-99] 99 (08/28 0500) Resp:  [0-27] 21 (08/28 0551) BP: (148-185)/(77-104) 180/95 (08/28 0551) SpO2:  [93 %-94 %] 94 % (08/28 0500)  Weight change:  Filed Weights   08/26/19 0442 08/26/19 1411  Weight: 112.9 kg 117.5 kg    Intake/Output:    Intake/Output Summary (Last 24 hours) at 08/28/2019 1137 Last data filed at 08/28/2019 1100 Gross per 24 hour  Intake 737.61 ml  Output 300 ml  Net 437.61 ml     Physical Exam: General: Laying in bed,   HEENT Moist oral mucus membranes  Neck supple  Pulm/lungs Coarse breath sounds, improving  CVS/Heart regular  Abdomen:  Soft, NT  Extremities: 1+ edema, improving  Neurologic: Alert, oreinted  Skin: Warm dry  Access: none       Basic Metabolic Panel:  Recent Labs  Lab 08/26/19 0451 08/26/19 0818 08/27/19 0602 08/28/19 0419  NA 141  --  140 140  K 3.4*  --  3.9 3.6  CL 109  --  109 108  CO2 23  --  21* 22  GLUCOSE 223*  --  162* 116*  BUN 59*  --  62* 60*  CREATININE 5.03*  --  5.16* 4.94*  CALCIUM 8.8*  --  8.7* 8.7*  MG  --  2.4  --   --   PHOS  --   --  4.4  --      CBC: Recent Labs  Lab 08/26/19 0451 08/27/19 0602 08/28/19 0419  WBC 11.1* 10.5 7.7  NEUTROABS  --  8.3* 5.7  HGB 12.2 11.2* 10.9*  HCT 39.5 35.9* 35.9*  MCV 81.3 80.0 81.6  PLT 291 282 299     No results found for: HEPBSAG, HEPBSAB,  HEPBIGM    Microbiology:  Recent Results (from the past 240 hour(s))  Novel Coronavirus, NAA (Hosp order, Send-out to [facility]; TAT 18-24 hrs     Status: None   Collection Time: 08/26/19  9:10 AM   Specimen: Nasopharyngeal Swab; Respiratory  Result Value Ref Range Status   SARS-CoV-2, NAA NOT DETECTED NOT DETECTED Final    Comment: (NOTE) This test was developed and its performance characteristics determined by [employer]. This test has not been FDA cleared or approved. This test has been authorized by FDA under an Emergency Use Authorization (EUA). This test is only authorized for the duration of time the declaration that circumstances exist justifying the authorization of the emergency use of in vitro diagnostic tests for detection of SARS-CoV-2 virus and/or diagnosis of COVID-19 infection under section 564(b)(1) of the Act, 21 U.S.C. KA:123727), unless the authorization is terminated or revoked sooner. When diagnostic testing is negative, the possibility of a false negative result should be considered in the context of a patient's recent exposures and the presence of clinical signs and symptoms consistent with COVID-19. An individual without symptoms of COVID-19 and who is not  shedding SARS-CoV-2 virus would expect to have a negative (not detected) result in this assay. Performed  At: [facility] [address] [place], [place] [id number] [name] MD [id number]    [place]  Final    Comment: Performed at [facility], [address] [place], [place] [address]  MRSA PCR Screening     Status: None   Collection Time: 08/26/19  2:11 PM   Specimen: Nasopharyngeal  Result Value Ref Range Status   MRSA by PCR NEGATIVE NEGATIVE Final    Comment:        The GeneXpert MRSA Assay (FDA approved for NASAL specimens only), is one component of a comprehensive MRSA colonization surveillance program. It is not intended  to diagnose MRSA infection nor to guide or monitor treatment for MRSA infections. Performed at [facility], [address] [place], [place] [address]     Coagulation Studies: No results for input(s): LABPROT, INR in the last 72 hours.  Urinalysis: No results for input(s): COLORURINE, LABSPEC, PHURINE, GLUCOSEU, HGBUR, BILIRUBINUR, KETONESUR, PROTEINUR, UROBILINOGEN, NITRITE, LEUKOCYTESUR in the last 72 hours.  Invalid input(s): APPERANCEUR    Imaging: US Renal  Result Date: 08/26/2019 CLINICAL DATA:  43 year old female with acute renal failure EXAM: RENAL / URINARY TRACT ULTRASOUND COMPLETE COMPARISON:  Prior renal ultrasound 01/24/2018 FINDINGS: Right Kidney: Renal measurements: 9.9 x 4.3 x 4.5 cm = volume: 101 mL. Progressive increased renal echogenicity. No mass or hydronephrosis visualized. Left Kidney: Renal measurements: 7.9 x 4.0 x 4.0 cm = volume: 64 mL. Progressive increased renal echogenicity. No mass or hydronephrosis visualized. Bladder: Appears normal for degree of bladder distention. IMPRESSION: 1. No evidence of hydronephrosis. 2. Worsening increased echogenicity of the renal parenchyma compared to 01/24/18 consistent with progressive medical renal disease. Electronically Signed   By: Jacqulynn Cadet M.D.   On: 08/26/2019 16:47     Medications:   . niCARDipine Stopped (08/27/19 1429)   . amLODipine  5 mg Oral Daily  . carvedilol  25 mg Oral BID WC  . Chlorhexidine Gluconate Cloth  6 each Topical Daily  . cloNIDine  0.2 mg Oral BID  . furosemide  40 mg Intravenous BID  . heparin  5,000 Units Subcutaneous Q8H  . hydrALAZINE  100 mg Oral TID  . insulin aspart  0-5 Units Subcutaneous QHS  . insulin aspart  0-9 Units Subcutaneous TID WC  . isosorbide mononitrate  60 mg Oral BID     Assessment/ Plan:  43 y.o. female with Poorly controlled hypertension, diabetes type 2, morbid obesity, anemia, chronic kidney disease, h/o severe LVH Dx [id number], diastolic  dysfunction  Principal Problem:   Hypertensive emergency Active Problems:   Diabetes mellitus, type 2 ([place])   Acute on chronic renal failure ([place])   Flash pulmonary edema ([place])   #. 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 Actually she denies knowledge of diabetes Last known baseline creatinine is 3.26 from February 11, 2019, GFR of 19 Current Cr 5.16/GFR 11 It is quite possible that she has progressed to stage 5 CKD but is not ready to consider dialysis yet. We discussed different options available including HD, PD or transplant Renal U.S shows worsening echogenicity Urine Pr/Cr ratio 2.4 gms Previous work up in 2018 includes, neg SPEP, HIV, UPC 0.44, neg tox  screen,  No uremic symptoms. No acute indication of HD at present Continue to try to achieve optimal Volume status and BP Will arrange outpatient follow up   #. Anemia of CKD  Lab Results  Component Value Date   HGB 10.9 (L) 08/28/2019  Hemoglobin levels are acceptable  #. SHPTH     Component Value Date/Time   PTH 139 (H) 08/27/2019 0602   Lab Results  Component Value Date   PHOS 4.4 08/27/2019  Will obtain PTH tomorrow  #. HTN with CKD and LE edema Patient presents with significantly elevated blood pressure and is being admitted to ICU for IV antihypertensives and blood pressure control Has severe LVH per echo 12/2017 Change lasix to torsemide May need to increase amlodipine as per cardiology  #. Diabetes type 2 with CKD Hgb A1c MFr Bld (%)  Date Value  08/26/2019 7.5 (H)   follow low carb diet Avoid  ACE-I ARB at this time due to advanced CKD   LOS: [place] 8/28/202011:37 AM  [place], [place]

## 2019-08-28 NOTE — Progress Notes (Signed)
[place] [facility] [place] NAME: Maysa Eskildsen    MR#:  [id number]  DATE OF BIRTH:  03/04/1976  SUBJECTIVE:  CHIEF COMPLAINT: Patient is feeling much better, admitting that she skips some doses of her blood pressure medications as she is forgetful  lower extremity swelling is getting better.  Chest discomfort resolved  REVIEW OF SYSTEMS:  CONSTITUTIONAL: No fever, fatigue or weakness.  EYES: No blurred or double vision.  EARS, NOSE, AND THROAT: No tinnitus or ear pain.  RESPIRATORY: No cough, shortness of breath, wheezing or hemoptysis.  CARDIOVASCULAR: No chest pain, orthopnea, edema.  GASTROINTESTINAL: No nausea, vomiting, diarrhea or abdominal pain.  GENITOURINARY: No dysuria, hematuria.  ENDOCRINE: No polyuria, nocturia,  HEMATOLOGY: No anemia, easy bruising or bleeding SKIN: Leg swelling MUSCULOSKELETAL: No joint pain or arthritis.   NEUROLOGIC: No tingling, numbness, weakness.  PSYCHIATRY: No anxiety or depression.   DRUG ALLERGIES:  No Known Allergies  VITALS:  Blood pressure (!) 180/95, pulse 99, temperature 97.7 F (36.5 C), temperature source Oral, resp. rate (!) 21, height 5\' 5"  (1.651 m), weight 117.5 kg, SpO2 94 %.  PHYSICAL EXAMINATION:  GENERAL:  43 y.o.-year-old patient lying in the bed with no acute distress.  EYES: Pupils equal, round, reactive to light and accommodation. No scleral icterus. Extraocular muscles intact.  HEENT: Head atraumatic, normocephalic. Oropharynx and nasopharynx clear.  NECK:  Supple, no jugular venous distention. No thyroid enlargement, no tenderness.  LUNGS: Normal breath sounds bilaterally, no wheezing, rales,rhonchi or crepitation. No use of accessory muscles of respiration.  CARDIOVASCULAR: S1, S2 normal. No murmurs, rubs, or gallops.  ABDOMEN: Soft, nontender, nondistended. Bowel sounds present.  EXTREMITIES: Significant pedal edema, no cyanosis, or clubbing.  NEUROLOGIC: Cranial  nerves II through XII are intact. Muscle strength 5/5 in all extremities. Sensation intact. Gait not checked.  PSYCHIATRIC: The patient is alert and oriented x 3.  SKIN: No obvious rash, lesion, or ulcer.    LABORATORY PANEL:   CBC Recent Labs  Lab 08/28/19 0419  WBC 7.7  HGB 10.9*  HCT 35.9*  PLT 299   ------------------------------------------------------------------------------------------------------------------  Chemistries  Recent Labs  Lab 08/26/19 0451 08/26/19 0818  08/28/19 0419  NA 141  --    < > 140  K 3.4*  --    < > 3.6  CL 109  --    < > 108  CO2 23  --    < > 22  GLUCOSE 223*  --    < > 116*  BUN 59*  --    < > 60*  CREATININE 5.03*  --    < > 4.94*  CALCIUM 8.8*  --    < > 8.7*  MG  --  2.4  --   --   AST 13*  --   --   --   ALT 19  --   --   --   ALKPHOS 126  --   --   --   BILITOT 0.8  --   --   --    < > = values in this interval not displayed.   ------------------------------------------------------------------------------------------------------------------  Cardiac Enzymes No results for input(s): TROPONINI in the last 168 hours. ------------------------------------------------------------------------------------------------------------------  RADIOLOGY:  US Renal  Result Date: 08/26/2019 CLINICAL DATA:  43 year old female with acute renal failure EXAM: RENAL / URINARY TRACT ULTRASOUND COMPLETE COMPARISON:  Prior renal ultrasound 01/24/2018 FINDINGS: Right Kidney: Renal measurements: 9.9 x 4.3 x 4.5 cm = volume: 101  mL. Progressive increased renal echogenicity. No mass or hydronephrosis visualized. Left Kidney: Renal measurements: 7.9 x 4.0 x 4.0 cm = volume: 64 mL. Progressive increased renal echogenicity. No mass or hydronephrosis visualized. Bladder: Appears normal for degree of bladder distention. IMPRESSION: 1. No evidence of hydronephrosis. 2. Worsening increased echogenicity of the renal parenchyma compared to 01/24/18 consistent with  progressive medical renal disease. Electronically Signed   By: Jacqulynn Cadet M.D.   On: 08/26/2019 16:47    EKG:   Orders placed or performed during the hospital encounter of 08/26/19  . EKG 12-Lead  . EKG 12-Lead    ASSESSMENT AND PLAN:   # Malignant HTN with end organ damage-noncompliant with medications Patient is  on  Cardene drip, will titrate and wean off the drip for systolic blood pressure to 150 Nitropaste Discussed with intensivist , and cardiology Check TSH Patient is on Coreg, clonidine, Cozaar and IV Lasix, titrate medications as needed.  Patient is started on amlodipine which might need to be changed in view of significant lower extremity edema Reinforced the importance of being compliant with the medications and suggested alarm reminders to help to be compliant with her meds Echocardiogram preliminary report LVH with grossly normal EF  #  AKI on CKD-3-from hypertensive nephropathy with uncontrolled malignant hypertension Monitor renal function closely while patient is on IV Lasix Avoid nephrotoxins Nephrology is following the patient.  No uremic symptoms no acute indication for hemodialysis at this point Renal ultrasound with no evidence of hydronephrosis and worsening of renal parenchyma compared to January 24, 2018 consistent with progressive medical renal disease Creatinine 4.94, BUN at 60  # Acute on chronic CHF  on  Lasix 40 mg twice a day,  spironolactone.  Nephrology is recommending to change her to torsemide Echocardiogram- preliminary report LVH  Intake and output and monitor daily weights Cardiology consult placed and Dr. Nehemiah Massed is following  #Hypokalemia Repleted and recheck potassium at 3.9-3.6  #Morbid obesity Advised lifestyle modification     All the records are reviewed and case discussed with Care Management/Social Workerr. Management plans discussed with the patient, he is in agreement. Discussed with intensivist  CODE STATUS:  fc .  Husband and sister rate are the healthcare power of attorney  TOTAL TIME TAKING CARE OF THIS PATIENT: 37 minutes.   POSSIBLE D/C IN 2  DAYS, DEPENDING ON CLINICAL CONDITION.  Note: This dictation was prepared with Dragon dictation along with smaller phrase technology. Any transcriptional errors that result from this process are unintentional.   Nicholes Mango M.D on 08/28/2019 at 12:10 PM  Between 7am to 6pm - Pager - [phone] After 6pm go to [email or web address] - password [email or web address] [facility]  Office  [phone]  CC: Primary care physician; Minda Ditto, MD

## 2019-08-28 NOTE — Progress Notes (Signed)
Shift Summary:  Patient has remained off of the Cardene drip for the entirety of this shift so far. Plan is to restart drip if systolic blood pressure exceeds 190. Will continue to monitor the patient.  Cameron Ali, RN

## 2019-08-28 NOTE — Progress Notes (Signed)
Inpatient Diabetes Program Recommendations  AACE/ADA: New Consensus Statement on Inpatient Glycemic Control (2015)  Target Ranges:  Prepandial:   less than 140 mg/dL      Peak postprandial:   less than 180 mg/dL (1-2 hours)      Critically ill patients:  140 - 180 mg/dL   Results for Renee [name], Renee [name] (MRN [id number]) as of 08/28/2019 10:25  Ref. Range 08/27/2019 07:33 08/27/2019 11:49 08/27/2019 15:38 08/27/2019 21:25  Glucose-Capillary Latest Ref Range: 70 - 99 mg/dL 160 (H)  2 units NOVOLOG  150 (H)  1 unit NOVOLOG  161 (H)  2 units NOVOLOG  132 (H)   Results for [name], [name] (MRN [id number]) as of 08/28/2019 10:25  Ref. Range 08/28/2019 07:47  Glucose-Capillary Latest Ref Range: 70 - 99 mg/dL 213 (H)  3 units NOVOLOG    Results for [name], [name] (MRN [id number]) as of 08/28/2019 10:25  Ref. Range 06/15/2017 04:02 01/22/2018 15:02 08/26/2019 11:52  Hemoglobin A1C Latest Ref Range: 4.8 - 5.6 % 7.1 (H) 8.1 (H) 7.5 (H)  (168 mg/dl)     Admit with: Malignant HTN with end organ damage/ AKI on CKD-3-from hypertensive nephropathy with uncontrolled malignant hypertension/ Acute on chronic CHF   History: CHF, Gestational DM 16 years ago  Current Orders: Novolog Sensitive Correction Scale/ SSI (0-9 units) TID AC + HS      Patient denies knowledge of diabetes even though her last few A1c levels on record have shown Diabetes (A1c 7.1% back in June 2018, 8.1% back in January 2019).  Diabetes Coordinator RN talked with patient back in January 2019 about her elevated A1c and blood sugar levels.  Note RD has talked with patient about DM diet modifications at home.  Will try to speak with pt today.    --Will follow patient during hospitalization--  Wyn Quaker RN, MSN, CDE Diabetes Coordinator Inpatient Glycemic Control Team Team Pager: [phone] (8a-5p)

## 2019-08-28 NOTE — Progress Notes (Addendum)
Results for Renee [name], Renee [name] (MRN [id number]) as of 08/28/2019 10:25  Ref. Range 06/15/2017 04:02 01/22/2018 15:02 08/26/2019 11:52  Hemoglobin A1C Latest Ref Range: 4.8 - 5.6 % 7.1 (H) 8.1 (H) 7.5 (H)  (168 mg/dl)     Admit with: Malignant HTN with end organ damage/ AKI on CKD-3-from hypertensive nephropathy with uncontrolled malignant hypertension/ Acuteon chronicCHF  History: CHF, Gestational DM 16 years ago  Current Orders: Novolog Sensitive Correction Scale/ SSI (0-9 units) TID AC + HS     Patient denies knowledge of diabetes even though her last few A1c levels on record have shown Diabetes (A1c 7.1% back in June 2018, 8.1% back in January 2019).  Diabetes Coordinator RN talked with patient back in January 2019 about her elevated A1c and blood sugar levels.  Note RD has talked with patient about DM diet modifications at home.     I met with pt today around 11:35am.  Discussed with pt that we checked an A1c and it was elevated to 7.5%.  Pt knew what an A1c was but seemed like she had no idea that it could be elevated.  Per Chart Review, pt saw her PCP Dr. Vanessa Barbara with [facility] back in May.  PCP mentioned Type 2 DM but did not start pt on any meds.  Of note, pt was seen by the Diabetes RN back in 2019 when her A1c was 8.1%.  Explained to pt that I cannot diagnose her with diabetes (MD will need to do that).  Explained to pt why we are checking her CBGs, why we are giving her insulin, etc.  Pt told me she checked her CBGs and gave herself insulin 16 years ago with her last pregnancy.  States she would be comfortable checking CBGs again.  Explained to pt that I am not sure if MD will discharge pt on any DM meds.  Explained that the MD may want pt to check her CBGs at home and follow up with her PCP soon after d/c.  Pt stated to me she had an appt with her PCP today to get her A1c checked.  Explained to pt that her PCP will have access to her records here in the  hospital.  Discussed A1C results with her and explained what an A1C is, basic pathophysiology of DM Type 2, basic home care, basic diabetes diet nutrition principles, importance of checking CBGs and maintaining good CBG control to prevent long-term and short-term complications.  Also reviewed blood sugar goals and A1c goals for home.    Also discussed DM diet information with patient.  Encouraged patient to avoid beverages with sugar (regular soda, sweet tea, lemonade, fruit juice) and to consume mostly water.  Discussed what foods contain carbohydrates and how carbohydrates affect the body's blood sugar levels.  Encouraged patient to be careful with her portion sizes (especially grains, starchy vegetables, and fruits).  Explained to patient that women should have 45-60 grams of carbohydrates per meal per day.    --Will follow patient during hospitalization--  Wyn Quaker RN, MSN, CDE Diabetes Coordinator Inpatient Glycemic Control Team Team Pager: [phone] (8a-5p)

## 2019-08-29 DIAGNOSIS — I5033 Acute on chronic diastolic (congestive) heart failure: Secondary | ICD-10-CM

## 2019-08-29 LAB — CBC WITH DIFFERENTIAL/PLATELET
Abs Immature Granulocytes: 0.03 10*3/uL (ref 0.00–0.07)
Basophils Absolute: 0 10*3/uL (ref 0.0–0.1)
Basophils Relative: 0 %
Eosinophils Absolute: 0.3 10*3/uL (ref 0.0–0.5)
Eosinophils Relative: 3 %
HCT: 36.5 % (ref 36.0–46.0)
Hemoglobin: 11.3 g/dL — ABNORMAL LOW (ref 12.0–15.0)
Immature Granulocytes: 0 %
Lymphocytes Relative: 14 %
Lymphs Abs: 1.1 10*3/uL (ref 0.7–4.0)
MCH: 24.7 pg — ABNORMAL LOW (ref 26.0–34.0)
MCHC: 31 g/dL (ref 30.0–36.0)
MCV: 79.9 fL — ABNORMAL LOW (ref 80.0–100.0)
Monocytes Absolute: 0.6 10*3/uL (ref 0.1–1.0)
Monocytes Relative: 8 %
Neutro Abs: 5.6 10*3/uL (ref 1.7–7.7)
Neutrophils Relative %: 75 %
Platelets: 297 10*3/uL (ref 150–400)
RBC: 4.57 MIL/uL (ref 3.87–5.11)
RDW: 17.6 % — ABNORMAL HIGH (ref 11.5–15.5)
WBC: 7.5 10*3/uL (ref 4.0–10.5)
nRBC: 0 % (ref 0.0–0.2)

## 2019-08-29 LAB — GLUCOSE, CAPILLARY
Glucose-Capillary: 146 mg/dL — ABNORMAL HIGH (ref 70–99)
Glucose-Capillary: 166 mg/dL — ABNORMAL HIGH (ref 70–99)
Glucose-Capillary: 140 mg/dL — ABNORMAL HIGH (ref 70–99)
Glucose-Capillary: 185 mg/dL — ABNORMAL HIGH (ref 70–99)

## 2019-08-29 LAB — BASIC METABOLIC PANEL
Anion gap: 11 (ref 5–15)
BUN: 65 mg/dL — ABNORMAL HIGH (ref 6–20)
CO2: 24 mmol/L (ref 22–32)
Calcium: 8.7 mg/dL — ABNORMAL LOW (ref 8.9–10.3)
Chloride: 104 mmol/L (ref 98–111)
Creatinine, Ser: 5.17 mg/dL — ABNORMAL HIGH (ref 0.44–1.00)
GFR calc Af Amer: 11 mL/min — ABNORMAL LOW (ref >60)
GFR calc non Af Amer: 9 mL/min — ABNORMAL LOW (ref >60)
Glucose, Bld: 165 mg/dL — ABNORMAL HIGH (ref 70–99)
Potassium: 3.6 mmol/L (ref 3.5–5.1)
Sodium: 139 mmol/L (ref 135–145)

## 2019-08-29 NOTE — Progress Notes (Signed)
Renee [name]  MRN: [id number]  DOB/AGE: 07-Oct-1976 43 y.o.  Primary Care Physician:Patel, Grier Mitts, MD  Admit date: 08/26/2019  Chief Complaint:  Chief Complaint  Patient presents with  . Shortness of Breath    S-Pt presented on  08/26/2019 with  Chief Complaint  Patient presents with  . Shortness of Breath  .    Pt today feels better.Pt had questions abut " What happened to my kidney?"    I had extensive discussio  With pt about her kidney related issues. I answered her queries to best of my ability.   meds  . amLODipine  10 mg Oral Daily  . carvedilol  44 mg Oral BID WC  . Chlorhexidine Gluconate Cloth  6 each Topical Daily  . cloNIDine  0.2 mg Oral BID  . heparin  5,000 Units Subcutaneous Q8H  . hydrALAZINE  150 mg Oral TID  . insulin aspart  0-5 Units Subcutaneous QHS  . insulin aspart  0-9 Units Subcutaneous TID WC  . isosorbide mononitrate  90 mg Oral BID  . torsemide  40 mg Oral Daily         [id number] from the symptoms mentioned above,there are no other symptoms referable to all systems reviewed.  Physical Exam: Vital signs in last 24 hours: Temp:  [97.7 F (36.5 C)-98.4 F (36.9 C)] 98 F (36.7 C) (08/29 0718) Pulse Rate:  [65-72] 65 (08/29 1200) Resp:  [18-28] 18 (08/29 1200) BP: (161-181)/(77-94) 161/77 (08/29 1200) SpO2:  [93 %-97 %] 95 % (08/29 1200) Weight:  [114.1 kg] 114.1 kg (08/29 0433) Weight change:  Last BM Date: 08/28/19  Intake/Output from previous day: 08/28 0701 - 08/29 0700 In: 240 [P.O.:240] Out: 1300 [Urine:1300] Total I/O In: 120 [P.O.:120] Out: -    Physical Exam: General- pt is awake,alert, oriented to time place and person Resp- No acute REsp distress, CTA B/L NO Rhonchi CVS- S1S2 regular ij=n rate and rhythm GIT- BS+, soft, NT, ND, obese EXT- 1+ LE Edema, NO Cyanosis   Lab Results: CBC Recent Labs    08/28/19 0419 08/29/19 0447  WBC 7.7 7.5  HGB 10.9* 11.3*  HCT 35.9* 36.5  PLT 299 297     BMET Recent Labs    08/28/19 0419 08/29/19 0447  NA 140 139  K 3.6 3.6  CL 108 104  CO2 22 24  GLUCOSE 116* 165*  BUN 60* 65*  CREATININE 4.94* 5.17*  CALCIUM 8.7* 8.7*    Creat trend 2020  4.9--5.1 2019  2.7--3.8 2018  2.4--3.1 2017  1.9--2.4 2016  1.5  MICRO Recent Results (from the past 240 hour(s))  Novel Coronavirus, NAA (Hosp order, Send-out to [facility]; TAT 18-24 hrs     Status: None   Collection Time: 08/26/19  9:10 AM   Specimen: Nasopharyngeal Swab; Respiratory  Result Value Ref Range Status   SARS-CoV-2, NAA NOT DETECTED NOT DETECTED Final    Comment: (NOTE) This test was developed and its performance characteristics determined by [employer]. This test has not been FDA cleared or approved. This test has been authorized by FDA under an Emergency Use Authorization (EUA). This test is only authorized for the duration of time the declaration that circumstances exist justifying the authorization of the emergency use of in vitro diagnostic tests for detection of SARS-CoV-2 virus and/or diagnosis of COVID-19 infection under section 564(b)(1) of the Act, 21 U.S.C. KA:123727), unless the authorization is terminated or revoked sooner. When diagnostic testing is negative, the possibility of a false  negative result should be considered in the context of a patient's recent exposures and the presence of clinical signs and symptoms consistent with COVID-19. An individual without symptoms of COVID-19 and who is not shedding SARS-CoV-2 virus would expect to have a negative (not detected) result in this assay. Performed  At: [facility] [address] [place], [place] [id number] [name] MD [id number]    [place]  Final    Comment: Performed at [facility], [address] [place], [place] [address]  MRSA PCR Screening     Status: None   Collection Time: 08/26/19  2:11 PM   Specimen: Nasopharyngeal   Result Value Ref Range Status   MRSA by PCR NEGATIVE NEGATIVE Final    Comment:        The GeneXpert MRSA Assay (FDA approved for NASAL specimens only), is one component of a comprehensive MRSA colonization surveillance program. It is not intended to diagnose MRSA infection nor to guide or monitor treatment for MRSA infections. Performed at [facility], [address], [place], [place] [address]       Lab Results  Component Value Date   PTH 139 (H) 08/27/2019   CALCIUM 8.7 (L) 08/29/2019   PHOS 4.4 08/27/2019         Impression: 1)Renal  AKI vs CKD progression               CKD stage 5 .               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 Calcium when corrected for low albumin is at goal  5)CHF- diastolic On Diuretics Cardiology and  Primary MD following  6)Electrolytes Normokalemic NOrmonatremic   7)Acid base Co2 at goal     Plan:  Will continue current care.      Lamar S 08/29/2019, 12:57 PM

## 2019-08-29 NOTE — Progress Notes (Signed)
Progress Note  Patient Name: Renee [name] Date of Encounter: 08/29/2019  Primary Cardiologist: Ida Rogue, MD  Subjective   Feels better today. No dyspnea, chest pain. LE edema is better. Her abdomen feels less tight.   Inpatient Medications    Scheduled Meds: . amLODipine  10 mg Oral Daily  . carvedilol  25 mg Oral BID WC  . Chlorhexidine Gluconate Cloth  6 each Topical Daily  . cloNIDine  0.2 mg Oral BID  . heparin  5,000 Units Subcutaneous Q8H  . hydrALAZINE  150 mg Oral TID  . insulin aspart  0-5 Units Subcutaneous QHS  . insulin aspart  0-9 Units Subcutaneous TID WC  . isosorbide mononitrate  90 mg Oral BID  . torsemide  40 mg Oral Daily   Continuous Infusions: . niCARDipine Stopped (08/27/19 1429)   PRN Meds: guaiFENesin-codeine   Vital Signs    Vitals:   08/28/19 2033 08/29/19 0433 08/29/19 0443 08/29/19 0718  BP: (!) 168/94  (!) 176/89 (!) 181/92  Pulse: 69  72 68  Resp: (!) 21  (!) 22 19  Temp: 97.7 F (36.5 C)  98.4 F (36.9 C) 98 F (36.7 C)  TempSrc: Oral  Oral Oral  SpO2: 96%  94% 93%  Weight:  114.1 kg    Height:        Intake/Output Summary (Last 24 hours) at 08/29/2019 0827 Last data filed at 08/28/2019 2320 Gross per 24 hour  Intake 240 ml  Output 1300 ml  Net -1060 ml   Filed Weights   08/26/19 0442 08/26/19 1411 08/29/19 0433  Weight: 112.9 kg 117.5 kg 114.1 kg    Physical Exam   General: Well developed, well nourished, NAD  HEENT: OP clear, mucus membranes moist  SKIN: warm, dry. No rashes. Neuro: No focal deficits  Musculoskeletal: Muscle strength 5/5 all ext  Psychiatric: Mood and affect normal  Neck: No JVD, no carotid bruits, no thyromegaly, no lymphadenopathy.  Lungs:Clear bilaterally, no wheezes, rhonci, crackles Cardiovascular: Regular rate and rhythm. No murmurs, gallops or rubs. Abdomen:Soft. Bowel sounds present. Non-tender.  Extremities: No lower extremity edema. Pulses are 2 + in the bilateral DP/PT.   Labs    Chemistry Recent Labs  Lab 08/26/19 0451 08/27/19 0602 08/28/19 0419 08/29/19 0447  NA 141 140 140 139  K 3.4* 3.9 3.6 3.6  CL 109 109 108 104  CO2 23 21* 22 24  GLUCOSE 223* 162* 116* 165*  BUN 59* 62* 60* 65*  CREATININE 5.03* 5.16* 4.94* 5.17*  CALCIUM 8.8* 8.7* 8.7* 8.7*  PROT 7.0  --   --   --   ALBUMIN 3.3* 3.0*  --   --   AST 13*  --   --   --   ALT 19  --   --   --   ALKPHOS 126  --   --   --   BILITOT 0.8  --   --   --   GFRNONAA 10* 9* 10* 9*  GFRAA 11* 11* 12* 11*  ANIONGAP 9 10 10 11      Hematology Recent Labs  Lab 08/27/19 0602 08/28/19 0419 08/29/19 0447  WBC 10.5 7.7 7.5  RBC 4.49 4.40 4.57  HGB 11.2* 10.9* 11.3*  HCT 35.9* 35.9* 36.5  MCV 80.0 81.6 79.9*  MCH 24.9* 24.8* 24.7*  MCHC 31.2 30.4 31.0  RDW 17.9* 17.8* 17.6*  PLT 282 299 297    High Sensitivity Troponin:   Recent Labs  Lab 08/26/19 0451  08/26/19 0818  TROPONINIHS 32* 33*     BNP Recent Labs  Lab 08/26/19 0451  BNP 1,589.0*       Radiology    No results found.  Telemetry    NSR- Personally Reviewed  Cardiac Studies   2D echocardiogram 8.27.2020   1. The left ventricle has normal systolic function, with an ejection fraction of 55-60%. The cavity size was normal. There is severely increased left ventricular wall thickness. Left ventricular diastolic Doppler parameters are consistent with  pseudonormalization.  2. The right ventricle has normal systolic function. The cavity was normal. There is no increase in right ventricular wall thickness.Unable to estimate RVSP.  3. Left atrial size was moderately dilated.  4. 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 female with a history of poorly controlled hypertension, HFpEF (EF 55 to 60%), stage IV chronic kidney disease with acute worsening this admission, who was admitted with malignant hypertension and acute pulmonary edema.  Assessment & Plan    1.  Acute on chronic diastolic congestive  heart failure/pulmonary edema/hypertensive emergency: Home blood pressures typically trending in the 180s however, she was admitted with a systolic in the [id number] along with evidence of pulmonary edema and worsening renal failure with admission creatinine of 5.03.  She responded well to Cardene drip.  Echo with normal LV systolic function. BP at baseline on oral therapy with hydralazine, clonidine, Imdur, Coreg and norvasc. She is now on Torsemide per Nephrology.  -Volume status ok today on current therapy.  -No changes recommended today  - 2.  Acute on chronic stage IV kidney disease: Renal function is stable. Nephrology is following.   3.  Elevated troponin: Minimal high-sensitivity troponin elevation of 32-33.  This is likely due to demand ischemia is setting of hypertensive emergency. No ischemic evaluation is planned at this time.   Cardiology will see again on Monday. Please call this weekend if there are questions.   Signed, Lauree Chandler, MD  08/29/2019, 8:27 AM    For questions or updates, please contact   Please consult [email or web address] for contact info under Cardiology/STEMI.

## 2019-08-29 NOTE — Progress Notes (Signed)
[facility] at [place] NAME: Renee [name]    MR#:  [id number]  DATE OF BIRTH:  Apr 16, 1976  SUBJECTIVE:  CHIEF COMPLAINT:  Patient sitting up in bed.  No chest pain.  No shortness of breath.  No fevers.   Described what appears to have been a transient episode of panic attack which completely resolved   REVIEW OF SYSTEMS:  CONSTITUTIONAL: No fever, fatigue or weakness.  EYES: No blurred or double vision.  EARS, NOSE, AND THROAT: No tinnitus or ear pain.  RESPIRATORY: No cough, shortness of breath, wheezing or hemoptysis.  CARDIOVASCULAR: No chest pain, orthopnea, edema.  GASTROINTESTINAL: No nausea, vomiting, diarrhea or abdominal pain.  GENITOURINARY: No dysuria, hematuria.  ENDOCRINE: No polyuria, nocturia,  HEMATOLOGY: No anemia, easy bruising or bleeding SKIN: Leg swelling MUSCULOSKELETAL: No joint pain or arthritis.   NEUROLOGIC: No tingling, numbness, weakness.  PSYCHIATRY: No anxiety or depression.   DRUG ALLERGIES:  No Known Allergies  VITALS:  Blood pressure (!) 161/77, pulse 65, temperature 98 F (36.7 C), temperature source Oral, resp. rate 18, height 5\' 5"  (1.651 m), weight 114.1 kg, SpO2 95 %.  PHYSICAL EXAMINATION:  GENERAL:  43 y.o.-year-old patient lying in the bed with no acute distress.  EYES: Pupils equal, round, reactive to light and accommodation. No scleral icterus. Extraocular muscles intact.  HEENT: Head atraumatic, normocephalic. Oropharynx and nasopharynx clear.  NECK:  Supple, no jugular venous distention. No thyroid enlargement, no tenderness.  LUNGS: Normal breath sounds bilaterally, no wheezing, rales,rhonchi or crepitation. No use of accessory muscles of respiration.  CARDIOVASCULAR: S1, S2 normal. No murmurs, rubs, or gallops.  ABDOMEN: Soft, nontender, nondistended. Bowel sounds present.  EXTREMITIES: Significant pedal edema, no cyanosis, or clubbing.  NEUROLOGIC: Cranial nerves II through XII  are intact. Muscle strength 5/5 in all extremities. Sensation intact. Gait not checked.  PSYCHIATRIC: The patient is alert and oriented x 3.  SKIN: No obvious rash, lesion, or ulcer.    LABORATORY PANEL:   CBC Recent Labs  Lab 08/29/19 0447  WBC 7.5  HGB 11.3*  HCT 36.5  PLT 297   ------------------------------------------------------------------------------------------------------------------  Chemistries  Recent Labs  Lab 08/26/19 0451 08/26/19 0818  08/29/19 0447  NA 141  --    < > 139  K 3.4*  --    < > 3.6  CL 109  --    < > 104  CO2 23  --    < > 24  GLUCOSE 223*  --    < > 165*  BUN 59*  --    < > 65*  CREATININE 5.03*  --    < > 5.17*  CALCIUM 8.8*  --    < > 8.7*  MG  --  2.4  --   --   AST 13*  --   --   --   ALT 19  --   --   --   ALKPHOS 126  --   --   --   BILITOT 0.8  --   --   --    < > = values in this interval not displayed.   ------------------------------------------------------------------------------------------------------------------  Cardiac Enzymes No results for input(s): TROPONINI in the last 168 hours. ------------------------------------------------------------------------------------------------------------------  RADIOLOGY:  No results found.  EKG:   Orders placed or performed during the hospital encounter of 08/26/19  . EKG 12-Lead  . EKG 12-Lead    ASSESSMENT AND PLAN:   # Malignant HTN with end  organ damage-noncompliant with medications Patient initially started on Cardene drip and admitted to ICU.  Cardene drip already weaned off and patient transferred out of ICU. Patient admitted to not taking her medications including the clonidine at the exact time she is scheduled to take it with strong potential for rebound hypertension.  Importance of compliance with medication regimen reemphasized this morning. Blood pressure control improving with current meds. Patient is on Coreg, Norvasc, hydralazine, clonidine, and  torsemide Echocardiogram preliminary report LVH with grossly normal EF  #  AKI on CKD-3-from hypertensive nephropathy with uncontrolled malignant hypertension Monitor renal function closely while patient is on IV Lasix Avoid nephrotoxins Nephrology is following the patient.  No uremic symptoms no acute indication for hemodialysis at this point Renal ultrasound with no evidence of hydronephrosis and worsening of renal parenchyma compared to January 24, 2018 consistent with progressive medical renal disease Creatinine 4.94, BUN at 60  # Acute on chronic CHF  Lasix discontinued and patient started on torsemide.  2D echocardiogram done with ejection fraction of 55 to 60%. Cardiologist following.  Volume status currently okay on current regimen. Noted very minimal elevation in troponin which is said to be likely due to demand ischemia in the setting of malignant hypertension  #Hypokalemia Repleted and recheck potassium at 3.9-3.6  #Morbid obesity Advised lifestyle modification  DVT prophylaxis; heparin   All the records are reviewed and case discussed with Care Management/Social Workerr. Management plans discussed with the patient, he is in agreement. Discussed with intensivist  CODE STATUS: Full code.  Husband and sister rate are the healthcare power of attorney  TOTAL TIME TAKING CARE OF THIS PATIENT: 34 minutes.   POSSIBLE D/C IN 1-2  DAYS, DEPENDING ON CLINICAL CONDITION.  Note: This dictation was prepared with Dragon dictation along with smaller phrase technology. Any transcriptional errors that result from this process are unintentional.   Marcelino Campos M.D on 08/29/2019 at 2:53 PM  Between 7am to 6pm - Pager - [phone] After 6pm go to [email or web address] - password [email or web address] [facility]  Office  [phone]  CC: Primary care physician; Minda Ditto, MD

## 2019-08-30 DIAGNOSIS — I161 Hypertensive emergency: Secondary | ICD-10-CM

## 2019-08-30 LAB — BASIC METABOLIC PANEL
Anion gap: 13 (ref 5–15)
BUN: 70 mg/dL — ABNORMAL HIGH (ref 6–20)
CO2: 24 mmol/L (ref 22–32)
Calcium: 8.6 mg/dL — ABNORMAL LOW (ref 8.9–10.3)
Chloride: 103 mmol/L (ref 98–111)
Creatinine, Ser: 5.33 mg/dL — ABNORMAL HIGH (ref 0.44–1.00)
GFR calc Af Amer: 10 mL/min — ABNORMAL LOW (ref >60)
GFR calc non Af Amer: 9 mL/min — ABNORMAL LOW (ref >60)
Glucose, Bld: 127 mg/dL — ABNORMAL HIGH (ref 70–99)
Potassium: 3.4 mmol/L — ABNORMAL LOW (ref 3.5–5.1)
Sodium: 140 mmol/L (ref 135–145)

## 2019-08-30 LAB — MAGNESIUM: Magnesium: 2.3 mg/dL (ref 1.7–2.4)

## 2019-08-30 LAB — GLUCOSE, CAPILLARY
Glucose-Capillary: 122 mg/dL — ABNORMAL HIGH (ref 70–99)
Glucose-Capillary: 140 mg/dL — ABNORMAL HIGH (ref 70–99)

## 2019-08-30 MED ORDER — AMLODIPINE BESYLATE 10 MG PO TABS: 10.0000 mg | ORAL_TABLET | Freq: Every day | ORAL | 0 refills | Status: DC

## 2019-08-30 MED ORDER — TORSEMIDE 20 MG PO TABS: 40.0000 mg | ORAL_TABLET | Freq: Every day | ORAL | 0 refills | Status: DC

## 2019-08-30 MED ORDER — ISOSORBIDE MONONITRATE ER 30 MG PO TB24: 90.0000 mg | ORAL_TABLET | Freq: Two times a day (BID) | ORAL | 0 refills | Status: DC

## 2019-08-30 MED ORDER — HYDRALAZINE HCL 50 MG PO TABS: 100.0000 mg | ORAL_TABLET | Freq: Three times a day (TID) | ORAL | 0 refills | Status: DC

## 2019-08-30 MED ORDER — POTASSIUM CHLORIDE CRYS ER 20 MEQ PO TBCR
20.0000 meq | EXTENDED_RELEASE_TABLET | Freq: Once | ORAL | Status: AC
  Administered 2019-08-30: 20 meq via ORAL
  Filled 2019-08-30: qty 1

## 2019-08-30 NOTE — Discharge Summary (Signed)
[facility] at [place] NAME: Renee [name]    MR#:  [id number]  DATE OF BIRTH:  [birth year]  DATE OF ADMISSION:  08/26/2019   ADMITTING PHYSICIAN: Nicholes Mango, MD  DATE OF DISCHARGE: 08/30/2019  PRIMARY CARE PHYSICIAN: Minda Ditto, MD   ADMISSION DIAGNOSIS:  Hypertensive urgency [I16.0] Congestive heart failure, unspecified HF chronicity, unspecified heart failure type ([place]) [I50.9] DISCHARGE DIAGNOSIS:  Principal Problem:   Hypertensive emergency Active Problems:   Diabetes mellitus, type 2 ([place])   Acute on chronic renal failure (H[facility])   Flash pulmonary edema ([place])  SECONDARY DIAGNOSIS:   Past Medical History:  Diagnosis Date  . (HFpEF) heart failure with preserved ejection fraction ([place])   . CKD (chronic kidney disease), stage IV ([place])   . Hypertension   . Morbid obesity [facility])    HOSPITAL COURSE:  Chief complaint; shortness of breath and chest discomfort  History of presenting complaint; Renee [name]  is a 43 y.o. female with a known history of hypertension and congestive heart failure follows up with the CHF clinic as an outpatient is presenting to the ED with a chief complaint of chest heaviness and significant swelling in the bilateral lower extremities and probably weight gain but patient could not tell how many pounds she has gained in the past few days.  Patient was admitted to medical service for further evaluation.  Hospital course; # Malignant HTN with end organ damage-noncompliant with medications Patient initially started on Cardene drip and admitted to ICU.  Cardene drip already weaned off and patient transferred out of ICU. Patient admitted to not taking her medications including the clonidine at the exact time she is scheduled to take it with strong potential for rebound hypertension.  Importance of compliance with medication regimen reemphasized.Blood pressure control improved with current meds. Patient is on  Coreg, Norvasc, hydralazine, clonidine, and torsemide.  Continue the same on discharge.  Follow-up with primary care physician in a few days to reassess blood pressure and adjust meds as needed. Echocardiogram preliminary report LVH with grossly normal EF  # AKI on CKD-3-from hypertensive nephropathy with uncontrolled malignant hypertension Nephrology is following the patient.  No uremic symptoms no acute indication for hemodialysis at this point. Renal ultrasound with no evidence of hydronephrosis and worsening of renal parenchyma compared to January 24, 2018 consistent with progressive medical renal disease Renal function remains fairly stable.  I discussed with nephrologist on-call today Dr. Theador Hawthorne who has given clearance to discharge.  Patient to follow-up with nephrology in clinic for ongoing monitoring of renal function.  # Acuteon chronicCHF Lasix discontinued and patient started on torsemide.  2D echocardiogram done with ejection fraction of 55 to 60%. Cardiologist following.  Volume status currently okay on current regimen. Noted very minimal elevation in troponin which is said to be likely due to demand ischemia in the setting of malignant hypertension.  Continue torsemide and Coreg on discharge.  Losartan discontinued due to recent worsening of renal function  #Hypokalemia Repleted prior to discharge   #Morbid obesity Advised lifestyle modification  DISCHARGE CONDITIONS:  Stable CONSULTS OBTAINED:  Treatment Team:  Minna Merritts, MD DRUG ALLERGIES:  No Known Allergies DISCHARGE MEDICATIONS:   Allergies as of 08/30/2019   No Known Allergies     Medication List    STOP taking these medications   furosemide 40 MG tablet Commonly known as: LASIX   losartan 100 MG tablet Commonly known as: COZAAR   spironolactone 25 MG  tablet Commonly known as: ALDACTONE     TAKE these medications   amLODipine 10 MG tablet Commonly known as: NORVASC Take 1 tablet (10 mg  total) by mouth daily. Start taking on: August 31, 2019   carvedilol 25 MG tablet Commonly known as: COREG Take 1 tablet (25 mg total) by mouth 2 (two) times daily with a meal.   cloNIDine 0.2 MG tablet Commonly known as: CATAPRES Take 1 tablet (0.2 mg total) by mouth 3 (three) times daily.   fluticasone 50 MCG/ACT nasal spray Commonly known as: Flonase Place 1 spray into both nostrils daily.   guaiFENesin-dextromethorphan 100-10 MG/5ML syrup Commonly known as: ROBITUSSIN DM Take 5 mLs by mouth every 4 (four) hours as needed for cough.   hydrALAZINE 50 MG tablet Commonly known as: APRESOLINE Take 2 tablets (100 mg total) by mouth 3 (three) times daily. What changed:   medication strength  when to take this   isosorbide mononitrate 30 MG 24 hr tablet Commonly known as: IMDUR Take 3 tablets (90 mg total) by mouth 2 (two) times daily. What changed:   how much to take  when to take this   torsemide 20 MG tablet Commonly known as: DEMADEX Take 2 tablets (40 mg total) by mouth daily. Start taking on: August 31, 2019   triamcinolone cream 0.1 % Commonly known as: KENALOG Apply 1 application topically 2 (two) times daily.        DISCHARGE INSTRUCTIONS:   DIET:  Low-sodium diet DISCHARGE CONDITION:  Stable ACTIVITY:  Activity as tolerated OXYGEN:  Home Oxygen: No.  Oxygen Delivery: room air DISCHARGE LOCATION:  home   If you experience worsening of your admission symptoms, develop shortness of breath, life threatening emergency, suicidal or homicidal thoughts you must seek medical attention immediately by calling 911 or calling your MD immediately  if symptoms less severe.  You Must read complete instructions/literature along with all the possible adverse reactions/side effects for all the Medicines you take and that have been prescribed to you. Take any new Medicines after you have completely understood and accpet all the possible adverse reactions/side  effects.   Please note  You were cared for by a hospitalist during your hospital stay. If you have any questions about your discharge medications or the care you received while you were in the hospital after you are discharged, you can call the unit and asked to speak with the hospitalist on call if the hospitalist that took care of you is not available. Once you are discharged, your primary care physician will handle any further medical issues. Please note that NO REFILLS for any discharge medications will be authorized once you are discharged, as it is imperative that you return to your primary care physician (or establish a relationship with a primary care physician if you do not have one) for your aftercare needs so that they can reassess your need for medications and monitor your lab values.    On the day of Discharge:  VITAL SIGNS:  Blood pressure (!) 167/81, pulse 62, temperature 98.5 F (36.9 C), temperature source Oral, resp. rate 16, height 5\' 5"  (1.651 m), weight 112.9 kg, SpO2 96 %. PHYSICAL EXAMINATION:  GENERAL:  43 y.o.-year-old patient lying in the bed with no acute distress.  EYES: Pupils equal, round, reactive to light and accommodation. No scleral icterus. Extraocular muscles intact.  HEENT: Head atraumatic, normocephalic. Oropharynx and nasopharynx clear.  NECK:  Supple, no jugular venous distention. No thyroid enlargement, no tenderness.  LUNGS:  Normal breath sounds bilaterally, no wheezing, rales,rhonchi or crepitation. No use of accessory muscles of respiration.  CARDIOVASCULAR: S1, S2 normal. No murmurs, rubs, or gallops.  ABDOMEN: Soft, non-tender, non-distended. Bowel sounds present. No organomegaly or mass.  EXTREMITIES: No pedal edema, cyanosis, or clubbing.  NEUROLOGIC: Cranial nerves II through XII are intact. Muscle strength 5/5 in all extremities. Sensation intact. Gait not checked.  PSYCHIATRIC: The patient is alert and oriented x 3.  SKIN: No obvious rash,  lesion, or ulcer.  DATA REVIEW:   CBC Recent Labs  Lab 08/29/19 0447  WBC 7.5  HGB 11.3*  HCT 36.5  PLT 297    Chemistries  Recent Labs  Lab 08/26/19 0451  08/30/19 0618  NA 141   < > 140  K 3.4*   < > 3.4*  CL 109   < > 103  CO2 23   < > 24  GLUCOSE 223*   < > 127*  BUN 59*   < > 70*  CREATININE 5.03*   < > 5.33*  CALCIUM 8.8*   < > 8.6*  MG  --    < > 2.3  AST 13*  --   --   ALT 19  --   --   ALKPHOS 126  --   --   BILITOT 0.8  --   --    < > = values in this interval not displayed.     Microbiology Results  Results for orders placed or performed during the hospital encounter of 08/26/19  Novel Coronavirus, NAA (Hosp order, Send-out to Ref Lab; TAT 18-24 hrs     Status: None   Collection Time: 08/26/19  9:10 AM   Specimen: Nasopharyngeal Swab; Respiratory  Result Value Ref Range Status   SARS-CoV-2, NAA NOT DETECTED NOT DETECTED Final    Comment: (NOTE) This test was developed and its performance characteristics determined by [employer]. This test has not been FDA cleared or approved. This test has been authorized by FDA under an Emergency Use Authorization (EUA). This test is only authorized for the duration of time the declaration that circumstances exist justifying the authorization of the emergency use of in vitro diagnostic tests for detection of SARS-CoV-2 virus and/or diagnosis of COVID-19 infection under section 564(b)(1) of the Act, 21 U.S.C. [id number]), unless the authorization is terminated or revoked sooner. When diagnostic testing is negative, the possibility of a false negative result should be considered in the context of a patient's recent exposures and the presence of clinical signs and symptoms consistent with COVID-19. An individual without symptoms of COVID-19 and who is not shedding SARS-CoV-2 virus would expect to have a negative (not detected) result in this assay. Performed  At: [facility] [address]  [place], [place] [id number] [name] MD [id number]    [place]  Final    Comment: Performed at [facility], [address] [place], [place] [address]  MRSA PCR Screening     Status: None   Collection Time: 08/26/19  2:11 PM   Specimen: Nasopharyngeal  Result Value Ref Range Status   MRSA by PCR NEGATIVE NEGATIVE Final    Comment:        The GeneXpert MRSA Assay (FDA approved for NASAL specimens only), is one component of a comprehensive MRSA colonization surveillance program. It is not intended to diagnose MRSA infection nor to guide or monitor treatment for MRSA infections. Performed at [facility], [address], [place], [place] [address]  RADIOLOGY:  No results found.   Management plans discussed with the patient, family and they are in agreement.  CODE STATUS: Full Code   TOTAL TIME TAKING CARE OF THIS PATIENT: 38 minutes.    Analaya Hoey M.D on 08/30/2019 at 12:42 PM  Between 7am to 6pm - Pager - [phone]  After 6pm go to [email or web address] - Proofreader  [facility]  Office  [phone]  CC: Primary care physician; Minda Ditto, MD   Note: This dictation was prepared with Dragon dictation along with smaller phrase technology. Any transcriptional errors that result from this process are unintentional.

## 2019-08-30 NOTE — Plan of Care (Signed)
  Problem: Education: Goal: Knowledge of General Education information will improve Description: Including pain rating scale, medication(s)/side effects and non-pharmacologic comfort measures Outcome: Completed/Met   Problem: Health Behavior/Discharge Planning: Goal: Ability to manage health-related needs will improve Outcome: Completed/Met   Problem: Clinical Measurements: Goal: Ability to maintain clinical measurements within normal limits will improve Outcome: Completed/Met Goal: Diagnostic test results will improve Outcome: Completed/Met Goal: Cardiovascular complication will be avoided Outcome: Completed/Met   Problem: Elimination: Goal: Will not experience complications related to bowel motility Outcome: Completed/Met Goal: Will not experience complications related to urinary retention Outcome: Completed/Met   Problem: Pain Managment: Goal: General experience of comfort will improve Outcome: Completed/Met   Problem: Safety: Goal: Ability to remain free from injury will improve Outcome: Completed/Met   Problem: Skin Integrity: Goal: Risk for impaired skin integrity will decrease Outcome: Completed/Met

## 2019-08-30 NOTE — Progress Notes (Signed)
Pt discharged to home via wc.  Instructions and rx given to pt.  Questions answered.  No distress.  

## 2019-08-30 NOTE — Progress Notes (Signed)
Renee [name]  MRN: [id number]  DOB/AGE: 1976-03-18 43 y.o.  Primary Care Physician:Patel, Grier Mitts, MD  Admit date: 08/26/2019  Chief Complaint:  Chief Complaint  Patient presents with  . Shortness of Breath    S-Pt presented on  08/26/2019 with  Chief Complaint  Patient presents with  . Shortness of Breath  .    Pt today feels better.  meds  . amLODipine  10 mg Oral Daily  . carvedilol  25 mg Oral BID WC  . Chlorhexidine Gluconate Cloth  6 each Topical Daily  . cloNIDine  0.2 mg Oral BID  . heparin  5,000 Units Subcutaneous Q8H  . hydrALAZINE  150 mg Oral TID  . insulin aspart  0-5 Units Subcutaneous QHS  . insulin aspart  0-9 Units Subcutaneous TID WC  . isosorbide mononitrate  90 mg Oral BID  . torsemide  40 mg Oral Daily         [id number] from the symptoms mentioned above,there are no other symptoms referable to all systems reviewed.  Physical Exam: Vital signs in last 24 hours: Temp:  [97.6 F (36.4 C)-98.2 F (36.8 C)] 98.2 F (36.8 C) (08/30 0441) Pulse Rate:  [65-70] 69 (08/30 0441) Resp:  [16-20] 20 (08/30 0441) BP: (156-186)/(77-94) 156/77 (08/30 0441) SpO2:  [94 %-97 %] 94 % (08/30 0441) Weight:  [112.9 kg] 112.9 kg (08/30 0439) Weight change: -1.179 kg Last BM Date: 08/28/19  Intake/Output from previous day: 08/29 0701 - 08/30 0700 In: 360 [P.O.:360] Out: 1650 [Urine:1650] No intake/output data recorded.   Physical Exam: General- pt is awake,alert, oriented to time place and person Resp- No acute REsp distress, CTA B/L NO Rhonchi CVS- S1S2 regular ij=n rate and rhythm GIT- BS+, soft, NT, ND, obese EXT- 1+ LE Edema, NO Cyanosis   Lab Results: CBC Recent Labs    08/28/19 0419 08/29/19 0447  WBC 7.7 7.5  HGB 10.9* 11.3*  HCT 35.9* 36.5  PLT 299 297    BMET Recent Labs    08/29/19 0447 08/30/19 0618  NA 139 140  K 3.6 3.4*  CL 104 103  CO2 24 24  GLUCOSE 165* 127*  BUN 65* 70*  CREATININE 5.17* 5.33*  CALCIUM 8.7*  8.6*    Creat trend 2020  4.9--5.3 2019  2.7--3.8 2018  2.4--3.1 2017  1.9--2.4 2016  1.5  MICRO Recent Results (from the past 240 hour(s))  Novel Coronavirus, NAA (Hosp order, Send-out to Ref Lab; TAT 18-24 hrs     Status: None   Collection Time: 08/26/19  9:10 AM   Specimen: Nasopharyngeal Swab; Respiratory  Result Value Ref Range Status   SARS-CoV-2, NAA NOT DETECTED NOT DETECTED Final    Comment: (NOTE) This test was developed and its performance characteristics determined by [employer]. This test has not been FDA cleared or approved. This test has been authorized by FDA under an Emergency Use Authorization (EUA). This test is only authorized for the duration of time the declaration that circumstances exist justifying the authorization of the emergency use of in vitro diagnostic tests for detection of SARS-CoV-2 virus and/or diagnosis of COVID-19 infection under section 564(b)(1) of the Act, 21 U.S.C. KA:123727), unless the authorization is terminated or revoked sooner. When diagnostic testing is negative, the possibility of a false negative result should be considered in the context of a patient's recent exposures and the presence of clinical signs and symptoms consistent with COVID-19. An individual without symptoms of COVID-19 and who is not shedding SARS-CoV-2 virus  would expect to have a negative (not detected) result in this assay. Performed  At: [facility] [address] [place], [place] [id number] [name] MD [id number]    [place]  Final    Comment: Performed at [facility], [address] [place], [place] [address]  MRSA PCR Screening     Status: None   Collection Time: 08/26/19  2:11 PM   Specimen: Nasopharyngeal  Result Value Ref Range Status   MRSA by PCR NEGATIVE NEGATIVE Final    Comment:        The GeneXpert MRSA Assay (FDA approved for NASAL specimens only), is one component of  a comprehensive MRSA colonization surveillance program. It is not intended to diagnose MRSA infection nor to guide or monitor treatment for MRSA infections. Performed at [facility], [address] [place], [place] [address]       Lab Results  Component Value Date   PTH 139 (H) 08/27/2019   CALCIUM 8.6 (L) 08/30/2019   PHOS 4.4 08/27/2019         Impression: 1)Renal  AKI vs CKD progression               CKD stage 5 .               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 Calcium when corrected for low albumin is at goal  5)CHF- diastolic On Diuretics Cardiology and  Primary MD following  6)Electrolytes  Hypokalemic   Will replte NOrmonatremic   7)Acid base Co2 at goal     Plan:  Will continue current care    [place] S 08/30/2019, 7:50 AM

## 2019-09-21 ENCOUNTER — Ambulatory Visit: Payer: Medicare Other | Admitting: Cardiology

## 2019-09-25 DIAGNOSIS — D631 Anemia in chronic kidney disease: Secondary | ICD-10-CM | POA: Diagnosis not present

## 2019-09-25 DIAGNOSIS — R809 Proteinuria, unspecified: Secondary | ICD-10-CM | POA: Diagnosis not present

## 2019-09-25 DIAGNOSIS — I129 Hypertensive chronic kidney disease with stage 1 through stage 4 chronic kidney disease, or unspecified chronic kidney disease: Secondary | ICD-10-CM | POA: Diagnosis not present

## 2019-09-25 DIAGNOSIS — N2581 Secondary hyperparathyroidism of renal origin: Secondary | ICD-10-CM | POA: Diagnosis not present

## 2019-09-25 DIAGNOSIS — N184 Chronic kidney disease, stage 4 (severe): Secondary | ICD-10-CM | POA: Diagnosis not present

## 2019-09-25 DIAGNOSIS — N189 Chronic kidney disease, unspecified: Secondary | ICD-10-CM | POA: Insufficient documentation

## 2019-09-25 DIAGNOSIS — E1122 Type 2 diabetes mellitus with diabetic chronic kidney disease: Secondary | ICD-10-CM | POA: Diagnosis not present

## 2019-09-25 DIAGNOSIS — N170 Acute kidney failure with tubular necrosis: Secondary | ICD-10-CM | POA: Diagnosis not present

## 2019-09-29 ENCOUNTER — Ambulatory Visit: Payer: Medicare Other | Admitting: Cardiology

## 2019-10-21 ENCOUNTER — Emergency Department: Payer: Medicare Other

## 2019-10-21 ENCOUNTER — Other Ambulatory Visit: Payer: Self-pay

## 2019-10-21 ENCOUNTER — Emergency Department
Admission: EM | Admit: 2019-10-21 | Discharge: 2019-10-21 | Disposition: A | Discharge status: Home / Self Care | Payer: Medicare Other | Attending: Emergency Medicine | Admitting: Emergency Medicine

## 2019-10-21 DIAGNOSIS — E1122 Type 2 diabetes mellitus with diabetic chronic kidney disease: Secondary | ICD-10-CM | POA: Insufficient documentation

## 2019-10-21 DIAGNOSIS — S0990XA Unspecified injury of head, initial encounter: Secondary | ICD-10-CM | POA: Diagnosis not present

## 2019-10-21 DIAGNOSIS — Y999 Unspecified external cause status: Secondary | ICD-10-CM | POA: Insufficient documentation

## 2019-10-21 DIAGNOSIS — M542 Cervicalgia: Secondary | ICD-10-CM | POA: Diagnosis not present

## 2019-10-21 DIAGNOSIS — Y939 Activity, unspecified: Secondary | ICD-10-CM | POA: Diagnosis not present

## 2019-10-21 DIAGNOSIS — N184 Chronic kidney disease, stage 4 (severe): Secondary | ICD-10-CM | POA: Insufficient documentation

## 2019-10-21 DIAGNOSIS — I5032 Chronic diastolic (congestive) heart failure: Secondary | ICD-10-CM | POA: Insufficient documentation

## 2019-10-21 DIAGNOSIS — S40012A Contusion of left shoulder, initial encounter: Secondary | ICD-10-CM

## 2019-10-21 DIAGNOSIS — Y92410 Unspecified street and highway as the place of occurrence of the external cause: Secondary | ICD-10-CM | POA: Insufficient documentation

## 2019-10-21 DIAGNOSIS — S4992XA Unspecified injury of left shoulder and upper arm, initial encounter: Secondary | ICD-10-CM | POA: Diagnosis not present

## 2019-10-21 DIAGNOSIS — Z79899 Other long term (current) drug therapy: Secondary | ICD-10-CM | POA: Diagnosis not present

## 2019-10-21 DIAGNOSIS — M25512 Pain in left shoulder: Secondary | ICD-10-CM | POA: Diagnosis not present

## 2019-10-21 DIAGNOSIS — R4020 Unspecified coma: Secondary | ICD-10-CM

## 2019-10-21 DIAGNOSIS — G44309 Post-traumatic headache, unspecified, not intractable: Secondary | ICD-10-CM | POA: Diagnosis not present

## 2019-10-21 DIAGNOSIS — G44319 Acute post-traumatic headache, not intractable: Secondary | ICD-10-CM | POA: Insufficient documentation

## 2019-10-21 DIAGNOSIS — I130 Hypertensive heart and chronic kidney disease with heart failure and stage 1 through stage 4 chronic kidney disease, or unspecified chronic kidney disease: Secondary | ICD-10-CM | POA: Diagnosis not present

## 2019-10-21 DIAGNOSIS — S199XXA Unspecified injury of neck, initial encounter: Secondary | ICD-10-CM | POA: Diagnosis not present

## 2019-10-21 DIAGNOSIS — R55 Syncope and collapse: Secondary | ICD-10-CM | POA: Diagnosis not present

## 2019-10-21 DIAGNOSIS — S069X9A Unspecified intracranial injury with loss of consciousness of unspecified duration, initial encounter: Secondary | ICD-10-CM | POA: Insufficient documentation

## 2019-10-21 MED ORDER — MELOXICAM 15 MG PO TABS: 15.0000 mg | ORAL_TABLET | Freq: Every day | ORAL | 0 refills | Status: DC

## 2019-10-21 MED ORDER — METHOCARBAMOL 500 MG PO TABS: 500.0000 mg | ORAL_TABLET | Freq: Four times a day (QID) | ORAL | 0 refills | Status: DC
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
## 2019-11-03 DIAGNOSIS — H3500 Unspecified background retinopathy: Secondary | ICD-10-CM | POA: Diagnosis not present

## 2019-11-03 DIAGNOSIS — H35033 Hypertensive retinopathy, bilateral: Secondary | ICD-10-CM | POA: Diagnosis not present

## 2019-11-16 DIAGNOSIS — I129 Hypertensive chronic kidney disease with stage 1 through stage 4 chronic kidney disease, or unspecified chronic kidney disease: Secondary | ICD-10-CM | POA: Insufficient documentation

## 2019-11-16 DIAGNOSIS — N2581 Secondary hyperparathyroidism of renal origin: Secondary | ICD-10-CM | POA: Diagnosis not present

## 2019-11-16 DIAGNOSIS — E1122 Type 2 diabetes mellitus with diabetic chronic kidney disease: Secondary | ICD-10-CM | POA: Diagnosis not present

## 2019-11-16 DIAGNOSIS — N185 Chronic kidney disease, stage 5: Secondary | ICD-10-CM | POA: Diagnosis not present

## 2019-11-16 DIAGNOSIS — R809 Proteinuria, unspecified: Secondary | ICD-10-CM | POA: Diagnosis not present

## 2019-11-16 DIAGNOSIS — I120 Hypertensive chronic kidney disease with stage 5 chronic kidney disease or end stage renal disease: Secondary | ICD-10-CM | POA: Diagnosis not present

## 2019-12-10 ENCOUNTER — Other Ambulatory Visit: Payer: Self-pay | Admitting: Ophthalmology

## 2019-12-10 DIAGNOSIS — H35033 Hypertensive retinopathy, bilateral: Secondary | ICD-10-CM | POA: Diagnosis not present

## 2019-12-10 DIAGNOSIS — H4710 Unspecified papilledema: Secondary | ICD-10-CM

## 2019-12-22 ENCOUNTER — Ambulatory Visit: Admission: RE | Admit: 2019-12-22 | Payer: No Typology Code available for payment source | Source: Ambulatory Visit

## 2020-01-18 ENCOUNTER — Emergency Department
Admission: EM | Admit: 2020-01-18 | Discharge: 2020-01-19 | Disposition: A | Discharge status: Other Acute Inpatient Hospital | Payer: Medicare Other | Attending: Emergency Medicine | Admitting: Emergency Medicine

## 2020-01-18 ENCOUNTER — Emergency Department: Payer: Medicare Other

## 2020-01-18 ENCOUNTER — Other Ambulatory Visit: Payer: Self-pay

## 2020-01-18 ENCOUNTER — Encounter: Payer: Self-pay | Admitting: Emergency Medicine

## 2020-01-18 DIAGNOSIS — R4781 Slurred speech: Secondary | ICD-10-CM | POA: Diagnosis not present

## 2020-01-18 DIAGNOSIS — R918 Other nonspecific abnormal finding of lung field: Secondary | ICD-10-CM | POA: Diagnosis not present

## 2020-01-18 DIAGNOSIS — N189 Chronic kidney disease, unspecified: Secondary | ICD-10-CM

## 2020-01-18 DIAGNOSIS — Z3202 Encounter for pregnancy test, result negative: Secondary | ICD-10-CM | POA: Insufficient documentation

## 2020-01-18 DIAGNOSIS — E1100 Type 2 diabetes mellitus with hyperosmolarity without nonketotic hyperglycemic-hyperosmolar coma (NKHHC): Secondary | ICD-10-CM

## 2020-01-18 DIAGNOSIS — I130 Hypertensive heart and chronic kidney disease with heart failure and stage 1 through stage 4 chronic kidney disease, or unspecified chronic kidney disease: Secondary | ICD-10-CM | POA: Diagnosis not present

## 2020-01-18 DIAGNOSIS — Z79899 Other long term (current) drug therapy: Secondary | ICD-10-CM | POA: Insufficient documentation

## 2020-01-18 DIAGNOSIS — R4020 Unspecified coma: Secondary | ICD-10-CM | POA: Diagnosis not present

## 2020-01-18 DIAGNOSIS — I10 Essential (primary) hypertension: Secondary | ICD-10-CM

## 2020-01-18 DIAGNOSIS — E1122 Type 2 diabetes mellitus with diabetic chronic kidney disease: Secondary | ICD-10-CM | POA: Insufficient documentation

## 2020-01-18 DIAGNOSIS — Z20822 Contact with and (suspected) exposure to covid-19: Secondary | ICD-10-CM | POA: Diagnosis not present

## 2020-01-18 DIAGNOSIS — R739 Hyperglycemia, unspecified: Secondary | ICD-10-CM

## 2020-01-18 DIAGNOSIS — E876 Hypokalemia: Secondary | ICD-10-CM | POA: Diagnosis not present

## 2020-01-18 DIAGNOSIS — R569 Unspecified convulsions: Secondary | ICD-10-CM | POA: Diagnosis not present

## 2020-01-18 DIAGNOSIS — N184 Chronic kidney disease, stage 4 (severe): Secondary | ICD-10-CM | POA: Insufficient documentation

## 2020-01-18 DIAGNOSIS — R29818 Other symptoms and signs involving the nervous system: Secondary | ICD-10-CM | POA: Diagnosis not present

## 2020-01-18 DIAGNOSIS — R55 Syncope and collapse: Secondary | ICD-10-CM | POA: Diagnosis not present

## 2020-01-18 DIAGNOSIS — I5032 Chronic diastolic (congestive) heart failure: Secondary | ICD-10-CM | POA: Diagnosis not present

## 2020-01-18 DIAGNOSIS — E1165 Type 2 diabetes mellitus with hyperglycemia: Secondary | ICD-10-CM | POA: Diagnosis not present

## 2020-01-18 DIAGNOSIS — I129 Hypertensive chronic kidney disease with stage 1 through stage 4 chronic kidney disease, or unspecified chronic kidney disease: Secondary | ICD-10-CM | POA: Diagnosis not present

## 2020-01-18 LAB — BLOOD GAS, VENOUS
Acid-base deficit: 5.3 mmol/L — ABNORMAL HIGH (ref 0.0–2.0)
Bicarbonate: 22 mmol/L (ref 20.0–28.0)
O2 Saturation: 91.5 %
Patient temperature: 37
pCO2, Ven: 49 mmHg (ref 44.0–60.0)
pH, Ven: 7.26 (ref 7.250–7.430)
pO2, Ven: 72 mmHg — ABNORMAL HIGH (ref 32.0–45.0)

## 2020-01-18 LAB — COMPREHENSIVE METABOLIC PANEL
ALT: 17 U/L (ref 0–44)
AST: 16 U/L (ref 15–41)
Albumin: 3.1 g/dL — ABNORMAL LOW (ref 3.5–5.0)
Alkaline Phosphatase: 182 U/L — ABNORMAL HIGH (ref 38–126)
Anion gap: 15 (ref 5–15)
BUN: 53 mg/dL — ABNORMAL HIGH (ref 6–20)
CO2: 20 mmol/L — ABNORMAL LOW (ref 22–32)
Calcium: 8.4 mg/dL — ABNORMAL LOW (ref 8.9–10.3)
Chloride: 82 mmol/L — ABNORMAL LOW (ref 98–111)
Creatinine, Ser: 4.61 mg/dL — ABNORMAL HIGH (ref 0.44–1.00)
GFR calc Af Amer: 13 mL/min — ABNORMAL LOW (ref >60)
GFR calc non Af Amer: 11 mL/min — ABNORMAL LOW (ref >60)
Glucose, Bld: 1200 mg/dL (ref 70–99)
Potassium: 3 mmol/L — ABNORMAL LOW (ref 3.5–5.1)
Sodium: 117 mmol/L — CL (ref 135–145)
Total Bilirubin: 0.8 mg/dL (ref 0.3–1.2)
Total Protein: 7.6 g/dL (ref 6.5–8.1)

## 2020-01-18 LAB — CBC WITH DIFFERENTIAL/PLATELET
Abs Immature Granulocytes: 0.06 10*3/uL (ref 0.00–0.07)
Basophils Absolute: 0 10*3/uL (ref 0.0–0.1)
Basophils Relative: 0 %
Eosinophils Absolute: 0.2 10*3/uL (ref 0.0–0.5)
Eosinophils Relative: 3 %
HCT: 36 % (ref 36.0–46.0)
Hemoglobin: 12 g/dL (ref 12.0–15.0)
Immature Granulocytes: 1 %
Lymphocytes Relative: 14 %
Lymphs Abs: 1 10*3/uL (ref 0.7–4.0)
MCH: 25.3 pg — ABNORMAL LOW (ref 26.0–34.0)
MCHC: 33.3 g/dL (ref 30.0–36.0)
MCV: 75.8 fL — ABNORMAL LOW (ref 80.0–100.0)
Monocytes Absolute: 0.5 10*3/uL (ref 0.1–1.0)
Monocytes Relative: 7 %
Neutro Abs: 5.3 10*3/uL (ref 1.7–7.7)
Neutrophils Relative %: 75 %
Platelets: 270 10*3/uL (ref 150–400)
RBC: 4.75 MIL/uL (ref 3.87–5.11)
RDW: 14.2 % (ref 11.5–15.5)
WBC: 7 10*3/uL (ref 4.0–10.5)
nRBC: 0 % (ref 0.0–0.2)

## 2020-01-18 LAB — TROPONIN I (HIGH SENSITIVITY): Troponin I (High Sensitivity): 50 ng/L — ABNORMAL HIGH (ref <18)

## 2020-01-18 LAB — APTT: aPTT: 33 seconds (ref 24–36)

## 2020-01-18 LAB — PROTIME-INR
INR: 0.9 (ref 0.8–1.2)
Prothrombin Time: 12.4 seconds (ref 11.4–15.2)

## 2020-01-18 LAB — GLUCOSE, CAPILLARY: Glucose-Capillary: >600 mg/dL (ref 70–99)

## 2020-01-18 MED ORDER — LORAZEPAM 2 MG/ML IJ SOLN
INTRAMUSCULAR | Status: AC
  Administered 2020-01-18: 2 mg via INTRAVENOUS
  Filled 2020-01-18: qty 1

## 2020-01-18 MED ORDER — SODIUM CHLORIDE 0.9 % IV SOLN: INTRAVENOUS | Status: DC

## 2020-01-18 MED ORDER — DEXTROSE 50 % IV SOLN: 0.0000 mL | INTRAVENOUS | Status: DC | PRN

## 2020-01-18 MED ORDER — POTASSIUM CHLORIDE 10 MEQ/100ML IV SOLN
10.0000 meq | INTRAVENOUS | Status: AC
  Administered 2020-01-18 – 2020-01-19 (×4): 10 meq via INTRAVENOUS
  Filled 2020-01-18 (×4): qty 100

## 2020-01-18 MED ORDER — LEVETIRACETAM IN NACL 1500 MG/100ML IV SOLN
1500.0000 mg | Freq: Once | INTRAVENOUS | Status: AC
  Administered 2020-01-18: 1500 mg via INTRAVENOUS
  Filled 2020-01-18: qty 100

## 2020-01-18 MED ORDER — SODIUM CHLORIDE 0.9 % IV SOLN: 3000.0000 mg | Freq: Once | INTRAVENOUS | Status: DC

## 2020-01-18 MED ORDER — LABETALOL HCL 5 MG/ML IV SOLN
INTRAVENOUS | Status: AC
  Administered 2020-01-18: 20 mg
  Filled 2020-01-18: qty 4

## 2020-01-18 MED ORDER — SODIUM CHLORIDE 0.9 % IV BOLUS
1000.0000 mL | Freq: Once | INTRAVENOUS | Status: AC
  Administered 2020-01-18: 1000 mL via INTRAVENOUS

## 2020-01-18 MED ORDER — LORAZEPAM 2 MG/ML IJ SOLN: 2.0000 mg | Freq: Once | INTRAMUSCULAR | Status: AC

## 2020-01-18 MED ORDER — INSULIN REGULAR(HUMAN) IN NACL 100-0.9 UT/100ML-% IV SOLN
INTRAVENOUS | Status: DC
  Administered 2020-01-19: 11.5 [IU]/h via INTRAVENOUS
  Filled 2020-01-18: qty 100

## 2020-01-18 MED ORDER — DEXTROSE-NACL 5-0.45 % IV SOLN: INTRAVENOUS | Status: DC

## 2020-01-18 NOTE — ED Notes (Signed)
Staff at bedside:  [place], RN Larene [place], RN Dr. Archie Balboa This nurse, [place] RN

## 2020-01-18 NOTE — ED Notes (Signed)
Neurologist states this is status epilepticus at this time, code stroke cancelled.

## 2020-01-18 NOTE — ED Notes (Signed)
Pt appears to have seizure at this time, tele neuro doctor states he believes its a seizure as well. Dr. Archie Balboa order

## 2020-01-18 NOTE — ED Notes (Signed)
This nurse back from CT scan and telestroke monitor in room with telenurse on.

## 2020-01-18 NOTE — ED Triage Notes (Signed)
Pt arrival via [facility] from home due to sudden onset slurred speech. Pt was at home getting her nails done by her mom when suddenly her speech became very slurred. Pt went unresponsive and became lethargic with some foaming at her mouth.  VS with EMS: Bp- 260/120 t- 98.5 Blood sugar- 445  Last known well is 8:45 pm.   Pt has history of HTN and pre-diabetes.

## 2020-01-18 NOTE — ED Notes (Signed)
Seizure pads placed on pt stretcher at this time.

## 2020-01-18 NOTE — Progress Notes (Signed)
Pt unable to be interviewed due to confusion. Pt uses [facility] but can not verify if pt is still taking medications. If confusion clears, we may be able to speak with pt. No family listed on pt chart to contact.

## 2020-01-18 NOTE — Consult Note (Addendum)
[facility] Tele[facility]urology Consult Services   Date of Service:   01/18/2020 22:05:36  Impression:     .  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 HCT showed no acute process. Etiology is multifactorial with AKI on CKD in the setting hypertensive encephalopathy or severe hyperglycemia with blood glucose above 600. Agree with ativan up to 2mg  at a time. Ordered keppra Load 60mg /kg Ideal body weight. If patient continues to seize or doesn't have an examination to follow would recommend cEEG.  Metrics: Last Known Well: 01/18/2020 19:00:00 [facility] Notification Time: 01/18/2020 22:05:36 Arrival Time: 01/18/2020 21:37:00 Stamp Time: 01/18/2020 22:05:36 Time First Login Attempt: 01/18/2020 22:11:10 Symptoms: Seizures NIHSS Start Assessment Time: 01/18/2020 22:12:00 Patient is not a candidate for Alteplase/Activase. Patient was not deemed candidate for Alteplase/Activase thrombolytics because of Status epilepticus.  CT head showed no acute hemorrhage or acute core infarct.  Clinical Presentation is not Suggestive of Large Vessel Occlusive Disease  ED Physician notified of diagnostic impression and management plan on 01/18/2020 22:29:09  Our recommendations are outlined below.  Recommendations:     Marland Kitchen  Metabolic work up per ED MD     .  Keppra Load    Sign Out:     .  Discussed with Emergency Department Provider    ------------------------------------------------------------------------------  History of Present Illness: Patient is a 44 year old Female.  Patient was brought by EMS for symptoms of Seizures  Past Medical history of CKD, Hypertension presented with status epilepticus. History was provided by Sherryll Burger (daughter) [phone]. She stated  that she was her normal self today was washing dishes around 2000 then she sat down to get her nail done. Where she suddenly had behavior arrest and was looking like she was bout to fall then the left side of her face then body started to twitched. SHe was brought in by EMS  Last seen normal was within 4.5 hours. There is no history of hemorrhagic complications or intracranial hemorrhage. There is no history of Recent Anticoagulants. There is no history of recent major surgery. There is no history of recent stroke.  Past Medical History:     . Hypertension     . Diabetes Mellitus     . Hyperlipidemia     . There is NO history of Atrial Fibrillation     . There is NO history of Coronary Artery Disease     . There is NO history of Stroke  Anticoagulant use:  No  Antiplatelet use: No     Examination: BP(202/96), Pulse(75), Blood Glucose(1200) 1A: Level of Consciousness - Requires repeated stimulation to arouse + 2 1B: Ask Month and Age - Could Not Answer Either Question Correctly + 2 1C: Blink Eyes & Squeeze Hands - Performs 0 Tasks + 2 2: Test Horizontal Extraocular Movements - Normal + 0 3: Test Visual Fields - No Visual Loss + 0 4: Test Facial Palsy (Use Grimace if Obtunded) - Minor paralysis (flat nasolabial fold, smile asymmetry) + 1 5A: Test Left Arm Motor Drift - Drift, hits bed + 2 5B: Test Right Arm Motor Drift - Drift, hits bed + 2 6A: Test Left Leg Motor Drift - No Movement + 4 6B: Test Right Leg Motor Drift - No Movement + 4 7: Test Limb Ataxia (FNF/Heel-Shin) - No Ataxia + 0  8: Test Sensation - Mild-Moderate Loss: Less Sharp/More Dull + 1 9: Test Language/Aphasia - Mute/Global Aphasia: No Usable Speech/Auditory Comprehension + 3 10: Test Dysarthria - Mute/Anarthric + 2 11: Test Extinction/Inattention - No abnormality + 0  NIHSS Score: 25  Pre-Morbid Modified Ranking Scale: 0 Points = No symptoms at all  Patient/Family was informed the Neurology Consult would  happen via TeleHealth consult by way of interactive audio and video telecommunications and consented to receiving care in this manner.   Due to the immediate potential for life-threatening deterioration due to underlying acute neurologic illness, I spent 30 minutes providing critical care. This time includes time for face to face visit via telemedicine, review of medical records, imaging studies and discussion of findings with providers, the patient and/or family.   Dr Hinda Lenis Kevona Lupinacci   [facility] [phone]  Case [id number]

## 2020-01-18 NOTE — ED Notes (Addendum)
Pt placed back on 4 L of oxygen at this time, tolerating well with O2 sat at 95%.

## 2020-01-18 NOTE — ED Notes (Signed)
Pt placed on non-rebreather while having a seizure, suction at bedside at patient turned to side.

## 2020-01-18 NOTE — ED Notes (Addendum)
Code Stroke called by Dr. Archie Balboa.

## 2020-01-18 NOTE — ED Notes (Signed)
Pt slightly confused and unable to follow commands. Pt answered some questions to start and is now more confused.

## 2020-01-18 NOTE — ED Notes (Signed)
Pt given second dose of 2 mg of ativan per Dr. Archie Balboa.

## 2020-01-18 NOTE — ED Notes (Signed)
10 mg of labetolol given to patient per Dr. Archie Balboa order.

## 2020-01-18 NOTE — ED Provider Notes (Signed)
[facility] Emergency Department Provider Note  ____________________________________________   I have reviewed the triage vital signs and the nursing notes.   HISTORY  Chief Complaint Weakness   History limited by and level 5 caveat due to: Slurred speech   HPI Renee [name] is a 44 y.o. female who presents to the emergency department today because of concern for slurred speech. The patient was having her nails done by her daughter when apparently the patient had a brief episode of unresponsiveness. Daughter stated that she felt that there was some jerking movements, however when the patient awoke she did not appear confused. She did however have slurred speech. EMS found the patient to have high blood pressure and elevated blood sugar. The patient states she has history of hypertension and prediabetes, but has never been put on medication for blood sugar. She denies any recent illness. Denies any headache.   Records reviewed. Per medical record review patient has a history of CKD, HTN. HTN emergency.   Past Medical History:  Diagnosis Date  . (HFpEF) heart failure with preserved ejection fraction ([place])   . CKD (chronic kidney disease), stage IV ([place])   . Hypertension   . Morbid obesity [facility])     Patient Active Problem List   Diagnosis Date Noted  . Flash pulmonary edema ([place]) 08/27/2019  . HTN (hypertension), malignant 08/26/2019  . HTN (hypertension) 01/29/2019  . Chronic diastolic heart failure ([place]) 08/14/2017  . Hypertensive emergency 07/05/2017  . 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 Acute on chronic renal failure ([place]) 06/14/2017  . Hyponatremia 06/14/2017  . Hypokalemia 06/14/2017  . Leg swelling 06/14/2017  . Cardiomyopathy ([place]) 04/08/2015  . Accelerated hypertension 04/08/2015  . Diabetes mellitus, type 2 ([place]) 04/08/2015    Past Surgical History:  Procedure Laterality Date  . CESAREAN SECTION    . TUBAL LIGATION      Prior to  Admission medications   Medication Sig Start Date End Date Taking? Authorizing Provider  amLODipine (NORVASC) 10 MG tablet Take 1 tablet (10 mg total) by mouth daily. 08/31/19 09/30/19  Otila Back, MD  carvedilol (COREG) 25 MG tablet Take 1 tablet (25 mg total) by mouth 2 (two) times daily with a meal. 02/11/19   Alisa Graff, FNP  cloNIDine (CATAPRES) 0.2 MG tablet Take 1 tablet (0.2 mg total) by mouth 3 (three) times daily. 07/09/17   Fritzi Mandes, MD  fluticasone (FLONASE) 50 MCG/ACT nasal spray Place 1 spray into both nostrils daily. 01/25/18 02/11/19  Dustin Flock, MD  guaiFENesin-dextromethorphan (ROBITUSSIN DM) 100-10 MG/5ML syrup Take 5 mLs by mouth every 4 (four) hours as needed for cough. Patient not taking: Reported on 08/26/2019 02/02/19   Luvenia Redden, PA-C  hydrALAZINE (APRESOLINE) 50 MG tablet Take 2 tablets (100 mg total) by mouth 3 (three) times daily. 08/30/19 09/29/19  Stark Jock Jude, MD  isosorbide mononitrate (IMDUR) 30 MG 24 hr tablet Take 3 tablets (90 mg total) by mouth 2 (two) times daily. 08/30/19 09/29/19  Stark Jock Jude, MD  meloxicam (MOBIC) 15 MG tablet Take 1 tablet (15 mg total) by mouth daily. 10/21/19   Cuthriell, Charline Bills, PA-C  methocarbamol (ROBAXIN) 500 MG tablet Take 1 tablet (500 mg total) by mouth 4 (four) times daily. 10/21/19   Cuthriell, Charline Bills, PA-C  torsemide (DEMADEX) 20 MG tablet Take 2 tablets (40 mg total) by mouth daily. 08/31/19 09/30/19  Stark Jock Jude, MD  triamcinolone cream (KENALOG) 0.1 % Apply 1 application topically 2 (two) times daily. 08/21/19  [provider]    Allergies Patient has no known allergies.  Family History  Problem Relation Age of Onset  . Epilepsy Mother   . Hypertension Mother   . Hypertension Father     Social History Social History   Tobacco Use  . Smoking status: Never Smoker  . Smokeless tobacco: Never Used  Substance Use Topics  . Alcohol use: No  . Drug use: No    Review of Systems Constitutional:  No fever/chills Eyes: No visual changes. ENT: No sore throat. Cardiovascular: Denies chest pain. Respiratory: Denies shortness of breath. Gastrointestinal: No abdominal pain.  No nausea, no vomiting.  No diarrhea.   Genitourinary: Negative for dysuria. Musculoskeletal: Negative for back pain. Skin: Negative for rash. Neurological: Slurred speech.  ____________________________________________   PHYSICAL EXAM:  VITAL SIGNS: ED Triage Vitals  Enc Vitals Group     BP 01/18/20 2150 (!) 202/96     Pulse Rate 01/18/20 2150 75     Resp 01/18/20 2150 18     Temp 01/18/20 2150 98.6 F (37 C)     Temp Source 01/18/20 2150 Oral     SpO2 01/18/20 2150 96 %     Weight 01/18/20 2145 246 lb 14.6 oz (112 kg)     Height 01/18/20 2145 5\' 5"  (1.651 m)     Head Circumference --      Peak Flow --      Pain Score 01/18/20 2144 0   Constitutional: Alert and oriented.  Eyes: Conjunctivae are normal.  ENT      Head: Normocephalic and atraumatic.      Nose: No congestion/rhinnorhea.      Mouth/Throat: Mucous membranes are moist.      Neck: No stridor. Hematological/Lymphatic/Immunilogical: No cervical lymphadenopathy. Cardiovascular: Normal rate, regular rhythm.  No murmurs, rubs, or gallops.  Respiratory: Normal respiratory effort without tachypnea nor retractions. Breath sounds are clear and equal bilaterally. No wheezes/rales/rhonchi. Gastrointestinal: Soft and non tender. No rebound. No guarding.  Genitourinary: Deferred Musculoskeletal: Normal range of motion in all extremities. No lower extremity edema. Neurologic: Slurred speech. PERRL. EOMI. Strength 5/5 in upper and lower extremity. Sensation intact.  Skin:  Skin is warm, dry and intact. No rash noted.  ____________________________________________    LABS (pertinent positives/negatives)  Trop hs 50 VBG pH 7.26 CMP na 117, k 3.0, glu 1,200, cr 4.61 CBC wbc 7.0, hgb 12.0, plt  270 ____________________________________________   EKG  I, Nance Pear, attending physician, personally viewed and interpreted this EKG  EKG Time: 2149 Rate: 75 Rhythm: sinus rhythm Axis: left axis deviation Intervals: qtc 523 QRS: narrow, LVH ST changes: no st elevation Impression: abnormal ekg ____________________________________________    RADIOLOGY  CT head Normal head ct  I, Nance Pear, personally discussed these images and results by phone with the on-call radiologist and used this discussion as part of my medical decision making.   ____________________________________________   PROCEDURES  Procedures  CRITICAL CARE Performed by: Nance Pear   Total critical care time: 45 minutes  Critical care time was exclusive of separately billable procedures and treating other patients.  Critical care was necessary to treat or prevent imminent or life-threatening deterioration.  Critical care was time spent personally by me on the following activities: development of treatment plan with patient and/or surrogate as well as nursing, discussions with consultants, evaluation of patient's response to treatment, examination of patient, obtaining history from patient or surrogate, ordering and performing treatments and interventions, ordering and review of laboratory studies, ordering  and review of radiographic studies, pulse oximetry and re-evaluation of patient's condition.  ____________________________________________   INITIAL IMPRESSION / ASSESSMENT AND PLAN / ED COURSE  Pertinent labs & imaging results that were available during my care of the patient were reviewed by me and considered in my medical decision making (see chart for details).   Patient presented to the emergency department today via EMS after apparent episode of unresponsiveness at home.  On initial exam patient was awake and alert.  She was able to answer questions appropriately however had  notable slurred speech.  Did initially have concerns for possible stroke given slurred speech thus code stroke was called.  The patient did have elevated blood pressure when she arrived so labetalol was given and to help decrease her blood pressure in the event that TPA would be recommended.  While neurologist was evaluated the patient had seizures.  The seizure started initially with face twitching.  She was initially given 2 mg Ativan.  She then had further seizure type activity.  She was given 2 more milligrams of Ativan and per neurologist recommendation 3 g of Keppra.  Patient does not have history of seizures.  Work-up was notable for significantly elevated glucose.  Patient had stated that she had a history of prediabetes but has never been put on any sugar medication.  No elevated anion gap.  Patient's potassium was found to be low at 3.  Because of the low potassium insulin drip was deleted.  Patient was given IV potassium.  Will recheck potassium and when appropriate start IV insulin.  Given new onset seizures and concern for possible status did discuss with [facility] in [place] where they have the capability of continuous EEG.  They have accepted the patient in transfer.  ____________________________________________   FINAL CLINICAL IMPRESSION(S) / ED DIAGNOSES  Final diagnoses:  Seizures ([place])  Hyperglycemia  Hypertension, unspecified type     Note: This dictation was prepared with Dragon dictation. Any transcriptional errors that result from this process are unintentional     Nance Pear, MD 01/19/20 0011

## 2020-01-18 NOTE — ED Notes (Signed)
Pt arrives to CT scan wth this RN.

## 2020-01-18 NOTE — ED Notes (Signed)
Neuro doctor on telestroke monitor at this time.   Pt seeming to be having a seizure, ativan at bedside with Dr. Archie Balboa.

## 2020-01-18 NOTE — ED Notes (Signed)
Pt given 2 mg ativan given per Dr. Archie Balboa order.

## 2020-01-18 NOTE — ED Notes (Signed)
1000 mL bolus of normal saline started.

## 2020-01-19 ENCOUNTER — Inpatient Hospital Stay (HOSPITAL_COMMUNITY): Payer: Medicare Other

## 2020-01-19 ENCOUNTER — Inpatient Hospital Stay (HOSPITAL_COMMUNITY)
Admission: EM | Admit: 2020-01-19 | Discharge: 2020-01-23 | DRG: 100 | Disposition: A | Discharge status: Home / Self Care | Payer: Medicare Other | Source: Other Acute Inpatient Hospital | Attending: Internal Medicine | Admitting: Internal Medicine

## 2020-01-19 ENCOUNTER — Emergency Department: Payer: Medicare Other

## 2020-01-19 DIAGNOSIS — Z20822 Contact with and (suspected) exposure to covid-19: Secondary | ICD-10-CM | POA: Diagnosis present

## 2020-01-19 DIAGNOSIS — Z820 Family history of epilepsy and other diseases of the nervous system: Secondary | ICD-10-CM

## 2020-01-19 DIAGNOSIS — N184 Chronic kidney disease, stage 4 (severe): Secondary | ICD-10-CM | POA: Diagnosis not present

## 2020-01-19 DIAGNOSIS — I5032 Chronic diastolic (congestive) heart failure: Secondary | ICD-10-CM | POA: Diagnosis present

## 2020-01-19 DIAGNOSIS — Z98891 History of uterine scar from previous surgery: Secondary | ICD-10-CM | POA: Diagnosis not present

## 2020-01-19 DIAGNOSIS — N189 Chronic kidney disease, unspecified: Secondary | ICD-10-CM | POA: Diagnosis present

## 2020-01-19 DIAGNOSIS — E785 Hyperlipidemia, unspecified: Secondary | ICD-10-CM | POA: Diagnosis present

## 2020-01-19 DIAGNOSIS — R569 Unspecified convulsions: Secondary | ICD-10-CM

## 2020-01-19 DIAGNOSIS — Z791 Long term (current) use of non-steroidal anti-inflammatories (NSAID): Secondary | ICD-10-CM | POA: Diagnosis not present

## 2020-01-19 DIAGNOSIS — I10 Essential (primary) hypertension: Secondary | ICD-10-CM | POA: Diagnosis not present

## 2020-01-19 DIAGNOSIS — Z8249 Family history of ischemic heart disease and other diseases of the circulatory system: Secondary | ICD-10-CM | POA: Diagnosis not present

## 2020-01-19 DIAGNOSIS — R918 Other nonspecific abnormal finding of lung field: Secondary | ICD-10-CM | POA: Diagnosis not present

## 2020-01-19 DIAGNOSIS — E119 Type 2 diabetes mellitus without complications: Secondary | ICD-10-CM

## 2020-01-19 DIAGNOSIS — E1100 Type 2 diabetes mellitus with hyperosmolarity without nonketotic hyperglycemic-hyperosmolar coma (NKHHC): Secondary | ICD-10-CM | POA: Diagnosis not present

## 2020-01-19 DIAGNOSIS — J9811 Atelectasis: Secondary | ICD-10-CM | POA: Diagnosis not present

## 2020-01-19 DIAGNOSIS — I130 Hypertensive heart and chronic kidney disease with heart failure and stage 1 through stage 4 chronic kidney disease, or unspecified chronic kidney disease: Secondary | ICD-10-CM | POA: Diagnosis present

## 2020-01-19 DIAGNOSIS — E1101 Type 2 diabetes mellitus with hyperosmolarity with coma: Secondary | ICD-10-CM | POA: Diagnosis not present

## 2020-01-19 DIAGNOSIS — N179 Acute kidney failure, unspecified: Secondary | ICD-10-CM | POA: Diagnosis present

## 2020-01-19 DIAGNOSIS — Z7952 Long term (current) use of systemic steroids: Secondary | ICD-10-CM | POA: Diagnosis not present

## 2020-01-19 DIAGNOSIS — Z6841 Body Mass Index (BMI) 40.0 and over, adult: Secondary | ICD-10-CM

## 2020-01-19 DIAGNOSIS — E1122 Type 2 diabetes mellitus with diabetic chronic kidney disease: Secondary | ICD-10-CM | POA: Diagnosis present

## 2020-01-19 DIAGNOSIS — Z79899 Other long term (current) drug therapy: Secondary | ICD-10-CM | POA: Diagnosis not present

## 2020-01-19 DIAGNOSIS — E876 Hypokalemia: Secondary | ICD-10-CM | POA: Diagnosis present

## 2020-01-19 DIAGNOSIS — E1165 Type 2 diabetes mellitus with hyperglycemia: Secondary | ICD-10-CM

## 2020-01-19 DIAGNOSIS — G40401 Other generalized epilepsy and epileptic syndromes, not intractable, with status epilepticus: Principal | ICD-10-CM | POA: Diagnosis present

## 2020-01-19 DIAGNOSIS — D649 Anemia, unspecified: Secondary | ICD-10-CM | POA: Diagnosis present

## 2020-01-19 DIAGNOSIS — E871 Hypo-osmolality and hyponatremia: Secondary | ICD-10-CM | POA: Diagnosis present

## 2020-01-19 DIAGNOSIS — I161 Hypertensive emergency: Secondary | ICD-10-CM | POA: Diagnosis not present

## 2020-01-19 DIAGNOSIS — G40901 Epilepsy, unspecified, not intractable, with status epilepticus: Secondary | ICD-10-CM | POA: Diagnosis not present

## 2020-01-19 DIAGNOSIS — Z9851 Tubal ligation status: Secondary | ICD-10-CM | POA: Diagnosis not present

## 2020-01-19 DIAGNOSIS — G9341 Metabolic encephalopathy: Secondary | ICD-10-CM | POA: Diagnosis present

## 2020-01-19 LAB — GLUCOSE, CAPILLARY
Glucose-Capillary: 108 mg/dL — ABNORMAL HIGH (ref 70–99)
Glucose-Capillary: 112 mg/dL — ABNORMAL HIGH (ref 70–99)
Glucose-Capillary: 125 mg/dL — ABNORMAL HIGH (ref 70–99)
Glucose-Capillary: 127 mg/dL — ABNORMAL HIGH (ref 70–99)
Glucose-Capillary: 134 mg/dL — ABNORMAL HIGH (ref 70–99)
Glucose-Capillary: 155 mg/dL — ABNORMAL HIGH (ref 70–99)
Glucose-Capillary: 157 mg/dL — ABNORMAL HIGH (ref 70–99)
Glucose-Capillary: 167 mg/dL — ABNORMAL HIGH (ref 70–99)
Glucose-Capillary: 172 mg/dL — ABNORMAL HIGH (ref 70–99)
Glucose-Capillary: 206 mg/dL — ABNORMAL HIGH (ref 70–99)
Glucose-Capillary: 339 mg/dL — ABNORMAL HIGH (ref 70–99)
Glucose-Capillary: 528 mg/dL (ref 70–99)
Glucose-Capillary: >600 mg/dL (ref 70–99)
Glucose-Capillary: >600 mg/dL (ref 70–99)
Glucose-Capillary: >600 mg/dL (ref 70–99)
Glucose-Capillary: >600 mg/dL (ref 70–99)
Glucose-Capillary: >600 mg/dL (ref 70–99)
Glucose-Capillary: >600 mg/dL (ref 70–99)
Glucose-Capillary: >600 mg/dL (ref 70–99)

## 2020-01-19 LAB — BASIC METABOLIC PANEL
Anion gap: 12 (ref 5–15)
Anion gap: 10 (ref 5–15)
Anion gap: 13 (ref 5–15)
Anion gap: 11 (ref 5–15)
Anion gap: 10 (ref 5–15)
Anion gap: 9 (ref 5–15)
Anion gap: 9 (ref 5–15)
BUN: 53 mg/dL — ABNORMAL HIGH (ref 6–20)
BUN: 54 mg/dL — ABNORMAL HIGH (ref 6–20)
BUN: 54 mg/dL — ABNORMAL HIGH (ref 6–20)
BUN: 51 mg/dL — ABNORMAL HIGH (ref 6–20)
BUN: 51 mg/dL — ABNORMAL HIGH (ref 6–20)
BUN: 49 mg/dL — ABNORMAL HIGH (ref 6–20)
BUN: 49 mg/dL — ABNORMAL HIGH (ref 6–20)
CO2: 21 mmol/L — ABNORMAL LOW (ref 22–32)
CO2: 21 mmol/L — ABNORMAL LOW (ref 22–32)
CO2: 19 mmol/L — ABNORMAL LOW (ref 22–32)
CO2: 19 mmol/L — ABNORMAL LOW (ref 22–32)
CO2: 20 mmol/L — ABNORMAL LOW (ref 22–32)
CO2: 18 mmol/L — ABNORMAL LOW (ref 22–32)
CO2: 18 mmol/L — ABNORMAL LOW (ref 22–32)
Calcium: 8.7 mg/dL — ABNORMAL LOW (ref 8.9–10.3)
Calcium: 8.5 mg/dL — ABNORMAL LOW (ref 8.9–10.3)
Calcium: 8.8 mg/dL — ABNORMAL LOW (ref 8.9–10.3)
Calcium: 9 mg/dL (ref 8.9–10.3)
Calcium: 8.7 mg/dL — ABNORMAL LOW (ref 8.9–10.3)
Calcium: 8.9 mg/dL (ref 8.9–10.3)
Calcium: 8.7 mg/dL — ABNORMAL LOW (ref 8.9–10.3)
Chloride: 88 mmol/L — ABNORMAL LOW (ref 98–111)
Chloride: 96 mmol/L — ABNORMAL LOW (ref 98–111)
Chloride: 96 mmol/L — ABNORMAL LOW (ref 98–111)
Chloride: 102 mmol/L (ref 98–111)
Chloride: 101 mmol/L (ref 98–111)
Chloride: 107 mmol/L (ref 98–111)
Chloride: 108 mmol/L (ref 98–111)
Creatinine, Ser: 4.5 mg/dL — ABNORMAL HIGH (ref 0.44–1.00)
Creatinine, Ser: 4.46 mg/dL — ABNORMAL HIGH (ref 0.44–1.00)
Creatinine, Ser: 4.44 mg/dL — ABNORMAL HIGH (ref 0.44–1.00)
Creatinine, Ser: 4.38 mg/dL — ABNORMAL HIGH (ref 0.44–1.00)
Creatinine, Ser: 4.28 mg/dL — ABNORMAL HIGH (ref 0.44–1.00)
Creatinine, Ser: 4.18 mg/dL — ABNORMAL HIGH (ref 0.44–1.00)
Creatinine, Ser: 4.35 mg/dL — ABNORMAL HIGH (ref 0.44–1.00)
GFR calc Af Amer: 13 mL/min — ABNORMAL LOW (ref >60)
GFR calc Af Amer: 13 mL/min — ABNORMAL LOW (ref >60)
GFR calc Af Amer: 13 mL/min — ABNORMAL LOW (ref >60)
GFR calc Af Amer: 13 mL/min — ABNORMAL LOW (ref >60)
GFR calc Af Amer: 14 mL/min — ABNORMAL LOW (ref >60)
GFR calc Af Amer: 14 mL/min — ABNORMAL LOW (ref >60)
GFR calc Af Amer: 13 mL/min — ABNORMAL LOW (ref >60)
GFR calc non Af Amer: 11 mL/min — ABNORMAL LOW (ref >60)
GFR calc non Af Amer: 11 mL/min — ABNORMAL LOW (ref >60)
GFR calc non Af Amer: 11 mL/min — ABNORMAL LOW (ref >60)
GFR calc non Af Amer: 11 mL/min — ABNORMAL LOW (ref >60)
GFR calc non Af Amer: 12 mL/min — ABNORMAL LOW (ref >60)
GFR calc non Af Amer: 12 mL/min — ABNORMAL LOW (ref >60)
GFR calc non Af Amer: 12 mL/min — ABNORMAL LOW (ref >60)
Glucose, Bld: 1035 mg/dL (ref 70–99)
Glucose, Bld: 772 mg/dL (ref 70–99)
Glucose, Bld: 590 mg/dL (ref 70–99)
Glucose, Bld: 288 mg/dL — ABNORMAL HIGH (ref 70–99)
Glucose, Bld: 125 mg/dL — ABNORMAL HIGH (ref 70–99)
Glucose, Bld: 139 mg/dL — ABNORMAL HIGH (ref 70–99)
Glucose, Bld: 105 mg/dL — ABNORMAL HIGH (ref 70–99)
Potassium: 3.7 mmol/L (ref 3.5–5.1)
Potassium: 2.8 mmol/L — ABNORMAL LOW (ref 3.5–5.1)
Potassium: 3 mmol/L — ABNORMAL LOW (ref 3.5–5.1)
Potassium: 2.7 mmol/L — CL (ref 3.5–5.1)
Potassium: 3 mmol/L — ABNORMAL LOW (ref 3.5–5.1)
Potassium: 3.4 mmol/L — ABNORMAL LOW (ref 3.5–5.1)
Potassium: 3.1 mmol/L — ABNORMAL LOW (ref 3.5–5.1)
Sodium: 121 mmol/L — ABNORMAL LOW (ref 135–145)
Sodium: 127 mmol/L — ABNORMAL LOW (ref 135–145)
Sodium: 128 mmol/L — ABNORMAL LOW (ref 135–145)
Sodium: 132 mmol/L — ABNORMAL LOW (ref 135–145)
Sodium: 131 mmol/L — ABNORMAL LOW (ref 135–145)
Sodium: 134 mmol/L — ABNORMAL LOW (ref 135–145)
Sodium: 135 mmol/L (ref 135–145)

## 2020-01-19 LAB — URINALYSIS, COMPLETE (UACMP) WITH MICROSCOPIC
Bilirubin Urine: NEGATIVE
Glucose, UA: >=500 mg/dL — AB
Hgb urine dipstick: NEGATIVE
Ketones, ur: NEGATIVE mg/dL
Leukocytes,Ua: NEGATIVE
Nitrite: NEGATIVE
Protein, ur: 100 mg/dL — AB
Specific Gravity, Urine: 1.015 (ref 1.005–1.030)
pH: 5 (ref 5.0–8.0)

## 2020-01-19 LAB — LIPID PANEL
Cholesterol: 204 mg/dL — ABNORMAL HIGH (ref 0–200)
HDL: 42 mg/dL (ref >40)
LDL Cholesterol: 130 mg/dL — ABNORMAL HIGH (ref 0–99)
Total CHOL/HDL Ratio: 4.9 RATIO
Triglycerides: 159 mg/dL — ABNORMAL HIGH (ref <150)
VLDL: 32 mg/dL (ref 0–40)

## 2020-01-19 LAB — HEMOGLOBIN A1C
Hgb A1c MFr Bld: 16.4 % — ABNORMAL HIGH (ref 4.8–5.6)
Mean Plasma Glucose: 423.98 mg/dL

## 2020-01-19 LAB — URINE DRUG SCREEN, QUALITATIVE (ARMC ONLY)
Amphetamines, Ur Screen: NOT DETECTED
Barbiturates, Ur Screen: NOT DETECTED
Benzodiazepine, Ur Scrn: NOT DETECTED
Cannabinoid 50 Ng, Ur ~~LOC~~: NOT DETECTED
Cocaine Metabolite,Ur ~~LOC~~: NOT DETECTED
MDMA (Ecstasy)Ur Screen: NOT DETECTED
Methadone Scn, Ur: NOT DETECTED
Opiate, Ur Screen: NOT DETECTED
Phencyclidine (PCP) Ur S: NOT DETECTED
Tricyclic, Ur Screen: NOT DETECTED

## 2020-01-19 LAB — PREGNANCY, URINE: Preg Test, Ur: NEGATIVE

## 2020-01-19 LAB — RESPIRATORY PANEL BY RT PCR (FLU A&B, COVID)
Influenza A by PCR: NEGATIVE
Influenza B by PCR: NEGATIVE
SARS Coronavirus 2 by RT PCR: NEGATIVE

## 2020-01-19 LAB — MAGNESIUM: Magnesium: 2.2 mg/dL (ref 1.7–2.4)

## 2020-01-19 LAB — TROPONIN I (HIGH SENSITIVITY): Troponin I (High Sensitivity): 46 ng/L — ABNORMAL HIGH (ref <18)

## 2020-01-19 LAB — BETA-HYDROXYBUTYRIC ACID: Beta-Hydroxybutyric Acid: 0.11 mmol/L (ref 0.05–0.27)

## 2020-01-19 LAB — MRSA PCR SCREENING: MRSA by PCR: NEGATIVE

## 2020-01-19 LAB — OSMOLALITY: Osmolality: 326 mOsm/kg (ref 275–295)

## 2020-01-19 MED ORDER — ONDANSETRON HCL 4 MG/2ML IJ SOLN: 4.0000 mg | Freq: Four times a day (QID) | INTRAMUSCULAR | Status: DC | PRN

## 2020-01-19 MED ORDER — HEPARIN SODIUM (PORCINE) 5000 UNIT/ML IJ SOLN
5000.0000 [IU] | Freq: Three times a day (TID) | INTRAMUSCULAR | Status: DC
  Administered 2020-01-19 – 2020-01-23 (×12): 5000 [IU] via SUBCUTANEOUS
  Filled 2020-01-19 (×12): qty 1

## 2020-01-19 MED ORDER — INSULIN ASPART 100 UNIT/ML ~~LOC~~ SOLN
1.0000 [IU] | SUBCUTANEOUS | Status: DC
  Administered 2020-01-20: 3 [IU] via SUBCUTANEOUS
  Administered 2020-01-20: 1 [IU] via SUBCUTANEOUS
  Administered 2020-01-20 (×2): 3 [IU] via SUBCUTANEOUS
  Administered 2020-01-20: 2 [IU] via SUBCUTANEOUS

## 2020-01-19 MED ORDER — ISOSORBIDE MONONITRATE ER 60 MG PO TB24
90.0000 mg | ORAL_TABLET | Freq: Two times a day (BID) | ORAL | Status: DC
  Administered 2020-01-19 – 2020-01-23 (×9): 90 mg via ORAL
  Filled 2020-01-19 (×3): qty 3
  Filled 2020-01-19 (×3): qty 1
  Filled 2020-01-19: qty 3
  Filled 2020-01-19: qty 1
  Filled 2020-01-19: qty 3

## 2020-01-19 MED ORDER — POTASSIUM CHLORIDE CRYS ER 20 MEQ PO TBCR
40.0000 meq | EXTENDED_RELEASE_TABLET | Freq: Once | ORAL | Status: AC
  Administered 2020-01-19: 40 meq via ORAL
  Filled 2020-01-19: qty 2

## 2020-01-19 MED ORDER — DEXTROSE 50 % IV SOLN: 0.0000 mL | INTRAVENOUS | Status: DC | PRN

## 2020-01-19 MED ORDER — NICARDIPINE HCL IN NACL 20-0.86 MG/200ML-% IV SOLN
3.0000 mg/h | INTRAVENOUS | Status: DC
  Administered 2020-01-19: 3 mg/h via INTRAVENOUS
  Administered 2020-01-19: 6 mg/h via INTRAVENOUS
  Filled 2020-01-19 (×2): qty 200

## 2020-01-19 MED ORDER — LEVETIRACETAM 500 MG PO TABS
500.0000 mg | ORAL_TABLET | Freq: Two times a day (BID) | ORAL | Status: DC
  Administered 2020-01-19 – 2020-01-23 (×9): 500 mg via ORAL
  Filled 2020-01-19 (×9): qty 1

## 2020-01-19 MED ORDER — POTASSIUM CHLORIDE 10 MEQ/100ML IV SOLN
10.0000 meq | INTRAVENOUS | Status: AC
  Administered 2020-01-19 (×3): 10 meq via INTRAVENOUS
  Filled 2020-01-19 (×3): qty 100

## 2020-01-19 MED ORDER — CLONIDINE HCL 0.2 MG PO TABS
0.2000 mg | ORAL_TABLET | Freq: Three times a day (TID) | ORAL | Status: DC
  Administered 2020-01-19 – 2020-01-23 (×13): 0.2 mg via ORAL
  Filled 2020-01-19 (×13): qty 1

## 2020-01-19 MED ORDER — INSULIN REGULAR(HUMAN) IN NACL 100-0.9 UT/100ML-% IV SOLN
INTRAVENOUS | Status: DC
  Administered 2020-01-19: 12 [IU]/h via INTRAVENOUS
  Filled 2020-01-19: qty 100

## 2020-01-19 MED ORDER — SODIUM CHLORIDE 0.9 % IV SOLN: INTRAVENOUS | Status: DC

## 2020-01-19 MED ORDER — LORAZEPAM 2 MG/ML IJ SOLN
2.0000 mg | Freq: Once | INTRAMUSCULAR | Status: AC
  Administered 2020-01-19: 2 mg via INTRAVENOUS
  Filled 2020-01-19: qty 1

## 2020-01-19 MED ORDER — HYDRALAZINE HCL 50 MG PO TABS
100.0000 mg | ORAL_TABLET | Freq: Three times a day (TID) | ORAL | Status: DC
  Administered 2020-01-19 – 2020-01-23 (×13): 100 mg via ORAL
  Filled 2020-01-19 (×13): qty 2

## 2020-01-19 MED ORDER — POTASSIUM CHLORIDE 10 MEQ/100ML IV SOLN
10.0000 meq | INTRAVENOUS | Status: DC
  Administered 2020-01-19 (×2): 10 meq via INTRAVENOUS
  Filled 2020-01-19 (×4): qty 100

## 2020-01-19 MED ORDER — INSULIN DETEMIR 100 UNIT/ML ~~LOC~~ SOLN
20.0000 [IU] | Freq: Two times a day (BID) | SUBCUTANEOUS | Status: DC
  Filled 2020-01-19: qty 0.2

## 2020-01-19 MED ORDER — ACETAMINOPHEN 325 MG PO TABS
650.0000 mg | ORAL_TABLET | ORAL | Status: DC | PRN
  Administered 2020-01-23: 650 mg via ORAL
  Filled 2020-01-19: qty 2

## 2020-01-19 MED ORDER — AMLODIPINE BESYLATE 10 MG PO TABS
10.0000 mg | ORAL_TABLET | Freq: Every day | ORAL | Status: DC
  Administered 2020-01-19 – 2020-01-23 (×5): 10 mg via ORAL
  Filled 2020-01-19 (×5): qty 1

## 2020-01-19 MED ORDER — DEXTROSE-NACL 5-0.45 % IV SOLN: INTRAVENOUS | Status: DC

## 2020-01-19 MED ORDER — CHLORHEXIDINE GLUCONATE CLOTH 2 % EX PADS
6.0000 | MEDICATED_PAD | Freq: Every day | CUTANEOUS | Status: DC
  Administered 2020-01-19 – 2020-01-21 (×3): 6 via TOPICAL

## 2020-01-19 MED ORDER — INSULIN DETEMIR 100 UNIT/ML ~~LOC~~ SOLN
20.0000 [IU] | Freq: Two times a day (BID) | SUBCUTANEOUS | Status: DC
  Administered 2020-01-19 – 2020-01-20 (×3): 20 [IU] via SUBCUTANEOUS
  Filled 2020-01-19 (×5): qty 0.2

## 2020-01-19 MED ORDER — CARVEDILOL 25 MG PO TABS
25.0000 mg | ORAL_TABLET | Freq: Two times a day (BID) | ORAL | Status: DC
  Administered 2020-01-19 – 2020-01-23 (×8): 25 mg via ORAL
  Filled 2020-01-19 (×2): qty 2
  Filled 2020-01-19: qty 1
  Filled 2020-01-19: qty 2
  Filled 2020-01-19: qty 1
  Filled 2020-01-19 (×2): qty 2
  Filled 2020-01-19: qty 1

## 2020-01-19 MED ORDER — NICARDIPINE HCL IN NACL 20-0.86 MG/200ML-% IV SOLN
3.0000 mg/h | INTRAVENOUS | Status: DC
  Administered 2020-01-19 (×3): 5 mg/h via INTRAVENOUS
  Filled 2020-01-19 (×3): qty 200

## 2020-01-19 MED ORDER — POTASSIUM CHLORIDE 10 MEQ/100ML IV SOLN
10.0000 meq | INTRAVENOUS | Status: AC
  Administered 2020-01-19: 10 meq via INTRAVENOUS

## 2020-01-19 NOTE — ED Notes (Signed)
Patient unable to sign for transfer. Verbal consent given by patient.

## 2020-01-19 NOTE — ED Notes (Signed)
Per endo Tool continue to run Insulin drip at 11.5 units

## 2020-01-19 NOTE — ED Provider Notes (Signed)
6:58 AM Assumed care for off going team.   Blood pressure 137/63, pulse 73, temperature 98.6 F (37 C), temperature source Oral, resp. rate (!) 26, height 5\' 5"  (1.651 m), weight 112 kg, SpO2 94 %.  See their HPI for full report but in brief Transfer [facility] for continuous EEG-Seizure glucose 1200, no prior diabetes, and then seized again, 4 ativan, keppra. HypoK as well.   8:00 AM  Reevaluated patient.  Patient is alert able to state her name and is following commands.  Patient planned to go to [facility] due to getting an EEG continuous.  Patient currently on nicardipine, insulin and potassium infusions.  Per neurology note that they recommended EEG if patient continues to seize or does not have examination to follow. Although mental status is improving and pt may not need cEEG I Discussed with charge nurse and NO stepdown or ICU beds here either so will continue to wait for [place] ICU bed since pt is on insulin drip.   [facility] is on way to get patient- updated EMTALA.                      Vanessa [place], MD 01/19/20 0800

## 2020-01-19 NOTE — Progress Notes (Signed)
EEG completed, results pending. 

## 2020-01-19 NOTE — ED Notes (Signed)
Per endo tool continue to run insulin drip at 11.5. Awaiting on BMP results to see glucose results due to CBG reading high.

## 2020-01-19 NOTE — ED Notes (Signed)
Per endo Tool  Continue at 11.5

## 2020-01-19 NOTE — Progress Notes (Signed)
CRITICAL VALUE ALERT  Critical Value:  Potassium 2.7  Date & Time Notied:  01/18/2019 @ 1230  Provider Notified: CCM NP  Orders Received/Actions taken: Verbal orders received, See [facility]

## 2020-01-19 NOTE — ED Notes (Signed)
Patient is arousal to name when spoken to.

## 2020-01-19 NOTE — Procedures (Signed)
Patient Name: Satira Easlick  MRN: [id number]  Epilepsy Attending: Lora Havens  Referring Physician/Provider: Dr. Marcelle Overlie Date: 1/90/2021 Duration: 23.32 minutes  Patient history: Fourth-year-old female with history of hypertension and CKD who presented with status epilepticus, hypertensive urgency and acute respiratory EEG 12 for seizures.  Level of alertness: Awake, asleep  AEDs during EEG study: Keppra  Technical aspects: This EEG study was done with scalp electrodes positioned according to the 10-20 International system of electrode placement. Electrical activity was acquired at a sampling rate of 500Hz  and reviewed with a high frequency filter of 70Hz  and a low frequency filter of 1Hz . EEG data were recorded continuously and digitally stored.   Description: During awake state, no clear posterior dominant was seen.  Sleep was characterized by sleep spindles (12 to 14 Hz), maximal frontocentral.  EEG showed polymorphic generalized 8 to 9 Hz alpha activity.  Hyperventilation photic summation were not performed.  IMPRESSION: This study is within normal limits. No seizures or epileptiform discharges were seen throughout the recording.  Keshana Klemz Barbra Sarks

## 2020-01-19 NOTE — ED Notes (Signed)
Patient changed and purewick placed.

## 2020-01-19 NOTE — ED Notes (Signed)
Spoke with patient's daughter and updated on patient's transfer. Number for nurses station given.

## 2020-01-19 NOTE — ED Notes (Signed)
Per endo tool continue Insulin at 11.5

## 2020-01-19 NOTE — ED Notes (Signed)
Attempted to call patient's contact to notify of transfer. No answer at number and no name or number on voicemail.

## 2020-01-19 NOTE — H&P (Addendum)
NAME:  Renee [name], MRN:  [id number], DOB:  11-May-1976, LOS: 0 ADMISSION DATE:  01/19/2020, CONSULTATION DATE:  01/19/20 REFERRING MD:  [facility], CHIEF COMPLAINT:  Status Epilepticus, Hyperglycemia   Brief History   44 y/o F admitted 1/19 with   History of present illness   44 y/o F who presented to [facility] on 1/19 with reports of acute onset slurred speech and altered mental status.    The patient was reportedly getting her nails done when she became altered with slurred speech.  She reportedly had left facial twitching, left gaze deviation and fencer posturing.  CT of the head under code stroke protocol was negative for acute process. On presentation she was noted to be hypertensive (initial BP 202/96), hyponatremic (Na 117) with AKI (sr Cr 4.50) & profound hyperglycemia (CBG 1200). Troponin negative. EKG SR with LVH, IVCD and prolonged PR.  The patient was treated with ativan and keppra.  She was started on an insulin gtt & cardene gtt.   She was transferred to [facility] 1/19 for further evaluation.   Past Medical History  Morbid Obesity - BMI 41.09 HTN HFpEF CKD IV   Significant Hospital Events   1/18 Presented to [facility] as seizure, hypertensive, altered with glucose >1200, Na 117 1/19 Tx to [facility] for Neurology evaluation   Consults:  PCCM Neurology   Procedures:    Significant Diagnostic Tests:  CT Head 1/18 >> negative   Micro Data:  COVID PCR 1/18 >> negative Influenza A/B 1/18 >> negative   Antimicrobials:     Interim history/subjective:  As above   Objective   There were no vitals taken for this visit.       No intake or output data in the 24 hours ending 01/19/20 1004 There were no vitals filed for this visit.  Examination: General: adult female lying in bed in NAD HEENT: MM pink/moist, no jvd, mild upper lid edema, anicteric Neuro: drowsy but awakens to voice, speech clear, MAE, follows commands / appropriate  CV: s1s2 RRR, SR on monitor, no m/r/g PULM:   Non-labored, lungs bilaterally clear  GI: soft, bsx4 active  Extremities: warm/dry, trace to 1+ BLE pitting edema, changes consistent with chronic venous stasis  Skin: no rashes or lesions  Resolved Hospital Problem list      Assessment & Plan:   Status Epilepticus  Acute Metabolic Encephalopathy  CT head negative. UDS negative. Rule out PRES. -seizure precautions  -appreciate Neurology evaluation  -assess MRI to rule out PRES -defer AED's to neurology service   Hypertensive Emergency  HFpEF Troponin negative, EKG with LVH. UDS negative.   -resume home norvasc, coreg, clonidine, hydralazine, imdur -hold home diuretics -wean cardene gtt to off  -SBP goal 140-160 for 24 hours then can tighten control  -follow BNP  -cautious fluid resuscitation  Hyperglycemic Hyperosmolar Hyperglycemic Syndrome New Diagnosis of Diabetes Mellitus  -Insulin gtt per Endo Tool for glycemic control -assess A1c, beta-hydroxybutyric acid  -assess lipid panel   AKI superimposed on CKD IV Acute Metabolic Acidosis  Baseline sr cr ~ 3.5, has been in the 5 range since 08/2019 -Trend BMP / urinary output -Replace electrolytes as indicated -Avoid nephrotoxic agents, ensure adequate renal perfusion  Hyponatremia  Suspect in setting of hyperglycemia, home diuretics  -cautious Na correction  -follow neuro exam closely   Hypokalemia  -KCL 20 mEq IV now -follow up BMP for further replacement  Best practice:  Diet: NPO x ice, sips with meds Pain/Anxiety/Delirium protocol (if indicated): n/a VAP protocol (  if indicated): n/a  DVT prophylaxis: heparin  GI prophylaxis: n/a Glucose control: Insulin gtt, Endo Tool  Mobility: BR Code Status: Full Code  Family Communication: Daughter (Renee [name]) updated via phone Disposition: ICU, may be able to transfer to progressive this afternoon pending lab trends, mental status.   Labs   CBC: Recent Labs  Lab 01/18/20 2151  WBC 7.0  NEUTROABS 5.3  HGB 12.0    HCT 36.0  MCV 75.8*  PLT [id number]    Basic Metabolic Panel: Recent Labs  Lab 01/18/20 2151 01/19/20 0118 01/19/20 0611 01/19/20 0801  NA 117* 121* 127* 128*  K 3.0* 3.7 2.8* 3.0*  CL 82* 88* 96* 96*  CO2 20* 21* 21* 19*  GLUCOSE 1,200* 1,035* 772* 590*  BUN 53* 53* 54* 54*  CREATININE 4.61* 4.50* 4.46* 4.44*  CALCIUM 8.4* 8.7* 8.5* 8.8*  MG  --   --   --  2.2   GFR: Estimated Creatinine Clearance: 20.2 mL/min (A) (by C-G formula based on SCr of 4.44 mg/dL (H)). Recent Labs  Lab 01/18/20 2151  WBC 7.0    Liver Function Tests: Recent Labs  Lab 01/18/20 2151  AST 16  ALT 17  ALKPHOS 182*  BILITOT 0.8  PROT 7.6  ALBUMIN 3.1*   No results for input(s): LIPASE, AMYLASE in the last 168 hours. No results for input(s): AMMONIA in the last 168 hours.  ABG    Component Value Date/Time   HCO3 22.0 01/18/2020 2151   ACIDBASEDEF 5.3 (H) 01/18/2020 2151   O2SAT 91.5 01/18/2020 2151     Coagulation Profile: Recent Labs  Lab 01/18/20 2151  INR 0.9    Cardiac Enzymes: No results for input(s): CKTOTAL, CKMB, CKMBINDEX, TROPONINI in the last 168 hours.  HbA1C: Hgb A1c MFr Bld  Date/Time Value Ref Range Status  08/26/2019 11:52 AM 7.5 (H) 4.8 - 5.6 % Final    Comment:    (NOTE) Pre diabetes:          5.7%-6.4% Diabetes:              >6.4% Glycemic control for   <7.0% adults with diabetes   01/22/2018 03:02 PM 8.1 (H) 4.8 - 5.6 % Final    Comment:    (NOTE) Pre diabetes:          5.7%-6.4% Diabetes:              >6.4% Glycemic control for   <7.0% adults with diabetes     CBG: Recent Labs  Lab 01/19/20 0529 01/19/20 0604 01/19/20 0639 01/19/20 0711 01/19/20 0756  GLUCAP >600* >600* >600* >600* 528*    Review of Systems: Positives in [place]   Gen: Denies fever, chills, weight change, fatigue, night sweats HEENT: Denies blurred vision, double vision, hearing loss, tinnitus, sinus congestion, rhinorrhea, sore throat, neck stiffness, dysphagia PULM:  Denies shortness of breath, cough, sputum production, hemoptysis, wheezing CV: Denies chest pain, edema, orthopnea, paroxysmal nocturnal dyspnea, palpitations GI: Denies abdominal pain, nausea, vomiting, diarrhea, hematochezia, melena, constipation, change in bowel habits GU: Denies dysuria, hematuria, polyuria, oliguria, urethral discharge Endocrine: Denies hot or cold intolerance, polyuria, polyphagia or appetite change Derm: Denies rash, dry skin, scaling or peeling skin change Heme: Denies easy bruising, bleeding, bleeding gums Neuro: Denies headache, numbness, weakness, slurred speech, loss of memory or consciousness   Past Medical History  She,  has a past medical history of (HFpEF) heart failure with preserved ejection fraction ([place]), CKD (chronic kidney disease), stage IV ([place]), Hypertension, and Morbid  obesity ([place]).   Surgical History    Past Surgical History:  Procedure Laterality Date  . CESAREAN SECTION    . TUBAL LIGATION       Social History   reports that she has never smoked. She has never used smokeless tobacco. She reports that she does not drink alcohol or use drugs.   Family History   Her family history includes Epilepsy in her mother; Hypertension in her father and mother.   Allergies No Known Allergies   Home Medications  Prior to Admission medications   Medication Sig Start Date End Date Taking? Authorizing Provider  amLODipine (NORVASC) 10 MG tablet Take 1 tablet (10 mg total) by mouth daily. 08/31/19 09/30/19  Otila Back, MD  carvedilol (COREG) 25 MG tablet Take 1 tablet (25 mg total) by mouth 2 (two) times daily with a meal. 02/11/19   Alisa Graff, FNP  cloNIDine (CATAPRES) 0.2 MG tablet Take 1 tablet (0.2 mg total) by mouth 3 (three) times daily. 07/09/17   Fritzi Mandes, MD  fluticasone (FLONASE) 50 MCG/ACT nasal spray Place 1 spray into both nostrils daily. 01/25/18 02/11/19  Dustin Flock, MD  guaiFENesin-dextromethorphan (ROBITUSSIN DM) 100-10 MG/5ML  syrup Take 5 mLs by mouth every 4 (four) hours as needed for cough. Patient not taking: Reported on 08/26/2019 02/02/19   Luvenia Redden, PA-C  hydrALAZINE (APRESOLINE) 50 MG tablet Take 2 tablets (100 mg total) by mouth 3 (three) times daily. 08/30/19 09/29/19  Stark Jock Jude, MD  isosorbide mononitrate (IMDUR) 30 MG 24 hr tablet Take 3 tablets (90 mg total) by mouth 2 (two) times daily. 08/30/19 09/29/19  Stark Jock Jude, MD  meloxicam (MOBIC) 15 MG tablet Take 1 tablet (15 mg total) by mouth daily. 10/21/19   Cuthriell, Charline Bills, PA-C  methocarbamol (ROBAXIN) 500 MG tablet Take 1 tablet (500 mg total) by mouth 4 (four) times daily. 10/21/19   Cuthriell, Charline Bills, PA-C  torsemide (DEMADEX) 20 MG tablet Take 2 tablets (40 mg total) by mouth daily. 08/31/19 09/30/19  Stark Jock Jude, MD  triamcinolone cream (KENALOG) 0.1 % Apply 1 application topically 2 (two) times daily. 08/21/19   [provider]     Critical care time: 17 minutes     Noe Gens, MSN, NP-C [facility] 01/19/2020, 10:09 AM   Please see [email or web address] for pager details.

## 2020-01-19 NOTE — ED Notes (Addendum)
Patient able to turn self and follow commands for xray. Oxygen improved with change in position. O2 decreased to 2L [place]

## 2020-01-19 NOTE — ED Provider Notes (Signed)
-----------------------------------------   4:28 AM on 01/19/2020 -----------------------------------------  Assumed care from Dr. Archie Balboa when he signed out at about midnight.  In short, Renee [name] is a 44 y.o. female with a chief complaint of seizure.  Refer to the original H&P for additional details.  The patient has been accepted to the ICU at [facility] for her severe hyperglycemia and seizure-like activity.  [facility] has the ability to perform continuous EEG monitoring which we do not have at [facility], which is the reason for the transfer rather than admitting at this facility.  Bed assignment is still pending.  In the meantime, the patient's potassium came up to 3.7 to insulin drip was started according to Dr. Gennette Pac plan.  Potassium is still continuing.  Nicardipine drip was originally increased to 7.5 mg/h that was decreased when her systolic blood pressure dropped to about 118.  Her blood pressure is holding reasonably stable at this time with a systolic blood pressure between 120 and 140.  Fluids are continuing.  The patient is more responsive and able to follow commands.  Still protecting airway.   ----------------------------------------- 6:58 AM on 01/19/2020 -----------------------------------------  Ordered potassium 10 meq IV x 4 more hours.  Repeat BMP shows improvement but still hypokalemic and hyperglycemic.  Transferring ED care to Dr. Jari Pigg.   Hinda Kehr, MD 01/19/20 [phone]

## 2020-01-19 NOTE — ED Notes (Addendum)
Date and time results received: 01/19/20 0648   Test: Glucose Critical Value: 772  Name of Provider Notified: [place]  Orders Received? Or Actions Taken?: NA

## 2020-01-19 NOTE — Consult Note (Signed)
Neurology Consultation Reason for Consult: Status epilepticus Referring Physician: Dr. Lenice Llamas  CC: Status epilepticus  History is obtained from: Patient and chart review  HPI: Renee [name] is a 44 y.o. female with past medical history of hypertension and CKD who was transferred from [facility] for status epilepticus and encephalopathy.  On review of records, patient presented [facility] hospital on 01/18/2020.  She was noted to have behavioral arrest, left facial twitching followed by left gaze deviation intensive posturing.  Of note, on arrival her blood pressure was 202/96 and her blood glucose was 1200.  CT head noncontrast was done which did not show any acute abnormality.  Patient was loaded with IV Keppra and transferred to [facility] for further management.  Currently, patient states she feels slightly confused and is unsure what exactly happened.  She states she has been taking antihypertensives but ran out of one of her medications a few days ago.  She denies being on any diabetes medications.  She also denies any prior history of seizures.  Denies any headache at this point, does report blurry vision but states it likely because she wears reading glasses.  Denies any other concerns.  ROS: A 14 point ROS was performed and is negative except as noted in the HPI.   Past Medical History:  Diagnosis Date  . (HFpEF) heart failure with preserved ejection fraction ([place])   . CKD (chronic kidney disease), stage IV ([place])   . Hypertension   . Morbid obesity ([place])     Family History  Problem Relation Age of Onset  . Epilepsy Mother   . Hypertension Mother   . Hypertension Father    Social History:  reports that she has never smoked. She has never used smokeless tobacco. She reports that she does not drink alcohol or use drugs.   Exam: Current vital signs: BP (!) 164/75 Comment: see MAR, titrate  Pulse 78   Resp 18   SpO2 93%  Vital signs in last 24 hours: Temp:  [98.2 F  (36.8 C)-98.6 F (37 C)] 98.2 F (36.8 C) (01/19 0852) Pulse Rate:  [68-92] 78 (01/19 1015) Resp:  [12-32] 18 (01/19 1015) BP: (117-248)/(61-142) 164/75 (01/19 1015) SpO2:  [88 %-100 %] 93 % (01/19 1015) Weight:  [867 kg] 112 kg (01/18 2145)   Physical Exam  Constitutional: Appears well-developed and well-nourished.  Psych: Affect appropriate to situation Eyes: No scleral injection HENT: No OP obstrucion Head: Normocephalic.  Cardiovascular: Normal rate and regular rhythm.  Respiratory: Effort normal, non-labored breathing GI: Soft.  No distension. There is no tenderness.  Skin: WDI  Neuro: Mental Status: Patient is awake, alert, oriented to person, place, month, year, and situation Patient is able to give a clear and coherent history. No signs of aphasia or neglect Cranial Nerves: II: Visual Fields are full. Pupils are equal, round, and reactive to light.   III,IV, VI: EOMI without ptosis or diploplia.  V: Facial sensation is symmetric to temperature VII: Facial movement is symmetric.  VIII: hearing is intact to voice X: Uvula elevates symmetrically XI: Shoulder shrug is symmetric. XII: tongue is midline without atrophy or fasciculations.  Motor: Tone is normal. Bulk is normal. 4/5 strength was present in all four extremities.  Sensory: Sensation is symmetric to light touch and temperature in the arms and legs. Deep Tendon Reflexes: 1+ and symmetric in the biceps and patellae.  Plantars: Toes are downgoing bilaterally.  Cerebellar: FNF and HKS are intact bilaterally  I have reviewed labs  in epic and the results pertinent to this consultation are: Sodium 127 Blood glucose 773 BUN 54 and serum creatinine 4.46 Albumin 3.1 Alk phos 182, normal AST ALT and bilirubin  I have reviewed the images obtained: CT head without contrast 01/18/2020: No acute abnormalities.   ASSESSMENT/PLAN: 44 year old female with history of hypertension who presented with status  epilepticus on 01/18/2020 in the setting of hypertensive emergency and hyperglycemic hyperosmolar syndrome.  Status epilepticus (resolved) Hypertensive emergency HHS AKI on CKD Hyponatremia -Most likely etiology for status epilepticus is hypertensive emergency and HHS.  However currently patient is close to baseline and able to answer all questions appropriately therefore I do not suspect that she is in subclinical status epilepticus.  Recommendations -As patient initially presented with status epilepticus, will continue Keppra 500 mg twice daily for maintenance -We will order routine EEG to assess for any potential epileptogenic city -We will order MRI brain without contrast to look for PRES and any acute abnormality -Recommend slow correction (<85mol/l over 24 hours)  of sodium to avoid central pontine myelinolysis -Goal SBP 140-160, blood glucose management, management of AKI per primary team -Anticipate most likely patient will be discharged on Keppra which can be weaned off gradually over the next 3 to 6 months if patient does not have any further seizures. -We will require follow-up with neurology in 8 to 12 weeks after discharge.  CRITICAL CARE Performed by: PLora Havens  Total critical care time: 35 minutes  Critical care time was exclusive of separately billable procedures and treating other patients.  Critical care was necessary to treat or prevent imminent or life-threatening deterioration.  Critical care was time spent personally by me on the following activities: development of treatment plan with patient and/or surrogate as well as nursing, discussions with consultants, evaluation of patient's response to treatment, examination of patient, obtaining history from patient or surrogate, ordering and performing treatments and interventions, ordering and review of laboratory studies, ordering and review of radiographic studies, pulse oximetry and re-evaluation of patient's  condition.

## 2020-01-19 NOTE — ED Notes (Addendum)
Continue Insulin at 11.5 units per Endo tool for high blood sugar

## 2020-01-19 NOTE — Progress Notes (Signed)
Inpatient Diabetes Program Recommendations  AACE/ADA: New Consensus Statement on Inpatient Glycemic Control (2015)  Target Ranges:  Prepandial:   less than 140 mg/dL      Peak postprandial:   less than 180 mg/dL (1-2 hours)      Critically ill patients:  140 - 180 mg/dL  Results for MELIANA, WOODWARD (MRN [id number]) as of 01/19/2020 09:32  Ref. Range 01/19/2020 05:29 01/19/2020 06:04 01/19/2020 06:39 01/19/2020 07:11 01/19/2020 07:56  Glucose-Capillary Latest Ref Range: 70 - 99 mg/dL >600 (HH) >600 (HH) >600 (HH) >600 (HH) [facility])   Results for MALERY, PELLERITO (MRN [id number]) as of 01/19/2020 09:32  Ref. Range 01/18/2020 21:51  Glucose Latest Ref Range: 70 - 99 mg/dL 1,200 [facility])   Results for MARIANNA, MCGAHAN (MRN [id number]) as of 01/19/2020 09:32  Ref. Range 04/11/2015 00:00 06/14/2017 15:03 06/15/2017 04:02 01/22/2018 15:02 08/26/2019 11:52  Hemoglobin A1C Latest Ref Range: 4.8 - 5.6 % 7.0 (A) 7.5 (H) 7.1 (H) 8.1 (H) 7.5 (H)   Review of Glycemic Control  Diabetes history: DM2 (per office visit note by Dr. Juleen China on 11/16/2019 in [place]) Outpatient Diabetes medications: None Current orders for Inpatient glycemic control: IV insulin  Inpatient Diabetes Program Recommendations:   A1C: Please consider ordering a current A1C.  Last A1C in chart was 7.5% on 08/26/19 indicating an average glucose of 169 mg/dl.   NOTE: Per H&P, patient had prediabetes hx. However, in further reviewing the chart noted patient was inpatient 08/26/19 to 08/30/19 and was seen by Inpatient Diabetes Coordinator on 08/28/19. At that time patient reported she had never been dx with DM2 but noted that she had GDM (16 years ago) and had used insulin during pregnancy. Patient was educated about DM during that visit as well. Noted in Care Everywhere patient seen Dr. Juleen China on 11/16/19 and lab glucose was noted to be 367 mg/dl on 11/16/19 but patient still does not have any prescribed DM medications noted in chart anywhere.  Patient's lab glucose noted to be 1200 mg/dl on 01/18/20 on initial labs and IV insulin was started on 01/19/20 around 3am (delayed start due to hypokalemia).  Glucose down to 528 mg/dl and remains on IV insulin at this time. Noted patient will be transferred to [facility] ICU. Inpatient Diabetes team will continue to follow and talk with patient when appropriate.  Thanks Barnie Alderman, RN, MSN, CDE Diabetes Coordinator Inpatient Diabetes Program [phone] (Team Pager from 8am to 5pm)

## 2020-01-20 ENCOUNTER — Inpatient Hospital Stay (HOSPITAL_COMMUNITY): Payer: Medicare Other

## 2020-01-20 DIAGNOSIS — E1101 Type 2 diabetes mellitus with hyperosmolarity with coma: Secondary | ICD-10-CM

## 2020-01-20 DIAGNOSIS — N179 Acute kidney failure, unspecified: Secondary | ICD-10-CM

## 2020-01-20 LAB — BASIC METABOLIC PANEL
Anion gap: 9 (ref 5–15)
BUN: 48 mg/dL — ABNORMAL HIGH (ref 6–20)
CO2: 18 mmol/L — ABNORMAL LOW (ref 22–32)
Calcium: 8.4 mg/dL — ABNORMAL LOW (ref 8.9–10.3)
Chloride: 104 mmol/L (ref 98–111)
Creatinine, Ser: 4.49 mg/dL — ABNORMAL HIGH (ref 0.44–1.00)
GFR calc Af Amer: 13 mL/min — ABNORMAL LOW (ref >60)
GFR calc non Af Amer: 11 mL/min — ABNORMAL LOW (ref >60)
Glucose, Bld: 206 mg/dL — ABNORMAL HIGH (ref 70–99)
Potassium: 3.3 mmol/L — ABNORMAL LOW (ref 3.5–5.1)
Sodium: 131 mmol/L — ABNORMAL LOW (ref 135–145)

## 2020-01-20 LAB — MAGNESIUM: Magnesium: 1.9 mg/dL (ref 1.7–2.4)

## 2020-01-20 LAB — CBC
HCT: 31.9 % — ABNORMAL LOW (ref 36.0–46.0)
Hemoglobin: 11.1 g/dL — ABNORMAL LOW (ref 12.0–15.0)
MCH: 25.8 pg — ABNORMAL LOW (ref 26.0–34.0)
MCHC: 34.8 g/dL (ref 30.0–36.0)
MCV: 74.2 fL — ABNORMAL LOW (ref 80.0–100.0)
Platelets: 331 10*3/uL (ref 150–400)
RBC: 4.3 MIL/uL (ref 3.87–5.11)
RDW: 14.5 % (ref 11.5–15.5)
WBC: 11.6 10*3/uL — ABNORMAL HIGH (ref 4.0–10.5)
nRBC: 0 % (ref 0.0–0.2)

## 2020-01-20 LAB — GLUCOSE, CAPILLARY
Glucose-Capillary: 196 mg/dL — ABNORMAL HIGH (ref 70–99)
Glucose-Capillary: 202 mg/dL — ABNORMAL HIGH (ref 70–99)
Glucose-Capillary: 222 mg/dL — ABNORMAL HIGH (ref 70–99)
Glucose-Capillary: 235 mg/dL — ABNORMAL HIGH (ref 70–99)
Glucose-Capillary: 301 mg/dL — ABNORMAL HIGH (ref 70–99)
Glucose-Capillary: 386 mg/dL — ABNORMAL HIGH (ref 70–99)

## 2020-01-20 LAB — BRAIN NATRIURETIC PEPTIDE: B Natriuretic Peptide: 212.4 pg/mL — ABNORMAL HIGH (ref 0.0–100.0)

## 2020-01-20 LAB — PHOSPHORUS: Phosphorus: 3.5 mg/dL (ref 2.5–4.6)

## 2020-01-20 MED ORDER — PRAVASTATIN SODIUM 10 MG PO TABS
20.0000 mg | ORAL_TABLET | Freq: Every day | ORAL | Status: DC
  Administered 2020-01-20 – 2020-01-22 (×3): 20 mg via ORAL
  Filled 2020-01-20 (×3): qty 2

## 2020-01-20 MED ORDER — INSULIN ASPART 100 UNIT/ML ~~LOC~~ SOLN
3.0000 [IU] | SUBCUTANEOUS | Status: DC
  Administered 2020-01-20: 9 [IU] via SUBCUTANEOUS

## 2020-01-20 MED ORDER — ASPIRIN 81 MG PO CHEW
81.0000 mg | CHEWABLE_TABLET | Freq: Every day | ORAL | Status: DC
  Administered 2020-01-20 – 2020-01-23 (×4): 81 mg via ORAL
  Filled 2020-01-20 (×4): qty 1

## 2020-01-20 MED ORDER — POTASSIUM CHLORIDE CRYS ER 20 MEQ PO TBCR
40.0000 meq | EXTENDED_RELEASE_TABLET | Freq: Once | ORAL | Status: AC
  Administered 2020-01-20: 40 meq via ORAL
  Filled 2020-01-20: qty 2

## 2020-01-20 MED ORDER — SODIUM CHLORIDE 0.9 % IV SOLN: INTRAVENOUS | Status: DC

## 2020-01-20 NOTE — Progress Notes (Signed)
Subjective: NAEO.seizures. Denies any headache. States has blurry vision ad saw eye doctor in past, requires prescription glasses.   ROS: negative except above  Examination  Vital signs in last 24 hours: Temp:  [97.6 F (36.4 C)-99.1 F (37.3 C)] 99.1 F (37.3 C) (01/20 0800) Pulse Rate:  [64-77] 68 (01/20 1000) Resp:  [21-32] 26 (01/20 1000) BP: (121-172)/(59-143) 143/78 (01/20 1000) SpO2:  [89 %-98 %] 95 % (01/20 1000)  General: lying in bed, NAD CVS: pulse-normal rate and rhythm RS: breathing comfortably Extremities: normal   Neuro: MS: Alert, oriented, follows commands CN: pupils equal and reactive,  EOMI, face symmetric, tongue midline, normal sensation over face, Motor: 5/5 strength in all 4 extremities Reflexes: 2+ bilaterally over patella, biceps, plantars: flexor Coordination: normal Gait: not tested  Basic Metabolic Panel: Recent Labs  Lab 01/19/20 0801 01/19/20 0801 01/19/20 1056 01/19/20 1056 01/19/20 1520 01/19/20 1520 01/19/20 1849 01/19/20 2234 01/20/20 0255  NA 128*   < > 132*  --  131*  --  134* 135 131*  K 3.0*   < > 2.7*  --  3.0*  --  3.4* 3.1* 3.3*  CL 96*   < > 102  --  101  --  107 108 104  CO2 19*   < > 19*  --  20*  --  18* 18* 18*  GLUCOSE 590*   < > 288*  --  125*  --  139* 105* 206*  BUN 54*   < > 51*  --  51*  --  49* 49* 48*  CREATININE 4.44*   < > 4.38*  --  4.28*  --  4.18* 4.35* 4.49*  CALCIUM 8.8*   < > 9.0   < > 8.7*   < > 8.9 8.7* 8.4*  MG 2.2  --   --   --   --   --   --   --  1.9  PHOS  --   --   --   --   --   --   --   --  3.5   < > = values in this interval not displayed.    CBC: Recent Labs  Lab 01/18/20 2151 01/20/20 0255  WBC 7.0 11.6*  NEUTROABS 5.3  --   HGB 12.0 11.1*  HCT 36.0 31.9*  MCV 75.8* 74.2*  PLT 270 331     Coagulation Studies: Recent Labs    01/18/20 2151  LABPROT 12.4  INR 0.9    Imaging MRI Brain wo contrast: Negative MRI head.  No acute infarct.  Negative for PRES  ASSESSMENT  AND PLAN:  44 year old female with history of hypertension who presented with status epilepticus on 01/18/2020 in the setting of hypertensive emergency and hyperglycemic hyperosmolar syndrome.  Status epilepticus (resolved) Hypertensive emergency HHS AKI on CKD Hyponatremia -Most likely etiology for status epilepticus is hypertensive emergency and HHS. However currently patient is close to baseline and able to answer all questions appropriately therefore I do not suspect that she is in subclinical status epilepticus.  Recommendations -Continue Keppra 500 mg twice daily for maintenance -Goal SBP 140-160, blood glucose management, management of AKI per primary team -Normal EEG and MRI brain, therefore anticipate patient can be weaned off in nezt 3-4 months if patient does not have any further seizures. - Follow-up with neurology in 8 to 12 weeks after discharge. - Seizure precautions, including DO NOT DRIVE   Thank you for allowing [place] to participate in the care of this  patient. Neurology will sign off. Please page neurohospitalist for any further question.    I have spent a total of  35 minutes with the patient reviewing hospital notes,  test results, labs and examining the patient as well as establishing an assessment and plan that was discussed personally with the patient.  > 50% of time was spent in direct patient care.  Jenniah Bhavsar Barbra Sarks

## 2020-01-20 NOTE — Progress Notes (Signed)
eLink Physician-Brief Progress Note Patient Name: Renee [name] DOB: 1976/05/12 MRN: [id number]   Date of Service  01/20/2020  HPI/Events of Note  Blood sugar 386 mg , per Endocrinology recommendations in the chart Pt is to be covered with Humalog according to resistant scale, Pt currently covered according to sensitive scale.  eICU Interventions  Will change coverage to resistant scale per endocrinology recommendations, if hyperglycemia persists will resume insulin infusion.        Kerry Kass Aluel Schwarz 01/20/2020, 8:10 PM

## 2020-01-20 NOTE — Progress Notes (Signed)
Inpatient Diabetes Program Recommendations  AACE/ADA: New Consensus Statement on Inpatient Glycemic Control (2015)  Target Ranges:  Prepandial:   less than 140 mg/dL      Peak postprandial:   less than 180 mg/dL (1-2 hours)      Critically ill patients:  140 - 180 mg/dL   Lab Results  Component Value Date   GLUCAP 222 (H) 01/20/2020   HGBA1C 16.4 (H) 01/19/2020    Review of Glycemic Control  Results for CHASITIE, RITCHIE (MRN [id number]) as of 01/20/2020 11:25  Ref. Range 01/19/2020 22:18 01/19/2020 23:38 01/20/2020 03:47 01/20/2020 08:32  Glucose-Capillary Latest Ref Range: 70 - 99 mg/dL 108 (H) 134 (H) 235 (H) 222 (H)    Diabetes history: DM2 (per office visit note by Dr. Juleen China on 11/16/2019 in [place]) Outpatient Diabetes medications: None Current orders for Inpatient glycemic control: Novolog 1-3 units Q4H (ICU Protocol) + Levemir 20 units BID  Inpatient Diabetes Program Recommendations:     -Please consider Novolog 0-9 units Q4H   Thank you, Reche Dixon, RN, BSN Diabetes Coordinator Inpatient Diabetes Program [phone] (team pager from 8a-5p)

## 2020-01-20 NOTE — Progress Notes (Signed)
NAME:  Renee [name], MRN:  [id number], DOB:  [birth year], LOS: 1 ADMISSION DATE:  01/19/2020, CONSULTATION DATE:  01/19/20 REFERRING MD:  [facility], CHIEF COMPLAINT:  Status Epilepticus, Hyperglycemia   Brief History   44 y/o F admitted 1/19 with   History of present illness   44 y/o F who presented to [facility] on 1/19 with reports of acute onset slurred speech and altered mental status.    The patient was reportedly getting her nails done when she became altered with slurred speech.  She reportedly had left facial twitching, left gaze deviation and fencer posturing.  CT of the head under code stroke protocol was negative for acute process. On presentation she was noted to be hypertensive (initial BP 202/96), hyponatremic (Na 117) with AKI (sr Cr 4.50) & profound hyperglycemia (CBG 1200). Troponin negative. EKG SR with LVH, IVCD and prolonged PR.  The patient was treated with ativan and keppra.  She was started on an insulin gtt & cardene gtt.   She was transferred to [facility] 1/19 for further evaluation.   Past Medical History  Morbid Obesity - BMI 41.09 HTN HFpEF CKD IV   Significant Hospital Events   1/18 Presented to [facility] as seizure, hypertensive, altered with glucose >1200, Na 117 1/19 Tx to [facility] for Neurology evaluation   Consults:  PCCM Neurology   Procedures:    Significant Diagnostic Tests:  CT Head 1/18 >> negative  MRI brain 1/19 > neg.  Micro Data:  COVID PCR 1/18 >> negative Influenza A/B 1/18 >> negative   Antimicrobials:     Interim history/subjective:  No acute events. Awake but remains encephalopathic this AM.  Objective   Blood pressure (!) 149/78, pulse 68, temperature 98.3 F (36.8 C), temperature source Axillary, resp. rate (!) 27, SpO2 95 %.        Intake/Output Summary (Last 24 hours) at 01/20/2020 0758 Last data filed at 01/20/2020 0700 Gross per 24 hour  Intake 2038.23 ml  Output 700 ml  Net 1338.23 ml   There were no vitals filed for this  visit.  Examination: General: Adult female, resting in bed, in NAD. Neuro: Awake but encephalopathic. HEENT: [place]/AT. Sclerae anicteric. EOMI. Cardiovascular: RRR, no M/R/G.  Lungs: Respirations even and unlabored.  CTA bilaterally, No W/R/R. Abdomen: BS x 4, soft, NT/ND.  Musculoskeletal: No gross deformities, 1+ edema.  Skin: Intact, warm, no rashes.  Assessment & Plan:   Status Epilepticus - resolved. Acute Metabolic Encephalopathy - CT head, UDS, MRI, EEG all negative.  Suspect 2/2 HHS. - Seizure precautions . - Appreciate Neurology evaluation. - Continue empiric keppra per neuro with plans for gradual taper until follow up as outpatient.  Hypertensive Emergency - resolved. Hx LVH (echo from Aug 2020 with EF 55-60%, mod dilated LA, severely increased LV wall thickness). - Continue home norvasc, coreg, clonidine, hydralazine, imdur. - Hold home diuretics.  New diagnosis HLD - lipid panel with total chol 204, HDL 42, LDL 130, Trigs 159. - Start pravastatin at 20mg  daily and ASA 81mg  daily. - Diet modification, exercise counseling, PCP follow up as outpatient.  Hyperglycemic Hyperosmolar Hyperglycemic Syndrome. New Diagnosis of Diabetes Mellitus - A1C of 16.4 noted on admission. - Continue D51/2NS with SSI and levemir until tolerating PO. - DM educator consult placed.  AKI superimposed on CKD IV (baseline SCr ~ 3.5). Mild NAGMA. Hypokalemia - being repleted. Pseudohyponatremia - 2/2 hyperglycemia. - Continue supportive care.   Best practice:  Diet: NPO until mental status improves. Continue D51/2NS in the  meantime. Pain/Anxiety/Delirium protocol (if indicated): n/a VAP protocol (if indicated): n/a  DVT prophylaxis: heparin  GI prophylaxis: n/a Glucose control: SSI, levemir Mobility: BR Code Status: Full Code  Family Communication: Daughter (DeeDee) updated via phone on admission. Disposition: Transfer to SDU, will ask TRH to assume care in AM 1/21 with PCCM  off.   Montey [name], [facility] 01/20/2020, 8:17 AM

## 2020-01-20 NOTE — Progress Notes (Signed)
[place] Progress Note Patient Name: Jaleese Steckler DOB: 1976/11/12 MRN: [id number]   Date of Service  01/20/2020  HPI/Events of Note  K+ 3.1, Creatinine 4.3  eICU Interventions  KCL 40 meq po x 1        Matt Delpizzo U Martine Trageser 01/20/2020, 1:26 AM

## 2020-01-21 ENCOUNTER — Encounter (HOSPITAL_COMMUNITY): Payer: Self-pay | Admitting: Internal Medicine

## 2020-01-21 ENCOUNTER — Other Ambulatory Visit: Payer: Self-pay

## 2020-01-21 LAB — GLUCOSE, CAPILLARY
Glucose-Capillary: 282 mg/dL — ABNORMAL HIGH (ref 70–99)
Glucose-Capillary: 241 mg/dL — ABNORMAL HIGH (ref 70–99)
Glucose-Capillary: 148 mg/dL — ABNORMAL HIGH (ref 70–99)
Glucose-Capillary: 151 mg/dL — ABNORMAL HIGH (ref 70–99)
Glucose-Capillary: 152 mg/dL — ABNORMAL HIGH (ref 70–99)
Glucose-Capillary: 113 mg/dL — ABNORMAL HIGH (ref 70–99)
Glucose-Capillary: 226 mg/dL — ABNORMAL HIGH (ref 70–99)
Glucose-Capillary: 227 mg/dL — ABNORMAL HIGH (ref 70–99)
Glucose-Capillary: 212 mg/dL — ABNORMAL HIGH (ref 70–99)
Glucose-Capillary: 214 mg/dL — ABNORMAL HIGH (ref 70–99)

## 2020-01-21 LAB — BASIC METABOLIC PANEL
Anion gap: 12 (ref 5–15)
BUN: 52 mg/dL — ABNORMAL HIGH (ref 6–20)
CO2: 16 mmol/L — ABNORMAL LOW (ref 22–32)
Calcium: 8.5 mg/dL — ABNORMAL LOW (ref 8.9–10.3)
Chloride: 107 mmol/L (ref 98–111)
Creatinine, Ser: 4.77 mg/dL — ABNORMAL HIGH (ref 0.44–1.00)
GFR calc Af Amer: 12 mL/min — ABNORMAL LOW (ref >60)
GFR calc non Af Amer: 10 mL/min — ABNORMAL LOW (ref >60)
Glucose, Bld: 245 mg/dL — ABNORMAL HIGH (ref 70–99)
Potassium: 3.1 mmol/L — ABNORMAL LOW (ref 3.5–5.1)
Sodium: 135 mmol/L (ref 135–145)

## 2020-01-21 LAB — CBC
HCT: 31.5 % — ABNORMAL LOW (ref 36.0–46.0)
Hemoglobin: 10.6 g/dL — ABNORMAL LOW (ref 12.0–15.0)
MCH: 25.5 pg — ABNORMAL LOW (ref 26.0–34.0)
MCHC: 33.7 g/dL (ref 30.0–36.0)
MCV: 75.7 fL — ABNORMAL LOW (ref 80.0–100.0)
Platelets: 283 10*3/uL (ref 150–400)
RBC: 4.16 MIL/uL (ref 3.87–5.11)
RDW: 14.8 % (ref 11.5–15.5)
WBC: 8.1 10*3/uL (ref 4.0–10.5)
nRBC: 0 % (ref 0.0–0.2)

## 2020-01-21 LAB — MAGNESIUM: Magnesium: 2.2 mg/dL (ref 1.7–2.4)

## 2020-01-21 LAB — PHOSPHORUS: Phosphorus: 3.9 mg/dL (ref 2.5–4.6)

## 2020-01-21 MED ORDER — INSULIN ASPART 100 UNIT/ML ~~LOC~~ SOLN
5.0000 [IU] | Freq: Three times a day (TID) | SUBCUTANEOUS | Status: DC
  Administered 2020-01-21 (×2): 5 [IU] via SUBCUTANEOUS

## 2020-01-21 MED ORDER — DEXTROSE-NACL 5-0.45 % IV SOLN: INTRAVENOUS | Status: DC

## 2020-01-21 MED ORDER — POTASSIUM CHLORIDE CRYS ER 20 MEQ PO TBCR
20.0000 meq | EXTENDED_RELEASE_TABLET | Freq: Once | ORAL | Status: AC
  Administered 2020-01-21: 20 meq via ORAL
  Filled 2020-01-21: qty 1

## 2020-01-21 MED ORDER — SODIUM CHLORIDE 0.9 % IV SOLN: INTRAVENOUS | Status: DC

## 2020-01-21 MED ORDER — DEXTROSE 50 % IV SOLN: 0.0000 mL | INTRAVENOUS | Status: DC | PRN

## 2020-01-21 MED ORDER — HYDRALAZINE HCL 20 MG/ML IJ SOLN
10.0000 mg | Freq: Four times a day (QID) | INTRAMUSCULAR | Status: DC | PRN
  Administered 2020-01-21: 10 mg via INTRAVENOUS
  Filled 2020-01-21: qty 1

## 2020-01-21 MED ORDER — INSULIN REGULAR(HUMAN) IN NACL 100-0.9 UT/100ML-% IV SOLN
INTRAVENOUS | Status: DC
  Administered 2020-01-21: 10 [IU]/h via INTRAVENOUS
  Filled 2020-01-21: qty 100

## 2020-01-21 MED ORDER — INSULIN GLARGINE 100 UNIT/ML ~~LOC~~ SOLN
20.0000 [IU] | SUBCUTANEOUS | Status: AC
  Administered 2020-01-21: 20 [IU] via SUBCUTANEOUS
  Filled 2020-01-21: qty 0.2

## 2020-01-21 MED ORDER — INSULIN DETEMIR 100 UNIT/ML ~~LOC~~ SOLN
5.0000 [IU] | Freq: Two times a day (BID) | SUBCUTANEOUS | Status: DC
  Administered 2020-01-21: 5 [IU] via SUBCUTANEOUS
  Filled 2020-01-21 (×2): qty 0.05

## 2020-01-21 MED ORDER — INSULIN GLARGINE 100 UNIT/ML ~~LOC~~ SOLN
25.0000 [IU] | Freq: Two times a day (BID) | SUBCUTANEOUS | Status: DC
  Administered 2020-01-21 – 2020-01-23 (×4): 25 [IU] via SUBCUTANEOUS
  Filled 2020-01-21 (×5): qty 0.25

## 2020-01-21 MED ORDER — INSULIN ASPART 100 UNIT/ML ~~LOC~~ SOLN: 1.0000 [IU] | SUBCUTANEOUS | Status: DC

## 2020-01-21 MED ORDER — INSULIN ASPART 100 UNIT/ML ~~LOC~~ SOLN
0.0000 [IU] | SUBCUTANEOUS | Status: DC
  Administered 2020-01-21 (×4): 3 [IU] via SUBCUTANEOUS
  Administered 2020-01-22 (×4): 2 [IU] via SUBCUTANEOUS
  Administered 2020-01-22: 3 [IU] via SUBCUTANEOUS
  Administered 2020-01-23: 2 [IU] via SUBCUTANEOUS
  Administered 2020-01-23 (×3): 1 [IU] via SUBCUTANEOUS

## 2020-01-21 NOTE — Progress Notes (Signed)
Inpatient Diabetes Program Recommendations  AACE/ADA: New Consensus Statement on Inpatient Glycemic Control (2015)  Target Ranges:  Prepandial:   less than 140 mg/dL      Peak postprandial:   less than 180 mg/dL (1-2 hours)      Critically ill patients:  140 - 180 mg/dL   Lab Results  Component Value Date   GLUCAP 113 (H) 01/21/2020   HGBA1C 16.4 (H) 01/19/2020    Review of Glycemic Control  Results for THEADA, POEPPELMAN (MRN [id number]) as of 01/21/2020 09:42  Ref. Range 01/21/2020 02:53 01/21/2020 03:54 01/21/2020 05:07 01/21/2020 06:06 01/21/2020 07:26  Glucose-Capillary Latest Ref Range: 70 - 99 mg/dL 241 (H) 148 (H) 151 (H) 152 (H) 113 (H)    Diabetes history: DM2 (per office visit note by Dr. Juleen China on 11/16/2019 in [place]) Outpatient Diabetes medications: None Current orders for Inpatient glycemic control: Levemir 5 units BID + Novolog 1-3 units Q4H  Inpatient Diabetes Program Recommendations:    Note: Patient was placed back on IV Insulin last night.  Patient was on 20 units of Levemir BID and that was not enough basal.  -Please Consider  -Novolog 0-9 units Q4H -Novolog 5 units TID with meals if eats at least 50% -Levemir 25 units BID  Thank you, Reche Dixon, RN, BSN Diabetes Coordinator Inpatient Diabetes Program [phone] (team pager from 8a-5p)

## 2020-01-21 NOTE — Progress Notes (Signed)
Report Given to [facility] RN, patient to be transferred to room 16.

## 2020-01-21 NOTE — Progress Notes (Signed)
[place] Progress Note Patient Name: Renee [name] DOB: 02-26-1976 MRN: [id number]   Date of Service  01/21/2020  HPI/Events of Note  Blood sugar 301 mg % despite sliding scale Humalog insulin coverage.  eICU Interventions  Insulin infusion ordered.        [name] 01/21/2020, 12:42 AM

## 2020-01-21 NOTE — Progress Notes (Signed)
PROGRESS NOTE    Renee [name]  [id number] DOB: Dec 11, 1976 DOA: 01/19/2020 PCP: Minda Ditto, MD   Brief Narrative: 31 yF w CHF, CKD IV (Baseline creat 4.9-5.3 in 08/2019 but 3.2-3.8, 11 months ago), tx from [facility]1/19,w/ acute onset slurred speech and altered mental status. On presentation BP 202/96, CBG 1200, sod 117, AKI creat 4.5.  Patient was admitted to ICU and treated for HHNK/new onset DM hba1c 16,seizure/AMS,New HLD-EEG/MRI no PRES, and was normal without acute finding.  Patient was started on insulin, Keppra.  1/21- resumed on insulin drip in ICU Tx to [facility] 1/21  Subjective:  Got up w pt and on bedside chaiur, insulin stopped earlier in am Overnight blood pressure fairly stable 140s to 150s, saturating on room air Labs showed potassium 3.1, creatinine 4.7, bicarb 16 Sugar was running in high 300s insulin scale was changed from sensitive to resistant with no i improvement and patient was placed started on insulin infusion overnight- stopped this am at 7.30  Assessment & Plan:   New onset type 2 diabetes mellitus/HHNK, poorly controlled HbA1c at 16.  Patient was a started on Lantus 20 u bid along with sliding scale bu overnight in ICU back on insulin gtt and stopped this am- got 5 u levemir- will dose with 20 u more. apprecaite dm coordinator inputs "-Novolog 0-9 units Q4H,Novolog 5 units TID with meals if eats at least 50%, Levemir 25 units BID". Reports she was prediabetic 1-2 yr ago. And was on insulin while she was pregnant and aware abt insulin, dm etcs  Recent Labs  Lab 01/21/20 0253 01/21/20 0354 01/21/20 0507 01/21/20 0606 01/21/20 0726  GLUCAP 241* 148* 151* 152* 113*   Hypertensive emergency: Blood pressure controlled on multiple regimen for 1 [id number] 60 systolic.  Diuretics on hold.  Acute metabolic encephalopathy : MRI brain EEG no acute finding, no PRES.  AKI on CKD stage IV,Baseline creat 4.9-5.3 in 08/2019 but 3.2-3.8, 11 months ago. Still up, ?new baseline.  She sees Nephro at [facility] Dr Esmond Harps. She is aware abt possible need for hd in future.  Recent Labs  Lab 01/19/20 1520 01/19/20 1849 01/19/20 2234 01/20/20 0255 01/21/20 0213  BUN 51* 49* 49* 48* 52*  CREATININE 4.28* 4.18* 4.35* 4.49* 4.77*   Hypo-natremia from hyperglycemia. Monitor.  Status epilepticus: Likely from hypertensive urgency and HHNK: EEG MRI brain normal.  On Keppra 500 twice daily-continue, outpatient follow-up with neurology per neurology can be weaned off in the next 3 to 4 months if patient does not have any further seizures. Neurology s/o. Patient is not to drive untill cleared by neurology ( Pt informed), continue with seizure precaution and will need to follow-up with neurology 8 to 12 weeks  Morbid obesity with Body mass index is 40.54 kg/m.  advised wt loss and healthy lifestyle.  Will benefit with  O/p sleep apnea evaluation  Chronic diastolic CHF, compensated. Monitor. Wt at 243 lb. + 1.7 l net.  DVT prophylaxis:Heparin Code Status:Full Family Communication: plan of care discussed with patient at bedside. Disposition Plan: Remains inpatient pending clinical improvement in hyperglycemia , renal failure and further PT/OT eval/dispo. Home in 1-2 days.  Consultants: PCCM, neurology Procedures:see note-EEG Microbiology: Antimicrobials: Anti-infectives (From admission, onward)   None      Medications: Scheduled Meds: . amLODipine  10 mg Oral Daily  . aspirin  81 mg Oral Daily  . carvedilol  25 mg Oral BID WC  . Chlorhexidine Gluconate Cloth  6 each Topical Daily  .  cloNIDine  0.2 mg Oral TID  . heparin  5,000 Units Subcutaneous Q8H  . hydrALAZINE  100 mg Oral TID  . insulin aspart  0-9 Units Subcutaneous Q4H  . insulin aspart  5 Units Subcutaneous TID WC  . insulin glargine  20 Units Subcutaneous STAT  . insulin glargine  25 Units Subcutaneous BID  . isosorbide mononitrate  90 mg Oral BID  . levETIRAcetam  500 mg Oral BID  . potassium  chloride  20 mEq Oral Once  . pravastatin  20 mg Oral q1800   Continuous Infusions: . sodium chloride 75 mL/hr at 01/21/20 0139    Objective: Vitals:   01/21/20 0900 01/21/20 1000 01/21/20 1100 01/21/20 1104  BP: (!) 155/75 127/65 135/78   Pulse: 71 66 71   Resp:      Temp:      TempSrc:      SpO2: 95% 92% 96%   Weight:    110.5 kg  Height:    5\' 5"  (1.651 m)    Intake/Output Summary (Last 24 hours) at 01/21/2020 1125 Last data filed at 01/21/2020 1100 Gross per 24 hour  Intake 1269.79 ml  Output 900 ml  Net 369.79 ml   Filed Weights   01/21/20 0500 01/21/20 1104  Weight: 110.5 kg 110.5 kg   Weight change:   Body mass index is 40.54 kg/m.  Intake/Output from previous day: 01/20 0701 - 01/21 0700 In: 1294.7 [I.V.:1294.7] Out: 1200 [Urine:1200] Intake/Output this shift: Total I/O In: 274.9 [P.O.:120; I.V.:154.9] Out: -   Examination:  General exam: AAOX3, awake, ill-looking, old for age, on RA.  Morbidly obese. HEENT:Oral mucosa moist, Ear/Nose WNL grossly, dentition normal. Respiratory system: Diminished at the base,no wheezing or crackles,no use of accessory muscle Cardiovascular system: S1 & S2 +, No JVD,. Gastrointestinal system: Abdomen soft, NT,ND, BS+ Nervous System:Alert, awake, moving extremities and grossly nonfocal Extremities: No edema, distal peripheral pulses palpable.  Skin: No rashes,no icterus. MSK: Normal muscle bulk,tone, power   Data Reviewed: I have personally reviewed following labs and imaging studies  CBC: Recent Labs  Lab 01/18/20 2151 01/20/20 0255 01/21/20 0213  WBC 7.0 11.6* 8.1  NEUTROABS 5.3  --   --   HGB 12.0 11.1* 10.6*  HCT 36.0 31.9* 31.5*  MCV 75.8* 74.2* 75.7*  PLT 270 331 Q000111Q   Basic Metabolic Panel: Recent Labs  Lab 01/19/20 0801 01/19/20 1056 01/19/20 1520 01/19/20 1849 01/19/20 2234 01/20/20 0255 01/21/20 0213  NA 128*   < > 131* 134* 135 131* 135  K 3.0*   < > 3.0* 3.4* 3.1* 3.3* 3.1*  CL 96*   <  > 101 107 108 104 107  CO2 19*   < > 20* 18* 18* 18* 16*  GLUCOSE 590*   < > 125* 139* 105* 206* 245*  BUN 54*   < > 51* 49* 49* 48* 52*  CREATININE 4.44*   < > 4.28* 4.18* 4.35* 4.49* 4.77*  CALCIUM 8.8*   < > 8.7* 8.9 8.7* 8.4* 8.5*  MG 2.2  --   --   --   --  1.9 2.2  PHOS  --   --   --   --   --  3.5 3.9   < > = values in this interval not displayed.   GFR: Estimated Creatinine Clearance: 18.6 mL/min (A) (by C-G formula based on SCr of 4.77 mg/dL (H)). Liver Function Tests: Recent Labs  Lab 01/18/20 2151  AST 16  ALT 17  ALKPHOS 182*  BILITOT 0.8  PROT 7.6  ALBUMIN 3.1*   No results for input(s): LIPASE, AMYLASE in the last 168 hours. No results for input(s): AMMONIA in the last 168 hours. Coagulation Profile: Recent Labs  Lab 01/18/20 2151  INR 0.9   Cardiac Enzymes: No results for input(s): CKTOTAL, CKMB, CKMBINDEX, TROPONINI in the last 168 hours. BNP (last 3 results) No results for input(s): PROBNP in the last 8760 hours. HbA1C: Recent Labs    01/19/20 1056  HGBA1C 16.4*   CBG: Recent Labs  Lab 01/21/20 0253 01/21/20 0354 01/21/20 0507 01/21/20 0606 01/21/20 0726  GLUCAP 241* 148* 151* 152* 113*   Lipid Profile: Recent Labs    01/19/20 1245  CHOL 204*  HDL 42  LDLCALC 130*  TRIG 159*  CHOLHDL 4.9   Thyroid Function Tests: No results for input(s): TSH, T4TOTAL, FREET4, T3FREE, THYROIDAB in the last 72 hours. Anemia Panel: No results for input(s): VITAMINB12, FOLATE, FERRITIN, TIBC, IRON, RETICCTPCT in the last 72 hours. Sepsis Labs: No results for input(s): PROCALCITON, LATICACIDVEN in the last 168 hours.  Recent Results (from the past 240 hour(s))  Respiratory Panel by RT PCR (Flu A&B, Covid) - Nasopharyngeal Swab     Status: None   Collection Time: 01/18/20 10:31 PM   Specimen: Nasopharyngeal Swab  Result Value Ref Range Status   SARS Coronavirus 2 by RT PCR NEGATIVE NEGATIVE Final    Comment: (NOTE) SARS-CoV-2 target nucleic acids  are NOT DETECTED. The SARS-CoV-2 RNA is generally detectable in upper respiratoy specimens during the acute phase of infection. The lowest concentration of SARS-CoV-2 viral copies this assay can detect is 131 copies/mL. A negative result does not preclude SARS-Cov-2 infection and should not be used as the sole basis for treatment or other patient management decisions. A negative result may occur with  improper specimen collection/handling, submission of specimen other than nasopharyngeal swab, presence of viral mutation(s) within the areas targeted by this assay, and inadequate number of viral copies (<131 copies/mL). A negative result must be combined with clinical observations, patient history, and epidemiological information. The expected result is Negative. Fact Sheet for Patients:  PinkCheek.be Fact Sheet for Healthcare Providers:  GravelBags.it This test is not yet ap proved or cleared by the [place] FDA and  has been authorized for detection and/or diagnosis of SARS-CoV-2 by FDA under an Emergency Use Authorization (EUA). This EUA will remain  in effect (meaning this test can be used) for the duration of the COVID-19 declaration under Section 564(b)(1) of the Act, 21 U.S.C. section 360bbb-3(b)(1), unless the authorization is terminated or revoked sooner.    Influenza A by PCR NEGATIVE NEGATIVE Final   Influenza B by PCR NEGATIVE NEGATIVE Final    Comment: (NOTE) The Xpert Xpress SARS-CoV-2/FLU/RSV assay is intended as an aid in  the diagnosis of influenza from Nasopharyngeal swab specimens and  should not be used as a sole basis for treatment. Nasal washings and  aspirates are unacceptable for Xpert Xpress SARS-CoV-2/FLU/RSV  testing. Fact Sheet for Patients: PinkCheek.be Fact Sheet for Healthcare Providers: GravelBags.it This test is not yet approved or  cleared by the [place] FDA and  has been authorized for detection and/or diagnosis of SARS-CoV-2 by  FDA under an Emergency Use Authorization (EUA). This EUA will remain  in effect (meaning this test can be used) for the duration of the  Covid-19 declaration under Section 564(b)(1) of the Act, 21  U.S.C. section 360bbb-3(b)(1), unless the authorization is  terminated or  revoked. Performed at [facility], [address] [place], [place] [address]   MRSA PCR Screening     Status: None   Collection Time: 01/19/20 10:07 AM   Specimen: Nasal Mucosa; Nasopharyngeal  Result Value Ref Range Status   MRSA by PCR NEGATIVE NEGATIVE Final    Comment:        The GeneXpert MRSA Assay (FDA approved for NASAL specimens only), is one component of a comprehensive MRSA colonization surveillance program. It is not intended to diagnose MRSA infection nor to guide or monitor treatment for MRSA infections. Performed at [facility], [place] [address], [place], [place] [address]       Radiology Studies: EEG  Result Date: 01/19/2020 Lora Havens, MD     01/19/2020 12:39 PM Patient Name: Anely Charleston MRN: [id number] Epilepsy Attending: Lora Havens Referring Physician/Provider: Dr. Marcelle Overlie Date: 1/90/2021 Duration: 23.32 minutes Patient history: [age]-year-old female with history of hypertension and CKD who presented with status epilepticus, hypertensive urgency and acute respiratory EEG 12 for seizures. Level of alertness: Awake, asleep AEDs during EEG study: Keppra Technical aspects: This EEG study was done with scalp electrodes positioned according to the 10-20 International system of electrode placement. Electrical activity was acquired at a sampling rate of 500Hz  and reviewed with a high frequency filter of 70Hz  and a low frequency filter of 1Hz . EEG data were recorded continuously and digitally stored. Description: During awake state, no clear posterior dominant was  seen.  Sleep was characterized by sleep spindles (12 to 14 Hz), maximal frontocentral.  EEG showed polymorphic generalized 8 to 9 Hz alpha activity.  Hyperventilation photic summation were not performed. IMPRESSION: This study is within normal limits. No seizures or epileptiform discharges were seen throughout the recording. Lora Havens   MR BRAIN WO CONTRAST  Result Date: 01/19/2020 CLINICAL DATA:  Malignant hypertension.  Seizure. EXAM: MRI HEAD WITHOUT CONTRAST TECHNIQUE: Multiplanar, multiecho pulse sequences of the brain and surrounding structures were obtained without intravenous contrast. COMPARISON:  None. FINDINGS: Brain: Ventricle size and cerebral volume. Negative for acute or chronic infarct. No cerebral edema. No evidence of PRES. Negative for hemorrhage or mass. No fluid collection. 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 Vascular: Normal arterial flow voids Skull and upper cervical spine: No focal skeletal lesion. Sinuses/Orbits: Negative Other: None IMPRESSION: Image quality degraded by mild motion. Negative MRI head.  No acute infarct.  Negative for PRES Electronically Signed   By: Franchot Gallo M.D.   On: 01/19/2020 19:49   DG Chest Port 1 View  Result Date: 01/20/2020 CLINICAL DATA:  Seizure activity, possible aspiration EXAM: PORTABLE CHEST 1 VIEW COMPARISON:  01/19/2020 FINDINGS: Cardiac shadow remains prominent. The lungs are well aerated bilaterally. Minimal right basilar atelectasis is seen. No other focal abnormality is noted. IMPRESSION: Mild right basilar atelectasis. Electronically Signed   By: Inez Catalina M.D.   On: 01/20/2020 08:17      LOS: 2 days   Time spent: More than 50% of that time was spent in counseling and/or coordination of care.  Antonieta Pert, MD [facility]  01/21/2020, 11:25 AM

## 2020-01-21 NOTE — Evaluation (Signed)
Physical Therapy Evaluation Patient Details Name: Renee [name] MRN: [id number] DOB: 1976/01/15 Today's Date: 01/21/2020   History of Present Illness  44 yo admitted with tonic clonic seizure in setting of HTN emergency and hyperglycemic hyperosmolar syndrome. PMHx: HTN, CKD  Clinical Impression  Pt with initially flat affect and reports a bit tired. Pt with good mobility overall with decreased stride and activity tolerance reporting feeling weak and off. Pt with 2 periods of elevated HR to 120 with gait with pt needing to stop and support self at those time and with return of HR to mid 80s she reported feeling fine. BP post gait 156/94 with sats 97% on RA. Pt with decreased balance and activity tolerance who will benefit from acute therapy to maximize independence and safety for return home. Encouraged pt to ambulate to bathroom with staff throughout the day and sit up for all meals.      Follow Up Recommendations No PT follow up    Equipment Recommendations  None recommended by PT    Recommendations for Other Services       Precautions / Restrictions Precautions Precautions: Fall      Mobility  Bed Mobility Overal bed mobility: Modified Independent                Transfers Overall transfer level: Modified independent                  Ambulation/Gait Ambulation/Gait assistance: Min guard Gait Distance (Feet): 300 Feet Assistive device: None;IV Pole Gait Pattern/deviations: Step-through pattern;Decreased stride length   Gait velocity interpretation: 1.31 - 2.62 ft/sec, indicative of limited community ambulator General Gait Details: pt intitiating gait without UE assist with 3 standing rests with report of feeling "off" and "weak". pt holding IV pole last 100' for additional support and security  Stairs Stairs: (deferred due to pt not yet confidant with walking)          Wheelchair Mobility    Modified Rankin (Stroke Patients Only)       Balance  Overall balance assessment: Mild deficits observed, not formally tested                                           Pertinent Vitals/Pain Pain Assessment: No/denies pain    Home Living Family/patient expects to be discharged to:: Private residence Living Arrangements: Spouse/significant other;Children Available Help at Discharge: Family;Available 24 hours/day Type of Home: House Home Access: Stairs to enter   [employer] of Steps: 2 Home Layout: One level Home Equipment: None      Prior Function Level of Independence: Independent               Hand Dominance        Extremity/Trunk Assessment   Upper Extremity Assessment Upper Extremity Assessment: Overall WFL for tasks assessed    Lower Extremity Assessment Lower Extremity Assessment: Overall WFL for tasks assessed(pt initially would not resist movement but with repetition demonstrated 4/5)    Cervical / Trunk Assessment Cervical / Trunk Assessment: Normal  Communication   Communication: No difficulties  Cognition Arousal/Alertness: Awake/alert Behavior During Therapy: WFL for tasks assessed/performed Overall Cognitive Status: Within Functional Limits for tasks assessed                                 General Comments: slightly  slow to process at times with questions repeated      General Comments      Exercises     Assessment/Plan    PT Assessment Patient needs continued PT services  PT Problem List Decreased mobility;Decreased activity tolerance       PT Treatment Interventions Gait training;Balance training;Stair training;Functional mobility training;Therapeutic activities;Patient/family education    PT Goals (Current goals can be found in the Care Plan section)  Acute Rehab PT Goals Patient Stated Goal: return home, crafting PT Goal Formulation: With patient Time For Goal Achievement: 01/28/20 Potential to Achieve Goals: Good    Frequency Min  3X/week   Barriers to discharge        Co-evaluation               AM-PAC PT "6 Clicks" Mobility  Outcome Measure Help needed turning from your back to your side while in a flat bed without using bedrails?: None Help needed moving from lying on your back to sitting on the side of a flat bed without using bedrails?: None Help needed moving to and from a bed to a chair (including a wheelchair)?: A Little Help needed standing up from a chair using your arms (e.g., wheelchair or bedside chair)?: A Little Help needed to walk in hospital room?: A Little Help needed climbing 3-5 steps with a railing? : A Little 6 Click Score: 20    End of Session Equipment Utilized During Treatment: Gait belt Activity Tolerance: Patient tolerated treatment well Patient left: in chair;with call bell/phone within reach;with chair alarm set Nurse Communication: Mobility status PT Visit Diagnosis: Other abnormalities of gait and mobility (R26.89)    Time: [id number] PT Time Calculation (min) (ACUTE ONLY): 19 min   Charges:   PT Evaluation $PT Eval Moderate Complexity: 1 Mod          Eesa Justiss P, PT Acute Rehabilitation Services Pager: [phone] Office: [place] 01/21/2020, 1:19 PM

## 2020-01-22 LAB — CBC
HCT: 30.3 % — ABNORMAL LOW (ref 36.0–46.0)
Hemoglobin: 10.1 g/dL — ABNORMAL LOW (ref 12.0–15.0)
MCH: 25.4 pg — ABNORMAL LOW (ref 26.0–34.0)
MCHC: 33.3 g/dL (ref 30.0–36.0)
MCV: 76.3 fL — ABNORMAL LOW (ref 80.0–100.0)
Platelets: 295 10*3/uL (ref 150–400)
RBC: 3.97 MIL/uL (ref 3.87–5.11)
RDW: 15.1 % (ref 11.5–15.5)
WBC: 8.9 10*3/uL (ref 4.0–10.5)
nRBC: 0.2 % (ref 0.0–0.2)

## 2020-01-22 LAB — GLUCOSE, CAPILLARY
Glucose-Capillary: 229 mg/dL — ABNORMAL HIGH (ref 70–99)
Glucose-Capillary: 191 mg/dL — ABNORMAL HIGH (ref 70–99)
Glucose-Capillary: 157 mg/dL — ABNORMAL HIGH (ref 70–99)
Glucose-Capillary: 172 mg/dL — ABNORMAL HIGH (ref 70–99)
Glucose-Capillary: 195 mg/dL — ABNORMAL HIGH (ref 70–99)

## 2020-01-22 LAB — BASIC METABOLIC PANEL
Anion gap: 11 (ref 5–15)
BUN: 58 mg/dL — ABNORMAL HIGH (ref 6–20)
CO2: 15 mmol/L — ABNORMAL LOW (ref 22–32)
Calcium: 8.3 mg/dL — ABNORMAL LOW (ref 8.9–10.3)
Chloride: 106 mmol/L (ref 98–111)
Creatinine, Ser: 4.98 mg/dL — ABNORMAL HIGH (ref 0.44–1.00)
GFR calc Af Amer: 11 mL/min — ABNORMAL LOW (ref >60)
GFR calc non Af Amer: 10 mL/min — ABNORMAL LOW (ref >60)
Glucose, Bld: 240 mg/dL — ABNORMAL HIGH (ref 70–99)
Potassium: 3.5 mmol/L (ref 3.5–5.1)
Sodium: 132 mmol/L — ABNORMAL LOW (ref 135–145)

## 2020-01-22 MED ORDER — INSULIN ASPART 100 UNIT/ML ~~LOC~~ SOLN: 10.0000 [IU] | Freq: Three times a day (TID) | SUBCUTANEOUS | Status: DC

## 2020-01-22 MED ORDER — INSULIN STARTER KIT- PEN NEEDLES (ENGLISH)
1.0000 | Freq: Once | Status: AC
  Administered 2020-01-22: 1
  Filled 2020-01-22: qty 1

## 2020-01-22 MED ORDER — SODIUM BICARBONATE 650 MG PO TABS
650.0000 mg | ORAL_TABLET | Freq: Three times a day (TID) | ORAL | Status: DC
  Administered 2020-01-22 – 2020-01-23 (×4): 650 mg via ORAL
  Filled 2020-01-22 (×4): qty 1

## 2020-01-22 MED ORDER — TORSEMIDE 20 MG PO TABS
20.0000 mg | ORAL_TABLET | Freq: Every day | ORAL | Status: DC
  Administered 2020-01-22 – 2020-01-23 (×2): 20 mg via ORAL
  Filled 2020-01-22 (×2): qty 1

## 2020-01-22 MED ORDER — LIVING WELL WITH DIABETES BOOK
Freq: Once | Status: AC
  Filled 2020-01-22: qty 1

## 2020-01-22 MED ORDER — INSULIN ASPART 100 UNIT/ML ~~LOC~~ SOLN
8.0000 [IU] | Freq: Three times a day (TID) | SUBCUTANEOUS | Status: DC
  Administered 2020-01-22 – 2020-01-23 (×5): 8 [IU] via SUBCUTANEOUS

## 2020-01-22 NOTE — Progress Notes (Signed)
PROGRESS NOTE    Renee [name]  [id number] DOB: 05/29/1976 DOA: 01/19/2020 PCP: Minda Ditto, MD   Brief Narrative: 32 yF w CHF, CKD IV (Baseline creat 4.9-5.3 in 08/2019 but 3.2-3.8, 11 months ago), tx from [facility]1/19,w/ acute onset slurred speech and altered mental status. On presentation BP 202/96, CBG 1200, sod 117, AKI creat 4.5.  Patient was admitted to ICU and treated for HHNK/new onset DM hba1c 16,seizure/AMS,New HLD-EEG/MRI no PRES, and was normal without acute finding.  Patient was started on insulin, Keppra.  1/21- resumed on insulin drip in ICU Tx to [facility] 1/21  Subjective:  Denies any nausea vomiting fever chills.  Tolerating diet.  Feels pretty good. blood pressure 149-170s, has been afebrile, blood sugar poorly controlled 113-229. Labs showed potassium 3.5, creatinine 4.9, bicarb 14  Assessment & Plan:   New onset type 2 diabetes mellitus/HHNK, poorly controlled HbA1c at 16.  Blood sugar borderline controlled. Continue Levemir 25 units twice daily, increase NovoLog to 8 units 3 times daily, continue sliding scale insulin.She was prediabetic 1-2 yr ago and was on insulin while she was pregnant and aware abt insulin use side effects etcs. Recent Labs  Lab 01/21/20 1207 01/21/20 1530 01/21/20 1935 01/21/20 2302 01/22/20 0419  GLUCAP 226* 227* 212* 214* 229*   Hypertensive emergency: Blood pressure borderline controlled on multiple regimen. Resume torsemide at 20 mg daily(on 40 at home), stop ivf.  Monitor blood pressure and creat in am.  Acute metabolic encephalopathy : MRI brain EEG no acute finding, no PRES. mental status improved  AKI on CKD stage IV,Baseline creat 4.9-5.3 in 08/2019 but 3.2-3.8, 11 months ago. Still up, ?new baseline. She sees Nephro at [facility] Dr Esmond Harps. She is aware abt possible need for hd in future. Add PO oral bicarbonate for metabolic acidosis.  Resume Torsemide at lower dose 20 mg. Monitor BMP overnight. Recent Labs  Lab  01/19/20 1849 01/19/20 2234 01/20/20 0255 01/21/20 0213 01/22/20 0302  BUN 49* 49* 48* 52* 58*  CREATININE 4.18* 4.35* 4.49* 4.77* 4.98*   Hypo-natremia from hyperglycemia. Stable. Monitor.  Status epilepticus: Likely from hypertensive urgency and HHNK: EEG MRI brain normal.  On Keppra 500 twice daily-continue, outpatient follow-up with neurology per neurology can be weaned off in the next 3 to 4 months if patient does not have any further seizures. Neurology s/o. Patient is not to drive untill cleared by neurology ( Pt informed), continue with seizure precaution and will need to follow-up with neurology 8 to 12 weeks  Morbid obesity with Body mass index is 40.67 kg/m.  advised wt loss and healthy lifestyle.  Will benefit with  O/p sleep apnea evaluation  Chronic diastolic CHF, compensated. Monitor. Wt at 243 lb. + 1.7 l net.  DVT prophylaxis:Heparin Code Status:Full Family Communication: plan of care discussed with patient at bedside. Disposition Plan: Remains inpatient pending stabilization of blood pressure and blood sugar- anticipate discharge in the morning if bp, sugar and creat stable, resumed torsemide today. Encourage ambulation pt/ot  Consultants: PCCM, neurology Procedures:see note-EEG Microbiology: Antimicrobials: Anti-infectives (From admission, onward)   None      Medications: Scheduled Meds: . amLODipine  10 mg Oral Daily  . aspirin  81 mg Oral Daily  . carvedilol  25 mg Oral BID WC  . Chlorhexidine Gluconate Cloth  6 each Topical Daily  . cloNIDine  0.2 mg Oral TID  . heparin  5,000 Units Subcutaneous Q8H  . hydrALAZINE  100 mg Oral TID  . insulin aspart  0-9  Units Subcutaneous Q4H  . insulin aspart  10 Units Subcutaneous TID WC  . insulin glargine  25 Units Subcutaneous BID  . isosorbide mononitrate  90 mg Oral BID  . levETIRAcetam  500 mg Oral BID  . pravastatin  20 mg Oral q1800   Continuous Infusions: . sodium chloride 75 mL/hr at 01/21/20 0139     Objective: Vitals:   01/21/20 2000 01/21/20 2040 01/21/20 2358 01/22/20 0408  BP:  (!) 149/78 (!) 158/70 (!) 157/78  Pulse:  75 73 73  Resp:  18 17 (!) 24  Temp: 98.4 F (36.9 C) 98.1 F (36.7 C) 97.6 F (36.4 C) 98.2 F (36.8 C)  TempSrc: Oral Oral Oral Oral  SpO2:  94% 98% 97%  Weight:    110.9 kg  Height:        Intake/Output Summary (Last 24 hours) at 01/22/2020 0717 Last data filed at 01/21/2020 1100 Gross per 24 hour  Intake 274.85 ml  Output --  Net 274.85 ml   Filed Weights   01/21/20 0500 01/21/20 1104 01/22/20 0408  Weight: 110.5 kg 110.5 kg 110.9 kg   Weight change: 0 kg  Body mass index is 40.67 kg/m.  Intake/Output from previous day: 01/21 0701 - 01/22 0700 In: 274.9 [P.O.:120; I.V.:154.9] Out: -  Intake/Output this shift: No intake/output data recorded.  Examination:  General exam: Alert awake oriented, morbidly obese, on room air, not in acute distress.  HEENT:Oral mucosa moist, Ear/Nose WNL grossly, dentition normal. Respiratory system: Bilaterally clear breath sounds, no wheezing or crackles,no use of accessory muscle Cardiovascular system: S1 & S2 +, No JVD,. Gastrointestinal system: Abdomen soft, NT,ND/obese, BS+ Nervous System:Alert, awake, moving extremities and grossly nonfocal Extremities: No edema, distal peripheral pulses palpable.  Skin: No rashes,no icterus. MSK: Normal muscle bulk,tone, power  Data Reviewed: I have personally reviewed following labs and imaging studies  CBC: Recent Labs  Lab 01/18/20 2151 01/20/20 0255 01/21/20 0213 01/22/20 0302  WBC 7.0 11.6* 8.1 8.9  NEUTROABS 5.3  --   --   --   HGB 12.0 11.1* 10.6* 10.1*  HCT 36.0 31.9* 31.5* 30.3*  MCV 75.8* 74.2* 75.7* 76.3*  PLT 270 331 283 [id number]   Basic Metabolic Panel: Recent Labs  Lab 01/19/20 0801 01/19/20 1056 01/19/20 1849 01/19/20 2234 01/20/20 0255 01/21/20 0213 01/22/20 0302  NA 128*   < > 134* 135 131* 135 132*  K 3.0*   < > 3.4* 3.1* 3.3*  3.1* 3.5  CL 96*   < > 107 108 104 107 106  CO2 19*   < > 18* 18* 18* 16* 15*  GLUCOSE 590*   < > 139* 105* 206* 245* 240*  BUN 54*   < > 49* 49* 48* 52* 58*  CREATININE 4.44*   < > 4.18* 4.35* 4.49* 4.77* 4.98*  CALCIUM 8.8*   < > 8.9 8.7* 8.4* 8.5* 8.3*  MG 2.2  --   --   --  1.9 2.2  --   PHOS  --   --   --   --  3.5 3.9  --    < > = values in this interval not displayed.   GFR: Estimated Creatinine Clearance: 17.9 mL/min (A) (by C-G formula based on SCr of 4.98 mg/dL (H)). Liver Function Tests: Recent Labs  Lab 01/18/20 2151  AST 16  ALT 17  ALKPHOS 182*  BILITOT 0.8  PROT 7.6  ALBUMIN 3.1*   No results for input(s): LIPASE, AMYLASE in the last  168 hours. No results for input(s): AMMONIA in the last 168 hours. Coagulation Profile: Recent Labs  Lab 01/18/20 2151  INR 0.9   Cardiac Enzymes: No results for input(s): CKTOTAL, CKMB, CKMBINDEX, TROPONINI in the last 168 hours. BNP (last 3 results) No results for input(s): PROBNP in the last 8760 hours. HbA1C: Recent Labs    01/19/20 1056  HGBA1C 16.4*   CBG: Recent Labs  Lab 01/21/20 1207 01/21/20 1530 01/21/20 1935 01/21/20 2302 01/22/20 0419  GLUCAP 226* 227* 212* 214* 229*   Lipid Profile: Recent Labs    01/19/20 1245  CHOL 204*  HDL 42  LDLCALC 130*  TRIG 159*  CHOLHDL 4.9   Thyroid Function Tests: No results for input(s): TSH, T4TOTAL, FREET4, T3FREE, THYROIDAB in the last 72 hours. Anemia Panel: No results for input(s): VITAMINB12, FOLATE, FERRITIN, TIBC, IRON, RETICCTPCT in the last 72 hours. Sepsis Labs: No results for input(s): PROCALCITON, LATICACIDVEN in the last 168 hours.  Recent Results (from the past 240 hour(s))  Respiratory Panel by RT PCR (Flu A&B, Covid) - Nasopharyngeal Swab     Status: None   Collection Time: 01/18/20 10:31 PM   Specimen: Nasopharyngeal Swab  Result Value Ref Range Status   SARS Coronavirus 2 by RT PCR NEGATIVE NEGATIVE Final    Comment: (NOTE) SARS-CoV-2  target nucleic acids are NOT DETECTED. The SARS-CoV-2 RNA is generally detectable in upper respiratoy specimens during the acute phase of infection. The lowest concentration of SARS-CoV-2 viral copies this assay can detect is 131 copies/mL. A negative result does not preclude SARS-Cov-2 infection and should not be used as the sole basis for treatment or other patient management decisions. A negative result may occur with  improper specimen collection/handling, submission of specimen other than nasopharyngeal swab, presence of viral mutation(s) within the areas targeted by this assay, and inadequate number of viral copies (<131 copies/mL). A negative result must be combined with clinical observations, patient history, and epidemiological information. The expected result is Negative. Fact Sheet for Patients:  PinkCheek.be Fact Sheet for Healthcare Providers:  GravelBags.it This test is not yet ap proved or cleared by the [place] FDA and  has been authorized for detection and/or diagnosis of SARS-CoV-2 by FDA under an Emergency Use Authorization (EUA). This EUA will remain  in effect (meaning this test can be used) for the duration of the COVID-19 declaration under Section 564(b)(1) of the Act, 21 U.S.C. section 360bbb-3(b)(1), unless the authorization is terminated or revoked sooner.    Influenza A by PCR NEGATIVE NEGATIVE Final   Influenza B by PCR NEGATIVE NEGATIVE Final    Comment: (NOTE) The Xpert Xpress SARS-CoV-2/FLU/RSV assay is intended as an aid in  the diagnosis of influenza from Nasopharyngeal swab specimens and  should not be used as a sole basis for treatment. Nasal washings and  aspirates are unacceptable for Xpert Xpress SARS-CoV-2/FLU/RSV  testing. Fact Sheet for Patients: PinkCheek.be Fact Sheet for Healthcare Providers: GravelBags.it This test is  not yet approved or cleared by the [place] FDA and  has been authorized for detection and/or diagnosis of SARS-CoV-2 by  FDA under an Emergency Use Authorization (EUA). This EUA will remain  in effect (meaning this test can be used) for the duration of the  Covid-19 declaration under Section 564(b)(1) of the Act, 21  U.S.C. section 360bbb-3(b)(1), unless the authorization is  terminated or revoked. Performed at [facility], [address], [place], [place] [address]   MRSA PCR Screening  Status: None   Collection Time: 01/19/20 10:07 AM   Specimen: Nasal Mucosa; Nasopharyngeal  Result Value Ref Range Status   MRSA by PCR NEGATIVE NEGATIVE Final    Comment:        The GeneXpert MRSA Assay (FDA approved for NASAL specimens only), is one component of a comprehensive MRSA colonization surveillance program. It is not intended to diagnose MRSA infection nor to guide or monitor treatment for MRSA infections. Performed at [facility], [place] [address], [place], [place] [address]       Radiology Studies: No results found.    LOS: 3 days   Time spent: More than 50% of that time was spent in counseling and/or coordination of care.  Antonieta Pert, MD [facility]  01/22/2020, 7:17 AM

## 2020-01-22 NOTE — Progress Notes (Signed)
Inpatient Diabetes Program Recommendations  AACE/ADA: New Consensus Statement on Inpatient Glycemic Control (2015)  Target Ranges:  Prepandial:   less than 140 mg/dL      Peak postprandial:   less than 180 mg/dL (1-2 hours)      Critically ill patients:  140 - 180 mg/dL   Lab Results  Component Value Date   GLUCAP 157 (H) 01/22/2020   HGBA1C 16.4 (H) 01/19/2020    Review of Glycemic Control  Diabetes history: New Diagnosis this admission  Current orders for Inpatient glycemic control:  Lantus 25 units bid Novolog 0-9 units Q4 hours Novolog 8 units tid meal coverage   Spoke with patient about new diabetes diagnosis.  Discussed A1C results (16.4% this admission) and explained what an A1C is. Discussed basic pathophysiology of DM Type 2, basic home care, importance of checking CBGs and maintaining good CBG control to prevent long-term and short-term complications. Reviewed glucose and A1C goals.  Reviewed signs and symptoms of hyperglycemia and hypoglycemia along with treatment for both. Discussed impact of nutrition, exercise, stress, sickness, and medications on diabetes control. Reviewed Living Well with diabetes booklet and encouraged patient to read through entire book. Provided patient with handout information on Reli-On products. Asked patient to check her glucose at least 2 times per day and to keep a log book of glucose readings and insulin taken. Explained how the doctor he follows up with can use the log book to continue to make insulin adjustments if needed.   Educated patient on insulin pen use at home. Reviewed contents of insulin flexpen starter kit. Reviewed all steps if insulin pen including attachment of needle, 2-unit air shot, dialing up dose, giving injection, removing needle, disposal of sharps, storage of unused insulin, disposal of insulin etc. Patient able to provide successful return demonstration. Also reviewed troubleshooting with insulin pen. MD to give patient Rxs  for insulin pens and insulin pen needles.  Patient verbalized understanding of information discussed and has no further questions at this time related to diabetes.     RNs to provide ongoing basic DM education at bedside with this patient and engage patient to actively check blood glucose and administer insulin injections.   Thanks, Tama Headings RN, MSN, BC-ADM Inpatient Diabetes Coordinator Team Pager [phone] (8a-5p)

## 2020-01-22 NOTE — Discharge Instructions (Signed)
Glucose Products:  ReliOnT glucose products raise low blood sugar fast. Tablets are free of fat, caffeine, sodium and gluten. They are portable and easy to carry, making it easier for people with diabetes to BE PREPARED for lows.  Glucose Tablets Available in 6 flavors . 10 ct...................................... $1.00 . 50 ct...................................... $3.98 Glucose Shot..................................$1.48 Glucose Gel....................................$3.44  Alcohol Swabs Alcohol swabs are used to sterilize your injection site. All of our swabs are individually wrapped for maximum safety, convenience and moisture retention. ReliOnT Alcohol Swabs . 100 ct Swabs..............................$1.00 . 400 ct Swabs..............................$3.74  Lancets ReliOnT offers three lancet options conveniently designed to work with almost every lancing device. Each features a protective disk, which guarantees sterility before testing. ReliOnT Lancets . 100 ct Lancets $1.56 . 200 ct Lancets $2.64 Available in Ultra-Thin, Thin & Micro-Thin ReliOnT 2-IN-1 Lancing Device . 50 ct Lancets..................................... $3.44 Available in 30 gauge and 25 gauge ReliOnT Lancing Device....................$5.84  Blood Glucose Monitors ReliOnT offers a full range of blood glucose testing options to provide an accurate, affordable system that meets each person's unique needs and preferences. Prime Meter....................................... $9.00 Prime Test Strips . 25 test strips.................................... $5.00 . 50 test strips.................................... $9.00 . 100 test strips.................................$17.88 Premier BLU Meter  ............  $18.98 Premier Voice Meter  .............  $14.98 Premier Test Strips . 50 test strips.................................... $9.00 . 100 test strips.................................$17.88 Premier [employer]   ............  $19.44 Kit includes: . 50 test strips . 10 lancets . Lancing device . Carry case  Ketone Test Strips . 50 test strips  ................  $6.64  Human Insulin  Novolin/ReliOnT (recombinant DNA origin) is manufactured for [employer] by [employer] Insulin* with Vial..........$24.88 Available in N, R, 70/30 Novolin/ReliOnT Insulin Pens*  .....  $42.88 Available in N, R, 70/30  Insulin Delivery ReliOnT syringes and pen needles provide precision technology, comfort and accuracy in insulin delivery at affordable prices. ReliOnT Pen Needles* . 50 ct....................................................$9.00 Available in 53m, 667m 63m20m 71m72mliOnT Insulin Syringes* . 100 ct ............ $12.58 Available in 29G, 30G & 31G (3/10cc, 1/2cc & 1cc units)

## 2020-01-22 NOTE — Progress Notes (Signed)
Physical Therapy Treatment Patient Details Name: Renee [name] MRN: [id number] DOB: Jul 13, 1976 Today's Date: 01/22/2020    History of Present Illness 44 yo admitted with tonic clonic seizure in setting of HTN emergency and hyperglycemic hyperosmolar syndrome. PMHx: HTN, CKD    PT Comments    Pt sitting EOB upon arrival of PT, agreeable to PT session prior to anticipated d/c over weekend. The pt continues to present with limitations in gait speed and endurance due to above dx, as well as reports of pain in B feet due to poor arch support. The pt was able to demo improved independence with ambulation today, but continues to use slowed gait speed and intermittent UE support due to pain in LE as well as breaks to "catch her breath" despite SpO2 remaining >95% on RA. Pt was educated on energy conservation, HEP for activity, and pain management strategies/footwear recommendations to ease B foot pain upon return home. Pt verbalized agreement with all education. PT will sign off as all pt education is complete and the pt is ambulating independently in her room without assist at this time. Please feel free to re-consult if there is a change in pt status or needs.    Follow Up Recommendations  No PT follow up     Equipment Recommendations  None recommended by PT    Recommendations for Other Services       Precautions / Restrictions Precautions Precautions: Fall Restrictions Weight Bearing Restrictions: No    Mobility  Bed Mobility Overal bed mobility: Modified Independent                Transfers Overall transfer level: Modified independent                  Ambulation/Gait Ambulation/Gait assistance: Min guard   Assistive device: None Gait Pattern/deviations: Step-through pattern;Decreased stride length;Wide base of support Gait velocity: 0.4 m/s Gait velocity interpretation: 1.31 - 2.62 ft/sec, indicative of limited community ambulator General Gait Details: pt able  to ambulate without AD or LOB, uses wide BOS with increased lateral sway. Pt intermittently holding rail due to reports of pain in B arches.   Stairs             Wheelchair Mobility    Modified Rankin (Stroke Patients Only)       Balance Overall balance assessment: Mild deficits observed, not formally tested                                          Cognition Arousal/Alertness: Awake/alert Behavior During Therapy: WFL for tasks assessed/performed;Flat affect Overall Cognitive Status: Within Functional Limits for tasks assessed                                 General Comments: slightly slow to process at times with questions repeated      Exercises      General Comments        Pertinent Vitals/Pain Pain Assessment: Faces Faces Pain Scale: Hurts a little bit Pain Location: arches of B feet Pain Descriptors / Indicators: Sore Pain Intervention(s): Limited activity within patient's tolerance;Monitored during session    Home Living                      Prior Function  PT Goals (current goals can now be found in the care plan section) Acute Rehab PT Goals Patient Stated Goal: return home, crafting PT Goal Formulation: With patient Time For Goal Achievement: 01/28/20 Potential to Achieve Goals: Good Progress towards PT goals: Progressing toward goals    Frequency    Min 3X/week      PT Plan Current plan remains appropriate    Co-evaluation              AM-PAC PT "6 Clicks" Mobility   Outcome Measure  Help needed turning from your back to your side while in a flat bed without using bedrails?: None Help needed moving from lying on your back to sitting on the side of a flat bed without using bedrails?: None Help needed moving to and from a bed to a chair (including a wheelchair)?: None Help needed standing up from a chair using your arms (e.g., wheelchair or bedside chair)?: None Help needed to  walk in hospital room?: None Help needed climbing 3-5 steps with a railing? : A Little 6 Click Score: 23    End of Session Equipment Utilized During Treatment: Gait belt Activity Tolerance: Patient tolerated treatment well Patient left: with call bell/phone within reach;in bed Nurse Communication: Mobility status PT Visit Diagnosis: Other abnormalities of gait and mobility (R26.89)     Time: [id number] PT Time Calculation (min) (ACUTE ONLY): 21 min  Charges:  $Gait Training: 8-22 mins                     Karma Ganja, PT, DPT   Acute Rehabilitation Department Pager #: [phone]   Otho Bellows 01/22/2020, 3:25 PM

## 2020-01-23 DIAGNOSIS — I10 Essential (primary) hypertension: Secondary | ICD-10-CM

## 2020-01-23 DIAGNOSIS — E1122 Type 2 diabetes mellitus with diabetic chronic kidney disease: Secondary | ICD-10-CM

## 2020-01-23 DIAGNOSIS — N184 Chronic kidney disease, stage 4 (severe): Secondary | ICD-10-CM

## 2020-01-23 DIAGNOSIS — I5032 Chronic diastolic (congestive) heart failure: Secondary | ICD-10-CM

## 2020-01-23 LAB — CBC
HCT: 31.6 % — ABNORMAL LOW (ref 36.0–46.0)
Hemoglobin: 10.5 g/dL — ABNORMAL LOW (ref 12.0–15.0)
MCH: 25.4 pg — ABNORMAL LOW (ref 26.0–34.0)
MCHC: 33.2 g/dL (ref 30.0–36.0)
MCV: 76.5 fL — ABNORMAL LOW (ref 80.0–100.0)
Platelets: 296 10*3/uL (ref 150–400)
RBC: 4.13 MIL/uL (ref 3.87–5.11)
RDW: 14.9 % (ref 11.5–15.5)
WBC: 9.5 10*3/uL (ref 4.0–10.5)
nRBC: 0 % (ref 0.0–0.2)

## 2020-01-23 LAB — BASIC METABOLIC PANEL
Anion gap: 12 (ref 5–15)
BUN: 54 mg/dL — ABNORMAL HIGH (ref 6–20)
CO2: 16 mmol/L — ABNORMAL LOW (ref 22–32)
Calcium: 8.8 mg/dL — ABNORMAL LOW (ref 8.9–10.3)
Chloride: 110 mmol/L (ref 98–111)
Creatinine, Ser: 4.9 mg/dL — ABNORMAL HIGH (ref 0.44–1.00)
GFR calc Af Amer: 12 mL/min — ABNORMAL LOW (ref >60)
GFR calc non Af Amer: 10 mL/min — ABNORMAL LOW (ref >60)
Glucose, Bld: 130 mg/dL — ABNORMAL HIGH (ref 70–99)
Potassium: 3.4 mmol/L — ABNORMAL LOW (ref 3.5–5.1)
Sodium: 138 mmol/L (ref 135–145)

## 2020-01-23 LAB — GLUCOSE, CAPILLARY
Glucose-Capillary: 129 mg/dL — ABNORMAL HIGH (ref 70–99)
Glucose-Capillary: 130 mg/dL — ABNORMAL HIGH (ref 70–99)
Glucose-Capillary: 149 mg/dL — ABNORMAL HIGH (ref 70–99)
Glucose-Capillary: 164 mg/dL — ABNORMAL HIGH (ref 70–99)

## 2020-01-23 MED ORDER — INSULIN ASPART 100 UNIT/ML ~~LOC~~ SOLN: 8.0000 [IU] | Freq: Three times a day (TID) | SUBCUTANEOUS | 11 refills | Status: DC

## 2020-01-23 MED ORDER — LEVETIRACETAM 500 MG PO TABS: 500.0000 mg | ORAL_TABLET | Freq: Two times a day (BID) | ORAL | 1 refills | Status: DC

## 2020-01-23 MED ORDER — POTASSIUM CHLORIDE 20 MEQ PO PACK
40.0000 meq | PACK | Freq: Once | ORAL | Status: AC
  Administered 2020-01-23: 40 meq via ORAL
  Filled 2020-01-23: qty 2

## 2020-01-23 MED ORDER — INSULIN GLARGINE 100 UNIT/ML ~~LOC~~ SOLN: 25.0000 [IU] | Freq: Two times a day (BID) | SUBCUTANEOUS | 11 refills | Status: DC

## 2020-01-23 MED ORDER — ASPIRIN 81 MG PO CHEW: 81.0000 mg | CHEWABLE_TABLET | Freq: Every day | ORAL | 1 refills | Status: DC

## 2020-01-23 MED ORDER — LIDOCAINE 5 % EX PTCH
1.0000 | MEDICATED_PATCH | CUTANEOUS | Status: DC
  Administered 2020-01-23: 1 via TRANSDERMAL
  Filled 2020-01-23: qty 1

## 2020-01-23 MED ORDER — POLYETHYLENE GLYCOL 3350 17 G PO PACK
17.0000 g | PACK | Freq: Every day | ORAL | Status: DC
  Administered 2020-01-23: 17 g via ORAL
  Filled 2020-01-23: qty 1

## 2020-01-23 MED ORDER — SODIUM BICARBONATE 650 MG PO TABS: 650.0000 mg | ORAL_TABLET | Freq: Three times a day (TID) | ORAL | 1 refills | Status: DC

## 2020-01-23 MED ORDER — INSULIN ASPART 100 UNIT/ML ~~LOC~~ SOLN: 0.0000 [IU] | SUBCUTANEOUS | 11 refills | Status: DC

## 2020-01-23 MED ORDER — PRAVASTATIN SODIUM 20 MG PO TABS: 20.0000 mg | ORAL_TABLET | Freq: Every day | ORAL | 1 refills | Status: DC

## 2020-01-23 NOTE — Progress Notes (Signed)
Discharge AVS meds take and those due reviewed with pt. Follow up appointments and when to call MD reviewed. All questions and concerns addressed. No further questions at this time. D/c IV and TELE, CCMD notified. D/C home per orders.  

## 2020-01-23 NOTE — Discharge Summary (Signed)
Physician Discharge Summary  Monteria Hasselman [id number] DOB: 06-03-1976 DOA: 01/19/2020  PCP: Minda Ditto, MD  Admit date: 01/19/2020 Discharge date: 01/23/2020  Admitted From: Home Disposition:  Home  Recommendations for Outpatient Follow-up:  1. Follow up with PCP in 1-2 weeks 2. F/U with nephrology in 2 weeks 3. F/U with neurology in 8 weeks 4. Please obtain BMP/CBC in one week  Home Health:No Equipment/Devices: None Discharge Condition:stable (Stable, guarded, hospice) CODE STATUS:Full Diet recommendation: Carb modified Brief/Interim Summary: 16 yF w CHF, CKD IV (Baseline creat 4.9-5.3 in 08/2019 but 3.2-3.8, 11 months ago), tx from [facility]/19,w/ acute onset slurred speech and altered mental status. On presentation BP 202/96, CBG 1200, sod 117, AKI creat 4.5.  Patient was admitted to ICU and treated for HHNK/new onset DM hba1c 16,seizure/AMS,New HLD-EEG/MRI no PRES, and was normal without acute finding.  Patient was started on insulin, Keppra.   New onset type 2 diabetes mellitus/HHNK, poorly controlled HbA1c at 16.  Blood sugar borderline controlled. Continue Levemir 25 units twice daily, increase NovoLog to 8 units 3 times daily, continue sliding scale insulin.She was prediabetic 1-2 yr ago and was on insulin while she was pregnant and aware abt insulin use side effects etcs. Last Labs          Recent Labs  Lab 01/21/20 1207 01/21/20 1530 01/21/20 1935 01/21/20 2302 01/22/20 0419  GLUCAP 226* 227* 212* 214* 229*     Hypertensive emergency: Blood pressure borderline controlled on multiple regimen. Resume torsemide at home dose upon discharge  Acute metabolic encephalopathy : MRI brain EEG no acute finding, no PRES. mental status improved  AKI on CKD stage IV,Baseline creat 4.9-5.3 in 08/2019 but 3.2-3.8, 11 months ago. Still up, ?new baseline. She sees Nephro at [facility] Dr Esmond Harps. She is aware abt possible need for hd in future. Add PO oral bicarbonate for  metabolic acidosis.  Resumed Torsemide at lower dose and will discharge home on previous dose  Hypo-natremia resolved  Status epilepticus: Likely from hypertensive urgency and HHNK: EEG MRI brain normal.  On Keppra 500 twice daily-continue, outpatient follow-up with neurology per neurology can be weaned off in the next 3 to 4 months if patient does not have any further seizures. Neurology s/o. Patient is not to drive untill cleared by neurology ( Pt informed), continue with seizure precaution and will need to follow-up with neurology 8 to 12 weeks  Morbid obesity with Body mass index is 40.67 kg/m.  advised wt loss and healthy lifestyle.  Will benefit with  O/p sleep apnea evaluation  Chronic diastolic CHF, compensated. Monitor. Wt at 243 lb. + 1.7 l net.  Glucose well controlled on lantus 25 U BID, with humalog 8 U WM plus SSI. BP better controlled around 150-160 this morning on 10 mg amlodipine, hydral 100 mg TID, imdur 90 mg BID, carvedilol 25 mg BID, and torsemide 20 mg daily. Of note, potassium is 3.4 this morning and will replace with 40 meq potassium prior to discharge. Patient will be discharged home to self care with family as she cannot drive until cleared by neurology.    Discharge Diagnoses:  Active Problems:   Accelerated hypertension   Diabetes mellitus, type 2 (HCC)   Acute on chronic renal failure (HCC)   Hyponatremia   Hypertensive emergency   Chronic diastolic heart failure ([place])   Status epilepticus ([place])   Hyperglycemic hyperosmolar nonketotic coma ([place])    Discharge Instructions   Allergies as of 01/23/2020   No Known Allergies  Medication List    TAKE these medications   amLODipine 10 MG tablet Commonly known as: NORVASC Take 1 tablet (10 mg total) by mouth daily.   aspirin 81 MG chewable tablet Chew 1 tablet (81 mg total) by mouth daily. Start taking on: January 24, 2020   calcitRIOL 0.25 MCG capsule Commonly known as: ROCALTROL Take 0.25 mcg  by mouth See admin instructions. MON WED SAT Notes to patient: Take as directed   carvedilol 25 MG tablet Commonly known as: COREG Take 1 tablet (25 mg total) by mouth 2 (two) times daily with a meal.   cloNIDine 0.2 MG tablet Commonly known as: CATAPRES Take 1 tablet (0.2 mg total) by mouth 3 (three) times daily.   fluticasone 50 MCG/ACT nasal spray Commonly known as: Flonase Place 1 spray into both nostrils daily. What changed:   when to take this  reasons to take this   hydrALAZINE 50 MG tablet Commonly known as: APRESOLINE Take 2 tablets (100 mg total) by mouth 3 (three) times daily. What changed: how much to take   insulin aspart 100 UNIT/ML injection Commonly known as: novoLOG Inject 0-9 Units into the skin every 4 (four) hours.   insulin aspart 100 UNIT/ML injection Commonly known as: novoLOG Inject 8 Units into the skin 3 (three) times daily with meals.   insulin glargine 100 UNIT/ML injection Commonly known as: LANTUS Inject 0.25 mLs (25 Units total) into the skin 2 (two) times daily.   isosorbide mononitrate 30 MG 24 hr tablet Commonly known as: IMDUR Take 3 tablets (90 mg total) by mouth 2 (two) times daily. What changed:   how much to take  when to take this   levETIRAcetam 500 MG tablet Commonly known as: KEPPRA Take 1 tablet (500 mg total) by mouth 2 (two) times daily.   pravastatin 20 MG tablet Commonly known as: PRAVACHOL Take 1 tablet (20 mg total) by mouth daily at 6 PM.   sodium bicarbonate 650 MG tablet Take 1 tablet (650 mg total) by mouth 3 (three) times daily.   torsemide 20 MG tablet Commonly known as: DEMADEX Take 2 tablets (40 mg total) by mouth daily.      Follow-up Information    Minda Ditto, MD Follow up in 1 week(s).   Specialty: Family Medicine Contact information: [employer] [place] [address] [phone]        Minna Merritts, MD .   Specialty: Cardiology Contact  information: [address] [place] [address] [phone]        [facility]. Call in 2 week(s).   Specialty: Neurology Contact information: [address] [place] [phone]         No Known Allergies  Consultations:  PCCM, neurology   Procedures/Studies: EEG  Result Date: 01/19/2020 Lora Havens, MD     01/19/2020 12:39 PM Patient Name: Jahmiah Peek MRN: [id number] Epilepsy Attending: Lora Havens Referring Physician/Provider: Dr. Marcelle Overlie Date: 1/90/2021 Duration: 23.32 minutes Patient history: [age]-year-old female with history of hypertension and CKD who presented with status epilepticus, hypertensive urgency and acute respiratory EEG 12 for seizures. Level of alertness: Awake, asleep AEDs during EEG study: Keppra Technical aspects: This EEG study was done with scalp electrodes positioned according to the 10-20 International system of electrode placement. Electrical activity was acquired at a sampling rate of 500Hz  and reviewed with a high frequency filter of 70Hz  and a low frequency filter  of 1Hz . EEG data were recorded continuously and digitally stored. Description: During awake state, no clear posterior dominant was seen.  Sleep was characterized by sleep spindles (12 to 14 Hz), maximal frontocentral.  EEG showed polymorphic generalized 8 to 9 Hz alpha activity.  Hyperventilation photic summation were not performed. IMPRESSION: This study is within normal limits. No seizures or epileptiform discharges were seen throughout the recording. Lora Havens   MR BRAIN WO CONTRAST  Result Date: 01/19/2020 CLINICAL DATA:  Malignant hypertension.  Seizure. EXAM: MRI HEAD WITHOUT CONTRAST TECHNIQUE: Multiplanar, multiecho pulse sequences of the brain and surrounding structures were obtained without intravenous contrast. COMPARISON:  None. FINDINGS: Brain: Ventricle size and cerebral volume. Negative  for acute or chronic infarct. No cerebral edema. No evidence of PRES. Negative for hemorrhage or mass. No fluid collection. 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 Vascular: Normal arterial flow voids Skull and upper cervical spine: No focal skeletal lesion. Sinuses/Orbits: Negative Other: None IMPRESSION: Image quality degraded by mild motion. Negative MRI head.  No acute infarct.  Negative for PRES Electronically Signed   By: Franchot Gallo M.D.   On: 01/19/2020 19:49   DG Chest Port 1 View  Result Date: 01/20/2020 CLINICAL DATA:  Seizure activity, possible aspiration EXAM: PORTABLE CHEST 1 VIEW COMPARISON:  01/19/2020 FINDINGS: Cardiac shadow remains prominent. The lungs are well aerated bilaterally. Minimal right basilar atelectasis is seen. No other focal abnormality is noted. IMPRESSION: Mild right basilar atelectasis. Electronically Signed   By: Inez Catalina M.D.   On: 01/20/2020 08:17   DG Chest Portable 1 View  Result Date: 01/19/2020 CLINICAL DATA:  Slurred speech. EXAM: PORTABLE CHEST 1 VIEW COMPARISON:  08/26/2019 FINDINGS: Cardiomediastinal contours remain enlarged. Linear opacities noted at the left lung base. Lungs are otherwise clear without definite signs of effusion. Numerous leads project over the chest. Visualized skeletal structures are unremarkable. IMPRESSION: Linear opacity at the left lung base may represent subsegmental atelectasis. Cardiomegaly as before. Electronically Signed   By: Zetta Bills M.D.   On: 01/19/2020 08:41   CT HEAD CODE STROKE WO CONTRAST`  Result Date: 01/18/2020 CLINICAL DATA:  Code stroke.  Sudden onset slurred speech. EXAM: CT HEAD WITHOUT CONTRAST TECHNIQUE: Contiguous axial images were obtained from the base of the skull through the vertex without intravenous contrast. COMPARISON:  None. FINDINGS: Brain: There is no mass, hemorrhage or extra-axial collection. The size and configuration of the ventricles and extra-axial CSF  spaces are normal. The brain parenchyma is normal, without evidence of acute or chronic infarction. Vascular: No abnormal hyperdensity of the major intracranial arteries or dural venous sinuses. No intracranial atherosclerosis. Skull: The visualized skull base, calvarium and extracranial soft tissues are normal. Sinuses/Orbits: No fluid levels or advanced mucosal thickening of the visualized paranasal sinuses. No mastoid or middle ear effusion. The orbits are normal. ASPECTS [facility] Stroke Program Early CT Score) - Ganglionic level infarction (caudate, lentiform nuclei, internal capsule, insula, M1-M3 cortex): 7 - Supraganglionic infarction (M4-M6 cortex): 3 Total score (0-10 with 10 being normal): 10 IMPRESSION: 1. No acute abnormality 2. ASPECTS is 10. 3. These results were called by telephone at the time of interpretation on 01/18/2020 at 10:07 pm to provider [facility] , who verbally acknowledged these results. Electronically Signed   By: Ulyses Jarred M.D.   On: 01/18/2020 22:07       Subjective: Patient in no distress this morning. She has no complaints and is ready to be discharged home. She  says she is tired of being in the bed.    Discharge Exam: Vitals:   01/23/20 0849 01/23/20 1305  BP: (!) 165/85 (!) 154/76  Pulse: 69 65  Resp: 19 20  Temp: 97.8 F (36.6 C)   SpO2: 98% 96%   Vitals:   01/23/20 0001 01/23/20 0353 01/23/20 0849 01/23/20 1305  BP: (!) 158/72 (!) 174/88 (!) 165/85 (!) 154/76  Pulse: 73 70 69 65  Resp: 16 20 19 20   Temp: 98.3 F (36.8 C) 98 F (36.7 C) 97.8 F (36.6 C)   TempSrc: Oral Oral Oral   SpO2: 100% 98% 98% 96%  Weight:  110.6 kg    Height:        General: Pt is alert, awake, not in acute distress Cardiovascular: RRR, S1/S2 +, no rubs, no gallops Respiratory: CTA bilaterally, no wheezing, no rhonchi Abdominal: Soft, NT, ND, bowel sounds + Extremities: no edema, no cyanosis    The results of significant diagnostics from this  hospitalization (including imaging, microbiology, ancillary and laboratory) are listed below for reference.     Microbiology: Recent Results (from the past 240 hour(s))  Respiratory Panel by RT PCR (Flu A&B, Covid) - Nasopharyngeal Swab     Status: None   Collection Time: 01/18/20 10:31 PM   Specimen: Nasopharyngeal Swab  Result Value Ref Range Status   SARS Coronavirus 2 by RT PCR NEGATIVE NEGATIVE Final    Comment: (NOTE) SARS-CoV-2 target nucleic acids are NOT DETECTED. The SARS-CoV-2 RNA is generally detectable in upper respiratoy specimens during the acute phase of infection. The lowest concentration of SARS-CoV-2 viral copies this assay can detect is 131 copies/mL. A negative result does not preclude SARS-Cov-2 infection and should not be used as the sole basis for treatment or other patient management decisions. A negative result may occur with  improper specimen collection/handling, submission of specimen other than nasopharyngeal swab, presence of viral mutation(s) within the areas targeted by this assay, and inadequate number of viral copies (<131 copies/mL). A negative result must be combined with clinical observations, patient history, and epidemiological information. The expected result is Negative. Fact Sheet for Patients:  PinkCheek.be Fact Sheet for Healthcare Providers:  GravelBags.it This test is not yet ap proved or cleared by the [place] FDA and  has been authorized for detection and/or diagnosis of SARS-CoV-2 by FDA under an Emergency Use Authorization (EUA). This EUA will remain  in effect (meaning this test can be used) for the duration of the COVID-19 declaration under Section 564(b)(1) of the Act, 21 U.S.C. section 360bbb-3(b)(1), unless the authorization is terminated or revoked sooner.    Influenza A by PCR NEGATIVE NEGATIVE Final   Influenza B by PCR NEGATIVE NEGATIVE Final    Comment:  (NOTE) The Xpert Xpress SARS-CoV-2/FLU/RSV assay is intended as an aid in  the diagnosis of influenza from Nasopharyngeal swab specimens and  should not be used as a sole basis for treatment. Nasal washings and  aspirates are unacceptable for Xpert Xpress SARS-CoV-2/FLU/RSV  testing. Fact Sheet for Patients: PinkCheek.be Fact Sheet for Healthcare Providers: GravelBags.it This test is not yet approved or cleared by the [place] FDA and  has been authorized for detection and/or diagnosis of SARS-CoV-2 by  FDA under an Emergency Use Authorization (EUA). This EUA will remain  in effect (meaning this test can be used) for the duration of the  Covid-19 declaration under Section 564(b)(1) of the Act, 21  U.S.C. section 360bbb-3(b)(1), unless the authorization is  terminated or  revoked. Performed at [facility], [address] [place], [place] [address]   MRSA PCR Screening     Status: None   Collection Time: 01/19/20 10:07 AM   Specimen: Nasal Mucosa; Nasopharyngeal  Result Value Ref Range Status   MRSA by PCR NEGATIVE NEGATIVE Final    Comment:        The GeneXpert MRSA Assay (FDA approved for NASAL specimens only), is one component of a comprehensive MRSA colonization surveillance program. It is not intended to diagnose MRSA infection nor to guide or monitor treatment for MRSA infections. Performed at [facility], [place] [address], [place], [place] [address]      Labs: BNP (last 3 results) Recent Labs    08/26/19 0451 01/20/20 0255  BNP 1,589.0* 123456*   Basic Metabolic Panel: Recent Labs  Lab 01/19/20 0801 01/19/20 1056 01/19/20 2234 01/20/20 0255 01/21/20 0213 01/22/20 0302 01/23/20 0249  NA 128*   < > 135 131* 135 132* 138  K 3.0*   < > 3.1* 3.3* 3.1* 3.5 3.4*  CL 96*   < > 108 104 107 106 110  CO2 19*   < > 18* 18* 16* 15* 16*  GLUCOSE 590*   < > 105* 206* 245* 240* 130*  BUN  54*   < > 49* 48* 52* 58* 54*  CREATININE 4.44*   < > 4.35* 4.49* 4.77* 4.98* 4.90*  CALCIUM 8.8*   < > 8.7* 8.4* 8.5* 8.3* 8.8*  MG 2.2  --   --  1.9 2.2  --   --   PHOS  --   --   --  3.5 3.9  --   --    < > = values in this interval not displayed.   Liver Function Tests: Recent Labs  Lab 01/18/20 2151  AST 16  ALT 17  ALKPHOS 182*  BILITOT 0.8  PROT 7.6  ALBUMIN 3.1*   No results for input(s): LIPASE, AMYLASE in the last 168 hours. No results for input(s): AMMONIA in the last 168 hours. CBC: Recent Labs  Lab 01/18/20 2151 01/20/20 0255 01/21/20 0213 01/22/20 0302 01/23/20 0249  WBC 7.0 11.6* 8.1 8.9 9.5  NEUTROABS 5.3  --   --   --   --   HGB 12.0 11.1* 10.6* 10.1* 10.5*  HCT 36.0 31.9* 31.5* 30.3* 31.6*  MCV 75.8* 74.2* 75.7* 76.3* 76.5*  PLT 270 331 283 295 296   Cardiac Enzymes: No results for input(s): CKTOTAL, CKMB, CKMBINDEX, TROPONINI in the last 168 hours. BNP: Invalid input(s): POCBNP CBG: Recent Labs  Lab 01/22/20 2008 01/23/20 0003 01/23/20 0411 01/23/20 0810 01/23/20 1207  GLUCAP 195* 149* 129* 164* 130*   D-Dimer No results for input(s): DDIMER in the last 72 hours. Hgb A1c No results for input(s): HGBA1C in the last 72 hours. Lipid Profile No results for input(s): CHOL, HDL, LDLCALC, TRIG, CHOLHDL, LDLDIRECT in the last 72 hours. Thyroid function studies No results for input(s): TSH, T4TOTAL, T3FREE, THYROIDAB in the last 72 hours.  Invalid input(s): FREET3 Anemia work up No results for input(s): VITAMINB12, FOLATE, FERRITIN, TIBC, IRON, RETICCTPCT in the last 72 hours. Urinalysis    Component Value Date/Time   COLORURINE YELLOW (A) 01/19/2020 0716   APPEARANCEUR HAZY (A) 01/19/2020 0716   LABSPEC 1.015 01/19/2020 0716   PHURINE 5.0 01/19/2020 0716   GLUCOSEU >=500 (A) 01/19/2020 0716   HGBUR NEGATIVE 01/19/2020 0716   BILIRUBINUR NEGATIVE 01/19/2020 0716   KETONESUR NEGATIVE 01/19/2020 0716  PROTEINUR 100 (A) 01/19/2020 0716    NITRITE NEGATIVE 01/19/2020 0716   LEUKOCYTESUR NEGATIVE 01/19/2020 0716   Sepsis Labs Invalid input(s): PROCALCITONIN,  WBC,  LACTICIDVEN Microbiology Recent Results (from the past 240 hour(s))  Respiratory Panel by RT PCR (Flu A&B, Covid) - Nasopharyngeal Swab     Status: None   Collection Time: 01/18/20 10:31 PM   Specimen: Nasopharyngeal Swab  Result Value Ref Range Status   SARS Coronavirus 2 by RT PCR NEGATIVE NEGATIVE Final    Comment: (NOTE) SARS-CoV-2 target nucleic acids are NOT DETECTED. The SARS-CoV-2 RNA is generally detectable in upper respiratoy specimens during the acute phase of infection. The lowest concentration of SARS-CoV-2 viral copies this assay can detect is 131 copies/mL. A negative result does not preclude SARS-Cov-2 infection and should not be used as the sole basis for treatment or other patient management decisions. A negative result may occur with  improper specimen collection/handling, submission of specimen other than nasopharyngeal swab, presence of viral mutation(s) within the areas targeted by this assay, and inadequate number of viral copies (<131 copies/mL). A negative result must be combined with clinical observations, patient history, and epidemiological information. The expected result is Negative. Fact Sheet for Patients:  PinkCheek.be Fact Sheet for Healthcare Providers:  GravelBags.it This test is not yet ap proved or cleared by the [place] FDA and  has been authorized for detection and/or diagnosis of SARS-CoV-2 by FDA under an Emergency Use Authorization (EUA). This EUA will remain  in effect (meaning this test can be used) for the duration of the COVID-19 declaration under Section 564(b)(1) of the Act, 21 U.S.C. section 360bbb-3(b)(1), unless the authorization is terminated or revoked sooner.    Influenza A by PCR NEGATIVE NEGATIVE Final   Influenza B by PCR NEGATIVE  NEGATIVE Final    Comment: (NOTE) The Xpert Xpress SARS-CoV-2/FLU/RSV assay is intended as an aid in  the diagnosis of influenza from Nasopharyngeal swab specimens and  should not be used as a sole basis for treatment. Nasal washings and  aspirates are unacceptable for Xpert Xpress SARS-CoV-2/FLU/RSV  testing. Fact Sheet for Patients: PinkCheek.be Fact Sheet for Healthcare Providers: GravelBags.it This test is not yet approved or cleared by the [place] FDA and  has been authorized for detection and/or diagnosis of SARS-CoV-2 by  FDA under an Emergency Use Authorization (EUA). This EUA will remain  in effect (meaning this test can be used) for the duration of the  Covid-19 declaration under Section 564(b)(1) of the Act, 21  U.S.C. section 360bbb-3(b)(1), unless the authorization is  terminated or revoked. Performed at [facility], [address] [place], [place] [address]   MRSA PCR Screening     Status: None   Collection Time: 01/19/20 10:07 AM   Specimen: Nasal Mucosa; Nasopharyngeal  Result Value Ref Range Status   MRSA by PCR NEGATIVE NEGATIVE Final    Comment:        The GeneXpert MRSA Assay (FDA approved for NASAL specimens only), is one component of a comprehensive MRSA colonization surveillance program. It is not intended to diagnose MRSA infection nor to guide or monitor treatment for MRSA infections. Performed at [facility], [place] [address] [place], [place] [address]      Time coordinating discharge: Over 30 minutes  SIGNED:   Arlan Organ, DO  [facility] 01/23/2020, 1:19 PM

## 2020-01-29 DIAGNOSIS — E1122 Type 2 diabetes mellitus with diabetic chronic kidney disease: Secondary | ICD-10-CM | POA: Diagnosis not present

## 2020-01-29 DIAGNOSIS — Z794 Long term (current) use of insulin: Secondary | ICD-10-CM | POA: Diagnosis not present

## 2020-01-29 DIAGNOSIS — I130 Hypertensive heart and chronic kidney disease with heart failure and stage 1 through stage 4 chronic kidney disease, or unspecified chronic kidney disease: Secondary | ICD-10-CM | POA: Diagnosis not present

## 2020-01-29 DIAGNOSIS — N184 Chronic kidney disease, stage 4 (severe): Secondary | ICD-10-CM | POA: Diagnosis not present

## 2020-01-29 DIAGNOSIS — I5032 Chronic diastolic (congestive) heart failure: Secondary | ICD-10-CM | POA: Diagnosis not present

## 2020-01-29 DIAGNOSIS — Z6841 Body Mass Index (BMI) 40.0 and over, adult: Secondary | ICD-10-CM | POA: Diagnosis not present
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
## 2020-10-20 ENCOUNTER — Other Ambulatory Visit: Payer: Self-pay

## 2020-10-20 ENCOUNTER — Emergency Department: Payer: Medicare Other

## 2020-10-20 DIAGNOSIS — N390 Urinary tract infection, site not specified: Secondary | ICD-10-CM | POA: Diagnosis not present

## 2020-10-20 DIAGNOSIS — E559 Vitamin D deficiency, unspecified: Secondary | ICD-10-CM | POA: Diagnosis present

## 2020-10-20 DIAGNOSIS — N2581 Secondary hyperparathyroidism of renal origin: Secondary | ICD-10-CM | POA: Diagnosis present

## 2020-10-20 DIAGNOSIS — Z794 Long term (current) use of insulin: Secondary | ICD-10-CM

## 2020-10-20 DIAGNOSIS — R0789 Other chest pain: Secondary | ICD-10-CM | POA: Diagnosis not present

## 2020-10-20 DIAGNOSIS — E1122 Type 2 diabetes mellitus with diabetic chronic kidney disease: Secondary | ICD-10-CM | POA: Diagnosis present

## 2020-10-20 DIAGNOSIS — Z8249 Family history of ischemic heart disease and other diseases of the circulatory system: Secondary | ICD-10-CM

## 2020-10-20 DIAGNOSIS — Z992 Dependence on renal dialysis: Secondary | ICD-10-CM

## 2020-10-20 DIAGNOSIS — Z6841 Body Mass Index (BMI) 40.0 and over, adult: Secondary | ICD-10-CM

## 2020-10-20 DIAGNOSIS — T447X6A Underdosing of beta-adrenoreceptor antagonists, initial encounter: Secondary | ICD-10-CM | POA: Diagnosis present

## 2020-10-20 DIAGNOSIS — N184 Chronic kidney disease, stage 4 (severe): Secondary | ICD-10-CM | POA: Diagnosis not present

## 2020-10-20 DIAGNOSIS — N179 Acute kidney failure, unspecified: Secondary | ICD-10-CM | POA: Diagnosis present

## 2020-10-20 DIAGNOSIS — I5033 Acute on chronic diastolic (congestive) heart failure: Secondary | ICD-10-CM | POA: Diagnosis not present

## 2020-10-20 DIAGNOSIS — I129 Hypertensive chronic kidney disease with stage 1 through stage 4 chronic kidney disease, or unspecified chronic kidney disease: Secondary | ICD-10-CM | POA: Diagnosis not present

## 2020-10-20 DIAGNOSIS — I10 Essential (primary) hypertension: Secondary | ICD-10-CM | POA: Diagnosis not present

## 2020-10-20 DIAGNOSIS — I160 Hypertensive urgency: Secondary | ICD-10-CM | POA: Diagnosis not present

## 2020-10-20 DIAGNOSIS — E872 Acidosis: Secondary | ICD-10-CM | POA: Diagnosis not present

## 2020-10-20 DIAGNOSIS — Z79899 Other long term (current) drug therapy: Secondary | ICD-10-CM

## 2020-10-20 DIAGNOSIS — R457 State of emotional shock and stress, unspecified: Secondary | ICD-10-CM | POA: Diagnosis not present

## 2020-10-20 DIAGNOSIS — I132 Hypertensive heart and chronic kidney disease with heart failure and with stage 5 chronic kidney disease, or end stage renal disease: Principal | ICD-10-CM | POA: Diagnosis present

## 2020-10-20 DIAGNOSIS — D631 Anemia in chronic kidney disease: Secondary | ICD-10-CM | POA: Diagnosis present

## 2020-10-20 DIAGNOSIS — T465X6A Underdosing of other antihypertensive drugs, initial encounter: Secondary | ICD-10-CM | POA: Diagnosis present

## 2020-10-20 DIAGNOSIS — D638 Anemia in other chronic diseases classified elsewhere: Secondary | ICD-10-CM | POA: Diagnosis not present

## 2020-10-20 DIAGNOSIS — J90 Pleural effusion, not elsewhere classified: Secondary | ICD-10-CM | POA: Diagnosis not present

## 2020-10-20 DIAGNOSIS — I517 Cardiomegaly: Secondary | ICD-10-CM | POA: Diagnosis not present

## 2020-10-20 DIAGNOSIS — N186 End stage renal disease: Secondary | ICD-10-CM | POA: Diagnosis present

## 2020-10-20 DIAGNOSIS — E876 Hypokalemia: Secondary | ICD-10-CM | POA: Diagnosis present

## 2020-10-20 DIAGNOSIS — E785 Hyperlipidemia, unspecified: Secondary | ICD-10-CM | POA: Diagnosis present

## 2020-10-20 DIAGNOSIS — R000 Tachycardia, unspecified: Secondary | ICD-10-CM | POA: Diagnosis not present

## 2020-10-20 DIAGNOSIS — R079 Chest pain, unspecified: Secondary | ICD-10-CM | POA: Diagnosis not present

## 2020-10-20 DIAGNOSIS — Z20822 Contact with and (suspected) exposure to covid-19: Secondary | ICD-10-CM | POA: Diagnosis present

## 2020-10-20 DIAGNOSIS — Z7982 Long term (current) use of aspirin: Secondary | ICD-10-CM

## 2020-10-20 DIAGNOSIS — Z9114 Patient's other noncompliance with medication regimen: Secondary | ICD-10-CM

## 2020-10-20 DIAGNOSIS — E11649 Type 2 diabetes mellitus with hypoglycemia without coma: Secondary | ICD-10-CM | POA: Diagnosis not present

## 2020-10-20 DIAGNOSIS — I161 Hypertensive emergency: Secondary | ICD-10-CM | POA: Diagnosis not present

## 2020-10-20 DIAGNOSIS — I248 Other forms of acute ischemic heart disease: Secondary | ICD-10-CM | POA: Diagnosis present

## 2020-10-20 DIAGNOSIS — T461X6A Underdosing of calcium-channel blockers, initial encounter: Secondary | ICD-10-CM | POA: Diagnosis present

## 2020-10-20 DIAGNOSIS — Z91128 Patient's intentional underdosing of medication regimen for other reason: Secondary | ICD-10-CM

## 2020-10-20 LAB — BASIC METABOLIC PANEL
Anion gap: 21 — ABNORMAL HIGH (ref 5–15)
BUN: 146 mg/dL — ABNORMAL HIGH (ref 6–20)
CO2: 12 mmol/L — ABNORMAL LOW (ref 22–32)
Calcium: 6.9 mg/dL — ABNORMAL LOW (ref 8.9–10.3)
Chloride: 102 mmol/L (ref 98–111)
Creatinine, Ser: 13.49 mg/dL — ABNORMAL HIGH (ref 0.44–1.00)
GFR, Estimated: 3 mL/min — ABNORMAL LOW (ref >60)
Glucose, Bld: 171 mg/dL — ABNORMAL HIGH (ref 70–99)
Potassium: 3.4 mmol/L — ABNORMAL LOW (ref 3.5–5.1)
Sodium: 135 mmol/L (ref 135–145)

## 2020-10-20 LAB — TROPONIN I (HIGH SENSITIVITY): Troponin I (High Sensitivity): 59 ng/L — ABNORMAL HIGH (ref <18)

## 2020-10-20 LAB — CBC
HCT: 26.1 % — ABNORMAL LOW (ref 36.0–46.0)
Hemoglobin: 8 g/dL — ABNORMAL LOW (ref 12.0–15.0)
MCH: 23.1 pg — ABNORMAL LOW (ref 26.0–34.0)
MCHC: 30.7 g/dL (ref 30.0–36.0)
MCV: 75.2 fL — ABNORMAL LOW (ref 80.0–100.0)
Platelets: 348 10*3/uL (ref 150–400)
RBC: 3.47 MIL/uL — ABNORMAL LOW (ref 3.87–5.11)
RDW: 17.4 % — ABNORMAL HIGH (ref 11.5–15.5)
WBC: 11.3 10*3/uL — ABNORMAL HIGH (ref 4.0–10.5)
nRBC: 0 % (ref 0.0–0.2)

## 2020-10-20 MED ORDER — CLONIDINE HCL 0.1 MG PO TABS
0.2000 mg | ORAL_TABLET | Freq: Once | ORAL | Status: AC
  Administered 2020-10-20: 0.2 mg via ORAL
  Filled 2020-10-20: qty 2

## 2020-10-20 MED ORDER — CARVEDILOL 6.25 MG PO TABS: 12.5000 mg | ORAL_TABLET | Freq: Two times a day (BID) | ORAL | Status: DC

## 2020-10-20 MED ORDER — AMLODIPINE BESYLATE 5 MG PO TABS
10.0000 mg | ORAL_TABLET | Freq: Once | ORAL | Status: AC
  Administered 2020-10-20: 10 mg via ORAL
  Filled 2020-10-20: qty 2

## 2020-10-20 NOTE — ED Notes (Signed)
cbg PTA 171

## 2020-10-20 NOTE — ED Triage Notes (Signed)
EMS brings pt in from home for HTN, anxiety and midsternal CP, left knee pain x wk, swelling to LE(s); out of meds for few days; hx CHF

## 2020-10-20 NOTE — ED Triage Notes (Signed)
PT to ED via EMS c/o HTN (pt has been out of meds for about 1 month), intermittent CP, L knee pain (front of knee, no injury or redness ) , back pain, and shakiness as well as abnormal BM (small irregular BMs). PT is alert and oriented. Dyspnea on exertion.

## 2020-10-20 NOTE — ED Provider Notes (Signed)
I was asked by nursing to evaluate patient's blood pressure.  Patient has a documented history of severe hypertension, and is on multiple agents for this.  She has not been taking these.  Given the severe degree of her hypertension, will give her her home clonidine, a half dose of Coreg given that she has not been taking it, and her amlodipine.  Will defer further treatment until labs and imaging is back.   Duffy Bruce, MD 10/20/20 [id number]

## 2020-10-21 ENCOUNTER — Inpatient Hospital Stay: Payer: Medicare Other

## 2020-10-21 ENCOUNTER — Inpatient Hospital Stay
Admission: EM | Admit: 2020-10-21 | Discharge: 2020-10-27 | DRG: 291 | Disposition: A | Discharge status: Home / Self Care | Payer: Medicare Other | Attending: Internal Medicine | Admitting: Internal Medicine

## 2020-10-21 DIAGNOSIS — R778 Other specified abnormalities of plasma proteins: Secondary | ICD-10-CM | POA: Diagnosis not present

## 2020-10-21 DIAGNOSIS — E876 Hypokalemia: Secondary | ICD-10-CM | POA: Diagnosis not present

## 2020-10-21 DIAGNOSIS — N049 Nephrotic syndrome with unspecified morphologic changes: Secondary | ICD-10-CM | POA: Diagnosis not present

## 2020-10-21 DIAGNOSIS — J810 Acute pulmonary edema: Secondary | ICD-10-CM | POA: Diagnosis not present

## 2020-10-21 DIAGNOSIS — I248 Other forms of acute ischemic heart disease: Secondary | ICD-10-CM | POA: Diagnosis present

## 2020-10-21 DIAGNOSIS — T447X6A Underdosing of beta-adrenoreceptor antagonists, initial encounter: Secondary | ICD-10-CM | POA: Diagnosis present

## 2020-10-21 DIAGNOSIS — E1122 Type 2 diabetes mellitus with diabetic chronic kidney disease: Secondary | ICD-10-CM | POA: Diagnosis present

## 2020-10-21 DIAGNOSIS — E872 Acidosis: Secondary | ICD-10-CM | POA: Diagnosis not present

## 2020-10-21 DIAGNOSIS — I5032 Chronic diastolic (congestive) heart failure: Secondary | ICD-10-CM | POA: Diagnosis not present

## 2020-10-21 DIAGNOSIS — D631 Anemia in chronic kidney disease: Secondary | ICD-10-CM | POA: Diagnosis present

## 2020-10-21 DIAGNOSIS — Z794 Long term (current) use of insulin: Secondary | ICD-10-CM | POA: Diagnosis not present

## 2020-10-21 DIAGNOSIS — E1129 Type 2 diabetes mellitus with other diabetic kidney complication: Secondary | ICD-10-CM | POA: Diagnosis present

## 2020-10-21 DIAGNOSIS — E785 Hyperlipidemia, unspecified: Secondary | ICD-10-CM | POA: Diagnosis present

## 2020-10-21 DIAGNOSIS — I5031 Acute diastolic (congestive) heart failure: Secondary | ICD-10-CM | POA: Diagnosis not present

## 2020-10-21 DIAGNOSIS — R079 Chest pain, unspecified: Secondary | ICD-10-CM | POA: Diagnosis not present

## 2020-10-21 DIAGNOSIS — N179 Acute kidney failure, unspecified: Secondary | ICD-10-CM

## 2020-10-21 DIAGNOSIS — Z992 Dependence on renal dialysis: Secondary | ICD-10-CM | POA: Diagnosis present

## 2020-10-21 DIAGNOSIS — I10 Essential (primary) hypertension: Secondary | ICD-10-CM | POA: Diagnosis present

## 2020-10-21 DIAGNOSIS — I509 Heart failure, unspecified: Secondary | ICD-10-CM | POA: Insufficient documentation

## 2020-10-21 DIAGNOSIS — R7989 Other specified abnormal findings of blood chemistry: Secondary | ICD-10-CM

## 2020-10-21 DIAGNOSIS — N185 Chronic kidney disease, stage 5: Secondary | ICD-10-CM | POA: Diagnosis not present

## 2020-10-21 DIAGNOSIS — Z9114 Patient's other noncompliance with medication regimen: Secondary | ICD-10-CM | POA: Diagnosis not present

## 2020-10-21 DIAGNOSIS — N186 End stage renal disease: Secondary | ICD-10-CM

## 2020-10-21 DIAGNOSIS — N170 Acute kidney failure with tubular necrosis: Secondary | ICD-10-CM

## 2020-10-21 DIAGNOSIS — T465X6A Underdosing of other antihypertensive drugs, initial encounter: Secondary | ICD-10-CM | POA: Diagnosis present

## 2020-10-21 DIAGNOSIS — N390 Urinary tract infection, site not specified: Secondary | ICD-10-CM

## 2020-10-21 DIAGNOSIS — I5033 Acute on chronic diastolic (congestive) heart failure: Secondary | ICD-10-CM | POA: Diagnosis present

## 2020-10-21 DIAGNOSIS — I161 Hypertensive emergency: Secondary | ICD-10-CM

## 2020-10-21 DIAGNOSIS — D649 Anemia, unspecified: Secondary | ICD-10-CM | POA: Diagnosis not present

## 2020-10-21 DIAGNOSIS — N184 Chronic kidney disease, stage 4 (severe): Secondary | ICD-10-CM

## 2020-10-21 DIAGNOSIS — I132 Hypertensive heart and chronic kidney disease with heart failure and with stage 5 chronic kidney disease, or end stage renal disease: Secondary | ICD-10-CM | POA: Diagnosis present

## 2020-10-21 DIAGNOSIS — E559 Vitamin D deficiency, unspecified: Secondary | ICD-10-CM | POA: Diagnosis present

## 2020-10-21 DIAGNOSIS — N2581 Secondary hyperparathyroidism of renal origin: Secondary | ICD-10-CM | POA: Diagnosis present

## 2020-10-21 DIAGNOSIS — Z6841 Body Mass Index (BMI) 40.0 and over, adult: Secondary | ICD-10-CM | POA: Diagnosis not present

## 2020-10-21 DIAGNOSIS — Z20822 Contact with and (suspected) exposure to covid-19: Secondary | ICD-10-CM | POA: Diagnosis present

## 2020-10-21 DIAGNOSIS — E11649 Type 2 diabetes mellitus with hypoglycemia without coma: Secondary | ICD-10-CM | POA: Diagnosis not present

## 2020-10-21 LAB — HIV ANTIBODY (ROUTINE TESTING W REFLEX): HIV Screen 4th Generation wRfx: NONREACTIVE

## 2020-10-21 LAB — URINALYSIS, ROUTINE W REFLEX MICROSCOPIC
Bilirubin Urine: NEGATIVE
Glucose, UA: NEGATIVE mg/dL
Ketones, ur: NEGATIVE mg/dL
Nitrite: POSITIVE — AB
Protein, ur: 100 mg/dL — AB
Specific Gravity, Urine: 1.011 (ref 1.005–1.030)
pH: 5 (ref 5.0–8.0)

## 2020-10-21 LAB — GLUCOSE, CAPILLARY
Glucose-Capillary: 112 mg/dL — ABNORMAL HIGH (ref 70–99)
Glucose-Capillary: 121 mg/dL — ABNORMAL HIGH (ref 70–99)

## 2020-10-21 LAB — RESPIRATORY PANEL BY RT PCR (FLU A&B, COVID)
Influenza A by PCR: NEGATIVE
Influenza B by PCR: NEGATIVE
SARS Coronavirus 2 by RT PCR: NEGATIVE

## 2020-10-21 LAB — BASIC METABOLIC PANEL
Anion gap: 20 — ABNORMAL HIGH (ref 5–15)
BUN: 153 mg/dL — ABNORMAL HIGH (ref 6–20)
CO2: 13 mmol/L — ABNORMAL LOW (ref 22–32)
Calcium: 6.9 mg/dL — ABNORMAL LOW (ref 8.9–10.3)
Chloride: 103 mmol/L (ref 98–111)
Creatinine, Ser: 13.63 mg/dL — ABNORMAL HIGH (ref 0.44–1.00)
GFR, Estimated: 3 mL/min — ABNORMAL LOW (ref >60)
Glucose, Bld: 138 mg/dL — ABNORMAL HIGH (ref 70–99)
Potassium: 3.3 mmol/L — ABNORMAL LOW (ref 3.5–5.1)
Sodium: 136 mmol/L (ref 135–145)

## 2020-10-21 LAB — TROPONIN I (HIGH SENSITIVITY)
Troponin I (High Sensitivity): 57 ng/L — ABNORMAL HIGH (ref <18)
Troponin I (High Sensitivity): 64 ng/L — ABNORMAL HIGH (ref <18)
Troponin I (High Sensitivity): 68 ng/L — ABNORMAL HIGH (ref <18)
Troponin I (High Sensitivity): 75 ng/L — ABNORMAL HIGH (ref <18)

## 2020-10-21 LAB — APTT: aPTT: 33 seconds (ref 24–36)

## 2020-10-21 LAB — PROTIME-INR
INR: 1.3 — ABNORMAL HIGH (ref 0.8–1.2)
Prothrombin Time: 15.3 seconds — ABNORMAL HIGH (ref 11.4–15.2)

## 2020-10-21 LAB — PHOSPHORUS: Phosphorus: 9.7 mg/dL — ABNORMAL HIGH (ref 2.5–4.6)

## 2020-10-21 LAB — MAGNESIUM: Magnesium: 2.6 mg/dL — ABNORMAL HIGH (ref 1.7–2.4)

## 2020-10-21 LAB — BETA-HYDROXYBUTYRIC ACID: Beta-Hydroxybutyric Acid: 0.28 mmol/L — ABNORMAL HIGH (ref 0.05–0.27)

## 2020-10-21 MED ORDER — PRAVASTATIN SODIUM 20 MG PO TABS
20.0000 mg | ORAL_TABLET | Freq: Every day | ORAL | Status: DC
  Administered 2020-10-21 – 2020-10-26 (×6): 20 mg via ORAL
  Filled 2020-10-21 (×6): qty 1

## 2020-10-21 MED ORDER — DM-GUAIFENESIN ER 30-600 MG PO TB12: 1.0000 | ORAL_TABLET | Freq: Two times a day (BID) | ORAL | Status: DC | PRN

## 2020-10-21 MED ORDER — FUROSEMIDE 10 MG/ML IJ SOLN
80.0000 mg | Freq: Once | INTRAMUSCULAR | Status: AC
  Administered 2020-10-21: 80 mg via INTRAVENOUS
  Filled 2020-10-21: qty 8

## 2020-10-21 MED ORDER — HYDRALAZINE HCL 20 MG/ML IJ SOLN
5.0000 mg | INTRAMUSCULAR | Status: DC | PRN
  Administered 2020-10-21 (×2): 5 mg via INTRAVENOUS
  Filled 2020-10-21 (×2): qty 1

## 2020-10-21 MED ORDER — CALCITRIOL 0.25 MCG PO CAPS
0.2500 ug | ORAL_CAPSULE | ORAL | Status: DC
  Administered 2020-10-22 – 2020-10-26 (×3): 0.25 ug via ORAL
  Filled 2020-10-21 (×4): qty 1

## 2020-10-21 MED ORDER — INSULIN GLARGINE 100 UNIT/ML ~~LOC~~ SOLN
13.0000 [IU] | Freq: Two times a day (BID) | SUBCUTANEOUS | Status: DC
  Administered 2020-10-21 – 2020-10-23 (×5): 13 [IU] via SUBCUTANEOUS
  Filled 2020-10-21 (×9): qty 0.13

## 2020-10-21 MED ORDER — CALCITRIOL 0.25 MCG PO CAPS: 0.2500 ug | ORAL_CAPSULE | ORAL | Status: DC

## 2020-10-21 MED ORDER — HYDRALAZINE HCL 50 MG PO TABS
50.0000 mg | ORAL_TABLET | Freq: Three times a day (TID) | ORAL | Status: DC
  Administered 2020-10-21 – 2020-10-27 (×18): 50 mg via ORAL
  Filled 2020-10-21 (×18): qty 1

## 2020-10-21 MED ORDER — CARVEDILOL 25 MG PO TABS
25.0000 mg | ORAL_TABLET | Freq: Two times a day (BID) | ORAL | Status: DC
  Administered 2020-10-21 – 2020-10-27 (×10): 25 mg via ORAL
  Filled 2020-10-21 (×10): qty 1

## 2020-10-21 MED ORDER — ONDANSETRON HCL 4 MG/2ML IJ SOLN: 4.0000 mg | Freq: Three times a day (TID) | INTRAMUSCULAR | Status: DC | PRN

## 2020-10-21 MED ORDER — SODIUM CHLORIDE 0.9 % IV SOLN
2.0000 g | INTRAVENOUS | Status: DC
  Administered 2020-10-22 – 2020-10-23 (×2): 2 g via INTRAVENOUS
  Filled 2020-10-21 (×2): qty 20

## 2020-10-21 MED ORDER — CLONIDINE HCL 0.1 MG PO TABS
0.2000 mg | ORAL_TABLET | Freq: Three times a day (TID) | ORAL | Status: DC
  Administered 2020-10-21 – 2020-10-27 (×18): 0.2 mg via ORAL
  Filled 2020-10-21 (×18): qty 2

## 2020-10-21 MED ORDER — ALBUTEROL SULFATE HFA 108 (90 BASE) MCG/ACT IN AERS
2.0000 | INHALATION_SPRAY | RESPIRATORY_TRACT | Status: DC | PRN
  Filled 2020-10-21: qty 6.7

## 2020-10-21 MED ORDER — SODIUM BICARBONATE 650 MG PO TABS
650.0000 mg | ORAL_TABLET | Freq: Three times a day (TID) | ORAL | Status: DC
  Administered 2020-10-21 – 2020-10-27 (×19): 650 mg via ORAL
  Filled 2020-10-21 (×20): qty 1

## 2020-10-21 MED ORDER — INSULIN ASPART 100 UNIT/ML ~~LOC~~ SOLN
0.0000 [IU] | Freq: Three times a day (TID) | SUBCUTANEOUS | Status: DC
  Administered 2020-10-26: 1 [IU] via SUBCUTANEOUS
  Administered 2020-10-27: 3 [IU] via SUBCUTANEOUS
  Filled 2020-10-21 (×3): qty 1

## 2020-10-21 MED ORDER — INSULIN ASPART 100 UNIT/ML ~~LOC~~ SOLN: 0.0000 [IU] | Freq: Every day | SUBCUTANEOUS | Status: DC

## 2020-10-21 MED ORDER — LABETALOL HCL 5 MG/ML IV SOLN
10.0000 mg | Freq: Once | INTRAVENOUS | Status: AC
  Administered 2020-10-21: 10 mg via INTRAVENOUS
  Filled 2020-10-21: qty 4

## 2020-10-21 MED ORDER — NITROGLYCERIN 2 % TD OINT
1.0000 [in_us] | TOPICAL_OINTMENT | Freq: Once | TRANSDERMAL | Status: AC
  Administered 2020-10-21: 1 [in_us] via TOPICAL
  Filled 2020-10-21: qty 1

## 2020-10-21 MED ORDER — AMLODIPINE BESYLATE 10 MG PO TABS
10.0000 mg | ORAL_TABLET | Freq: Every day | ORAL | Status: DC
  Administered 2020-10-22 – 2020-10-27 (×6): 10 mg via ORAL
  Filled 2020-10-21 (×6): qty 1

## 2020-10-21 MED ORDER — LEVETIRACETAM 500 MG PO TABS
500.0000 mg | ORAL_TABLET | Freq: Two times a day (BID) | ORAL | Status: DC
  Administered 2020-10-21 – 2020-10-27 (×13): 500 mg via ORAL
  Filled 2020-10-21 (×13): qty 1

## 2020-10-21 MED ORDER — ASPIRIN 81 MG PO CHEW
81.0000 mg | CHEWABLE_TABLET | Freq: Every day | ORAL | Status: DC
  Administered 2020-10-21 – 2020-10-27 (×7): 81 mg via ORAL
  Filled 2020-10-21 (×7): qty 1

## 2020-10-21 MED ORDER — SODIUM CHLORIDE 0.9 % IV SOLN
1.0000 g | INTRAVENOUS | Status: AC
  Administered 2020-10-21: 1 g via INTRAVENOUS
  Filled 2020-10-21: qty 10

## 2020-10-21 MED ORDER — HEPARIN SODIUM (PORCINE) 5000 UNIT/ML IJ SOLN
5000.0000 [IU] | Freq: Three times a day (TID) | INTRAMUSCULAR | Status: DC
  Administered 2020-10-21 – 2020-10-23 (×6): 5000 [IU] via SUBCUTANEOUS
  Filled 2020-10-21 (×6): qty 1

## 2020-10-21 MED ORDER — CALCIUM GLUCONATE-NACL 1-0.675 GM/50ML-% IV SOLN
1.0000 g | Freq: Once | INTRAVENOUS | Status: AC
  Administered 2020-10-21: 1000 mg via INTRAVENOUS
  Filled 2020-10-21: qty 50

## 2020-10-21 MED ORDER — ISOSORBIDE MONONITRATE ER 30 MG PO TB24
30.0000 mg | ORAL_TABLET | Freq: Every day | ORAL | Status: DC
  Administered 2020-10-21 – 2020-10-27 (×7): 30 mg via ORAL
  Filled 2020-10-21 (×7): qty 1

## 2020-10-21 MED ORDER — ACETAMINOPHEN 325 MG PO TABS
650.0000 mg | ORAL_TABLET | Freq: Four times a day (QID) | ORAL | Status: DC | PRN
  Administered 2020-10-22: 650 mg via ORAL
  Filled 2020-10-21: qty 2

## 2020-10-21 MED ORDER — FUROSEMIDE 10 MG/ML IJ SOLN
8.0000 mg/h | INTRAVENOUS | Status: DC
  Administered 2020-10-21 – 2020-10-24 (×3): 8 mg/h via INTRAVENOUS
  Filled 2020-10-21 (×4): qty 25

## 2020-10-21 NOTE — Progress Notes (Signed)
[facility]  ROUNDING NOTE   Subjective:   Renee [name] is a 44 years old female with chronic kidney disease, hypertension, obesity, heart failure with preserved ejection fraction, presented to the emergency room on 10/20/2020 with worsening shortness of breath and bilateral lower extremity edema  Objective:  Vital signs in last 24 hours:  Temp:  [97.7 F (36.5 C)-98 F (36.7 C)] 98 F (36.7 C) (10/22 0429) Pulse Rate:  [82-94] 82 (10/22 1400) Resp:  [18-24] 18 (10/22 1400) BP: (167-242)/(91-130) 175/91 (10/22 1400) SpO2:  [98 %-100 %] 100 % (10/22 1400) Weight:  [110.6 kg] 110.6 kg (10/21 2112)  Weight change:  Filed Weights   10/20/20 2112  Weight: 110.6 kg    Intake/Output: No intake/output data recorded.   Intake/Output this shift:  No intake/output data recorded.  Physical Exam: General: Resting in bed,in tears while talking  Head: Normocephalic, atraumatic. Moist oral mucosal membranes  Eyes: Anicteric  Lungs:   Respirations even, mildly labored, crackles + bilaterally, able to talk in full sentences, dyspnea on exertion +  Heart: Regular rate and rhythm, SR in the 80s to 90s  Abdomen:  Soft, nontender  Extremities:  1+ peripheral edema.  Neurologic:  Oriented, moving all four extremities  Skin: No acute lesions or rashes  Access:  To be established    Basic Metabolic Panel: Recent Labs  Lab 10/20/20 2119 10/21/20 0458  NA 135 136  K 3.4* 3.3*  CL 102 103  CO2 12* 13*  GLUCOSE 171* 138*  BUN 146* 153*  CREATININE 13.49* 13.63*  CALCIUM 6.9* 6.9*  MG  --  2.6*  PHOS  --  9.7*    Liver Function Tests: No results for input(s): AST, ALT, ALKPHOS, BILITOT, PROT, ALBUMIN in the last 168 hours. No results for input(s): LIPASE, AMYLASE in the last 168 hours. No results for input(s): AMMONIA in the last 168 hours.  CBC: Recent Labs  Lab 10/20/20 2119  WBC 11.3*  HGB 8.0*  HCT 26.1*  MCV 75.2*  PLT 348    Cardiac Enzymes: No  results for input(s): CKTOTAL, CKMB, CKMBINDEX, TROPONINI in the last 168 hours.  BNP: Invalid input(s): POCBNP  CBG: No results for input(s): GLUCAP in the last 168 hours.  Microbiology: Results for orders placed or performed during the hospital encounter of 10/21/20  Respiratory Panel by RT PCR (Flu A&B, Covid) - Nasopharyngeal Swab     Status: None   Collection Time: 10/21/20  4:58 AM   Specimen: Nasopharyngeal Swab  Result Value Ref Range Status   SARS Coronavirus 2 by RT PCR NEGATIVE NEGATIVE Final    Comment: (NOTE) SARS-CoV-2 target nucleic acids are NOT DETECTED.  The SARS-CoV-2 RNA is generally detectable in upper respiratoy specimens during the acute phase of infection. The lowest concentration of SARS-CoV-2 viral copies this assay can detect is 131 copies/mL. A negative result does not preclude SARS-Cov-2 infection and should not be used as the sole basis for treatment or other patient management decisions. A negative result may occur with  improper specimen collection/handling, submission of specimen other than nasopharyngeal swab, presence of viral mutation(s) within the areas targeted by this assay, and inadequate number of viral copies (<131 copies/mL). A negative result must be combined with clinical observations, patient history, and epidemiological information. The expected result is Negative.  Fact Sheet for Patients:  PinkCheek.be  Fact Sheet for Healthcare Providers:  GravelBags.it  This test is no t yet approved or cleared by the [place] and  has been authorized for detection and/or diagnosis of SARS-CoV-2 by FDA under an Emergency Use Authorization (EUA). This EUA will remain  in effect (meaning this test can be used) for the duration of the COVID-19 declaration under Section 564(b)(1) of the Act, 21 U.S.C. section 360bbb-3(b)(1), unless the authorization is terminated or revoked  sooner.     Influenza A by PCR NEGATIVE NEGATIVE Final   Influenza B by PCR NEGATIVE NEGATIVE Final    Comment: (NOTE) The Xpert Xpress SARS-CoV-2/FLU/RSV assay is intended as an aid in  the diagnosis of influenza from Nasopharyngeal swab specimens and  should not be used as a sole basis for treatment. Nasal washings and  aspirates are unacceptable for Xpert Xpress SARS-CoV-2/FLU/RSV  testing.  Fact Sheet for Patients: PinkCheek.be  Fact Sheet for Healthcare Providers: GravelBags.it  This test is not yet approved or cleared by the [place] FDA and  has been authorized for detection and/or diagnosis of SARS-CoV-2 by  FDA under an Emergency Use Authorization (EUA). This EUA will remain  in effect (meaning this test can be used) for the duration of the  Covid-19 declaration under Section 564(b)(1) of the Act, 21  U.S.C. section 360bbb-3(b)(1), unless the authorization is  terminated or revoked. Performed at [facility], [address] [place], [place] [address]     Coagulation Studies: Recent Labs    10/21/20 1145  LABPROT 15.3*  INR 1.3*    Urinalysis: Recent Labs    10/21/20 0458  COLORURINE YELLOW*  LABSPEC 1.011  PHURINE 5.0  GLUCOSEU NEGATIVE  HGBUR SMALL*  BILIRUBINUR NEGATIVE  KETONESUR NEGATIVE  PROTEINUR 100*  NITRITE POSITIVE*  LEUKOCYTESUR SMALL*      Imaging: DG Chest 2 View  Result Date: 10/20/2020 CLINICAL DATA:  Chest pain and lower extremity swelling. EXAM: CHEST - 2 VIEW COMPARISON:  January 20, 2020 FINDINGS: Very mildly increased interstitial lung markings are noted, bilaterally. Mild atelectasis and/or infiltrate is seen within the right lung base. There is a small right pleural effusion. No pneumothorax is identified. The cardiac silhouette is markedly enlarged. The visualized skeletal structures are unremarkable. IMPRESSION: 1. Cardiomegaly with very mild  interstitial edema. 2. Mild right basilar atelectasis and/or infiltrate. 3. Small right pleural effusion. Electronically Signed   By: Virgina Norfolk M.D.   On: 10/20/2020 21:49     Medications:   . [START ON 10/22/2020] cefTRIAXone (ROCEPHIN)  IV    . furosemide (LASIX) infusion 8 mg/hr (10/21/20 0645)   . [START ON 10/22/2020] amLODipine  10 mg Oral Daily  . aspirin  81 mg Oral Daily  . [START ON 10/22/2020] calcitRIOL  0.25 mcg Oral Once per day on Mon Wed Sat  . carvedilol  25 mg Oral BID WC  . cloNIDine  0.2 mg Oral TID  . heparin  5,000 Units Subcutaneous Q8H  . hydrALAZINE  50 mg Oral TID  . insulin aspart  0-5 Units Subcutaneous QHS  . insulin aspart  0-9 Units Subcutaneous TID WC  . insulin glargine  13 Units Subcutaneous BID  . isosorbide mononitrate  30 mg Oral Daily  . levETIRAcetam  500 mg Oral BID  . pravastatin  20 mg Oral q1800  . sodium bicarbonate  650 mg Oral TID   acetaminophen, albuterol, dextromethorphan-guaiFENesin, hydrALAZINE, ondansetron (ZOFRAN) IV  Assessment/ Plan  Ms. Francisca Langenderfer is a 44 y.o.  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 Associated with uncontrolled hypertension Patient has progressively worsening chronic kidney disease Presented with SOB, orthopnea, dyspnea on exertion -Appears volume overloaded Baseline creatinine on 01/23/2020 4.90 GFR 12 On 10/21/2020 -Creatinine 13.63 -BUN 153 -GFR 3 - Dr.Singh educated the patient, emphasizing the need for urgent  dialysis  Explained critical lab values which indicate worsening renal function  Discussed plan for dialysis access placement today  Patient requesting more time to make the decision  Family member also contacted over the phone  Patient will contact us back, when she is  ready to go forward  Dialysis educator will contact the patient today   # Hypertensive Crisis # Heart failure with preserved ejection fraction Blood pressure readings on presentation to the ED was 240s over 120s Patient was not taking her blood pressure medicines for months as she was not able to afford it -Received clonidine, amlodipine, nitroglycerin paste and IV labetalol in ED -Blood pressure readings still elevated, 161W systolic Patient currently on furosemide 8mg /h, received a  bolus dose of  80 mg  Chest Xray on 10/20/2020 Impression 1. Cardiomegaly with very mild interstitial edema. 2. Mild right basilar atelectasis and/or infiltrate. 3. Small right pleural effusion  #Secondary hyperparathyroidism Phosphorus 9.7 Calcium 6.9 PTH 139 on 08/27/2019  #Anemia of chronic kidney disease Hemoglobin 8.0 Will continue monitoring   LOS: 0 Boy Delamater 10/22/20212:26 PM

## 2020-10-21 NOTE — ED Notes (Signed)
Pt given breakfast tray

## 2020-10-21 NOTE — ED Notes (Signed)
Pt requests to eat, hospitalist messaged and will get back to this RN.

## 2020-10-21 NOTE — ED Notes (Addendum)
Nephrology at bedside

## 2020-10-21 NOTE — H&P (Signed)
History and Physical    Trust Crago XBJ:[id number] DOB: October 15, 1976 DOA: 10/21/2020  Referring MD/NP/PA:   PCP: Minda Ditto, MD   Patient coming from:  The patient is coming from home.  At baseline, pt is independent for most of ADL.        Chief Complaint: Worsening leg edema, shortness of breath, chest pain, increased urinary frequency  HPI: Renee [name] is a 44 y.o. female with medical history significant of hypertension, hyperlipidemia, diabetes mellitus, CKD stage IV, dCHF, morbid obesity, who presents with worsening leg edema, shortness breath, chest pain, increased urinary frequency.  Patient states that he has not been taking her medications for more than one month.  She developed worsening bilateral leg edema and shortness of breath, which has been progressively worsening in the past several days.  She also has chest pain, which is located in the front chest, dull, mild, pressure-like, nonradiating.  Patient has dry cough, but no fever or chills.  She has had increased urinary frequency, but denies dysuria or burning on urination.  Patient denies abdominal pain, nausea, vomiting, diarrhea.  No unilateral weakness or numbness.  ED Course: pt was found to have worsening renal function with baseline creatinine 4.9 on 01/23/2020 --> 13.63, BUN 153, potassium 3.3, calcium 6.9, trop 59-->68 -->75, WBC 11.3, positive urinalysis for UTI (cloudy appearance, small amount of leukocyte, positive nitrite, WBC 21-50), negative Covid PCR. Blood pressure 242/130, heart rate 94, RR 24, oxygen saturation 92% on room air, temperature normal.  Chest x-ray showed cardiomegaly and mild interstitial edema.  Patient is admitted to progressive bed as inpatient.  Dr. Candiss Norse of nephrology is consulted.  Review of Systems:   General: no fevers, chills, no body weight gain, has fatigue HEENT: no blurry vision, hearing changes or sore throat Respiratory: has dyspnea, coughing, no wheezing CV: has chest  pain, no palpitations GI: no nausea, vomiting, abdominal pain, diarrhea, constipation GU: no dysuria, burning on urination, has increased urinary frequency, no hematuria  Ext: has leg edema Neuro: no unilateral weakness, numbness, or tingling, no vision change or hearing loss Skin: no rash, no skin tear. MSK: No muscle spasm, no deformity, no limitation of range of movement in spin Heme: No easy bruising.  Travel history: No recent long distant travel.  Allergy: No Known Allergies  Past Medical History:  Diagnosis Date  . (HFpEF) heart failure with preserved ejection fraction ([place])   . CKD (chronic kidney disease), stage IV ([place])   . Hypertension   . Morbid obesity ([place])     Past Surgical History:  Procedure Laterality Date  . CESAREAN SECTION    . TUBAL LIGATION      Social History:  reports that she has never smoked. She has never used smokeless tobacco. She reports that she does not drink alcohol and does not use drugs.  Family History:  Family History  Problem Relation Age of Onset  . Epilepsy Mother   . Hypertension Mother   . Hypertension Father      Prior to Admission medications   Medication Sig Start Date End Date Taking? Authorizing Provider  amLODipine (NORVASC) 10 MG tablet Take 1 tablet (10 mg total) by mouth daily. 08/31/19 10/21/20 Yes Ojie, Jude, MD  carvedilol (COREG) 25 MG tablet Take 1 tablet (25 mg total) by mouth 2 (two) times daily with a meal. 02/11/19  Yes Darylene Price A, FNP  cloNIDine (CATAPRES) 0.2 MG tablet Take 1 tablet (0.2 mg total) by mouth 3 (three) times daily. 07/09/17  Yes Fritzi Mandes, MD  hydrALAZINE (APRESOLINE) 50 MG tablet Take 2 tablets (100 mg total) by mouth 3 (three) times daily. Patient taking differently: Take 50 mg by mouth 3 (three) times daily.  08/30/19 10/21/20 Yes Ojie, Jude, MD  insulin aspart (NOVOLOG) 100 UNIT/ML injection Inject 0-9 Units into the skin every 4 (four) hours. 01/23/20  Yes Arlan Organ, DO  insulin  glargine (LANTUS) 100 UNIT/ML injection Inject 0.25 mLs (25 Units total) into the skin 2 (two) times daily. 01/23/20  Yes Arlan Organ, DO  isosorbide mononitrate (IMDUR) 30 MG 24 hr tablet Take 3 tablets (90 mg total) by mouth 2 (two) times daily. Patient taking differently: Take 30 mg by mouth daily.  08/30/19 10/21/20 Yes Ojie, Jude, MD  aspirin 81 MG chewable tablet Chew 1 tablet (81 mg total) by mouth daily. Patient not taking: Reported on 10/21/2020 01/24/20   Arlan Organ, DO  calcitRIOL (ROCALTROL) 0.25 MCG capsule Take 0.25 mcg by mouth See admin instructions. MON WED SAT Patient not taking: Reported on 10/21/2020    [provider]  levETIRAcetam (KEPPRA) 500 MG tablet Take 1 tablet (500 mg total) by mouth 2 (two) times daily. Patient not taking: Reported on 10/21/2020 01/23/20   Arlan Organ, DO  pravastatin (PRAVACHOL) 20 MG tablet Take 1 tablet (20 mg total) by mouth daily at 6 PM. Patient not taking: Reported on 10/21/2020 01/23/20   Arlan Organ, DO  sodium bicarbonate 650 MG tablet Take 1 tablet (650 mg total) by mouth 3 (three) times daily. Patient not taking: Reported on 10/21/2020 01/23/20   Arlan Organ, DO    Physical Exam: Vitals:   10/21/20 0429 10/21/20 0900 10/21/20 1000 10/21/20 1400  BP: (!) 197/100 (!) 199/91 (!) 202/100 (!) 175/91  Pulse: 85 86 82 82  Resp: 20 19 19 18   Temp: 98 F (36.7 C)     TempSrc: Oral     SpO2: 98%   100%  Weight:      Height:       General: Not in acute distress HEENT:       Eyes: PERRL, EOMI, no scleral icterus.       ENT: No discharge from the ears and nose, no pharynx injection, no tonsillar enlargement.        Neck: No JVD, no bruit, no mass felt. Heme: No neck lymph node enlargement. Cardiac: S1/S2, RRR, No murmurs, No gallops or rubs. Respiratory: has crackles bilaterally  GI: Soft, nondistended, nontender, no rebound pain, no organomegaly, BS present. GU: No hematuria Ext: 1+ pitting leg edema bilaterally.  1+DP/PT pulse bilaterally. Musculoskeletal: No joint deformities, No joint redness or warmth, no limitation of ROM in spin. Skin: No rashes.  Neuro: Alert, oriented X3, cranial nerves II-XII grossly intact, moves all extremities normally. Psych: Patient is not psychotic, no suicidal or hemocidal ideation.  Labs on Admission: I have personally reviewed following labs and imaging studies  CBC: Recent Labs  Lab 10/20/20 2119  WBC 11.3*  HGB 8.0*  HCT 26.1*  MCV 75.2*  PLT 149   Basic Metabolic Panel: Recent Labs  Lab 10/20/20 2119 10/21/20 0458  NA 135 136  K 3.4* 3.3*  CL 102 103  CO2 12* 13*  GLUCOSE 171* 138*  BUN 146* 153*  CREATININE 13.49* 13.63*  CALCIUM 6.9* 6.9*  MG  --  2.6*  PHOS  --  9.7*   GFR: Estimated Creatinine Clearance: 6.5 mL/min (A) (by C-G formula based on SCr  of 13.63 mg/dL (H)). Liver Function Tests: No results for input(s): AST, ALT, ALKPHOS, BILITOT, PROT, ALBUMIN in the last 168 hours. No results for input(s): LIPASE, AMYLASE in the last 168 hours. No results for input(s): AMMONIA in the last 168 hours. Coagulation Profile: Recent Labs  Lab 10/21/20 1145  INR 1.3*   Cardiac Enzymes: No results for input(s): CKTOTAL, CKMB, CKMBINDEX, TROPONINI in the last 168 hours. BNP (last 3 results) No results for input(s): PROBNP in the last 8760 hours. HbA1C: No results for input(s): HGBA1C in the last 72 hours. CBG: Recent Labs  Lab 10/21/20 1651  GLUCAP 112*   Lipid Profile: No results for input(s): CHOL, HDL, LDLCALC, TRIG, CHOLHDL, LDLDIRECT in the last 72 hours. Thyroid Function Tests: No results for input(s): TSH, T4TOTAL, FREET4, T3FREE, THYROIDAB in the last 72 hours. Anemia Panel: No results for input(s): VITAMINB12, FOLATE, FERRITIN, TIBC, IRON, RETICCTPCT in the last 72 hours. Urine analysis:    Component Value Date/Time   COLORURINE YELLOW (A) 10/21/2020 0458   APPEARANCEUR CLOUDY (A) 10/21/2020 0458   LABSPEC 1.011  10/21/2020 0458   PHURINE 5.0 10/21/2020 0458   GLUCOSEU NEGATIVE 10/21/2020 0458   HGBUR SMALL (A) 10/21/2020 0458   BILIRUBINUR NEGATIVE 10/21/2020 0458   KETONESUR NEGATIVE 10/21/2020 0458   PROTEINUR 100 (A) 10/21/2020 0458   NITRITE POSITIVE (A) 10/21/2020 0458   LEUKOCYTESUR SMALL (A) 10/21/2020 0458   Sepsis Labs: @LABRCNTIP (procalcitonin:4,lacticidven:4) ) Recent Results (from the past 240 hour(s))  Respiratory Panel by RT PCR (Flu A&B, Covid) - Nasopharyngeal Swab     Status: None   Collection Time: 10/21/20  4:58 AM   Specimen: Nasopharyngeal Swab  Result Value Ref Range Status   SARS Coronavirus 2 by RT PCR NEGATIVE NEGATIVE Final    Comment: (NOTE) SARS-CoV-2 target nucleic acids are NOT DETECTED.  The SARS-CoV-2 RNA is generally detectable in upper respiratoy specimens during the acute phase of infection. The lowest concentration of SARS-CoV-2 viral copies this assay can detect is 131 copies/mL. A negative result does not preclude SARS-Cov-2 infection and should not be used as the sole basis for treatment or other patient management decisions. A negative result may occur with  improper specimen collection/handling, submission of specimen other than nasopharyngeal swab, presence of viral mutation(s) within the areas targeted by this assay, and inadequate number of viral copies (<131 copies/mL). A negative result must be combined with clinical observations, patient history, and epidemiological information. The expected result is Negative.  Fact Sheet for Patients:  PinkCheek.be  Fact Sheet for Healthcare Providers:  GravelBags.it  This test is no t yet approved or cleared by the [place] FDA and  has been authorized for detection and/or diagnosis of SARS-CoV-2 by FDA under an Emergency Use Authorization (EUA). This EUA will remain  in effect (meaning this test can be used) for the duration of  the COVID-19 declaration under Section 564(b)(1) of the Act, 21 U.S.C. section 360bbb-3(b)(1), unless the authorization is terminated or revoked sooner.     Influenza A by PCR NEGATIVE NEGATIVE Final   Influenza B by PCR NEGATIVE NEGATIVE Final    Comment: (NOTE) The Xpert Xpress SARS-CoV-2/FLU/RSV assay is intended as an aid in  the diagnosis of influenza from Nasopharyngeal swab specimens and  should not be used as a sole basis for treatment. Nasal washings and  aspirates are unacceptable for Xpert Xpress SARS-CoV-2/FLU/RSV  testing.  Fact Sheet for Patients: PinkCheek.be  Fact Sheet for Healthcare Providers: GravelBags.it  This test is not yet  approved or cleared by the [place] and  has been authorized for detection and/or diagnosis of SARS-CoV-2 by  FDA under an Emergency Use Authorization (EUA). This EUA will remain  in effect (meaning this test can be used) for the duration of the  Covid-19 declaration under Section 564(b)(1) of the Act, 21  U.S.C. section 360bbb-3(b)(1), unless the authorization is  terminated or revoked. Performed at [facility], [address], [place],  [address]      Radiological Exams on Admission: DG Chest 2 View  Result Date: 10/20/2020 CLINICAL DATA:  Chest pain and lower extremity swelling. EXAM: CHEST - 2 VIEW COMPARISON:  January 20, 2020 FINDINGS: Very mildly increased interstitial lung markings are noted, bilaterally. Mild atelectasis and/or infiltrate is seen within the right lung base. There is a small right pleural effusion. No pneumothorax is identified. The cardiac silhouette is markedly enlarged. The visualized skeletal structures are unremarkable. IMPRESSION: 1. Cardiomegaly with very mild interstitial edema. 2. Mild right basilar atelectasis and/or infiltrate. 3. Small right pleural effusion. Electronically Signed   By: Virgina Norfolk M.D.   On:  10/20/2020 21:49   US RENAL  Result Date: 10/21/2020 CLINICAL DATA:  Acute renal failure EXAM: RENAL / URINARY TRACT ULTRASOUND COMPLETE COMPARISON:  Ultrasound renal 08/26/2019 FINDINGS: Right Kidney: Renal measurements: 9.1 x 4.2 x 4.3 cm = volume: 85 mL. Increased echogenicity of the renal cortex diffusely. No mass or obstruction Left Kidney: Renal measurements: 8.1 x 3.8 x 3.9 cm = volume: 62 mL. Diffusely increased echogenicity of the renal cortex. No mass or obstruction. Bladder: Not visualized.  Patient has a catheter. Other: Ascites around the liver. IMPRESSION: Small kidneys with diffusely increased echogenicity of the renal cortex. No obstruction. Electronically Signed   By: Franchot Gallo M.D.   On: 10/21/2020 16:21     EKG: I have personally reviewed.  Sinus rhythm, QTC 495, LAD, nonspecific T wave change.  Assessment/Plan Principal Problem:   Acute renal failure superimposed on stage 4 chronic kidney disease (HCC) Active Problems:   Hypokalemia   Hypertensive emergency   Chronic diastolic heart failure (HCC)   HTN (hypertension)   Flash pulmonary edema (HCC)   Type II diabetes mellitus with renal manifestations (HCC)   Chest pain   Elevated troponin   UTI (urinary tract infection)   Hypocalcemia   HLD (hyperlipidemia)   Acute renal failure superimposed on stage 4 chronic kidney disease [facility]): Now patient has end-stage renal disease.  Patient has mild pulmonary edema, but no oxygen desaturation.  Dr. Candiss Norse of renal is consulted.  -Admitted to progressive bed as inpatient -Patient is IV Lasix drip -Treat UTI  UTI (urinary tract infection) -IV Rocephin -Follow-up of blood culture and urine culture  Hypokalemia: Potassium 3.3.  Will not give potassium repletion due to end-stage renal disease -Follow-up with BMP  Hypertensive emergency and history of HTN (hypertension): Initial blood pressure 242/130.  Most likely due to medication noncompliance.  Patient received 0.2  mg clonidine, labetalol 10 mg, amlodipine 10 mg, nitroglycerin paste 50 mg in ED -IV hydralazine as needed -Continue amlodipine Coreg, clonidine, hydralazine -Imdur  Chronic diastolic heart failure and flash pulmonary edema: 2D echo on 08/27/2019 showed EF of 55-60%.  Patient has 1+ leg edema, and mild pulmonary edema on chest x-ray, but no oxygen desaturation. -Patient is IV Lasix drip  Type II diabetes mellitus with renal manifestations [facility]): Recent A1c 16.4, poorly controlled.  Patient not taking her Lantus and NovoLog currently -SSI -Decrease Lantus  dose from 25 to 13 unit daily  Chest pain and Elevated troponin: Troponin 59, 68, 75, likely due to demand ischemia -Aspirin, pravastatin -Check A1c, FLP -Trend troponin  Hypocalcemia: Calcium 6.9 -Give 1 g of calcium gluconate -Continue calcitriol  HLD (hyperlipidemia) -Pravastatin     DVT ppx: SQ Heparin     Code Status: Full code Family Communication: not done, no family member is at bed side.   Disposition Plan:  Anticipate discharge back to previous environment Consults called:  Dr. Candiss Norse of nephrology is consulted. Admission status:  progressive unit as inpt        Status is: Inpatient  Remains inpatient appropriate because:Inpatient level of care appropriate due to severity of illness   Dispo: The patient is from: Home              Anticipated d/c is to: Home              Anticipated d/c date is: 2 days              Patient currently is not medically stable to d/c.          Date of Service 10/21/2020    Ivor Costa [facility]   If 7PM-7AM, please contact night-coverage [email or web address] 10/21/2020, 5:37 PM

## 2020-10-21 NOTE — ED Notes (Signed)
Per micromedex, Rocephin and Lasix compatible.

## 2020-10-21 NOTE — ED Notes (Signed)
Pt currently resting comfortably at this time. NAD noted at this time. Pt is still hypertensive at this time.

## 2020-10-21 NOTE — ED Notes (Signed)
Given report to Quita Skye, RN

## 2020-10-21 NOTE — ED Provider Notes (Signed)
[facility] Emergency Department Provider Note  ____________________________________________   First MD Initiated Contact with Patient 10/21/20 [phone]     (approximate)  I have reviewed the triage vital signs and the nursing notes.   HISTORY  Chief Complaint Hypertension and Shortness of Breath    HPI Renee [name] is a 44 y.o. female with a history of severe chronic kidney disease, poorly controlled hypertension, obesity, and heart failure with preserved ejection fraction, who presents for evaluation of worsening shortness of breath and swelling.  This has been gradually worsening over extended period of time but has become severe.  The shortness of breath is much worse with exertion and better when she rests.  She is also having some dull and occasionally sharp aching pain in her chest.  She said that everything is swollen including both of her legs and her abdomen.  She has not been able to afford her blood pressure medicine for months.  She recently contacted her primary care doctor and had some medication refill that she is currently not taking it.  She has been seen by nephrology in the past and even considered but rejected from the kidney transplant list.  She is not on dialysis.   She has not been vaccinated for COVID-19.  She denies fever, sore throat, nausea, vomiting.  She has had some aching abdominal pain but mostly just feels tight.  She said that she urinates "all the time" and has no burning when she urinates.        Past Medical History:  Diagnosis Date  . (HFpEF) heart failure with preserved ejection fraction ([place])   . CKD (chronic kidney disease), stage IV ([place])   . Hypertension   . Morbid obesity [facility])     Patient Active Problem List   Diagnosis Date Noted  . Acute CHF (congestive heart failure) ([place]) 10/21/2020  . Hyperglycemic hyperosmolar nonketotic coma ([place])   . Status epilepticus ([place]) 01/19/2020  . Flash pulmonary edema ([place])  08/27/2019  . HTN (hypertension), malignant 08/26/2019  . HTN (hypertension) 01/29/2019  . Chronic diastolic heart failure ([place]) 08/14/2017  . Hypertensive emergency 07/05/2017  . 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 Acute on chronic renal failure ([place]) 06/14/2017  . Hyponatremia 06/14/2017  . Hypokalemia 06/14/2017  . Leg swelling 06/14/2017  . Cardiomyopathy ([place]) 04/08/2015  . Accelerated hypertension 04/08/2015  . Diabetes mellitus, type 2 ([place]) 04/08/2015    Past Surgical History:  Procedure Laterality Date  . CESAREAN SECTION    . TUBAL LIGATION      Prior to Admission medications   Medication Sig Start Date End Date Taking? Authorizing Provider  amLODipine (NORVASC) 10 MG tablet Take 1 tablet (10 mg total) by mouth daily. 08/31/19 10/21/20 Yes Ojie, Jude, MD  carvedilol (COREG) 25 MG tablet Take 1 tablet (25 mg total) by mouth 2 (two) times daily with a meal. 02/11/19  Yes Darylene Price A, FNP  cloNIDine (CATAPRES) 0.2 MG tablet Take 1 tablet (0.2 mg total) by mouth 3 (three) times daily. 07/09/17  Yes Fritzi Mandes, MD  hydrALAZINE (APRESOLINE) 50 MG tablet Take 2 tablets (100 mg total) by mouth 3 (three) times daily. Patient taking differently: Take 50 mg by mouth 3 (three) times daily.  08/30/19 10/21/20 Yes Ojie, Jude, MD  insulin aspart (NOVOLOG) 100 UNIT/ML injection Inject 0-9 Units into the skin every 4 (four) hours. 01/23/20  Yes Arlan Organ, DO  insulin glargine (LANTUS) 100 UNIT/ML injection Inject 0.25 mLs (25 Units total) into  the skin 2 (two) times daily. 01/23/20  Yes Arlan Organ, DO  isosorbide mononitrate (IMDUR) 30 MG 24 hr tablet Take 3 tablets (90 mg total) by mouth 2 (two) times daily. Patient taking differently: Take 30 mg by mouth daily.  08/30/19 10/21/20 Yes Ojie, Jude, MD  aspirin 81 MG chewable tablet Chew 1 tablet (81 mg total) by mouth daily. Patient not taking: Reported on 10/21/2020 01/24/20   Arlan Organ, DO  calcitRIOL (ROCALTROL) 0.25 MCG  capsule Take 0.25 mcg by mouth See admin instructions. MON WED SAT Patient not taking: Reported on 10/21/2020    [provider]  levETIRAcetam (KEPPRA) 500 MG tablet Take 1 tablet (500 mg total) by mouth 2 (two) times daily. Patient not taking: Reported on 10/21/2020 01/23/20   Arlan Organ, DO  pravastatin (PRAVACHOL) 20 MG tablet Take 1 tablet (20 mg total) by mouth daily at 6 PM. Patient not taking: Reported on 10/21/2020 01/23/20   Arlan Organ, DO  sodium bicarbonate 650 MG tablet Take 1 tablet (650 mg total) by mouth 3 (three) times daily. Patient not taking: Reported on 10/21/2020 01/23/20   Arlan Organ, DO    Allergies Patient has no known allergies.  Family History  Problem Relation Age of Onset  . Epilepsy Mother   . Hypertension Mother   . Hypertension Father     Social History Social History   Tobacco Use  . Smoking status: Never Smoker  . Smokeless tobacco: Never Used  Vaping Use  . Vaping Use: Never used  Substance Use Topics  . Alcohol use: No  . Drug use: No    Review of Systems Constitutional: No fever/chills Eyes: No visual changes. ENT: No sore throat. Cardiovascular: Occasional chest pain. Respiratory: Shortness of breath, worse with exertion and recumbent position. Gastrointestinal: Swelling in her abdomen with some aching abdominal pain. Genitourinary: Frequent urination.  Negative for dysuria. Musculoskeletal: Swelling in lower extremities.  Negative for neck pain.  Negative for back pain. Integumentary: Negative for rash. Neurological: Negative for headaches, focal weakness or numbness.   ____________________________________________   PHYSICAL EXAM:  VITAL SIGNS: ED Triage Vitals  Enc Vitals Group     BP 10/20/20 2110 (!) 242/130     Pulse Rate 10/20/20 2110 94     Resp 10/20/20 2110 (!) 24     Temp 10/20/20 2115 97.7 F (36.5 C)     Temp Source 10/20/20 2338 Oral     SpO2 10/20/20 2041 98 %     Weight 10/20/20 2112  110.6 kg (243 lb 13.3 oz)     Height 10/20/20 2112 1.651 m (5\' 5" )     Head Circumference --      Peak Flow --      Pain Score 10/20/20 2112 7     Pain Loc --      Pain Edu? --      Excl. in [place]? --     Constitutional: Alert and oriented.  Eyes: Conjunctivae are normal.  Head: Atraumatic. Nose: No congestion/rhinnorhea. Mouth/Throat: Patient is wearing a mask. Neck: No stridor.  No meningeal signs.   Cardiovascular: Normal rate, regular rhythm. Good peripheral circulation. Grossly normal heart sounds. Respiratory: Increased respiratory effort with some mild retractions and accessory muscle usage.  Minimally coarse breath sounds.  No wheezing. Gastrointestinal: Soft and tense with distention/anasarca.  No tenderness to palpation.  Musculoskeletal: 1-2+ pitting edema in bilateral lower extremities. Neurologic:  Normal speech and language. No gross focal neurologic deficits are  appreciated.  Skin:  Skin is warm, dry and intact. Psychiatric: Mood and affect are normal. Speech and behavior are normal.  ____________________________________________   LABS (all labs ordered are listed, but only abnormal results are displayed)  Labs Reviewed  BASIC METABOLIC PANEL - Abnormal; Notable for the following components:      Result Value   Potassium 3.4 (*)    CO2 12 (*)    Glucose, Bld 171 (*)    BUN 146 (*)    Creatinine, Ser 13.49 (*)    Calcium 6.9 (*)    GFR, Estimated 3 (*)    Anion gap 21 (*)    All other components within normal limits  CBC - Abnormal; Notable for the following components:   WBC 11.3 (*)    RBC 3.47 (*)    Hemoglobin 8.0 (*)    HCT 26.1 (*)    MCV 75.2 (*)    MCH 23.1 (*)    RDW 17.4 (*)    All other components within normal limits  BASIC METABOLIC PANEL - Abnormal; Notable for the following components:   Potassium 3.3 (*)    CO2 13 (*)    Glucose, Bld 138 (*)    BUN 153 (*)    Creatinine, Ser 13.63 (*)    Calcium 6.9 (*)    GFR, Estimated 3 (*)     Anion gap 20 (*)    All other components within normal limits  MAGNESIUM - Abnormal; Notable for the following components:   Magnesium 2.6 (*)    All other components within normal limits  PHOSPHORUS - Abnormal; Notable for the following components:   Phosphorus 9.7 (*)    All other components within normal limits  BETA-HYDROXYBUTYRIC ACID - Abnormal; Notable for the following components:   Beta-Hydroxybutyric Acid 0.28 (*)    All other components within normal limits  URINALYSIS, ROUTINE W REFLEX MICROSCOPIC - Abnormal; Notable for the following components:   Color, Urine YELLOW (*)    APPearance CLOUDY (*)    Hgb urine dipstick SMALL (*)    Protein, ur 100 (*)    Nitrite POSITIVE (*)    Leukocytes,Ua SMALL (*)    Bacteria, UA MANY (*)    All other components within normal limits  TROPONIN I (HIGH SENSITIVITY) - Abnormal; Notable for the following components:   Troponin I (High Sensitivity) 59 (*)    All other components within normal limits  TROPONIN I (HIGH SENSITIVITY) - Abnormal; Notable for the following components:   Troponin I (High Sensitivity) 68 (*)    All other components within normal limits  TROPONIN I (HIGH SENSITIVITY) - Abnormal; Notable for the following components:   Troponin I (High Sensitivity) 75 (*)    All other components within normal limits  RESPIRATORY PANEL BY RT PCR (FLU A&B, COVID)  URINE CULTURE  CALCIUM, IONIZED  BLOOD GAS, VENOUS  POC URINE PREG, ED   ____________________________________________  EKG  ED ECG REPORT I, Hinda Kehr, the attending physician, personally viewed and interpreted this ECG.  Date: 10/20/2020 EKG Time: 20: 58 Rate: 97 Rhythm: normal sinus rhythm QRS Axis: normal Intervals: Borderline LVH, slightly prolonged QTC at 495 ms ST/T Wave abnormalities: Non-specific ST segment / T-wave changes, but no clear evidence of acute ischemia. Narrative Interpretation: no definitive evidence of acute ischemia; does not meet  STEMI criteria.   ____________________________________________  RADIOLOGY I, Hinda Kehr, personally viewed and evaluated these images (plain radiographs) as part of my medical decision making, as well  as reviewing the written report by the radiologist.  ED MD interpretation: Cardiomegaly with mild interstitial edema  Official radiology report(s): DG Chest 2 View  Result Date: 10/20/2020 CLINICAL DATA:  Chest pain and lower extremity swelling. EXAM: CHEST - 2 VIEW COMPARISON:  January 20, 2020 FINDINGS: Very mildly increased interstitial lung markings are noted, bilaterally. Mild atelectasis and/or infiltrate is seen within the right lung base. There is a small right pleural effusion. No pneumothorax is identified. The cardiac silhouette is markedly enlarged. The visualized skeletal structures are unremarkable. IMPRESSION: 1. Cardiomegaly with very mild interstitial edema. 2. Mild right basilar atelectasis and/or infiltrate. 3. Small right pleural effusion. Electronically Signed   By: Virgina Norfolk M.D.   On: 10/20/2020 21:49    ____________________________________________   PROCEDURES   Procedure(s) performed (including Critical Care):  .Critical Care Performed by: Hinda Kehr, MD Authorized by: Hinda Kehr, MD   Critical care provider statement:    Critical care time (minutes):  45   Critical care time was exclusive of:  Separately billable procedures and treating other patients   Critical care was necessary to treat or prevent imminent or life-threatening deterioration of the following conditions:  Metabolic crisis and renal failure   Critical care was time spent personally by me on the following activities:  Development of treatment plan with patient or surrogate, discussions with consultants, evaluation of patient's response to treatment, examination of patient, obtaining history from patient or surrogate, ordering and performing treatments and interventions, ordering  and review of laboratory studies, ordering and review of radiographic studies, pulse oximetry, re-evaluation of patient's condition and review of old charts     ____________________________________________   INITIAL IMPRESSION / MDM / [place] / ED COURSE  As part of my medical decision making, I reviewed the following data within the [place] notes reviewed and incorporated, Labs reviewed , EKG interpreted , Old chart reviewed, Radiograph reviewed , Discussed with admitting physician (Dr. Sidney Ace), Discussed with nephrologist (Dr. Candiss Norse) and reviewed Notes from prior ED visits   Differential diagnosis includes, but is not limited to, acute on chronic renal failure, other electrolyte or metabolic abnormality, DKA or HHS, acute infection such as UTI, pulmonary edema, pneumonia, COVID-19, PE.  I reviewed the medical record including prior visits to nephrology and primary care.  It sounds as if the patient has had difficulty control hypertension for an extended period of time but has been nonadherent to her medication regimen.  I saw nephrology note to mention she was previously on a transplant list but that did not work out.  Now she has had gradually worsening symptoms for an extended period of time and has some mild pulmonary edema and acute on chronic renal failure with a substantially elevated BUN of 146 and a creatinine of 13.5.  Her anion gap is 21 but I suspect this is secondary to her renal failure, not the result of DKA.  However the patient has been awaiting for an extended period of time in the waiting room due to overwhelming ED and hospital patient volumes so I will repeat lab work to see how much they have changed during her wait.  Beta hydroxybutyric acid and VBG are pending as well.  Given her renal failure I also added on additional electrolytes such as magnesium, phosphorus, and ionized calcium given her hypocalcemia on the initial lab work.  She  is still producing urine and will provide a specimen.  Very mild leukocytosis of 11.3 which is  likely noncontributory and hemoglobin of 8 which is actually higher than it has been in the past.  The patient is on the cardiac monitor to evaluate for evidence of arrhythmia and/or significant heart rate changes.  She received her oral blood pressure medicines including clonidine and amlodipine while in the lobby because her initial blood pressure was in the 240s / 120s.  When I saw her blood pressure was about 564/332 systolic.  I believe this should be considered a hypertensive emergency and I ordered labetalol 10 mg IV and an inch of nitroglycerin paste for preload reduction.  I do not believe that she needs BiPAP now for the very mild interstitial edema and reducing her preload should help.  I called and spoke by phone with Dr. Candiss Norse with nephrology.  He recommended furosemide 80 mg IV and a furosemide 80 mg/h IV infusion.  He agreed with the blood pressure control plan.  He agreed with the plan for admission to the hospitalist service and will see the patient in the morning to talk with her about dialysis.  Patient understands the need for admission.     Clinical Course as of Oct 21 720  Fri Oct 21, 2020  9518 I spoke with Dr. Sidney Ace with the hospitalist service and he will admit.   [CF]  8416 The patient's urinalysis is notable for nitrite positive UTI.  I ordered ceftriaxone 1 g IV.   [CF]  6063 Very slightly elevated beta hydroxybutyric acid just above the upper limit of normal.  I do not feel that this represents DKA.  Overall her appearance is not consistent with DKA, her glucose is only slightly elevated, and the anion gap is almost certainly as a result of the renal failure.  I discussed this very possibility with nephrology and he agreed that he does not advise treatment for DKA and that the abnormalities such as the elevated anion gap are almost certainly due to the renal failure.    Beta-Hydroxybutyric Acid(!): 0.28 [CF]    Clinical Course User Index [CF] Hinda Kehr, MD     ____________________________________________  FINAL CLINICAL IMPRESSION(S) / ED DIAGNOSES  Final diagnoses:  Acute renal failure superimposed on stage 4 chronic kidney disease, unspecified acute renal failure type ([place])  Hypertensive emergency  Acute pulmonary edema ([place])  Anasarca associated with disorder of kidney  Chronic anemia  Urinary tract infection without hematuria, site unspecified  Elevated troponin level  Hyperphosphatemia  Hypermagnesemia     MEDICATIONS GIVEN DURING THIS VISIT:  Medications  furosemide (LASIX) 250 mg in dextrose 5 % 250 mL (1 mg/mL) infusion (8 mg/hr Intravenous New Bag/Given 10/21/20 0645)  cefTRIAXone (ROCEPHIN) 1 g in sodium chloride 0.9 % 100 mL IVPB (has no administration in time range)  amLODipine (NORVASC) tablet 10 mg (10 mg Oral Given 10/20/20 2131)  cloNIDine (CATAPRES) tablet 0.2 mg (0.2 mg Oral Given 10/20/20 2131)  labetalol (NORMODYNE) injection 10 mg (10 mg Intravenous Given 10/21/20 0523)  furosemide (LASIX) injection 80 mg (80 mg Intravenous Given 10/21/20 0522)  nitroGLYCERIN (NITROGLYN) 2 % ointment 1 inch (1 inch Topical Given 10/21/20 0522)     ED Discharge Orders    None      *Please note:  Ileana Chalupa was evaluated in Emergency Department on 10/21/2020 for the symptoms described in the history of present illness. She was evaluated in the context of the global COVID-19 pandemic, which necessitated consideration that the patient might be at risk for infection with the SARS-CoV-2 virus that causes COVID-19.  Institutional protocols and algorithms that pertain to the evaluation of patients at risk for COVID-19 are in a state of rapid change based on information released by regulatory bodies including the CDC and federal and state organizations. These policies and algorithms were followed during the patient's care in the ED.   Some ED evaluations and interventions may be delayed as a result of limited staffing during and after the pandemic.*  Note:  This document was prepared using Dragon voice recognition software and may include unintentional dictation errors.   Hinda Kehr, MD 10/21/20 [phone]

## 2020-10-21 NOTE — ED Notes (Signed)
Report to [facility]

## 2020-10-21 NOTE — ED Notes (Addendum)
Pt placed on bed pan, bed changed done and pt cleaned up. 273ml straw colored urine noted

## 2020-10-22 DIAGNOSIS — N390 Urinary tract infection, site not specified: Secondary | ICD-10-CM

## 2020-10-22 DIAGNOSIS — I5032 Chronic diastolic (congestive) heart failure: Secondary | ICD-10-CM

## 2020-10-22 DIAGNOSIS — E876 Hypokalemia: Secondary | ICD-10-CM

## 2020-10-22 DIAGNOSIS — I161 Hypertensive emergency: Secondary | ICD-10-CM

## 2020-10-22 LAB — BASIC METABOLIC PANEL
Anion gap: 18 — ABNORMAL HIGH (ref 5–15)
BUN: 156 mg/dL — ABNORMAL HIGH (ref 6–20)
CO2: 14 mmol/L — ABNORMAL LOW (ref 22–32)
Calcium: 6.6 mg/dL — ABNORMAL LOW (ref 8.9–10.3)
Chloride: 103 mmol/L (ref 98–111)
Creatinine, Ser: 14.22 mg/dL — ABNORMAL HIGH (ref 0.44–1.00)
GFR, Estimated: 3 mL/min — ABNORMAL LOW (ref >60)
Glucose, Bld: 134 mg/dL — ABNORMAL HIGH (ref 70–99)
Potassium: 3.5 mmol/L (ref 3.5–5.1)
Sodium: 135 mmol/L (ref 135–145)

## 2020-10-22 LAB — CBC
HCT: 21.8 % — ABNORMAL LOW (ref 36.0–46.0)
HCT: 22.7 % — ABNORMAL LOW (ref 36.0–46.0)
Hemoglobin: 6.8 g/dL — ABNORMAL LOW (ref 12.0–15.0)
Hemoglobin: 6.9 g/dL — ABNORMAL LOW (ref 12.0–15.0)
MCH: 23 pg — ABNORMAL LOW (ref 26.0–34.0)
MCH: 23.1 pg — ABNORMAL LOW (ref 26.0–34.0)
MCHC: 30.4 g/dL (ref 30.0–36.0)
MCHC: 31.2 g/dL (ref 30.0–36.0)
MCV: 74.1 fL — ABNORMAL LOW (ref 80.0–100.0)
MCV: 75.7 fL — ABNORMAL LOW (ref 80.0–100.0)
Platelets: 248 10*3/uL (ref 150–400)
Platelets: 272 10*3/uL (ref 150–400)
RBC: 2.94 MIL/uL — ABNORMAL LOW (ref 3.87–5.11)
RBC: 3 MIL/uL — ABNORMAL LOW (ref 3.87–5.11)
RDW: 17.4 % — ABNORMAL HIGH (ref 11.5–15.5)
RDW: 17.4 % — ABNORMAL HIGH (ref 11.5–15.5)
WBC: 6 10*3/uL (ref 4.0–10.5)
WBC: 7.3 10*3/uL (ref 4.0–10.5)
nRBC: 0.3 % — ABNORMAL HIGH (ref 0.0–0.2)
nRBC: 0.3 % — ABNORMAL HIGH (ref 0.0–0.2)

## 2020-10-22 LAB — HEPATITIS PANEL, ACUTE
HCV Ab: NONREACTIVE
Hep A IgM: NONREACTIVE
Hep B C IgM: NONREACTIVE
Hepatitis B Surface Ag: NONREACTIVE

## 2020-10-22 LAB — RETICULOCYTES
Immature Retic Fract: 23 % — ABNORMAL HIGH (ref 2.3–15.9)
RBC.: 2.86 MIL/uL — ABNORMAL LOW (ref 3.87–5.11)
Retic Count, Absolute: 65.2 10*3/uL (ref 19.0–186.0)
Retic Ct Pct: 2.3 % (ref 0.4–3.1)

## 2020-10-22 LAB — IRON AND TIBC
Iron: 18 ug/dL — ABNORMAL LOW (ref 28–170)
Saturation Ratios: 6 % — ABNORMAL LOW (ref 10.4–31.8)
TIBC: 308 ug/dL (ref 250–450)
UIBC: 290 ug/dL

## 2020-10-22 LAB — HEPATIC FUNCTION PANEL
ALT: 32 U/L (ref 0–44)
AST: 14 U/L — ABNORMAL LOW (ref 15–41)
Albumin: 2.9 g/dL — ABNORMAL LOW (ref 3.5–5.0)
Alkaline Phosphatase: 116 U/L (ref 38–126)
Bilirubin, Direct: <0.1 mg/dL (ref 0.0–0.2)
Total Bilirubin: 0.5 mg/dL (ref 0.3–1.2)
Total Protein: 6.2 g/dL — ABNORMAL LOW (ref 6.5–8.1)

## 2020-10-22 LAB — GLUCOSE, CAPILLARY
Glucose-Capillary: 100 mg/dL — ABNORMAL HIGH (ref 70–99)
Glucose-Capillary: 94 mg/dL (ref 70–99)
Glucose-Capillary: 105 mg/dL — ABNORMAL HIGH (ref 70–99)
Glucose-Capillary: 113 mg/dL — ABNORMAL HIGH (ref 70–99)

## 2020-10-22 LAB — HEMOGLOBIN A1C
Hgb A1c MFr Bld: 5.9 % — ABNORMAL HIGH (ref 4.8–5.6)
Mean Plasma Glucose: 123 mg/dL

## 2020-10-22 LAB — TROPONIN I (HIGH SENSITIVITY): Troponin I (High Sensitivity): 60 ng/L — ABNORMAL HIGH (ref <18)

## 2020-10-22 LAB — MAGNESIUM: Magnesium: 2.4 mg/dL (ref 1.7–2.4)

## 2020-10-22 LAB — VITAMIN B12: Vitamin B-12: 986 pg/mL — ABNORMAL HIGH (ref 180–914)

## 2020-10-22 LAB — FOLATE: Folate: 7.7 ng/mL (ref >5.9)

## 2020-10-22 LAB — FERRITIN: Ferritin: 20 ng/mL (ref 11–307)

## 2020-10-22 LAB — VITAMIN D 25 HYDROXY (VIT D DEFICIENCY, FRACTURES): Vit D, 25-Hydroxy: 11.61 ng/mL — ABNORMAL LOW (ref 30–100)

## 2020-10-22 MED ORDER — EPOETIN ALFA 10000 UNIT/ML IJ SOLN
4000.0000 [IU] | INTRAMUSCULAR | Status: DC
  Administered 2020-10-23 – 2020-10-24 (×2): 4000 [IU] via SUBCUTANEOUS
  Filled 2020-10-22 (×2): qty 1

## 2020-10-22 MED ORDER — CALCIUM CARBONATE ANTACID 500 MG PO CHEW
1.0000 | CHEWABLE_TABLET | Freq: Four times a day (QID) | ORAL | Status: DC | PRN
  Administered 2020-10-22 – 2020-10-23 (×3): 200 mg via ORAL
  Filled 2020-10-22 (×3): qty 1

## 2020-10-22 MED ORDER — PNEUMOCOCCAL VAC POLYVALENT 25 MCG/0.5ML IJ INJ: 0.5000 mL | INJECTION | INTRAMUSCULAR | Status: DC

## 2020-10-22 MED ORDER — SODIUM CHLORIDE 0.9 % IV SOLN
510.0000 mg | INTRAVENOUS | Status: DC
  Administered 2020-10-23: 510 mg via INTRAVENOUS
  Filled 2020-10-22: qty 17

## 2020-10-22 NOTE — Progress Notes (Signed)
Talked to MD [facility] and ordered to administer Feraheme and Epogen at [facility] 10/23/20. Will notify incoming shift. Will continue to monitor.

## 2020-10-22 NOTE — Progress Notes (Addendum)
Renee [name]  MRN: [id number]  DOB/AGE: 45-Jun-1977 45 y.o.  Primary Care Physician:Patel, Grier Mitts, MD  Admit date: 10/21/2020  Chief Complaint:  Chief Complaint  Patient presents with   Hypertension   Shortness of Breath    S-Pt presented on  10/21/2020 with  Chief Complaint  Patient presents with   Hypertension   Shortness of Breath  . Patient had multiple questions in today's visit. Patient initially started by saying " I know I need dialysis but I want to make my decision." Patient then asked me "what is going on with my kidneys? Patient spouse/significant other/husband was present in the room as well.  After taking permission from the patient I explained to patient and her husband about her kidney related issues. I reviewed patient's data in care everywhere from past few years and showed the patient that how her kidney function has been progressively getting worse because of her 20+ year history of diabetes mellitus. Patient then asked me relevant question about dialysis/dialysis access/dialysis procedure. I answered patient and her husband questions to the best of my ability. After extensive discussion/education/care coordination patient and says "I know I need dialysis but. she still wants to think more about it. "  Medications  amLODipine  10 mg Oral Daily   aspirin  81 mg Oral Daily   calcitRIOL  0.25 mcg Oral Once per day on Mon Wed Sat   carvedilol  25 mg Oral BID WC   cloNIDine  0.2 mg Oral TID   heparin  5,000 Units Subcutaneous Q8H   hydrALAZINE  50 mg Oral TID   insulin aspart  0-5 Units Subcutaneous QHS   insulin aspart  0-9 Units Subcutaneous TID WC   insulin glargine  13 Units Subcutaneous BID   isosorbide mononitrate  30 mg Oral Daily   levETIRAcetam  500 mg Oral BID   [START ON 10/23/2020] pneumococcal 23 valent vaccine  0.5 mL Intramuscular Tomorrow-1000   pravastatin  20 mg Oral q1800   sodium bicarbonate  650 mg Oral TID          OMB:TDHRC from the symptoms mentioned above,there are no other symptoms referable to all systems reviewed.  Physical Exam: Vital signs in last 24 hours: Temp:  [97.5 F (36.4 C)-97.7 F (36.5 C)] 97.7 F (36.5 C) (10/23 0735) Pulse Rate:  [67-82] 67 (10/23 0735) Resp:  [18-23] 18 (10/23 0735) BP: (142-202)/(81-100) 147/95 (10/23 0735) SpO2:  [95 %-100 %] 100 % (10/23 0735) Weight:  [115.8 kg] 115.8 kg (10/23 0508) Weight change: 5.249 kg    Intake/Output from previous day: 10/22 0701 - 10/23 0700 In: 102 [IV Piggyback:50] Out: -  Total I/O In: -  Out: 350 [Urine:350]   Physical Exam:  General- pt is awake,alert, oriented to time place and person  Resp- No acute REsp distress, CTA B/L NO Rhonchi  CVS- S1S2 regular in rate and rhythm, no rubs appreciated  GIT- BS+, soft, Non tender , Non distended, morbidly obese  EXT-1+ LE Edema, Cyanosis    Lab Results:  CBC  Recent Labs    10/22/20 0036 10/22/20 0816  WBC 7.3 6.0  HGB 6.9* 6.8*  HCT 22.7* 21.8*  PLT 272 248    BMET  Recent Labs    10/21/20 0458 10/22/20 0036  NA 136 135  K 3.3* 3.5  CL 103 103  CO2 13* 14*  GLUCOSE 138* 134*  BUN 153* 156*  CREATININE 13.63* 14.22*  CALCIUM 6.9* 6.6*      Most  recent Creatinine trend  Lab Results  Component Value Date   CREATININE 14.22 (H) 10/22/2020   CREATININE 13.63 (H) 10/21/2020   CREATININE 13.49 (H) 10/20/2020    Creat trend 2020 3.2--5.0  2019 2.7--3.8 2018 2.4--3.2 2017 2.0--2.4 2016 1.6  MICRO   Recent Results (from the past 240 hour(s))  Respiratory Panel by RT PCR (Flu A&B, Covid) - Nasopharyngeal Swab     Status: None   Collection Time: 10/21/20  4:58 AM   Specimen: Nasopharyngeal Swab  Result Value Ref Range Status   SARS Coronavirus 2 by RT PCR NEGATIVE NEGATIVE Final    Comment: (NOTE) SARS-CoV-2 target nucleic acids are NOT DETECTED.  The SARS-CoV-2 RNA is generally detectable in upper  respiratoy specimens during the acute phase of infection. The lowest concentration of SARS-CoV-2 viral copies this assay can detect is 131 copies/mL. A negative result does not preclude SARS-Cov-2 infection and should not be used as the sole basis for treatment or other patient management decisions. A negative result may occur with  improper specimen collection/handling, submission of specimen other than nasopharyngeal swab, presence of viral mutation(s) within the areas targeted by this assay, and inadequate number of viral copies (<131 copies/mL). A negative result must be combined with clinical observations, patient history, and epidemiological information. The expected result is Negative.  Fact Sheet for Patients:  PinkCheek.be  Fact Sheet for Healthcare Providers:  GravelBags.it  This test is no t yet approved or cleared by the [place] FDA and  has been authorized for detection and/or diagnosis of SARS-CoV-2 by FDA under an Emergency Use Authorization (EUA). This EUA will remain  in effect (meaning this test can be used) for the duration of the COVID-19 declaration under Section 564(b)(1) of the Act, 21 U.S.C. section 360bbb-3(b)(1), unless the authorization is terminated or revoked sooner.     Influenza A by PCR NEGATIVE NEGATIVE Final   Influenza B by PCR NEGATIVE NEGATIVE Final    Comment: (NOTE) The Xpert Xpress SARS-CoV-2/FLU/RSV assay is intended as an aid in  the diagnosis of influenza from Nasopharyngeal swab specimens and  should not be used as a sole basis for treatment. Nasal washings and  aspirates are unacceptable for Xpert Xpress SARS-CoV-2/FLU/RSV  testing.  Fact Sheet for Patients: PinkCheek.be  Fact Sheet for Healthcare Providers: GravelBags.it  This test is not yet approved or cleared by the [place] FDA and  has been  authorized for detection and/or diagnosis of SARS-CoV-2 by  FDA under an Emergency Use Authorization (EUA). This EUA will remain  in effect (meaning this test can be used) for the duration of the  Covid-19 declaration under Section 564(b)(1) of the Act, 21  U.S.C. section 360bbb-3(b)(1), unless the authorization is  terminated or revoked. Performed at [facility], [address] [place], [place] [address]   CULTURE, BLOOD (ROUTINE X 2) w Reflex to ID Panel     Status: None (Preliminary result)   Collection Time: 10/21/20 11:36 AM   Specimen: BLOOD  Result Value Ref Range Status   Specimen Description BLOOD BLOOD LEFT HAND  Final   Special Requests   Final    BOTTLES DRAWN AEROBIC AND ANAEROBIC Blood Culture adequate volume   Culture   Final    NO GROWTH < 24 HOURS Performed at [facility], [address] [place], [place] [address]    Report Status PENDING  Incomplete  CULTURE, BLOOD (ROUTINE X 2) w Reflex to ID Panel     Status: None (  Preliminary result)   Collection Time: 10/21/20  4:10 PM   Specimen: BLOOD  Result Value Ref Range Status   Specimen Description BLOOD RIGHT ANTECUBITAL  Final   Special Requests   Final    BOTTLES DRAWN AEROBIC AND ANAEROBIC Blood Culture adequate volume   Culture   Final    NO GROWTH < 24 HOURS Performed at [facility], [address] [place], [place] [address]    Report Status PENDING  Incomplete         Impression:   1)Renal     Patient has CKD stage 5 . Patient has CKD since 2016 Patient's CKD is most likelysecondary to HTN Proteinura Present I had extensive discussion with the patient regarding risk/need/benefit for renal replacement therapy especially when patient's creatinine is now 14. Patient's husband was present in the room as well. After extensive discussion about patient's chronic disease progression/different modalities of dialysis/different dialysis access/duration of dialysis/need  risk benefit of the different procedures.  Patient wanted to think more  before making her decision.   2)HTN    Blood pressure is stable    3)Anemia of chronic disease and iron deficiency anemia  CBC Latest Ref Rng & Units 10/22/2020 10/22/2020 10/20/2020  WBC 4.0 - 10.5 K/uL 6.0 7.3 11.3(H)  Hemoglobin 12.0 - 15.0 g/dL 6.8(L) 6.9(L) 8.0(L)  Hematocrit 36 - 46 % 21.8(L) 22.7(L) 26.1(L)  Platelets 150 - 400 K/uL 248 272 348    Results for BRIANNY, SOULLIERE (MRN [id number]) as of 10/22/2020 18:50  Ref. Range 10/22/2020 13:14  Iron Latest Ref Range: 28 - 170 ug/dL 18 (L)  UIBC Latest Units: ug/dL 290  TIBC Latest Ref Range: 250 - 450 ug/dL 308  Saturation Ratios Latest Ref Range: 10.4 - 31.8 % 6 (L)  Ferritin Latest Ref Range: 11 - 307 ng/mL 20  Folate Latest Ref Range: >5.9 ng/mL 7.7     HGb is not at goal (9--11)   4) Secondary hyperparathyroidism -CKD Mineral-Bone Disorder    Lab Results  Component Value Date   PTH 139 (H) 08/27/2019   CALCIUM 6.6 (L) 10/22/2020   PHOS 9.7 (H) 10/21/2020    Secondary Hyperparathyroidism present. Phosphorus is not  at goal.   5)Diastolic CHF  Stable  6) Electrolytes   BMP Latest Ref Rng & Units 10/22/2020 10/21/2020 10/20/2020  Glucose 70 - 99 mg/dL 134(H) 138(H) 171(H)  BUN 6 - 20 mg/dL 156(H) 153(H) 146(H)  Creatinine 0.44 - 1.00 mg/dL 14.22(H) 13.63(H) 13.49(H)  Sodium 135 - 145 mmol/L 135 136 135  Potassium 3.5 - 5.1 mmol/L 3.5 3.3(L) 3.4(L)  Chloride 98 - 111 mmol/L 103 103 102  CO2 22 - 32 mmol/L 14(L) 13(L) 12(L)  Calcium 8.9 - 10.3 mg/dL 6.6(L) 6.9(L) 6.9(L)     Sodium Normonatremic   Potassium Hypokalemia Now better     7)Acid base   Non AG acidosis  Co2 is not at goal Patient is on p.o. bicarb    Plan:   Patient needs renal placement therapy. Patient is reluctant to initiate dialysis We will start patient on Epogen after giving IV iron  I spent more than 55 minutes with the patient./Care  coordination/education    Davianna Deutschman s [facility] 10/22/2020, 9:31 AM

## 2020-10-22 NOTE — Progress Notes (Signed)
PROGRESS NOTE    Renee [name]  SLH:[id number] DOB: 1976-03-08 DOA: 10/21/2020 PCP: Minda Ditto, MD   Brief Narrative: Taken from H&P. Renee [name] is a 44 y.o. female with medical history significant of hypertension, hyperlipidemia, diabetes mellitus, CKD stage IV, dCHF, morbid obesity, who presents with worsening leg edema, shortness breath, chest pain, increased urinary frequency. Patient states that he has not been taking her medications for more than one month. On arrival she was found to have AKI, most likely now end-stage renal disease along with severely elevated blood pressure and chest x-ray with cardiomegaly and interstitial edema.  Nephrology was consulted.  Patient is still quite reluctant to start dialysis.  Subjective: Patient was complaining of some blurry vision and foggy mind.  Appears tired and lethargic.  Denies any nausea or vomiting.  Able to answer all orientation questions.  Had another long discussion regarding starting dialysis, she needs more time to think over it.  Assessment & Plan:   Principal Problem:   Acute renal failure superimposed on stage 4 chronic kidney disease (HCC) Active Problems:   Hypokalemia   Hypertensive emergency   Chronic diastolic heart failure (HCC)   HTN (hypertension)   Flash pulmonary edema (HCC)   Type II diabetes mellitus with renal manifestations (HCC)   Chest pain   Elevated troponin   UTI (urinary tract infection)   Hypocalcemia   HLD (hyperlipidemia)  AKI with CKD stage IV.  Most likely ESRD now.  Worsening creatinine function with BUN at 156 and creatinine at 14.22 today.  350 cc of urinary output recorded.  Nephrology is on board and recommending dialysis. Patient wants more time to think over it. Discussed with patient that if she does not want dialysis then she will qualify for inpatient hospice.  Her blurry vision and foggy mind is due to uremia. -Continue with Lasix infusion for now although not much urinary  output. -Start her on dialysis once she agrees  UTI.  UA with concern of UTI.  Blood cultures negative.  Urine culture with E. coli-pending susceptibility. -Continue ceftriaxone for now-we will de-escalate once more results are available  Hypertension with hypertensive emergency admission.  Patient was not taking any meds for the past month.  Blood pressure improves with intervention. -Continue with amlodipine, Coreg, clonidine and hydralazine. -Continue with Imdur.  Chronic diastolic heart failure and flash pulmonary edema: 2D echo on 08/27/2019 showed EF of 55-60%.  Patient has 2+ leg edema, and mild pulmonary edema on chest x-ray, but saturating well on room air.  Continues to have decreased breath sound at bases.  No significant diuresis. -Patient will need dialysis. -Continue with IV Lasix infusion for now.  Chest pain and elevated troponin.  Chest pain resolved today.   Mildly elevated troponin with a flat curve, most likely secondary to demand with markedly elevated blood pressure. -Continue with aspirin and statin.  Anemia.  Hemoglobin dropped to 6.9 during morning labs today from 8 which was done yesterday.  No obvious bleeding.  Ordered repeat CBC with similar results.  All cell lines decreased, might be some dilutional effect. -Check anemia panel. -We will ask redraw from different side from IV. -She might need transfusion-appears volume overload and still not sure about dialysis.  We will monitor at this time.  Type 2 diabetes mellitus with renal manifestations [facility]):  Repeat A1c with significant improvement to 5.9.  Patient was not taking her meds.  CBG within goal this morning with Lantus. -Continue with Lantus and SSI. -Monitor for  hypoglycemia due to decreased urinary clearance.  Hypocalcemia.  Corrected calcium of 7.5 after receiving 1 g of calcium gluconate on admission.  Most likely secondary to renal disease. -Check parathyroid level. -Check vitamin D levels. -Start  her on supplement.  Metabolic acidosis.  Secondary to worsening renal function. -P.o. sodium bicarbonate replacement.  Hypokalemia.  Resolved today without repletion. -Monitor electrolytes, she is high risk for becoming hyperkalemic.  Hyperlipidemia. -Check lipid panel with morning labs. -Continue pravastatin.  Morbid obesity. Body mass index is 42.5 kg/m.  This will complicate overall prognosis.  Objective: Vitals:   10/22/20 0007 10/22/20 0508 10/22/20 0735 10/22/20 1130  BP: (!) 149/81 (!) 159/90 (!) 147/95 (!) 141/81  Pulse: 68 70 67 62  Resp:  18 18 18   Temp:  97.6 F (36.4 C) 97.7 F (36.5 C) 97.6 F (36.4 C)  TempSrc:  Oral    SpO2:  98% 100% 95%  Weight:  115.8 kg    Height:  5\' 5"  (1.651 m)      Intake/Output Summary (Last 24 hours) at 10/22/2020 1217 Last data filed at 10/22/2020 1130 Gross per 24 hour  Intake 290 ml  Output 350 ml  Net -60 ml   Filed Weights   10/20/20 2112 10/22/20 0508  Weight: 110.6 kg 115.8 kg    Examination:  General exam: Morbidly obese lady, appears calm and comfortable  Respiratory system: Clear to auscultation, decreased breath sound at bases, respiratory effort normal. Cardiovascular system: S1 & S2 heard, RRR.  Gastrointestinal system: Soft, nontender, nondistended, bowel sounds positive. Central nervous system: Alert and oriented. No focal neurological deficits. Extremities: 2+ LE edema, no cyanosis, pulses intact and symmetrical. Psychiatry: Judgement and insight appear normal.     DVT prophylaxis: Heparin Code Status: Full Family Communication: Daughter was updated on phone. Disposition Plan:  Status is: Inpatient  Remains inpatient appropriate because:Inpatient level of care appropriate due to severity of illness   Dispo: The patient is from: Home              Anticipated d/c is to: Home              Anticipated d/c date is: > 3 days              Patient currently is medically stable to d/c.  Consultants:    Nephrology  Procedures:  Antimicrobials:   Data Reviewed: I have personally reviewed following labs and imaging studies  CBC: Recent Labs  Lab 10/20/20 2119 10/22/20 0036 10/22/20 0816  WBC 11.3* 7.3 6.0  HGB 8.0* 6.9* 6.8*  HCT 26.1* 22.7* 21.8*  MCV 75.2* 75.7* 74.1*  PLT 348 272 546   Basic Metabolic Panel: Recent Labs  Lab 10/20/20 2119 10/21/20 0458 10/22/20 0036  NA 135 136 135  K 3.4* 3.3* 3.5  CL 102 103 103  CO2 12* 13* 14*  GLUCOSE 171* 138* 134*  BUN 146* 153* 156*  CREATININE 13.49* 13.63* 14.22*  CALCIUM 6.9* 6.9* 6.6*  MG  --  2.6* 2.4  PHOS  --  9.7*  --    GFR: Estimated Creatinine Clearance: 6.3 mL/min (A) (by C-G formula based on SCr of 14.22 mg/dL (H)). Liver Function Tests: Recent Labs  Lab 10/22/20 0816  AST 14*  ALT 32  ALKPHOS 116  BILITOT 0.5  PROT 6.2*  ALBUMIN 2.9*   No results for input(s): LIPASE, AMYLASE in the last 168 hours. No results for input(s): AMMONIA in the last 168 hours. Coagulation Profile: Recent Labs  Lab 10/21/20 1145  INR 1.3*   Cardiac Enzymes: No results for input(s): CKTOTAL, CKMB, CKMBINDEX, TROPONINI in the last 168 hours. BNP (last 3 results) No results for input(s): PROBNP in the last 8760 hours. HbA1C: Recent Labs    10/21/20 1145  HGBA1C 5.9*   CBG: Recent Labs  Lab 10/21/20 1651 10/21/20 2253 10/22/20 0733 10/22/20 1128  GLUCAP 112* 121* 100* 94   Lipid Profile: No results for input(s): CHOL, HDL, LDLCALC, TRIG, CHOLHDL, LDLDIRECT in the last 72 hours. Thyroid Function Tests: No results for input(s): TSH, T4TOTAL, FREET4, T3FREE, THYROIDAB in the last 72 hours. Anemia Panel: No results for input(s): VITAMINB12, FOLATE, FERRITIN, TIBC, IRON, RETICCTPCT in the last 72 hours. Sepsis Labs: No results for input(s): PROCALCITON, LATICACIDVEN in the last 168 hours.  Recent Results (from the past 240 hour(s))  Respiratory Panel by RT PCR (Flu A&B, Covid) - Nasopharyngeal Swab      Status: None   Collection Time: 10/21/20  4:58 AM   Specimen: Nasopharyngeal Swab  Result Value Ref Range Status   SARS Coronavirus 2 by RT PCR NEGATIVE NEGATIVE Final    Comment: (NOTE) SARS-CoV-2 target nucleic acids are NOT DETECTED.  The SARS-CoV-2 RNA is generally detectable in upper respiratoy specimens during the acute phase of infection. The lowest concentration of SARS-CoV-2 viral copies this assay can detect is 131 copies/mL. A negative result does not preclude SARS-Cov-2 infection and should not be used as the sole basis for treatment or other patient management decisions. A negative result may occur with  improper specimen collection/handling, submission of specimen other than nasopharyngeal swab, presence of viral mutation(s) within the areas targeted by this assay, and inadequate number of viral copies (<131 copies/mL). A negative result must be combined with clinical observations, patient history, and epidemiological information. The expected result is Negative.  Fact Sheet for Patients:  PinkCheek.be  Fact Sheet for Healthcare Providers:  GravelBags.it  This test is no t yet approved or cleared by the [place] FDA and  has been authorized for detection and/or diagnosis of SARS-CoV-2 by FDA under an Emergency Use Authorization (EUA). This EUA will remain  in effect (meaning this test can be used) for the duration of the COVID-19 declaration under Section 564(b)(1) of the Act, 21 U.S.C. section 360bbb-3(b)(1), unless the authorization is terminated or revoked sooner.     Influenza A by PCR NEGATIVE NEGATIVE Final   Influenza B by PCR NEGATIVE NEGATIVE Final    Comment: (NOTE) The Xpert Xpress SARS-CoV-2/FLU/RSV assay is intended as an aid in  the diagnosis of influenza from Nasopharyngeal swab specimens and  should not be used as a sole basis for treatment. Nasal washings and  aspirates are  unacceptable for Xpert Xpress SARS-CoV-2/FLU/RSV  testing.  Fact Sheet for Patients: PinkCheek.be  Fact Sheet for Healthcare Providers: GravelBags.it  This test is not yet approved or cleared by the [place] FDA and  has been authorized for detection and/or diagnosis of SARS-CoV-2 by  FDA under an Emergency Use Authorization (EUA). This EUA will remain  in effect (meaning this test can be used) for the duration of the  Covid-19 declaration under Section 564(b)(1) of the Act, 21  U.S.C. section 360bbb-3(b)(1), unless the authorization is  terminated or revoked. Performed at [facility], [address], [place], [place] [address]   Urine Culture     Status: Abnormal (Preliminary result)   Collection Time: 10/21/20  4:58 AM   Specimen: Urine, Random  Result Value Ref  Range Status   Specimen Description   Final    URINE, RANDOM Performed at [facility], [address] [place], [place] [address]    Special Requests   Final    Normal Performed at [facility], [address] [place], [place] [address]    Culture (A)  Final    >=100,000 COLONIES/mL ESCHERICHIA COLI SUSCEPTIBILITIES TO FOLLOW Performed at [facility] [address] [place], [place] [address]    Report Status PENDING  Incomplete  CULTURE, BLOOD (ROUTINE X 2) w Reflex to ID Panel     Status: None (Preliminary result)   Collection Time: 10/21/20 11:36 AM   Specimen: BLOOD  Result Value Ref Range Status   Specimen Description BLOOD BLOOD LEFT HAND  Final   Special Requests   Final    BOTTLES DRAWN AEROBIC AND ANAEROBIC Blood Culture adequate volume   Culture   Final    NO GROWTH < 24 HOURS Performed at [facility], [address], [place], [place] [address]    Report Status PENDING  Incomplete  CULTURE, BLOOD (ROUTINE X 2) w Reflex to ID Panel     Status: None (Preliminary result)   Collection  Time: 10/21/20  4:10 PM   Specimen: BLOOD  Result Value Ref Range Status   Specimen Description BLOOD RIGHT ANTECUBITAL  Final   Special Requests   Final    BOTTLES DRAWN AEROBIC AND ANAEROBIC Blood Culture adequate volume   Culture   Final    NO GROWTH < 24 HOURS Performed at [facility], [address], [place], [place] [address]    Report Status PENDING  Incomplete     Radiology Studies: DG Chest 2 View  Result Date: 10/20/2020 CLINICAL DATA:  Chest pain and lower extremity swelling. EXAM: CHEST - 2 VIEW COMPARISON:  January 20, 2020 FINDINGS: Very mildly increased interstitial lung markings are noted, bilaterally. Mild atelectasis and/or infiltrate is seen within the right lung base. There is a small right pleural effusion. No pneumothorax is identified. The cardiac silhouette is markedly enlarged. The visualized skeletal structures are unremarkable. IMPRESSION: 1. Cardiomegaly with very mild interstitial edema. 2. Mild right basilar atelectasis and/or infiltrate. 3. Small right pleural effusion. Electronically Signed   By: Virgina Norfolk M.D.   On: 10/20/2020 21:49   US RENAL  Result Date: 10/21/2020 CLINICAL DATA:  Acute renal failure EXAM: RENAL / URINARY TRACT ULTRASOUND COMPLETE COMPARISON:  Ultrasound renal 08/26/2019 FINDINGS: Right Kidney: Renal measurements: 9.1 x 4.2 x 4.3 cm = volume: 85 mL. Increased echogenicity of the renal cortex diffusely. No mass or obstruction Left Kidney: Renal measurements: 8.1 x 3.8 x 3.9 cm = volume: 62 mL. Diffusely increased echogenicity of the renal cortex. No mass or obstruction. Bladder: Not visualized.  Patient has a catheter. Other: Ascites around the liver. IMPRESSION: Small kidneys with diffusely increased echogenicity of the renal cortex. No obstruction. Electronically Signed   By: Franchot Gallo M.D.   On: 10/21/2020 16:21    Scheduled Meds: . amLODipine  10 mg Oral Daily  . aspirin  81 mg Oral Daily  . calcitRIOL  0.25  mcg Oral Once per day on Mon Wed Sat  . carvedilol  25 mg Oral BID WC  . cloNIDine  0.2 mg Oral TID  . heparin  5,000 Units Subcutaneous Q8H  . hydrALAZINE  50 mg Oral TID  . insulin aspart  0-5 Units Subcutaneous QHS  . insulin aspart  0-9 Units Subcutaneous  TID WC  . insulin glargine  13 Units Subcutaneous BID  . isosorbide mononitrate  30 mg Oral Daily  . levETIRAcetam  500 mg Oral BID  . [START ON 10/23/2020] pneumococcal 23 valent vaccine  0.5 mL Intramuscular Tomorrow-1000  . pravastatin  20 mg Oral q1800  . sodium bicarbonate  650 mg Oral TID   Continuous Infusions: . cefTRIAXone (ROCEPHIN)  IV 2 g (10/22/20 0521)  . furosemide (LASIX) infusion 8 mg/hr (10/21/20 0645)     LOS: 1 day   Time spent: 35 minutes.  Lorella Nimrod, MD [facility]  If 7PM-7AM, please contact night-coverage [email or web address]  10/22/2020, 12:17 PM   This record has been created using Systems analyst. Errors have been sought and corrected,but may not always be located. Such creation errors do not reflect on the standard of care.

## 2020-10-22 NOTE — Plan of Care (Signed)
  Problem: Education: Goal: Knowledge of General Education information will improve Description: Including pain rating scale, medication(s)/side effects and non-pharmacologic comfort measures Outcome: Not Progressing   Problem: Health Behavior/Discharge Planning: Goal: Ability to manage health-related needs will improve Outcome: Not Progressing   Problem: Clinical Measurements: Goal: Ability to maintain clinical measurements within normal limits will improve Outcome: Not Progressing Goal: Will remain free from infection Outcome: Not Progressing Goal: Diagnostic test results will improve Outcome: Not Progressing Goal: Respiratory complications will improve Outcome: Not Progressing Goal: Cardiovascular complication will be avoided Outcome: Not Progressing   Problem: Activity: Goal: Risk for activity intolerance will decrease Outcome: Not Progressing   Problem: Nutrition: Goal: Adequate nutrition will be maintained Outcome: Not Progressing   Problem: Coping: Goal: Level of anxiety will decrease Outcome: Not Progressing   Problem: Elimination: Goal: Will not experience complications related to bowel motility Outcome: Not Progressing Goal: Will not experience complications related to urinary retention Outcome: Not Progressing   Problem: Pain Managment: Goal: General experience of comfort will improve Outcome: Not Progressing   Problem: Safety: Goal: Ability to remain free from injury will improve Outcome: Not Progressing   Problem: Skin Integrity: Goal: Risk for impaired skin integrity will decrease Outcome: Not Progressing   Problem: Education: Goal: Knowledge of disease and its progression will improve Outcome: Not Progressing Goal: Individualized Educational Video(s) Outcome: Not Progressing   Problem: Fluid Volume: Goal: Compliance with measures to maintain balanced fluid volume will improve Outcome: Not Progressing   Problem: Health Behavior/Discharge  Planning: Goal: Ability to manage health-related needs will improve Outcome: Not Progressing   Problem: Nutritional: Goal: Ability to make healthy dietary choices will improve Outcome: Not Progressing   Problem: Clinical Measurements: Goal: Complications related to the disease process, condition or treatment will be avoided or minimized Outcome: Not Progressing   Problem: Education: Goal: Ability to demonstrate management of disease process will improve Outcome: Not Progressing Goal: Ability to verbalize understanding of medication therapies will improve Outcome: Not Progressing Goal: Individualized Educational Video(s) Outcome: Not Progressing   Problem: Activity: Goal: Capacity to carry out activities will improve Outcome: Not Progressing   Problem: Cardiac: Goal: Ability to achieve and maintain adequate cardiopulmonary perfusion will improve Outcome: Not Progressing

## 2020-10-23 DIAGNOSIS — Z794 Long term (current) use of insulin: Secondary | ICD-10-CM

## 2020-10-23 DIAGNOSIS — E1122 Type 2 diabetes mellitus with diabetic chronic kidney disease: Secondary | ICD-10-CM

## 2020-10-23 DIAGNOSIS — N185 Chronic kidney disease, stage 5: Secondary | ICD-10-CM

## 2020-10-23 LAB — CBC
HCT: 22.1 % — ABNORMAL LOW (ref 36.0–46.0)
Hemoglobin: 6.6 g/dL — ABNORMAL LOW (ref 12.0–15.0)
MCH: 22.7 pg — ABNORMAL LOW (ref 26.0–34.0)
MCHC: 29.9 g/dL — ABNORMAL LOW (ref 30.0–36.0)
MCV: 75.9 fL — ABNORMAL LOW (ref 80.0–100.0)
Platelets: 259 10*3/uL (ref 150–400)
RBC: 2.91 MIL/uL — ABNORMAL LOW (ref 3.87–5.11)
RDW: 17.2 % — ABNORMAL HIGH (ref 11.5–15.5)
WBC: 6.3 10*3/uL (ref 4.0–10.5)
nRBC: 0 % (ref 0.0–0.2)

## 2020-10-23 LAB — GLUCOSE, CAPILLARY
Glucose-Capillary: 90 mg/dL (ref 70–99)
Glucose-Capillary: 140 mg/dL — ABNORMAL HIGH (ref 70–99)
Glucose-Capillary: 107 mg/dL — ABNORMAL HIGH (ref 70–99)
Glucose-Capillary: 115 mg/dL — ABNORMAL HIGH (ref 70–99)
Glucose-Capillary: 135 mg/dL — ABNORMAL HIGH (ref 70–99)

## 2020-10-23 LAB — PREPARE RBC (CROSSMATCH)

## 2020-10-23 LAB — RENAL FUNCTION PANEL
Albumin: 2.9 g/dL — ABNORMAL LOW (ref 3.5–5.0)
Anion gap: 17 — ABNORMAL HIGH (ref 5–15)
BUN: 155 mg/dL — ABNORMAL HIGH (ref 6–20)
CO2: 15 mmol/L — ABNORMAL LOW (ref 22–32)
Calcium: 6.3 mg/dL — CL (ref 8.9–10.3)
Chloride: 104 mmol/L (ref 98–111)
Creatinine, Ser: 14.19 mg/dL — ABNORMAL HIGH (ref 0.44–1.00)
GFR, Estimated: 3 mL/min — ABNORMAL LOW (ref >60)
Glucose, Bld: 93 mg/dL (ref 70–99)
Phosphorus: 10.9 mg/dL — ABNORMAL HIGH (ref 2.5–4.6)
Potassium: 3.4 mmol/L — ABNORMAL LOW (ref 3.5–5.1)
Sodium: 136 mmol/L (ref 135–145)

## 2020-10-23 LAB — ABO/RH: ABO/RH(D): O POS

## 2020-10-23 LAB — URINE CULTURE
Culture: >=100000 — AB
Special Requests: NORMAL

## 2020-10-23 LAB — LIPID PANEL
Cholesterol: 88 mg/dL (ref 0–200)
HDL: 28 mg/dL — ABNORMAL LOW (ref >40)
LDL Cholesterol: 48 mg/dL (ref 0–99)
Total CHOL/HDL Ratio: 3.1 RATIO
Triglycerides: 60 mg/dL (ref <150)
VLDL: 12 mg/dL (ref 0–40)

## 2020-10-23 LAB — CALCIUM, IONIZED: Calcium, Ionized, Serum: 3.7 mg/dL — ABNORMAL LOW (ref 4.5–5.6)

## 2020-10-23 MED ORDER — CALCIUM GLUCONATE-NACL 2-0.675 GM/100ML-% IV SOLN
2.0000 g | Freq: Once | INTRAVENOUS | Status: AC
  Administered 2020-10-23: 2000 mg via INTRAVENOUS
  Filled 2020-10-23: qty 100

## 2020-10-23 MED ORDER — CEPHALEXIN 250 MG PO CAPS
250.0000 mg | ORAL_CAPSULE | Freq: Two times a day (BID) | ORAL | Status: AC
  Administered 2020-10-24 – 2020-10-25 (×4): 250 mg via ORAL
  Filled 2020-10-23 (×4): qty 1

## 2020-10-23 MED ORDER — POTASSIUM CHLORIDE 20 MEQ PO PACK
20.0000 meq | PACK | Freq: Once | ORAL | Status: AC
  Administered 2020-10-23: 20 meq via ORAL
  Filled 2020-10-23: qty 1

## 2020-10-23 MED ORDER — SODIUM CHLORIDE 0.9% IV SOLUTION: Freq: Once | INTRAVENOUS | Status: AC

## 2020-10-23 MED ORDER — SODIUM CHLORIDE 0.9 % IV SOLN: 510.0000 mg | INTRAVENOUS | Status: DC

## 2020-10-23 MED ORDER — VITAMIN D (ERGOCALCIFEROL) 1.25 MG (50000 UNIT) PO CAPS
50000.0000 [IU] | ORAL_CAPSULE | ORAL | Status: DC
  Administered 2020-10-23: 50000 [IU] via ORAL
  Filled 2020-10-23: qty 1

## 2020-10-23 MED ORDER — CHLORHEXIDINE GLUCONATE CLOTH 2 % EX PADS
6.0000 | MEDICATED_PAD | Freq: Every day | CUTANEOUS | Status: DC
  Administered 2020-10-24 – 2020-10-27 (×2): 6 via TOPICAL

## 2020-10-23 MED ORDER — TUBERCULIN PPD 5 UNIT/0.1ML ID SOLN
5.0000 [IU] | Freq: Once | INTRADERMAL | Status: AC
  Administered 2020-10-23: 5 [IU] via INTRADERMAL
  Filled 2020-10-23: qty 0.1

## 2020-10-23 NOTE — Plan of Care (Signed)
  Problem: Fluid Volume: Goal: Compliance with measures to maintain balanced fluid volume will improve Outcome: Progressing   Problem: Nutritional: Goal: Ability to make healthy dietary choices will improve Outcome: Progressing   Problem: Education: Goal: Ability to demonstrate management of disease process will improve Outcome: Progressing

## 2020-10-23 NOTE — Progress Notes (Addendum)
Lab called and reported a critical calcium of 6.3. Notify NP Randol Kern. Will continue to monitor.  Update 0639: NP Randol Kern ordered calcium gluconate 2 g/100 ml sodium chloride IVPB. Will continue to monitor.

## 2020-10-23 NOTE — Plan of Care (Signed)
°  Problem: Elimination: Goal: Will not experience complications related to urinary retention Outcome: Progressing   Problem: Safety: Goal: Ability to remain free from injury will improve Outcome: Progressing   Problem: Fluid Volume: Goal: Compliance with measures to maintain balanced fluid volume will improve Outcome: Progressing   Problem: Nutritional: Goal: Ability to make healthy dietary choices will improve Outcome: Progressing

## 2020-10-23 NOTE — Progress Notes (Signed)
Blood transfusion completed, no reaction noted .

## 2020-10-23 NOTE — Progress Notes (Signed)
Patient receiving PRBC transfusion at this time family at bedside , pre transfusion education provided, patient tolerating ,  no reaction noted at this time .

## 2020-10-23 NOTE — Progress Notes (Signed)
PROGRESS NOTE    Renee [name]  SPQ:[id number] DOB: 08-15-1976 DOA: 10/21/2020 PCP: Minda Ditto, MD   Brief Narrative: Taken from H&P. Renee [name] is a 44 y.o. female with medical history significant of hypertension, hyperlipidemia, diabetes mellitus, CKD stage IV, dCHF, morbid obesity, who presents with worsening leg edema, shortness breath, chest pain, increased urinary frequency. Patient states that he has not been taking her medications for more than one month. On arrival she was found to have AKI, most likely now end-stage renal disease along with severely elevated blood pressure and chest x-ray with cardiomegaly and interstitial edema.  Nephrology was consulted.  Patient is still quite reluctant to start dialysis.  Subjective: Patient was again complaining of blurry vision.  Explained that markedly elevated BUN can also cause the symptoms.  She also need an evaluation from an ophthalmology as an outpatient as there is no acute or life-threatening concerns at this time.  Had another long discussion about starting dialysis and with the help of her husband who was present at bedside he agrees to start dialysis.  Assessment & Plan:   Principal Problem:   Acute renal failure superimposed on stage 4 chronic kidney disease (HCC) Active Problems:   Hypokalemia   Hypertensive emergency   Chronic diastolic heart failure (HCC)   HTN (hypertension)   Acute pulmonary edema (HCC)   Type II diabetes mellitus with renal manifestations (HCC)   Chest pain   Elevated troponin   UTI (urinary tract infection)   Hypocalcemia   HLD (hyperlipidemia)  AKI with CKD stage IV.  Most likely ESRD now.  Worsening creatinine function with BUN at 156 and creatinine at 14.22 today.  350 cc of urinary output recorded.  Nephrology is on board and recommending dialysis. Patient agrees to start dialysis.   Her blurry vision and foggy mind is due to uremia. -Continue with Lasix infusion for now although  not much urinary output. -Nephrology will order for HD catheter tomorrow and start dialysis after that, as there is no emergent need to start on Sunday.  UTI.  UA with concern of UTI.  Blood cultures negative.  Urine culture with E. coli-good sensitivity except ampicillin and Augmentin. -Discontinue ceftriaxone and start her on Keflex to complete a 5-day course.  Hypertension with hypertensive emergency admission.  Patient was not taking any meds for the past month.  Blood pressure improves with intervention. -Continue with amlodipine, Coreg, clonidine and hydralazine. -Continue with Imdur.  Chronic diastolic heart failure and flash pulmonary edema: 2D echo on 08/27/2019 showed EF of 55-60%.  Patient has 2+ leg edema, and mild pulmonary edema on chest x-ray, but saturating well on room air.  Continues to have decreased breath sound at bases.  No significant diuresis. -Patient will need dialysis. -Continue with IV Lasix infusion for now.  Chest pain and elevated troponin.  Chest pain resolved today.   Mildly elevated troponin with a flat curve, most likely secondary to demand with markedly elevated blood pressure. -Continue with aspirin and statin.  Anemia.  Hemoglobin dropped to 6.3 , No obvious bleeding.  Anemia panel with some iron deficiency. -She received 1 dose of IV iron and Epogen today. -Give her 1 unit of PRBC. -Continue to monitor  Type 2 diabetes mellitus with renal manifestations [facility]):  Repeat A1c with significant improvement to 5.9.  Patient was not taking her meds.  CBG within goal this morning with Lantus. -Continue with Lantus and SSI. -Monitor for hypoglycemia due to decreased urinary clearance.  Hypocalcemia.  Corrected calcium of 7.2 despite receiving 1 g of calcium gluconate yesterday, overnight NP ordered 2 more grams of calcium gluconate.  Most likely secondary to renal disease. Parathyroid and calcitriol levels are pending, vitamin D 25-hydroxy low at 11 -Start her  on supplement.  Metabolic acidosis.  Secondary to worsening renal function. -P.o. sodium bicarbonate replacement.  Hypokalemia.  Fluctuating between 3.4 and 3.5 -Monitor electrolytes, she is high risk for becoming hyperkalemic.  Hyperlipidemia. -Check lipid panel with morning labs. -Continue pravastatin.  Morbid obesity. Body mass index is 42.3 kg/m.  This will complicate overall prognosis.  Objective: Vitals:   10/23/20 0758 10/23/20 1301 10/23/20 1334 10/23/20 1419  BP: (!) 165/97 122/77 125/76 127/76  Pulse: 64 62 61 61  Resp:  18 16 16   Temp:  98.1 F (36.7 C) 98 F (36.7 C) (!) 97.3 F (36.3 C)  TempSrc:  Oral Oral Oral  SpO2: 100% 100% 97% 98%  Weight:      Height:        Intake/Output Summary (Last 24 hours) at 10/23/2020 1427 Last data filed at 10/23/2020 0656 Gross per 24 hour  Intake 312.01 ml  Output 560 ml  Net -247.99 ml   Filed Weights   10/20/20 2112 10/22/20 0508 10/23/20 0341  Weight: 110.6 kg 115.8 kg 115.3 kg    Examination:  General. Morbidly obese lady, In no acute distress. Pulmonary.  Lungs clear bilaterally, normal respiratory effort. CV.  Regular rate and rhythm, no JVD, rub or murmur. Abdomen.  Soft, nontender, nondistended, BS positive. CNS.  Alert and oriented x3.  No focal neurologic deficit. Extremities. 1+ LE edema, pulses intact and symmetrical. Psychiatry.  Judgment and insight appears normal.  DVT prophylaxis: SCDs-Heparin discontinued today due to worsening hemoglobin. Code Status: Full Family Communication: Husband was updated at bed site. Disposition Plan:  Status is: Inpatient  Remains inpatient appropriate because:Inpatient level of care appropriate due to severity of illness   Dispo: The patient is from: Home              Anticipated d/c is to: Home              Anticipated d/c date is: > 3 days              Patient currently is medically stable to d/c.  Is going to start dialysis tomorrow- will need clip  procedure completed before discharge.  Consultants:   Nephrology  Procedures:  Antimicrobials:  Keflex  Data Reviewed: I have personally reviewed following labs and imaging studies  CBC: Recent Labs  Lab 10/20/20 2119 10/22/20 0036 10/22/20 0816 10/23/20 0455  WBC 11.3* 7.3 6.0 6.3  HGB 8.0* 6.9* 6.8* 6.6*  HCT 26.1* 22.7* 21.8* 22.1*  MCV 75.2* 75.7* 74.1* 75.9*  PLT 348 272 248 381   Basic Metabolic Panel: Recent Labs  Lab 10/20/20 2119 10/21/20 0458 10/22/20 0036 10/23/20 0455  NA 135 136 135 136  K 3.4* 3.3* 3.5 3.4*  CL 102 103 103 104  CO2 12* 13* 14* 15*  GLUCOSE 171* 138* 134* 93  BUN 146* 153* 156* 155*  CREATININE 13.49* 13.63* 14.22* 14.19*  CALCIUM 6.9* 6.9* 6.6* 6.3*  MG  --  2.6* 2.4  --   PHOS  --  9.7*  --  10.9*   GFR: Estimated Creatinine Clearance: 6.3 mL/min (A) (by C-G formula based on SCr of 14.19 mg/dL (H)). Liver Function Tests: Recent Labs  Lab 10/22/20 0816 10/23/20 0455  AST 14*  --  ALT 32  --   ALKPHOS 116  --   BILITOT 0.5  --   PROT 6.2*  --   ALBUMIN 2.9* 2.9*   No results for input(s): LIPASE, AMYLASE in the last 168 hours. No results for input(s): AMMONIA in the last 168 hours. Coagulation Profile: Recent Labs  Lab 10/21/20 1145  INR 1.3*   Cardiac Enzymes: No results for input(s): CKTOTAL, CKMB, CKMBINDEX, TROPONINI in the last 168 hours. BNP (last 3 results) No results for input(s): PROBNP in the last 8760 hours. HbA1C: Recent Labs    10/21/20 1145  HGBA1C 5.9*   CBG: Recent Labs  Lab 10/22/20 1128 10/22/20 1611 10/22/20 2102 10/23/20 0803 10/23/20 1317  GLUCAP 94 105* 113* 90 107*   Lipid Profile: Recent Labs    10/23/20 0455  CHOL 88  HDL 28*  LDLCALC 48  TRIG 60  CHOLHDL 3.1   Thyroid Function Tests: No results for input(s): TSH, T4TOTAL, FREET4, T3FREE, THYROIDAB in the last 72 hours. Anemia Panel: Recent Labs    10/22/20 1314  VITAMINB12 986*  FOLATE 7.7  FERRITIN 20  TIBC  308  IRON 18*  RETICCTPCT 2.3   Sepsis Labs: No results for input(s): PROCALCITON, LATICACIDVEN in the last 168 hours.  Recent Results (from the past 240 hour(s))  Respiratory Panel by RT PCR (Flu A&B, Covid) - Nasopharyngeal Swab     Status: None   Collection Time: 10/21/20  4:58 AM   Specimen: Nasopharyngeal Swab  Result Value Ref Range Status   SARS Coronavirus 2 by RT PCR NEGATIVE NEGATIVE Final    Comment: (NOTE) SARS-CoV-2 target nucleic acids are NOT DETECTED.  The SARS-CoV-2 RNA is generally detectable in upper respiratoy specimens during the acute phase of infection. The lowest concentration of SARS-CoV-2 viral copies this assay can detect is 131 copies/mL. A negative result does not preclude SARS-Cov-2 infection and should not be used as the sole basis for treatment or other patient management decisions. A negative result may occur with  improper specimen collection/handling, submission of specimen other than nasopharyngeal swab, presence of viral mutation(s) within the areas targeted by this assay, and inadequate number of viral copies (<131 copies/mL). A negative result must be combined with clinical observations, patient history, and epidemiological information. The expected result is Negative.  Fact Sheet for Patients:  PinkCheek.be  Fact Sheet for Healthcare Providers:  GravelBags.it  This test is no t yet approved or cleared by the [place] FDA and  has been authorized for detection and/or diagnosis of SARS-CoV-2 by FDA under an Emergency Use Authorization (EUA). This EUA will remain  in effect (meaning this test can be used) for the duration of the COVID-19 declaration under Section 564(b)(1) of the Act, 21 U.S.C. section 360bbb-3(b)(1), unless the authorization is terminated or revoked sooner.     Influenza A by PCR NEGATIVE NEGATIVE Final   Influenza B by PCR NEGATIVE NEGATIVE Final     Comment: (NOTE) The Xpert Xpress SARS-CoV-2/FLU/RSV assay is intended as an aid in  the diagnosis of influenza from Nasopharyngeal swab specimens and  should not be used as a sole basis for treatment. Nasal washings and  aspirates are unacceptable for Xpert Xpress SARS-CoV-2/FLU/RSV  testing.  Fact Sheet for Patients: PinkCheek.be  Fact Sheet for Healthcare Providers: GravelBags.it  This test is not yet approved or cleared by the [place] FDA and  has been authorized for detection and/or diagnosis of SARS-CoV-2 by  FDA under an Emergency Use Authorization (EUA). This EUA  will remain  in effect (meaning this test can be used) for the duration of the  Covid-19 declaration under Section 564(b)(1) of the Act, 21  U.S.C. section 360bbb-3(b)(1), unless the authorization is  terminated or revoked. Performed at [facility], [address], [place], [place] [address]   Urine Culture     Status: Abnormal   Collection Time: 10/21/20  4:58 AM   Specimen: Urine, Random  Result Value Ref Range Status   Specimen Description   Final    URINE, RANDOM Performed at [facility], [address] [place], [place] [address]    Special Requests   Final    Normal Performed at [facility], [address] [place], [place] [address]    Culture >=100,000 COLONIES/mL ESCHERICHIA COLI (A)  Final   Report Status 10/23/2020 FINAL  Final   Organism ID, Bacteria ESCHERICHIA COLI (A)  Final      Susceptibility   Escherichia coli - MIC*    AMPICILLIN >=32 RESISTANT Resistant     CEFAZOLIN <=4 SENSITIVE Sensitive     CEFTRIAXONE <=0.25 SENSITIVE Sensitive     CIPROFLOXACIN <=0.25 SENSITIVE Sensitive     GENTAMICIN <=1 SENSITIVE Sensitive     IMIPENEM <=0.25 SENSITIVE Sensitive     NITROFURANTOIN <=16 SENSITIVE Sensitive     TRIMETH/SULFA <=20 SENSITIVE Sensitive     AMPICILLIN/SULBACTAM 16 INTERMEDIATE  Intermediate     PIP/TAZO <=4 SENSITIVE Sensitive     * >=100,000 COLONIES/mL ESCHERICHIA COLI  CULTURE, BLOOD (ROUTINE X 2) w Reflex to ID Panel     Status: None (Preliminary result)   Collection Time: 10/21/20 11:36 AM   Specimen: BLOOD  Result Value Ref Range Status   Specimen Description BLOOD BLOOD LEFT HAND  Final   Special Requests   Final    BOTTLES DRAWN AEROBIC AND ANAEROBIC Blood Culture adequate volume   Culture   Final    NO GROWTH 2 DAYS Performed at [facility], [address], [place], [place] [address]    Report Status PENDING  Incomplete  CULTURE, BLOOD (ROUTINE X 2) w Reflex to ID Panel     Status: None (Preliminary result)   Collection Time: 10/21/20  4:10 PM   Specimen: BLOOD  Result Value Ref Range Status   Specimen Description BLOOD RIGHT ANTECUBITAL  Final   Special Requests   Final    BOTTLES DRAWN AEROBIC AND ANAEROBIC Blood Culture adequate volume   Culture   Final    NO GROWTH 2 DAYS Performed at [facility], [address] [place], [place] [address]    Report Status PENDING  Incomplete     Radiology Studies: US RENAL  Result Date: 10/21/2020 CLINICAL DATA:  Acute renal failure EXAM: RENAL / URINARY TRACT ULTRASOUND COMPLETE COMPARISON:  Ultrasound renal 08/26/2019 FINDINGS: Right Kidney: Renal measurements: 9.1 x 4.2 x 4.3 cm = volume: 85 mL. Increased echogenicity of the renal cortex diffusely. No mass or obstruction Left Kidney: Renal measurements: 8.1 x 3.8 x 3.9 cm = volume: 62 mL. Diffusely increased echogenicity of the renal cortex. No mass or obstruction. Bladder: Not visualized.  Patient has a catheter. Other: Ascites around the liver. IMPRESSION: Small kidneys with diffusely increased echogenicity of the renal cortex. No obstruction. Electronically Signed   By: Franchot Gallo M.D.   On: 10/21/2020 16:21    Scheduled Meds: . amLODipine  10 mg Oral Daily  . aspirin  81 mg Oral Daily  . calcitRIOL  0.25 mcg  Oral Once per  day on Mon Wed Sat  . carvedilol  25 mg Oral BID WC  . [START ON 10/24/2020] cephALEXin  250 mg Oral Q12H  . [START ON 10/24/2020] Chlorhexidine Gluconate Cloth  6 each Topical Q0600  . cloNIDine  0.2 mg Oral TID  . epoetin (EPOGEN/PROCRIT) injection  4,000 Units Subcutaneous Q7 days  . hydrALAZINE  50 mg Oral TID  . insulin aspart  0-5 Units Subcutaneous QHS  . insulin aspart  0-9 Units Subcutaneous TID WC  . insulin glargine  13 Units Subcutaneous BID  . isosorbide mononitrate  30 mg Oral Daily  . levETIRAcetam  500 mg Oral BID  . pneumococcal 23 valent vaccine  0.5 mL Intramuscular Tomorrow-1000  . pravastatin  20 mg Oral q1800  . sodium bicarbonate  650 mg Oral TID  . tuberculin  5 Units Intradermal Once   Continuous Infusions: . [START ON 10/29/2020] ferumoxytol    . furosemide (LASIX) infusion 8 mg/hr (10/22/20 1739)     LOS: 2 days   Time spent: 25 minutes.  Lorella Nimrod, MD [facility]  If 7PM-7AM, please contact night-coverage [email or web address]  10/23/2020, 2:27 PM   This record has been created using Systems analyst. Errors have been sought and corrected,but may not always be located. Such creation errors do not reflect on the standard of care.

## 2020-10-23 NOTE — Plan of Care (Signed)
  Problem: Education: Goal: Knowledge of General Education information will improve Description: Including pain rating scale, medication(s)/side effects and non-pharmacologic comfort measures Outcome: Progressing   Problem: Clinical Measurements: Goal: Respiratory complications will improve Outcome: Progressing   Problem: Safety: Goal: Ability to remain free from injury will improve Outcome: Progressing   

## 2020-10-23 NOTE — Progress Notes (Signed)
Renee [name]  MRN: [id number]  DOB/AGE: November 07, 1976 44 y.o.  Primary Care Physician:Patel, Grier Mitts, MD  Admit date: 10/21/2020  Chief Complaint:  Chief Complaint  Patient presents with  . Hypertension  . Shortness of Breath    S-Pt presented on  10/21/2020 with  Chief Complaint  Patient presents with  . Hypertension  . Shortness of Breath  . Patient in today we did inform me that she has made her mind that she is okay with dialysis. Patient said she understand why she needs it. I then educated patient about the process going forward.  Educated patient about the different modalities of dialysis.Educated patient about the different modalities of access. Patient spouse was present in the room during discussion Patient offers no other new physical complaints      Medications . amLODipine  10 mg Oral Daily  . aspirin  81 mg Oral Daily  . calcitRIOL  0.25 mcg Oral Once per day on Mon Wed Sat  . carvedilol  25 mg Oral BID WC  . [START ON 10/24/2020] cephALEXin  250 mg Oral Q12H  . [START ON 10/24/2020] Chlorhexidine Gluconate Cloth  6 each Topical Q0600  . cloNIDine  0.2 mg Oral TID  . epoetin (EPOGEN/PROCRIT) injection  4,000 Units Subcutaneous Q7 days  . hydrALAZINE  50 mg Oral TID  . insulin aspart  0-5 Units Subcutaneous QHS  . insulin aspart  0-9 Units Subcutaneous TID WC  . insulin glargine  13 Units Subcutaneous BID  . isosorbide mononitrate  30 mg Oral Daily  . levETIRAcetam  500 mg Oral BID  . pneumococcal 23 valent vaccine  0.5 mL Intramuscular Tomorrow-1000  . pravastatin  20 mg Oral q1800  . sodium bicarbonate  650 mg Oral TID         DGU:YQIHK from the symptoms mentioned above,there are no other symptoms referable to all systems reviewed.  Physical Exam: Vital signs in last 24 hours: Temp:  [97.6 F (36.4 C)-98.1 F (36.7 C)] 98 F (36.7 C) (10/24 1334) Pulse Rate:  [60-65] 61 (10/24 1334) Resp:  [16-18] 16 (10/24 1334) BP: (122-165)/(76-100)  125/76 (10/24 1334) SpO2:  [97 %-100 %] 97 % (10/24 1334) Weight:  [115.3 kg] 115.3 kg (10/24 0341) Weight change: -0.549 kg    Intake/Output from previous day: 10/23 0701 - 10/24 0700 In: 552 [P.O.:360; I.V.:92; IV Piggyback:100] Out: 910 [Urine:910] No intake/output data recorded.   Physical Exam:  General- pt is awake,alert, oriented to time place and person  Resp- No acute REsp distress, CTA B/L NO Rhonchi  CVS- S1S2 regular in rate and rhythm, no rubs appreciated  GIT- BS+, soft, Non tender , Non distended, morbidly obese  EXT-1+ LE Edema, Cyanosis    Lab Results:  CBC  Recent Labs    10/22/20 0816 10/23/20 0455  WBC 6.0 6.3  HGB 6.8* 6.6*  HCT 21.8* 22.1*  PLT 248 259    BMET  Recent Labs    10/22/20 0036 10/23/20 0455  NA 135 136  K 3.5 3.4*  CL 103 104  CO2 14* 15*  GLUCOSE 134* 93  BUN 156* 155*  CREATININE 14.22* 14.19*  CALCIUM 6.6* 6.3*      Most recent Creatinine trend  Lab Results  Component Value Date   CREATININE 14.19 (H) 10/23/2020   CREATININE 14.22 (H) 10/22/2020   CREATININE 13.63 (H) 10/21/2020    Creat trend 2020 3.2--5.0  2019 2.7--3.8 2018 2.4--3.2 2017 2.0--2.4 2016 1.6  MICRO   Recent Results (  from the past 240 hour(s))  Respiratory Panel by RT PCR (Flu A&B, Covid) - Nasopharyngeal Swab     Status: None   Collection Time: 10/21/20  4:58 AM   Specimen: Nasopharyngeal Swab  Result Value Ref Range Status   SARS Coronavirus 2 by RT PCR NEGATIVE NEGATIVE Final    Comment: (NOTE) SARS-CoV-2 target nucleic acids are NOT DETECTED.  The SARS-CoV-2 RNA is generally detectable in upper respiratoy specimens during the acute phase of infection. The lowest concentration of SARS-CoV-2 viral copies this assay can detect is 131 copies/mL. A negative result does not preclude SARS-Cov-2 infection and should not be used as the sole basis for treatment or other patient management decisions. A negative result may occur  with  improper specimen collection/handling, submission of specimen other than nasopharyngeal swab, presence of viral mutation(s) within the areas targeted by this assay, and inadequate number of viral copies (<131 copies/mL). A negative result must be combined with clinical observations, patient history, and epidemiological information. The expected result is Negative.  Fact Sheet for Patients:  PinkCheek.be  Fact Sheet for Healthcare Providers:  GravelBags.it  This test is no t yet approved or cleared by the [place] FDA and  has been authorized for detection and/or diagnosis of SARS-CoV-2 by FDA under an Emergency Use Authorization (EUA). This EUA will remain  in effect (meaning this test can be used) for the duration of the COVID-19 declaration under Section 564(b)(1) of the Act, 21 U.S.C. section 360bbb-3(b)(1), unless the authorization is terminated or revoked sooner.     Influenza A by PCR NEGATIVE NEGATIVE Final   Influenza B by PCR NEGATIVE NEGATIVE Final    Comment: (NOTE) The Xpert Xpress SARS-CoV-2/FLU/RSV assay is intended as an aid in  the diagnosis of influenza from Nasopharyngeal swab specimens and  should not be used as a sole basis for treatment. Nasal washings and  aspirates are unacceptable for Xpert Xpress SARS-CoV-2/FLU/RSV  testing.  Fact Sheet for Patients: PinkCheek.be  Fact Sheet for Healthcare Providers: GravelBags.it  This test is not yet approved or cleared by the [place] FDA and  has been authorized for detection and/or diagnosis of SARS-CoV-2 by  FDA under an Emergency Use Authorization (EUA). This EUA will remain  in effect (meaning this test can be used) for the duration of the  Covid-19 declaration under Section 564(b)(1) of the Act, 21  U.S.C. section 360bbb-3(b)(1), unless the authorization is  terminated or  revoked. Performed at [facility], [address], [place], [place] [address]   Urine Culture     Status: Abnormal   Collection Time: 10/21/20  4:58 AM   Specimen: Urine, Random  Result Value Ref Range Status   Specimen Description   Final    URINE, RANDOM Performed at [facility], [address] [place], [place] [address]    Special Requests   Final    Normal Performed at [facility], [address] [place], [place] [address]    Culture >=100,000 COLONIES/mL ESCHERICHIA COLI (A)  Final   Report Status 10/23/2020 FINAL  Final   Organism ID, Bacteria ESCHERICHIA COLI (A)  Final      Susceptibility   Escherichia coli - MIC*    AMPICILLIN >=32 RESISTANT Resistant     CEFAZOLIN <=4 SENSITIVE Sensitive     CEFTRIAXONE <=0.25 SENSITIVE Sensitive     CIPROFLOXACIN <=0.25 SENSITIVE Sensitive     GENTAMICIN <=1 SENSITIVE Sensitive     IMIPENEM <=0.25 SENSITIVE Sensitive  NITROFURANTOIN <=16 SENSITIVE Sensitive     TRIMETH/SULFA <=20 SENSITIVE Sensitive     AMPICILLIN/SULBACTAM 16 INTERMEDIATE Intermediate     PIP/TAZO <=4 SENSITIVE Sensitive     * >=100,000 COLONIES/mL ESCHERICHIA COLI  CULTURE, BLOOD (ROUTINE X 2) w Reflex to ID Panel     Status: None (Preliminary result)   Collection Time: 10/21/20 11:36 AM   Specimen: BLOOD  Result Value Ref Range Status   Specimen Description BLOOD BLOOD LEFT HAND  Final   Special Requests   Final    BOTTLES DRAWN AEROBIC AND ANAEROBIC Blood Culture adequate volume   Culture   Final    NO GROWTH 2 DAYS Performed at [facility], [address], [place], [place] [address]    Report Status PENDING  Incomplete  CULTURE, BLOOD (ROUTINE X 2) w Reflex to ID Panel     Status: None (Preliminary result)   Collection Time: 10/21/20  4:10 PM   Specimen: BLOOD  Result Value Ref Range Status   Specimen Description BLOOD RIGHT ANTECUBITAL  Final   Special Requests   Final    BOTTLES DRAWN AEROBIC AND  ANAEROBIC Blood Culture adequate volume   Culture   Final    NO GROWTH 2 DAYS Performed at [facility], [address], [place],  [address]    Report Status PENDING  Incomplete         Impression:   1)Renal     Patient has CKD stage 5 . Patient has CKD since 2016 Patient's CKD is most likelysecondary to HTN Proteinura Present I had extensive discussion with the patient regarding risk/need/benefit for renal replacement therapy especially when patient's creatinine is now 14. Patient's husband was present in the room as well. After extensive discussion about patient's chronic disease progression/different modalities of dialysis/different dialysis access/duration of dialysis/need risk benefit of the different procedures.  Patient wanted to think more  before making her decision.   2)HTN    Blood pressure is stable    3)Anemia of chronic disease and iron deficiency anemia  CBC Latest Ref Rng & Units 10/23/2020 10/22/2020 10/22/2020  WBC 4.0 - 10.5 K/uL 6.3 6.0 7.3  Hemoglobin 12.0 - 15.0 g/dL 6.6(L) 6.8(L) 6.9(L)  Hematocrit 36 - 46 % 22.1(L) 21.8(L) 22.7(L)  Platelets 150 - 400 K/uL 259 248 272    Results for VELIA, PAMER (MRN [id number]) as of 10/22/2020 18:50  Ref. Range 10/22/2020 13:14  Iron Latest Ref Range: 28 - 170 ug/dL 18 (L)  UIBC Latest Units: ug/dL 290  TIBC Latest Ref Range: 250 - 450 ug/dL 308  Saturation Ratios Latest Ref Range: 10.4 - 31.8 % 6 (L)  Ferritin Latest Ref Range: 11 - 307 ng/mL 20  Folate Latest Ref Range: >5.9 ng/mL 7.7     HGb is not at goal (9--11) Patient on Epogen/IV iron.   We will give only 1 dose of IV Feraheme as patient is to receive PRBC today    4) Secondary hyperparathyroidism -CKD Mineral-Bone Disorder    Lab Results  Component Value Date   PTH 139 (H) 08/27/2019   CALCIUM 6.3 (LL) 10/23/2020   PHOS 10.9 (H) 10/23/2020    Secondary Hyperparathyroidism present. Phosphorus is not  at  goal.   5)Diastolic CHF  Stable  6) Electrolytes   BMP Latest Ref Rng & Units 10/23/2020 10/22/2020 10/21/2020  Glucose 70 - 99 mg/dL 93 134(H) 138(H)  BUN 6 - 20 mg/dL 155(H) 156(H) 153(H)  Creatinine 0.44 - 1.00 mg/dL 14.19(H) 14.22(H)  13.63(H)  Sodium 135 - 145 mmol/L 136 135 136  Potassium 3.5 - 5.1 mmol/L 3.4(L) 3.5 3.3(L)  Chloride 98 - 111 mmol/L 104 103 103  CO2 22 - 32 mmol/L 15(L) 14(L) 13(L)  Calcium 8.9 - 10.3 mg/dL 6.3(LL) 6.6(L) 6.9(L)     Sodium Normonatremic   Potassium Hypokalemia We will replete    7)Acid base   Non AG acidosis  Co2 is not at goal Patient is on p.o. bicarb   8) infection prevention  Results for YESENA, REAVES (MRN [id number]) as of 10/23/2020 13:43  Ref. Range 10/22/2020 11:30  Hep A Ab, IgM Latest Ref Range: NON REACTIVE  NON REACTIVE  Hepatitis B Surface Ag Latest Ref Range: NON REACTIVE  NON REACTIVE  Hep B Core Ab, IgM Latest Ref Range: NON REACTIVE  NON REACTIVE  HCV Ab Latest Ref Range: NON REACTIVE  NON REACTIVE    Plan:  Dialysis Will initiate dialysis tomorrow 1025/21  Access I have asked for interventional urology consult for tunneled catheter placement I have placed orders for coagulation studies and n.p.o. after midnight  I answered patient care to the best of my ability      Magdaleno Lortie s Cortavius Montesinos 10/23/2020, 1:42 PM

## 2020-10-24 ENCOUNTER — Other Ambulatory Visit (INDEPENDENT_AMBULATORY_CARE_PROVIDER_SITE_OTHER): Payer: Self-pay | Admitting: Vascular Surgery

## 2020-10-24 ENCOUNTER — Inpatient Hospital Stay
Admission: EM | Disposition: A | Discharge status: Home / Self Care | Payer: Self-pay | Source: Home / Self Care | Attending: Internal Medicine

## 2020-10-24 ENCOUNTER — Encounter: Payer: Self-pay | Admitting: Vascular Surgery

## 2020-10-24 DIAGNOSIS — N185 Chronic kidney disease, stage 5: Secondary | ICD-10-CM

## 2020-10-24 DIAGNOSIS — N179 Acute kidney failure, unspecified: Secondary | ICD-10-CM

## 2020-10-24 DIAGNOSIS — I10 Essential (primary) hypertension: Secondary | ICD-10-CM

## 2020-10-24 DIAGNOSIS — E119 Type 2 diabetes mellitus without complications: Secondary | ICD-10-CM

## 2020-10-24 HISTORY — PX: DIALYSIS/PERMA CATHETER INSERTION: CATH118288

## 2020-10-24 LAB — CBC
HCT: 25.8 % — ABNORMAL LOW (ref 36.0–46.0)
Hemoglobin: 8 g/dL — ABNORMAL LOW (ref 12.0–15.0)
MCH: 23.3 pg — ABNORMAL LOW (ref 26.0–34.0)
MCHC: 31 g/dL (ref 30.0–36.0)
MCV: 75.2 fL — ABNORMAL LOW (ref 80.0–100.0)
Platelets: 282 10*3/uL (ref 150–400)
RBC: 3.43 MIL/uL — ABNORMAL LOW (ref 3.87–5.11)
RDW: 17.2 % — ABNORMAL HIGH (ref 11.5–15.5)
WBC: 7.8 10*3/uL (ref 4.0–10.5)
nRBC: 0.3 % — ABNORMAL HIGH (ref 0.0–0.2)

## 2020-10-24 LAB — RENAL FUNCTION PANEL
Albumin: 2.9 g/dL — ABNORMAL LOW (ref 3.5–5.0)
Anion gap: 18 — ABNORMAL HIGH (ref 5–15)
BUN: 160 mg/dL — ABNORMAL HIGH (ref 6–20)
CO2: 14 mmol/L — ABNORMAL LOW (ref 22–32)
Calcium: 6.6 mg/dL — ABNORMAL LOW (ref 8.9–10.3)
Chloride: 106 mmol/L (ref 98–111)
Creatinine, Ser: 14.45 mg/dL — ABNORMAL HIGH (ref 0.44–1.00)
GFR, Estimated: 3 mL/min — ABNORMAL LOW (ref >60)
Glucose, Bld: 82 mg/dL (ref 70–99)
Phosphorus: 11.3 mg/dL — ABNORMAL HIGH (ref 2.5–4.6)
Potassium: 3.5 mmol/L (ref 3.5–5.1)
Sodium: 138 mmol/L (ref 135–145)

## 2020-10-24 LAB — TYPE AND SCREEN
ABO/RH(D): O POS
Antibody Screen: NEGATIVE
Unit division: 0

## 2020-10-24 LAB — BPAM RBC
Blood Product Expiration Date: 202111232359
ISSUE DATE / TIME: 202110241310
Unit Type and Rh: 5100

## 2020-10-24 LAB — GLUCOSE, CAPILLARY
Glucose-Capillary: 69 mg/dL — ABNORMAL LOW (ref 70–99)
Glucose-Capillary: 91 mg/dL (ref 70–99)
Glucose-Capillary: 69 mg/dL — ABNORMAL LOW (ref 70–99)
Glucose-Capillary: 106 mg/dL — ABNORMAL HIGH (ref 70–99)
Glucose-Capillary: 178 mg/dL — ABNORMAL HIGH (ref 70–99)
Glucose-Capillary: 63 mg/dL — ABNORMAL LOW (ref 70–99)
Glucose-Capillary: 96 mg/dL (ref 70–99)
Glucose-Capillary: 81 mg/dL (ref 70–99)
Glucose-Capillary: 94 mg/dL (ref 70–99)

## 2020-10-24 LAB — PROTIME-INR
INR: 1.3 — ABNORMAL HIGH (ref 0.8–1.2)
Prothrombin Time: 15.8 seconds — ABNORMAL HIGH (ref 11.4–15.2)

## 2020-10-24 LAB — PREGNANCY, URINE: Preg Test, Ur: NEGATIVE

## 2020-10-24 LAB — CALCITRIOL (1,25 DI-OH VIT D): Vit D, 1,25-Dihydroxy: 8.5 pg/mL — ABNORMAL LOW (ref 19.9–79.3)

## 2020-10-24 SURGERY — DIALYSIS/PERMA CATHETER INSERTION
Anesthesia: Moderate Sedation

## 2020-10-24 MED ORDER — DEXTROSE 50 % IV SOLN
INTRAVENOUS | Status: AC
  Administered 2020-10-24: 50 mL via INTRAVENOUS
  Filled 2020-10-24: qty 50

## 2020-10-24 MED ORDER — DIPHENHYDRAMINE HCL 50 MG/ML IJ SOLN: 50.0000 mg | Freq: Once | INTRAMUSCULAR | Status: DC | PRN

## 2020-10-24 MED ORDER — MIDAZOLAM HCL 2 MG/2ML IJ SOLN
INTRAMUSCULAR | Status: DC | PRN
  Administered 2020-10-24: 2 mg via INTRAVENOUS
  Administered 2020-10-24: 1 mg via INTRAVENOUS

## 2020-10-24 MED ORDER — MIDAZOLAM HCL 2 MG/ML PO SYRP: 8.0000 mg | ORAL_SOLUTION | Freq: Once | ORAL | Status: DC | PRN

## 2020-10-24 MED ORDER — INSULIN GLARGINE 100 UNIT/ML ~~LOC~~ SOLN
13.0000 [IU] | Freq: Every day | SUBCUTANEOUS | Status: DC
  Administered 2020-10-26 – 2020-10-27 (×2): 13 [IU] via SUBCUTANEOUS
  Filled 2020-10-24 (×4): qty 0.13

## 2020-10-24 MED ORDER — CALCIUM GLUCONATE-NACL 2-0.675 GM/100ML-% IV SOLN
2.0000 g | Freq: Once | INTRAVENOUS | Status: AC
  Administered 2020-10-24: 2000 mg via INTRAVENOUS
  Filled 2020-10-24: qty 100

## 2020-10-24 MED ORDER — DEXTROSE 50 % IV SOLN
12.5000 g | Freq: Once | INTRAVENOUS | Status: AC
  Administered 2020-10-24: 12.5 g via INTRAVENOUS
  Filled 2020-10-24: qty 50

## 2020-10-24 MED ORDER — FENTANYL CITRATE (PF) 100 MCG/2ML IJ SOLN
INTRAMUSCULAR | Status: DC | PRN
  Administered 2020-10-24: 25 ug via INTRAVENOUS
  Administered 2020-10-24: 50 ug via INTRAVENOUS

## 2020-10-24 MED ORDER — CEFAZOLIN SODIUM-DEXTROSE 1-4 GM/50ML-% IV SOLN
1.0000 g | Freq: Once | INTRAVENOUS | Status: AC
  Filled 2020-10-24: qty 50

## 2020-10-24 MED ORDER — DEXTROSE 50 % IV SOLN
INTRAVENOUS | Status: AC
  Filled 2020-10-24: qty 50

## 2020-10-24 MED ORDER — CALCIUM CARBONATE 1250 (500 CA) MG PO TABS
1250.0000 mg | ORAL_TABLET | Freq: Two times a day (BID) | ORAL | Status: DC
  Administered 2020-10-25 – 2020-10-27 (×5): 1250 mg via ORAL
  Filled 2020-10-24 (×9): qty 1

## 2020-10-24 MED ORDER — FAMOTIDINE 20 MG PO TABS: 40.0000 mg | ORAL_TABLET | Freq: Once | ORAL | Status: DC | PRN

## 2020-10-24 MED ORDER — CEFAZOLIN SODIUM-DEXTROSE 1-4 GM/50ML-% IV SOLN
INTRAVENOUS | Status: AC
  Administered 2020-10-24: 1 g via INTRAVENOUS
  Filled 2020-10-24: qty 50

## 2020-10-24 MED ORDER — MIDAZOLAM HCL 5 MG/5ML IJ SOLN
INTRAMUSCULAR | Status: AC
  Filled 2020-10-24: qty 5

## 2020-10-24 MED ORDER — ALTEPLASE 2 MG IJ SOLR: 2.0000 mg | Freq: Once | INTRAMUSCULAR | Status: DC | PRN

## 2020-10-24 MED ORDER — HEPARIN SODIUM (PORCINE) 1000 UNIT/ML DIALYSIS
1000.0000 [IU] | INTRAMUSCULAR | Status: DC | PRN
  Filled 2020-10-24: qty 1

## 2020-10-24 MED ORDER — SODIUM CHLORIDE 0.9 % IV SOLN: INTRAVENOUS | Status: DC

## 2020-10-24 MED ORDER — PENTAFLUOROPROP-TETRAFLUOROETH EX AERO
1.0000 "application " | INHALATION_SPRAY | CUTANEOUS | Status: DC | PRN
  Filled 2020-10-24: qty 30

## 2020-10-24 MED ORDER — METHYLPREDNISOLONE SODIUM SUCC 125 MG IJ SOLR: 125.0000 mg | Freq: Once | INTRAMUSCULAR | Status: DC | PRN

## 2020-10-24 MED ORDER — FENTANYL CITRATE (PF) 100 MCG/2ML IJ SOLN
INTRAMUSCULAR | Status: AC
Start: 2020-10-24 — End: 2020-10-25
  Filled 2020-10-24: qty 2

## 2020-10-24 MED ORDER — LIDOCAINE HCL (PF) 1 % IJ SOLN
5.0000 mL | INTRAMUSCULAR | Status: DC | PRN
  Filled 2020-10-24: qty 5

## 2020-10-24 MED ORDER — SODIUM CHLORIDE 0.9 % IV SOLN: 100.0000 mL | INTRAVENOUS | Status: DC | PRN

## 2020-10-24 MED ORDER — LIDOCAINE-PRILOCAINE 2.5-2.5 % EX CREA
1.0000 "application " | TOPICAL_CREAM | CUTANEOUS | Status: DC | PRN
  Filled 2020-10-24: qty 5

## 2020-10-24 MED ORDER — HYDROMORPHONE HCL 1 MG/ML IJ SOLN
1.0000 mg | Freq: Once | INTRAMUSCULAR | Status: AC | PRN
  Administered 2020-10-24: 1 mg via INTRAVENOUS
  Filled 2020-10-24: qty 1

## 2020-10-24 MED ORDER — ONDANSETRON HCL 4 MG/2ML IJ SOLN: 4.0000 mg | Freq: Four times a day (QID) | INTRAMUSCULAR | Status: DC | PRN

## 2020-10-24 MED ORDER — DEXTROSE 50 % IV SOLN
12.5000 g | Freq: Once | INTRAVENOUS | Status: AC
  Administered 2020-10-24: 12.5 g via INTRAVENOUS

## 2020-10-24 MED ORDER — DEXTROSE 50 % IV SOLN: 1.0000 | Freq: Once | INTRAVENOUS | Status: AC

## 2020-10-24 SURGICAL SUPPLY — 4 items
CATH PALIN MAXID VT KIT 19CM (CATHETERS) ×2 IMPLANT
PACK ANGIOGRAPHY (CUSTOM PROCEDURE TRAY) ×2 IMPLANT
SUT MNCRL AB 4-0 PS2 18 (SUTURE) ×2 IMPLANT
SUT PROLENE 0 CT 1 30 (SUTURE) ×2 IMPLANT

## 2020-10-24 NOTE — Consult Note (Signed)
[name] V[facility] Consult Note  MRN : [id number]  Renee [name] is a 44 y.o. (06-15-1976) female who presents with chief complaint of  Chief Complaint  Patient presents with  . Hypertension  . Shortness of Breath   History of Present Illness:  Renee [name] is a 44 year old female with medical history significant of hypertension, hyperlipidemia, diabetes mellitus, CKD stage IV, dCHF, morbid obesity, who presents with worsening leg edema, shortness breath, chest pain, increased urinary frequency.  Patient states that he has not been taking her medications for more than one month.  She developed worsening bilateral leg edema and shortness of breath, which has been progressively worsening in the past several days.  She also has chest pain, which is located in the front chest, dull, mild, pressure-like, nonradiating.  Patient has dry cough, but no fever or chills.  She has had increased urinary frequency, but denies dysuria or burning on urination.  Patient denies abdominal pain, nausea, vomiting, diarrhea.  No unilateral weakness or numbness.  The patient was also seen by nephrology who wanted to initiate dialysis however the patient was hesitant at first.  After multiple discussions with her primary team and nephrology the patient has decided to move forward with the initiation of dialysis.  The patient does not have adequate dialysis access at this time so vascular surgery was consulted by NP Raju for placement of a PermCath.  Current Facility-Administered Medications  Medication Dose Route Frequency Provider Last Rate Last Admin  . [MAR Hold] 0.9 %  sodium chloride infusion  100 mL Intravenous PRN Bhutani, Manpreet S, MD      . Doug Sou Hold] 0.9 %  sodium chloride infusion  100 mL Intravenous PRN Bhutani, Manpreet S, MD      . 0.9 %  sodium chloride infusion   Intravenous Continuous Deetya Drouillard, Macala A, PA-C      . [MAR Hold] acetaminophen (TYLENOL) tablet 650 mg   650 mg Oral Q6H PRN Ivor Costa, MD   650 mg at 10/22/20 0849  . [MAR Hold] albuterol (VENTOLIN HFA) 108 (90 Base) MCG/ACT inhaler 2 puff  2 puff Inhalation Q4H PRN Ivor Costa, MD      . Doug Sou Hold] alteplase (CATHFLO ACTIVASE) injection 2 mg  2 mg Intracatheter Once PRN Liana Gerold, MD      . Doug Sou Hold] amLODipine (NORVASC) tablet 10 mg  10 mg Oral Daily Ivor Costa, MD   10 mg at 10/24/20 1221  . [MAR Hold] aspirin chewable tablet 81 mg  81 mg Oral Daily Ivor Costa, MD   81 mg at 10/24/20 1221  . [MAR Hold] calcitRIOL (ROCALTROL) capsule 0.25 mcg  0.25 mcg Oral Once per day on Mon Wed Sat Ivor Costa, MD   0.25 mcg at 10/24/20 1223  . [MAR Hold] calcium carbonate (OS-CAL - dosed in mg of elemental calcium) tablet 1,250 mg  1,250 mg Oral BID WC Lorella Nimrod, MD      . Doug Sou Hold] calcium carbonate (TUMS - dosed in mg elemental calcium) chewable tablet 200 mg of elemental calcium  1 tablet Oral QID PRN Lorella Nimrod, MD   200 mg of elemental calcium at 10/23/20 2217  . [MAR Hold] carvedilol (COREG) tablet 25 mg  25 mg Oral BID WC Ivor Costa, MD   25 mg at 10/23/20 1653  . [START ON 10/25/2020] ceFAZolin (ANCEF) IVPB 1 g/50 mL premix  1 g Intravenous Once Toyia Jelinek, Jannine A, PA-C      . [  MAR Hold] cephALEXin (KEFLEX) capsule 250 mg  250 mg Oral Q12H Lorella Nimrod, MD   250 mg at 10/24/20 1222  . [MAR Hold] Chlorhexidine Gluconate Cloth 2 % PADS 6 each  6 each Topical Q0600 Liana Gerold, MD   6 each at 10/24/20 0608  . [MAR Hold] cloNIDine (CATAPRES) tablet 0.2 mg  0.2 mg Oral TID Ivor Costa, MD   0.2 mg at 10/24/20 1221  . [MAR Hold] dextromethorphan-guaiFENesin (MUCINEX DM) 30-600 MG per 12 hr tablet 1 tablet  1 tablet Oral BID PRN Ivor Costa, MD      . diphenhydrAMINE (BENADRYL) injection 50 mg  50 mg Intravenous Once PRN Savannah Morford, Janalyn Harder, PA-C      . [MAR Hold] epoetin alfa (EPOGEN) injection 4,000 Units  4,000 Units Subcutaneous Q7 days Liana Gerold, MD   4,000 Units at  10/23/20 1030  . famotidine (PEPCID) tablet 40 mg  40 mg Oral Once PRN Ewing Fandino, Janalyn Harder, PA-C      . [MAR Hold] ferumoxytol (FERAHEME) 510 mg in sodium chloride 0.9 % 100 mL IVPB  510 mg Intravenous Weekly Bhutani, Manpreet S, MD      . furosemide (LASIX) 250 mg in dextrose 5 % 250 mL (1 mg/mL) infusion  8 mg/hr Intravenous Continuous Ivor Costa, MD 8 mL/hr at 10/24/20 0158 8 mg/hr at 10/24/20 0158  . [MAR Hold] heparin injection 1,000 Units  1,000 Units Dialysis PRN Liana Gerold, MD      . Doug Sou Hold] hydrALAZINE (APRESOLINE) injection 5 mg  5 mg Intravenous Q2H PRN Ivor Costa, MD   5 mg at 10/21/20 7169  . [MAR Hold] hydrALAZINE (APRESOLINE) tablet 50 mg  50 mg Oral TID Ivor Costa, MD   50 mg at 10/24/20 1222  . [MAR Hold] HYDROmorphone (DILAUDID) injection 1 mg  1 mg Intravenous Once PRN Tavon Magnussen, Janalyn Harder, PA-C      . [MAR Hold] insulin aspart (novoLOG) injection 0-5 Units  0-5 Units Subcutaneous QHS Ivor Costa, MD      . Doug Sou Hold] insulin aspart (novoLOG) injection 0-9 Units  0-9 Units Subcutaneous TID WC Ivor Costa, MD      . Doug Sou Hold] insulin glargine (LANTUS) injection 13 Units  13 Units Subcutaneous Daily Lorella Nimrod, MD      . Doug Sou Hold] isosorbide mononitrate (IMDUR) 24 hr tablet 30 mg  30 mg Oral Daily Ivor Costa, MD   30 mg at 10/24/20 1223  . [MAR Hold] levETIRAcetam (KEPPRA) tablet 500 mg  500 mg Oral BID Ivor Costa, MD   500 mg at 10/24/20 1222  . [MAR Hold] lidocaine (PF) (XYLOCAINE) 1 % injection 5 mL  5 mL Intradermal PRN Bhutani, Lavina Hamman, MD      . Doug Sou Hold] lidocaine-prilocaine (EMLA) cream 1 application  1 application Topical PRN Bhutani, Manpreet S, MD      . methylPREDNISolone sodium succinate (SOLU-MEDROL) 125 mg/2 mL injection 125 mg  125 mg Intravenous Once PRN Wallie Lagrand, Aanchal A, PA-C      . midazolam (VERSED) 2 MG/ML syrup 8 mg  8 mg Oral Once PRN [place] Carreno, Janalyn Harder, PA-C      . [MAR Hold] ondansetron (ZOFRAN) injection 4 mg  4 mg Intravenous  Q8H PRN Ivor Costa, MD      . Doug Sou Hold] ondansetron [facility]) injection 4 mg  4 mg Intravenous Q6H PRN Amyrie Illingworth, Janalyn Harder, PA-C      . [MAR Hold] pentafluoroprop-tetrafluoroeth (GEBAUERS) aerosol 1 application  1 application Topical  PRN Bhutani, Manpreet S, MD      . pneumococcal 23 valent vaccine (PNEUMOVAX-23) injection 0.5 mL  0.5 mL Intramuscular Tomorrow-1000 Ivor Costa, MD      . Doug Sou Hold] pravastatin (PRAVACHOL) tablet 20 mg  20 mg Oral q1800 Ivor Costa, MD   20 mg at 10/23/20 1653  . [MAR Hold] sodium bicarbonate tablet 650 mg  650 mg Oral TID Ivor Costa, MD   650 mg at 10/24/20 1222  . tuberculin injection 5 Units  5 Units Intradermal Once Liana Gerold, MD   5 Units at 10/23/20 1539  . [MAR Hold] Vitamin D (Ergocalciferol) (DRISDOL) capsule 50,000 Units  50,000 Units Oral Q7 days Lorella Nimrod, MD   50,000 Units at 10/23/20 1538   Past Medical History:  Diagnosis Date  . (HFpEF) heart failure with preserved ejection fraction ([place])   . CKD (chronic kidney disease), stage IV ([place])   . Hypertension   . Morbid obesity ([place])    Past Surgical History:  Procedure Laterality Date  . CESAREAN SECTION    . TUBAL LIGATION     Social History Social History   Tobacco Use  . Smoking status: Never Smoker  . Smokeless tobacco: Never Used  Vaping Use  . Vaping Use: Never used  Substance Use Topics  . Alcohol use: No  . Drug use: No   Family History Family History  Problem Relation Age of Onset  . Epilepsy Mother   . Hypertension Mother   . Hypertension Father   Denies family history of peripheral disease, venous disease or renal disease.  No Known Allergies  REVIEW OF SYSTEMS (Negative unless checked)  Constitutional: [] Weight loss  [] Fever  [] Chills Cardiac: [] Chest pain   [] Chest pressure   [] Palpitations   [x] Shortness of breath when laying flat   [x] Shortness of breath at rest   [x] Shortness of breath with exertion. Vascular:  [] Pain in legs with walking   [] Pain  in legs at rest   [] Pain in legs when laying flat   [] Claudication   [] Pain in feet when walking  [] Pain in feet at rest  [] Pain in feet when laying flat   [] History of DVT   [] Phlebitis   [x] Swelling in legs   [] Varicose veins   [] Non-healing ulcers Pulmonary:   [] Uses home oxygen   [] Productive cough   [] Hemoptysis   [] Wheeze  [] COPD   [] Asthma Neurologic:  [] Dizziness  [] Blackouts   [] Seizures   [] History of stroke   [] History of TIA  [] Aphasia   [] Temporary blindness   [] Dysphagia   [] Weakness or numbness in arms   [] Weakness or numbness in legs Musculoskeletal:  [] Arthritis   [] Joint swelling   [] Joint pain   [] Low back pain Hematologic:  [] Easy bruising  [] Easy bleeding   [] Hypercoagulable state   [] Anemic  [] Hepatitis Gastrointestinal:  [] Blood in stool   [] Vomiting blood  [] Gastroesophageal reflux/heartburn   [] Difficulty swallowing. Genitourinary:  [x] Chronic kidney disease   [] Difficult urination  [] Frequent urination  [] Burning with urination   [] Blood in urine Skin:  [] Rashes   [] Ulcers   [] Wounds Psychological:  [] History of anxiety   []  History of major depression.  Physical Examination  Vitals:   10/24/20 0325 10/24/20 0915 10/24/20 1203 10/24/20 1335  BP: 136/90 132/79 (!) 152/89 (!) 161/94  Pulse: 62 63 62 60  Resp: 19 19 16 14   Temp: 98 F (36.7 C) 98.1 F (36.7 C) 97.9 F (36.6 C) 98 F (36.7 C)  TempSrc: Oral Oral Oral  Oral  SpO2: 97% 98% 100% 99%  Weight:      Height:       Body mass index is 44.08 kg/m. Gen:  WD/WN, NAD Head: [place]/AT, No temporalis wasting. Prominent temp pulse not noted. Ear/Nose/Throat: Hearing grossly intact, nares w/o erythema or drainage, oropharynx w/o Erythema/Exudate Eyes: Sclera non-icteric, conjunctiva clear Neck: Trachea midline.  No JVD.  Pulmonary:  Good air movement, respirations not labored, equal bilaterally.  Cardiac: RRR, normal S1, S2. Vascular:  Vessel Right Left  Radial Palpable Palpable  Ulnar Palpable Palpable   Brachial Palpable Palpable  Carotid Palpable, without bruit Palpable, without bruit  Aorta Not palpable N/A  Femoral Palpable Palpable  Popliteal Palpable Palpable  PT Palpable Palpable  DP Palpable Palpable   Gastrointestinal: soft, non-tender/non-distended. No guarding/reflex.  Musculoskeletal: M/S 5/5 throughout.  Extremities without ischemic changes.  No deformity or atrophy. Moderate edema. Neurologic: Sensation grossly intact in extremities.  Symmetrical.  Speech is fluent. Motor exam as listed above. Psychiatric: Judgment intact, Mood & affect appropriate for pt's clinical situation. Dermatologic: No rashes or ulcers noted.  No cellulitis or open wounds. Lymph : No Cervical, Axillary, or Inguinal lymphadenopathy.  CBC Lab Results  Component Value Date   WBC 7.8 10/24/2020   HGB 8.0 (L) 10/24/2020   HCT 25.8 (L) 10/24/2020   MCV 75.2 (L) 10/24/2020   PLT 282 10/24/2020   BMET    Component Value Date/Time   NA 138 10/24/2020 0556   K 3.5 10/24/2020 0556   CL 106 10/24/2020 0556   CO2 14 (L) 10/24/2020 0556   GLUCOSE 82 10/24/2020 0556   BUN 160 (H) 10/24/2020 0556   BUN 30 (A) 04/11/2015 0000   CREATININE 14.45 (H) 10/24/2020 0556   CALCIUM 6.6 (L) 10/24/2020 0556   GFRNONAA 3 (L) 10/24/2020 0556   GFRAA 12 (L) 01/23/2020 0249   Estimated Creatinine Clearance: 6.4 mL/min (A) (by C-G formula based on SCr of 14.45 mg/dL (H)).  COAG Lab Results  Component Value Date   INR 1.3 (H) 10/24/2020   INR 1.3 (H) 10/21/2020   INR 0.9 01/18/2020   Radiology DG Chest 2 View  Result Date: 10/20/2020 CLINICAL DATA:  Chest pain and lower extremity swelling. EXAM: CHEST - 2 VIEW COMPARISON:  January 20, 2020 FINDINGS: Very mildly increased interstitial lung markings are noted, bilaterally. Mild atelectasis and/or infiltrate is seen within the right lung base. There is a small right pleural effusion. No pneumothorax is identified. The cardiac silhouette is markedly enlarged.  The visualized skeletal structures are unremarkable. IMPRESSION: 1. Cardiomegaly with very mild interstitial edema. 2. Mild right basilar atelectasis and/or infiltrate. 3. Small right pleural effusion. Electronically Signed   By: Virgina Norfolk M.D.   On: 10/20/2020 21:49   US RENAL  Result Date: 10/21/2020 CLINICAL DATA:  Acute renal failure EXAM: RENAL / URINARY TRACT ULTRASOUND COMPLETE COMPARISON:  Ultrasound renal 08/26/2019 FINDINGS: Right Kidney: Renal measurements: 9.1 x 4.2 x 4.3 cm = volume: 85 mL. Increased echogenicity of the renal cortex diffusely. No mass or obstruction Left Kidney: Renal measurements: 8.1 x 3.8 x 3.9 cm = volume: 62 mL. Diffusely increased echogenicity of the renal cortex. No mass or obstruction. Bladder: Not visualized.  Patient has a catheter. Other: Ascites around the liver. IMPRESSION: Small kidneys with diffusely increased echogenicity of the renal cortex. No obstruction. Electronically Signed   By: Franchot Gallo M.D.   On: 10/21/2020 16:21   Assessment/Plan Renee [name] is a 44 year old female with medical  history significant of hypertension, hyperlipidemia, diabetes mellitus, CKD stage IV, dCHF, morbid obesity, who presents with worsening leg edema, shortness breath, chest pain, increased urinary frequency.  The patient's kidney function has deteriorated and nephrology would like to initiate dialysis.  1.  Acute on chronic renal failure: Patient with known history of chronic kidney disease which is now progressed.  Nephrology would like to initiate dialysis at this time however the patient does not have an adequate access.  Recommend placing a PermCath to allow the patient to dialyze in the inpatient outpatient setting while a more formal access is created.  Procedure, risks and benefits were explained to the patient.  All questions were answered.  Patient wishes to proceed.  2.  Hypertension: Encouraged good control as its slows the progression of  atherosclerotic / renal disease  3.  Diabetes: Encouraged good control as its slows the progression of atherosclerotic / renal disease.  Discussed with Dr. Mayme Genta, PA-C  10/24/2020 1:44 PM  This note was created with Dragon medical transcription system.  Any error is purely unintentional

## 2020-10-24 NOTE — Care Management Important Message (Signed)
Important Message  Patient Details  Name: Renee [name] MRN: [id number] Date of Birth: 08/01/1976   Medicare Important Message Given:  Yes     Dannette Barbara 10/24/2020, 12:09 PM

## 2020-10-24 NOTE — Plan of Care (Signed)
  Problem: Clinical Measurements: Goal: Ability to maintain clinical measurements within normal limits will improve Outcome: Progressing   Problem: Clinical Measurements: Goal: Respiratory complications will improve Outcome: Progressing   Problem: Safety: Goal: Ability to remain free from injury will improve Outcome: Progressing

## 2020-10-24 NOTE — Op Note (Signed)
OPERATIVE NOTE    PRE-OPERATIVE DIAGNOSIS: 1. ESRD   POST-OPERATIVE DIAGNOSIS: same as above  PROCEDURE: 1. Ultrasound guidance for vascular access to the right internal jugular vein 2. Fluoroscopic guidance for placement of catheter 3. Placement of a 19 cm tip to cuff tunneled hemodialysis catheter via the right internal jugular vein  SURGEON: [name], MD  ANESTHESIA:  Local with Moderate conscious sedation for approximately 20 minutes using 3 mg of Versed and 75 mcg of Fentanyl  ESTIMATED BLOOD LOSS: 25 cc  FLUORO TIME: less than one minute  CONTRAST: none  FINDING(S): 1.  Patent right internal jugular vein  SPECIMEN(S):  None  INDICATIONS:   Renee [name] is a 44 y.o.female who presents with renal failure and volume overload.  The patient needs long term dialysis access for their ESRD, and a Permcath is necessary.  Risks and benefits are discussed and informed consent is obtained.    DESCRIPTION: After obtaining full informed written consent, the patient was brought back to the vascular suited. The patient's right neck and chest were sterilely prepped and draped in a sterile surgical field was created. Moderate conscious sedation was administered during a face to face encounter with the patient throughout the procedure with my supervision of the RN administering medicines and monitoring the patient's vital signs, pulse oximetry, telemetry and mental status throughout from the start of the procedure until the patient was taken to the recovery room.  The right internal jugular vein was visualized with ultrasound and found to be patent. It was then accessed under direct ultrasound guidance and a permanent image was recorded. A wire was placed. After skin nick and dilatation, the peel-away sheath was placed over the wire. I then turned my attention to an area under the clavicle. Approximately 1-2 fingerbreadths below the clavicle a small counterincision was created and tunneled  from the subclavicular incision to the access site. Using fluoroscopic guidance, a 19 centimeter tip to cuff tunneled hemodialysis catheter was selected, and tunneled from the subclavicular incision to the access site. It was then placed through the peel-away sheath and the peel-away sheath was removed. Using fluoroscopic guidance the catheter tips were parked in the right atrium. The appropriate distal connectors were placed. It withdrew blood well and flushed easily with heparinized saline and a concentrated heparin solution was then placed. It was secured to the chest wall with 2 Prolene sutures. The access incision was closed single 4-0 Monocryl. A 4-0 Monocryl pursestring suture was placed around the exit site. Sterile dressings were placed. The patient tolerated the procedure well and was taken to the recovery room in stable condition.  COMPLICATIONS: None  CONDITION: Stable  [name], MD 10/24/2020 2:38 PM   This note was created with Dragon Medical transcription system. Any errors in dictation are purely unintentional.

## 2020-10-24 NOTE — H&P (View-Only) (Signed)
Renee [name]  MRN : [id number]  Renee [name] is a 44 y.o. (1976/12/17) female who presents with chief complaint of  Chief Complaint  Patient presents with  . Hypertension  . Shortness of Breath   History of Present Illness:  Renee [name] is a 44 year old female with medical history significant of hypertension, hyperlipidemia, diabetes mellitus, CKD stage IV, dCHF, morbid obesity, who presents with worsening leg edema, shortness breath, chest pain, increased urinary frequency.  Patient states that he has not been taking her medications for more than one month.  She developed worsening bilateral leg edema and shortness of breath, which has been progressively worsening in the past several days.  She also has chest pain, which is located in the front chest, dull, mild, pressure-like, nonradiating.  Patient has dry cough, but no fever or chills.  She has had increased urinary frequency, but denies dysuria or burning on urination.  Patient denies abdominal pain, nausea, vomiting, diarrhea.  No unilateral weakness or numbness.  The patient was also seen by nephrology who wanted to initiate dialysis however the patient was hesitant at first.  After multiple discussions with her primary team and nephrology the patient has decided to move forward with the initiation of dialysis.  The patient does not have adequate dialysis access at this time so vascular surgery was consulted by NP Raju for placement of a PermCath.  Current Facility-Administered Medications  Medication Dose Route Frequency Provider Last Rate Last Admin  . [MAR Hold] 0.9 %  sodium chloride infusion  100 mL Intravenous PRN Bhutani, Manpreet S, MD      . Doug Sou Hold] 0.9 %  sodium chloride infusion  100 mL Intravenous PRN Bhutani, Manpreet S, MD      . 0.9 %  sodium chloride infusion   Intravenous Continuous Ovella Manygoats, Dalphine A, PA-C      . [MAR Hold] acetaminophen (TYLENOL) tablet 650 mg   650 mg Oral Q6H PRN Ivor Costa, MD   650 mg at 10/22/20 0849  . [MAR Hold] albuterol (VENTOLIN HFA) 108 (90 Base) MCG/ACT inhaler 2 puff  2 puff Inhalation Q4H PRN Ivor Costa, MD      . Doug Sou Hold] alteplase (CATHFLO ACTIVASE) injection 2 mg  2 mg Intracatheter Once PRN Liana Gerold, MD      . Doug Sou Hold] amLODipine (NORVASC) tablet 10 mg  10 mg Oral Daily Ivor Costa, MD   10 mg at 10/24/20 1221  . [MAR Hold] aspirin chewable tablet 81 mg  81 mg Oral Daily Ivor Costa, MD   81 mg at 10/24/20 1221  . [MAR Hold] calcitRIOL (ROCALTROL) capsule 0.25 mcg  0.25 mcg Oral Once per day on Mon Wed Sat Ivor Costa, MD   0.25 mcg at 10/24/20 1223  . [MAR Hold] calcium carbonate (OS-CAL - dosed in mg of elemental calcium) tablet 1,250 mg  1,250 mg Oral BID WC Lorella Nimrod, MD      . Doug Sou Hold] calcium carbonate (TUMS - dosed in mg elemental calcium) chewable tablet 200 mg of elemental calcium  1 tablet Oral QID PRN Lorella Nimrod, MD   200 mg of elemental calcium at 10/23/20 2217  . [MAR Hold] carvedilol (COREG) tablet 25 mg  25 mg Oral BID WC Ivor Costa, MD   25 mg at 10/23/20 1653  . [START ON 10/25/2020] ceFAZolin (ANCEF) IVPB 1 g/50 mL premix  1 g Intravenous Once Hollyanne Schloesser, Kyani A, PA-C      . [  MAR Hold] cephALEXin (KEFLEX) capsule 250 mg  250 mg Oral Q12H Lorella Nimrod, MD   250 mg at 10/24/20 1222  . [MAR Hold] Chlorhexidine Gluconate Cloth 2 % PADS 6 each  6 each Topical Q0600 Liana Gerold, MD   6 each at 10/24/20 0608  . [MAR Hold] cloNIDine (CATAPRES) tablet 0.2 mg  0.2 mg Oral TID Ivor Costa, MD   0.2 mg at 10/24/20 1221  . [MAR Hold] dextromethorphan-guaiFENesin (MUCINEX DM) 30-600 MG per 12 hr tablet 1 tablet  1 tablet Oral BID PRN Ivor Costa, MD      . diphenhydrAMINE (BENADRYL) injection 50 mg  50 mg Intravenous Once PRN Ashleigh Luckow, Janalyn Harder, PA-C      . [MAR Hold] epoetin alfa (EPOGEN) injection 4,000 Units  4,000 Units Subcutaneous Q7 days Liana Gerold, MD   4,000 Units at  10/23/20 1030  . famotidine (PEPCID) tablet 40 mg  40 mg Oral Once PRN Fields Oros, Janalyn Harder, PA-C      . [MAR Hold] ferumoxytol (FERAHEME) 510 mg in sodium chloride 0.9 % 100 mL IVPB  510 mg Intravenous Weekly Bhutani, Manpreet S, MD      . furosemide (LASIX) 250 mg in dextrose 5 % 250 mL (1 mg/mL) infusion  8 mg/hr Intravenous Continuous Ivor Costa, MD 8 mL/hr at 10/24/20 0158 8 mg/hr at 10/24/20 0158  . [MAR Hold] heparin injection 1,000 Units  1,000 Units Dialysis PRN Liana Gerold, MD      . Doug Sou Hold] hydrALAZINE (APRESOLINE) injection 5 mg  5 mg Intravenous Q2H PRN Ivor Costa, MD   5 mg at 10/21/20 1751  . [MAR Hold] hydrALAZINE (APRESOLINE) tablet 50 mg  50 mg Oral TID Ivor Costa, MD   50 mg at 10/24/20 1222  . [MAR Hold] HYDROmorphone (DILAUDID) injection 1 mg  1 mg Intravenous Once PRN Audreyana Huntsberry, Janalyn Harder, PA-C      . [MAR Hold] insulin aspart (novoLOG) injection 0-5 Units  0-5 Units Subcutaneous QHS Ivor Costa, MD      . Doug Sou Hold] insulin aspart (novoLOG) injection 0-9 Units  0-9 Units Subcutaneous TID WC Ivor Costa, MD      . Doug Sou Hold] insulin glargine (LANTUS) injection 13 Units  13 Units Subcutaneous Daily Lorella Nimrod, MD      . Doug Sou Hold] isosorbide mononitrate (IMDUR) 24 hr tablet 30 mg  30 mg Oral Daily Ivor Costa, MD   30 mg at 10/24/20 1223  . [MAR Hold] levETIRAcetam (KEPPRA) tablet 500 mg  500 mg Oral BID Ivor Costa, MD   500 mg at 10/24/20 1222  . [MAR Hold] lidocaine (PF) (XYLOCAINE) 1 % injection 5 mL  5 mL Intradermal PRN Bhutani, Lavina Hamman, MD      . Doug Sou Hold] lidocaine-prilocaine (EMLA) cream 1 application  1 application Topical PRN Bhutani, Manpreet S, MD      . methylPREDNISolone sodium succinate (SOLU-MEDROL) 125 mg/2 mL injection 125 mg  125 mg Intravenous Once PRN Karyme Mcconathy, Sarann A, PA-C      . midazolam (VERSED) 2 MG/ML syrup 8 mg  8 mg Oral Once PRN Rabon Scholle, Janalyn Harder, PA-C      . [MAR Hold] ondansetron (ZOFRAN) injection 4 mg  4 mg Intravenous  Q8H PRN Ivor Costa, MD      . Doug Sou Hold] ondansetron [facility]) injection 4 mg  4 mg Intravenous Q6H PRN Kurtis Anastasia, Janalyn Harder, PA-C      . [MAR Hold] pentafluoroprop-tetrafluoroeth (GEBAUERS) aerosol 1 application  1 application Topical  PRN Bhutani, Manpreet S, MD      . pneumococcal 23 valent vaccine (PNEUMOVAX-23) injection 0.5 mL  0.5 mL Intramuscular Tomorrow-1000 Ivor Costa, MD      . Doug Sou Hold] pravastatin (PRAVACHOL) tablet 20 mg  20 mg Oral q1800 Ivor Costa, MD   20 mg at 10/23/20 1653  . [MAR Hold] sodium bicarbonate tablet 650 mg  650 mg Oral TID Ivor Costa, MD   650 mg at 10/24/20 1222  . tuberculin injection 5 Units  5 Units Intradermal Once Liana Gerold, MD   5 Units at 10/23/20 1539  . [MAR Hold] Vitamin D (Ergocalciferol) (DRISDOL) capsule 50,000 Units  50,000 Units Oral Q7 days Lorella Nimrod, MD   50,000 Units at 10/23/20 1538   Past Medical History:  Diagnosis Date  . (HFpEF) heart failure with preserved ejection fraction ([place])   . CKD (chronic kidney disease), stage IV ([place])   . Hypertension   . Morbid obesity ([place])    Past Surgical History:  Procedure Laterality Date  . CESAREAN SECTION    . TUBAL LIGATION     Social History Social History   Tobacco Use  . Smoking status: Never Smoker  . Smokeless tobacco: Never Used  Vaping Use  . Vaping Use: Never used  Substance Use Topics  . Alcohol use: No  . Drug use: No   Family History Family History  Problem Relation Age of Onset  . Epilepsy Mother   . Hypertension Mother   . Hypertension Father   Denies family history of peripheral disease, venous disease or renal disease.  No Known Allergies  REVIEW OF SYSTEMS (Negative unless checked)  Constitutional: [] Weight loss  [] Fever  [] Chills Cardiac: [] Chest pain   [] Chest pressure   [] Palpitations   [x] Shortness of breath when laying flat   [x] Shortness of breath at rest   [x] Shortness of breath with exertion. Vascular:  [] Pain in legs with walking   [] Pain  in legs at rest   [] Pain in legs when laying flat   [] Claudication   [] Pain in feet when walking  [] Pain in feet at rest  [] Pain in feet when laying flat   [] History of DVT   [] Phlebitis   [x] Swelling in legs   [] Varicose veins   [] Non-healing ulcers Pulmonary:   [] Uses home oxygen   [] Productive cough   [] Hemoptysis   [] Wheeze  [] COPD   [] Asthma Neurologic:  [] Dizziness  [] Blackouts   [] Seizures   [] History of stroke   [] History of TIA  [] Aphasia   [] Temporary blindness   [] Dysphagia   [] Weakness or numbness in arms   [] Weakness or numbness in legs Musculoskeletal:  [] Arthritis   [] Joint swelling   [] Joint pain   [] Low back pain Hematologic:  [] Easy bruising  [] Easy bleeding   [] Hypercoagulable state   [] Anemic  [] Hepatitis Gastrointestinal:  [] Blood in stool   [] Vomiting blood  [] Gastroesophageal reflux/heartburn   [] Difficulty swallowing. Genitourinary:  [x] Chronic kidney disease   [] Difficult urination  [] Frequent urination  [] Burning with urination   [] Blood in urine Skin:  [] Rashes   [] Ulcers   [] Wounds Psychological:  [] History of anxiety   []  History of major depression.  Physical Examination  Vitals:   10/24/20 0325 10/24/20 0915 10/24/20 1203 10/24/20 1335  BP: 136/90 132/79 (!) 152/89 (!) 161/94  Pulse: 62 63 62 60  Resp: 19 19 16 14   Temp: 98 F (36.7 C) 98.1 F (36.7 C) 97.9 F (36.6 C) 98 F (36.7 C)  TempSrc: Oral Oral Oral  Oral  SpO2: 97% 98% 100% 99%  Weight:      Height:       Body mass index is 44.08 kg/m. Gen:  WD/WN, NAD Head: [place]/AT, No temporalis wasting. Prominent temp pulse not noted. Ear/Nose/Throat: Hearing grossly intact, nares w/o erythema or drainage, oropharynx w/o Erythema/Exudate Eyes: Sclera non-icteric, conjunctiva clear Neck: Trachea midline.  No JVD.  Pulmonary:  Good air movement, respirations not labored, equal bilaterally.  Cardiac: RRR, normal S1, S2. Vascular:  Vessel Right Left  Radial Palpable Palpable  Ulnar Palpable Palpable   Brachial Palpable Palpable  Carotid Palpable, without bruit Palpable, without bruit  Aorta Not palpable N/A  Femoral Palpable Palpable  Popliteal Palpable Palpable  PT Palpable Palpable  DP Palpable Palpable   Gastrointestinal: soft, non-tender/non-distended. No guarding/reflex.  Musculoskeletal: M/S 5/5 throughout.  Extremities without ischemic changes.  No deformity or atrophy. Moderate edema. Neurologic: Sensation grossly intact in extremities.  Symmetrical.  Speech is fluent. Motor exam as listed above. Psychiatric: Judgment intact, Mood & affect appropriate for pt's clinical situation. Dermatologic: No rashes or ulcers noted.  No cellulitis or open wounds. Lymph : No Cervical, Axillary, or Inguinal lymphadenopathy.  CBC Lab Results  Component Value Date   WBC 7.8 10/24/2020   HGB 8.0 (L) 10/24/2020   HCT 25.8 (L) 10/24/2020   MCV 75.2 (L) 10/24/2020   PLT 282 10/24/2020   BMET    Component Value Date/Time   NA 138 10/24/2020 0556   K 3.5 10/24/2020 0556   CL 106 10/24/2020 0556   CO2 14 (L) 10/24/2020 0556   GLUCOSE 82 10/24/2020 0556   BUN 160 (H) 10/24/2020 0556   BUN 30 (A) 04/11/2015 0000   CREATININE 14.45 (H) 10/24/2020 0556   CALCIUM 6.6 (L) 10/24/2020 0556   GFRNONAA 3 (L) 10/24/2020 0556   GFRAA 12 (L) 01/23/2020 0249   Estimated Creatinine Clearance: 6.4 mL/min (A) (by C-G formula based on SCr of 14.45 mg/dL (H)).  COAG Lab Results  Component Value Date   INR 1.3 (H) 10/24/2020   INR 1.3 (H) 10/21/2020   INR 0.9 01/18/2020   Radiology DG Chest 2 View  Result Date: 10/20/2020 CLINICAL DATA:  Chest pain and lower extremity swelling. EXAM: CHEST - 2 VIEW COMPARISON:  January 20, 2020 FINDINGS: Very mildly increased interstitial lung markings are noted, bilaterally. Mild atelectasis and/or infiltrate is seen within the right lung base. There is a small right pleural effusion. No pneumothorax is identified. The cardiac silhouette is markedly enlarged.  The visualized skeletal structures are unremarkable. IMPRESSION: 1. Cardiomegaly with very mild interstitial edema. 2. Mild right basilar atelectasis and/or infiltrate. 3. Small right pleural effusion. Electronically Signed   By: Virgina Norfolk M.D.   On: 10/20/2020 21:49   US RENAL  Result Date: 10/21/2020 CLINICAL DATA:  Acute renal failure EXAM: RENAL / URINARY TRACT ULTRASOUND COMPLETE COMPARISON:  Ultrasound renal 08/26/2019 FINDINGS: Right Kidney: Renal measurements: 9.1 x 4.2 x 4.3 cm = volume: 85 mL. Increased echogenicity of the renal cortex diffusely. No mass or obstruction Left Kidney: Renal measurements: 8.1 x 3.8 x 3.9 cm = volume: 62 mL. Diffusely increased echogenicity of the renal cortex. No mass or obstruction. Bladder: Not visualized.  Patient has a catheter. Other: Ascites around the liver. IMPRESSION: Small kidneys with diffusely increased echogenicity of the renal cortex. No obstruction. Electronically Signed   By: Franchot Gallo M.D.   On: 10/21/2020 16:21   Assessment/Plan Renee [name] is a 44 year old female with medical  history significant of hypertension, hyperlipidemia, diabetes mellitus, CKD stage IV, dCHF, morbid obesity, who presents with worsening leg edema, shortness breath, chest pain, increased urinary frequency.  The patient's kidney function has deteriorated and nephrology would like to initiate dialysis.  1.  Acute on chronic renal failure: Patient with known history of chronic kidney disease which is now progressed.  Nephrology would like to initiate dialysis at this time however the patient does not have an adequate access.  Recommend placing a PermCath to allow the patient to dialyze in the inpatient outpatient setting while a more formal access is created.  Procedure, risks and benefits were explained to the patient.  All questions were answered.  Patient wishes to proceed.  2.  Hypertension: Encouraged good control as its slows the progression of  atherosclerotic / renal disease  3.  Diabetes: Encouraged good control as its slows the progression of atherosclerotic / renal disease.  Discussed with Dr. Mayme Genta, PA-C  10/24/2020 1:44 PM  This [name] was created with Dragon medical transcription system.  Any error is purely unintentional

## 2020-10-24 NOTE — Progress Notes (Signed)
[facility]  ROUNDING NOTE   Subjective:   [name] is a 44 years old female with chronic kidney disease, hypertension, obesity, heart failure with preserved ejection fraction, presented to the emergency room on 10/20/2020 with worsening shortness of breath and bilateral lower extremity edema.  Patient found resting in bed, awake,alert, husband at bedside. Patient is for dialysis today after access placement.She is NPO.   Objective:  Vital signs in last 24 hours:  Temp:  [97.3 F (36.3 C)-98.1 F (36.7 C)] 98.1 F (36.7 C) (10/25 0915) Pulse Rate:  [54-63] 63 (10/25 0915) Resp:  [16-19] 19 (10/25 0915) BP: (121-141)/(72-91) 132/79 (10/25 0915) SpO2:  [96 %-100 %] 98 % (10/25 0915) Weight:  [120.2 kg] 120.2 kg (10/25 0325)  Weight change: 4.858 kg Filed Weights   10/22/20 0508 10/23/20 0341 10/24/20 0325  Weight: 115.8 kg 115.3 kg 120.2 kg    Intake/Output: I/O last 3 completed shifts: In: 1319.8 [P.O.:840; I.V.:179.8; Blood:300] Out: 1130 [Urine:1130]   Intake/Output this shift:  No intake/output data recorded.  Physical Exam: General: Resting in bed,in no acute distress  Head: Normocephalic, atraumatic. Moist oral mucosal membranes  Eyes: Anicteric  Lungs:   Respirations even,Fine crackles + bilaterally, able to talk in full sentences  Heart: S1S2,Regular rate and rhythm  Abdomen:  Soft, non tender,non distended  Extremities:  1+ peripheral edema.  Neurologic:  Oriented, moving all four extremities  Skin: No acute lesions or rashes  Access:  To be established today    Basic Metabolic Panel: Recent Labs  Lab 10/20/20 2119 10/20/20 2119 10/21/20 0458 10/21/20 0458 10/22/20 0036 10/23/20 0455 10/24/20 0556  NA 135  --  136  --  135 136 138  K 3.4*  --  3.3*  --  3.5 3.4* 3.5  CL 102  --  103  --  103 104 106  CO2 12*  --  13*  --  14* 15* 14*  GLUCOSE 171*  --  138*  --  134* 93 82  BUN 146*  --  153*  --  156* 155* 160*  CREATININE 13.49*   --  13.63*  --  14.22* 14.19* 14.45*  CALCIUM 6.9*   < > 6.9*   < > 6.6* 6.3* 6.6*  MG  --   --  2.6*  --  2.4  --   --   PHOS  --   --  9.7*  --   --  10.9* 11.3*   < > = values in this interval not displayed.    Liver Function Tests: Recent Labs  Lab 10/22/20 0816 10/23/20 0455 10/24/20 0556  AST 14*  --   --   ALT 32  --   --   ALKPHOS 116  --   --   BILITOT 0.5  --   --   PROT 6.2*  --   --   ALBUMIN 2.9* 2.9* 2.9*   No results for input(s): LIPASE, AMYLASE in the last 168 hours. No results for input(s): AMMONIA in the last 168 hours.  CBC: Recent Labs  Lab 10/20/20 2119 10/22/20 0036 10/22/20 0816 10/23/20 0455 10/24/20 0557  WBC 11.3* 7.3 6.0 6.3 7.8  HGB 8.0* 6.9* 6.8* 6.6* 8.0*  HCT 26.1* 22.7* 21.8* 22.1* 25.8*  MCV 75.2* 75.7* 74.1* 75.9* 75.2*  PLT 348 272 248 259 282    Cardiac Enzymes: No results for input(s): CKTOTAL, CKMB, CKMBINDEX, TROPONINI in the last 168 hours.  BNP: Invalid input(s): POCBNP  CBG: Recent  Labs  Lab 10/23/20 0803 10/23/20 1317 10/23/20 1641 10/23/20 2118 10/24/20 0912  GLUCAP 90 107* 115* 135* 102*    Microbiology: Results for orders placed or performed during the hospital encounter of 10/21/20  Respiratory Panel by RT PCR (Flu A&B, Covid) - Nasopharyngeal Swab     Status: None   Collection Time: 10/21/20  4:58 AM   Specimen: Nasopharyngeal Swab  Result Value Ref Range Status   SARS Coronavirus 2 by RT PCR NEGATIVE NEGATIVE Final    Comment: (NOTE) SARS-CoV-2 target nucleic acids are NOT DETECTED.  The SARS-CoV-2 RNA is generally detectable in upper respiratoy specimens during the acute phase of infection. The lowest concentration of SARS-CoV-2 viral copies this assay can detect is 131 copies/mL. A negative result does not preclude SARS-Cov-2 infection and should not be used as the sole basis for treatment or other patient management decisions. A negative result may occur with  improper specimen  collection/handling, submission of specimen other than nasopharyngeal swab, presence of viral mutation(s) within the areas targeted by this assay, and inadequate number of viral copies (<131 copies/mL). A negative result must be combined with clinical observations, patient history, and epidemiological information. The expected result is Negative.  Fact Sheet for Patients:  PinkCheek.be  Fact Sheet for Healthcare Providers:  GravelBags.it  This test is no t yet approved or cleared by the [place] FDA and  has been authorized for detection and/or diagnosis of SARS-CoV-2 by FDA under an Emergency Use Authorization (EUA). This EUA will remain  in effect (meaning this test can be used) for the duration of the COVID-19 declaration under Section 564(b)(1) of the Act, 21 U.S.C. section 360bbb-3(b)(1), unless the authorization is terminated or revoked sooner.     Influenza A by PCR NEGATIVE NEGATIVE Final   Influenza B by PCR NEGATIVE NEGATIVE Final    Comment: (NOTE) The Xpert Xpress SARS-CoV-2/FLU/RSV assay is intended as an aid in  the diagnosis of influenza from Nasopharyngeal swab specimens and  should not be used as a sole basis for treatment. Nasal washings and  aspirates are unacceptable for Xpert Xpress SARS-CoV-2/FLU/RSV  testing.  Fact Sheet for Patients: PinkCheek.be  Fact Sheet for Healthcare Providers: GravelBags.it  This test is not yet approved or cleared by the [place] FDA and  has been authorized for detection and/or diagnosis of SARS-CoV-2 by  FDA under an Emergency Use Authorization (EUA). This EUA will remain  in effect (meaning this test can be used) for the duration of the  Covid-19 declaration under Section 564(b)(1) of the Act, 21  U.S.C. section 360bbb-3(b)(1), unless the authorization is  terminated or revoked. Performed at [facility], [address] [place], [place] [address]   Urine Culture     Status: Abnormal   Collection Time: 10/21/20  4:58 AM   Specimen: Urine, Random  Result Value Ref Range Status   Specimen Description   Final    URINE, RANDOM Performed at [facility], [address] [place], [place] [address]    Special Requests   Final    Normal Performed at [facility], [address] [place], [place] [address]    Culture >=100,000 COLONIES/mL ESCHERICHIA COLI (A)  Final   Report Status 10/23/2020 FINAL  Final   Organism ID, Bacteria ESCHERICHIA COLI (A)  Final      Susceptibility   Escherichia coli - MIC*    AMPICILLIN >=32 RESISTANT Resistant     CEFAZOLIN <=4 SENSITIVE Sensitive  CEFTRIAXONE <=0.25 SENSITIVE Sensitive     CIPROFLOXACIN <=0.25 SENSITIVE Sensitive     GENTAMICIN <=1 SENSITIVE Sensitive     IMIPENEM <=0.25 SENSITIVE Sensitive     NITROFURANTOIN <=16 SENSITIVE Sensitive     TRIMETH/SULFA <=20 SENSITIVE Sensitive     AMPICILLIN/SULBACTAM 16 INTERMEDIATE Intermediate     PIP/TAZO <=4 SENSITIVE Sensitive     * >=100,000 COLONIES/mL ESCHERICHIA COLI  CULTURE, BLOOD (ROUTINE X 2) w Reflex to ID Panel     Status: None (Preliminary result)   Collection Time: 10/21/20 11:36 AM   Specimen: BLOOD  Result Value Ref Range Status   Specimen Description BLOOD BLOOD LEFT HAND  Final   Special Requests   Final    BOTTLES DRAWN AEROBIC AND ANAEROBIC Blood Culture adequate volume   Culture   Final    NO GROWTH 3 DAYS Performed at [facility], [address], [place], [place] [address]    Report Status PENDING  Incomplete  CULTURE, BLOOD (ROUTINE X 2) w Reflex to ID Panel     Status: None (Preliminary result)   Collection Time: 10/21/20  4:10 PM   Specimen: BLOOD  Result Value Ref Range Status   Specimen Description BLOOD RIGHT ANTECUBITAL  Final   Special Requests   Final    BOTTLES DRAWN AEROBIC AND ANAEROBIC Blood Culture adequate  volume   Culture   Final    NO GROWTH 3 DAYS Performed at [facility], [address], [place], [place] [address]    Report Status PENDING  Incomplete    Coagulation Studies: Recent Labs    10/21/20 1145  LABPROT 15.3*  INR 1.3*    Urinalysis: No results for input(s): COLORURINE, LABSPEC, PHURINE, GLUCOSEU, HGBUR, BILIRUBINUR, KETONESUR, PROTEINUR, UROBILINOGEN, NITRITE, LEUKOCYTESUR in the last 72 hours.  Invalid input(s): APPERANCEUR    Imaging: No results found.   Medications:   . calcium gluconate    . [START ON 10/29/2020] ferumoxytol    . furosemide (LASIX) infusion 8 mg/hr (10/24/20 0158)   . amLODipine  10 mg Oral Daily  . aspirin  81 mg Oral Daily  . calcitRIOL  0.25 mcg Oral Once per day on Mon Wed Sat  . [START ON 10/25/2020] calcium carbonate  1,250 mg Oral BID WC  . carvedilol  25 mg Oral BID WC  . cephALEXin  250 mg Oral Q12H  . Chlorhexidine Gluconate Cloth  6 each Topical Q0600  . cloNIDine  0.2 mg Oral TID  . epoetin (EPOGEN/PROCRIT) injection  4,000 Units Subcutaneous Q7 days  . hydrALAZINE  50 mg Oral TID  . insulin aspart  0-5 Units Subcutaneous QHS  . insulin aspart  0-9 Units Subcutaneous TID WC  . insulin glargine  13 Units Subcutaneous BID  . isosorbide mononitrate  30 mg Oral Daily  . levETIRAcetam  500 mg Oral BID  . pneumococcal 23 valent vaccine  0.5 mL Intramuscular Tomorrow-1000  . pravastatin  20 mg Oral q1800  . sodium bicarbonate  650 mg Oral TID  . tuberculin  5 Units Intradermal Once  . Vitamin D (Ergocalciferol)  50,000 Units Oral Q7 days   acetaminophen, albuterol, calcium carbonate, dextromethorphan-guaiFENesin, hydrALAZINE, ondansetron (ZOFRAN) IV  Assessment/ Plan  [name] is a 44 y.o.  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 Associated with uncontrolled  hypertension Patient has progressively worsening chronic kidney disease Presented with SOB, orthopnea, dyspnea on exertion -Appears volume overloaded Baseline creatinine on 01/23/2020 4.90 GFR 12  Today, -Creatinine 14.45 -BUN 160 -GFR 3 -Ongoing discussions taken place with patient and spouse over the weekend regarding the importance of urgent dialysis -Patient willing to forward with the dialysis treatment today -Vascular consulted for catheter placement -Dialysis orders in  # Hypertensive Crisis # Heart failure with preserved ejection fraction Blood pressure readings on presentation to the ED was 240s over 120s Patient was not taking her blood pressure medicines for months as she was not able to afford it  -Blood pressure readings within acceptable range today -Continue current regimen of antihypertensives Patient currently on furosemide 8mg /hr  #Secondary hyperparathyroidism Phosphorus 11.3 Calcium 6.6 PTH 139 on 08/27/2019 Planning dialysis today On Calcitriol  #Anemia of chronic kidney disease Hemoglobin 8.0 On Epogen weekly now  #Diabetes Type 2 with CKD HbA1C 5.9 on 10/21/2020 Blood glucose readings within acceptable range Patient is on Insulin Aspart and Lantus     LOS: 3 Renee [name] 10/25/20219:16 AM

## 2020-10-24 NOTE — Progress Notes (Signed)
Inpatient Diabetes Program Recommendations  AACE/ADA: New Consensus Statement on Inpatient Glycemic Control (2015)  Target Ranges:  Prepandial:   less than 140 mg/dL      Peak postprandial:   less than 180 mg/dL (1-2 hours)      Critically ill patients:  140 - 180 mg/dL   Lab Results  Component Value Date   GLUCAP 91 10/24/2020   HGBA1C 5.9 (H) 10/21/2020    Review of Glycemic Control Results for ARGENTINA, KOSCH (MRN [id number]) as of 10/24/2020 11:08  Ref. Range 10/23/2020 16:41 10/23/2020 21:18 10/24/2020 09:12 10/24/2020 10:08  Glucose-Capillary Latest Ref Range: 70 - 99 mg/dL 115 (H) 135 (H) 69 (L) 91   Diabetes history: DM 2 Outpatient Diabetes medications:  Lantus 25 units bid, Novolog 0-9 units tid with meals Current orders for Inpatient glycemic control:  Lantus 13 units bid Novolog sensitive tid with meals and HS Inpatient Diabetes Program Recommendations:    Note low blood sugar this AM.  May consider reducing Lantus to 13 units q HS (instead of bid).    Thanks  Adah Perl, RN, BC-ADM Inpatient Diabetes Coordinator Pager [phone] (8a-5p)

## 2020-10-24 NOTE — Progress Notes (Signed)
[facility]  ROUNDING NOTE   Subjective:   Patient has agreed to undergo dialysis treatment.  She is willing to proceed with hemodialysis at this time and will consider peritoneal dialysis as well.  Objective:  Vital signs in last 24 hours:  Temp:  [97.5 F (36.4 C)-98.2 F (36.8 C)] 98.2 F (36.8 C) (10/25 1515) Pulse Rate:  [54-63] 61 (10/25 1515) Resp:  [14-28] 20 (10/25 1515) BP: (121-175)/(71-98) 139/74 (10/25 1645) SpO2:  [92 %-100 %] 100 % (10/25 1545) Weight:  [120.2 kg] 120.2 kg (10/25 0325)  Weight change: 4.858 kg Filed Weights   10/22/20 0508 10/23/20 0341 10/24/20 0325  Weight: 115.8 kg 115.3 kg 120.2 kg    Intake/Output: I/O last 3 completed shifts: In: 1319.8 [P.O.:840; I.V.:179.8; Blood:300] Out: 1130 [Urine:1130]   Intake/Output this shift:  No intake/output data recorded.  Physical Exam: General:  No acute distress  Head:   Normocephalic, atraumatic. Moist oral mucosal membranes  Eyes:  Anicteric  Lungs:   Normal effort  Heart:  Regular rate and rhythm, SR in the 80s to 90s  Abdomen:   Soft, nontender  Extremities:  1+ peripheral edema.  Neurologic:  Oriented, moving all four extremities  Skin:  No acute lesions or rashes  Access:  To be established    Basic Metabolic Panel: Recent Labs  Lab 10/20/20 2119 10/20/20 2119 10/21/20 0458 10/21/20 0458 10/22/20 0036 10/23/20 0455 10/24/20 0556  NA 135  --  136  --  135 136 138  K 3.4*  --  3.3*  --  3.5 3.4* 3.5  CL 102  --  103  --  103 104 106  CO2 12*  --  13*  --  14* 15* 14*  GLUCOSE 171*  --  138*  --  134* 93 82  BUN 146*  --  153*  --  156* 155* 160*  CREATININE 13.49*  --  13.63*  --  14.22* 14.19* 14.45*  CALCIUM 6.9*   < > 6.9*   < > 6.6* 6.3* 6.6*  MG  --   --  2.6*  --  2.4  --   --   PHOS  --   --  9.7*  --   --  10.9* 11.3*   < > = values in this interval not displayed.    Liver Function Tests: Recent Labs  Lab 10/22/20 0816 10/23/20 0455 10/24/20 0556   AST 14*  --   --   ALT 32  --   --   ALKPHOS 116  --   --   BILITOT 0.5  --   --   PROT 6.2*  --   --   ALBUMIN 2.9* 2.9* 2.9*   No results for input(s): LIPASE, AMYLASE in the last 168 hours. No results for input(s): AMMONIA in the last 168 hours.  CBC: Recent Labs  Lab 10/20/20 2119 10/22/20 0036 10/22/20 0816 10/23/20 0455 10/24/20 0557  WBC 11.3* 7.3 6.0 6.3 7.8  HGB 8.0* 6.9* 6.8* 6.6* 8.0*  HCT 26.1* 22.7* 21.8* 22.1* 25.8*  MCV 75.2* 75.7* 74.1* 75.9* 75.2*  PLT 348 272 248 259 282    Cardiac Enzymes: No results for input(s): CKTOTAL, CKMB, CKMBINDEX, TROPONINI in the last 168 hours.  BNP: Invalid input(s): POCBNP  CBG: Recent Labs  Lab 10/24/20 1205 10/24/20 1250 10/24/20 1342 10/24/20 1358 10/24/20 1455  GLUCAP 69* 106* 63* 178* 96    Microbiology: Results for orders placed or performed during the hospital encounter of  10/21/20  Respiratory Panel by RT PCR (Flu A&B, Covid) - Nasopharyngeal Swab     Status: None   Collection Time: 10/21/20  4:58 AM   Specimen: Nasopharyngeal Swab  Result Value Ref Range Status   SARS Coronavirus 2 by RT PCR NEGATIVE NEGATIVE Final    Comment: (NOTE) SARS-CoV-2 target nucleic acids are NOT DETECTED.  The SARS-CoV-2 RNA is generally detectable in upper respiratoy specimens during the acute phase of infection. The lowest concentration of SARS-CoV-2 viral copies this assay can detect is 131 copies/mL. A negative result does not preclude SARS-Cov-2 infection and should not be used as the sole basis for treatment or other patient management decisions. A negative result may occur with  improper specimen collection/handling, submission of specimen other than nasopharyngeal swab, presence of viral mutation(s) within the areas targeted by this assay, and inadequate number of viral copies (<131 copies/mL). A negative result must be combined with clinical observations, patient history, and epidemiological information.  The expected result is Negative.  Fact Sheet for Patients:  PinkCheek.be  Fact Sheet for Healthcare Providers:  GravelBags.it  This test is no t yet approved or cleared by the [place] FDA and  has been authorized for detection and/or diagnosis of SARS-CoV-2 by FDA under an Emergency Use Authorization (EUA). This EUA will remain  in effect (meaning this test can be used) for the duration of the COVID-19 declaration under Section 564(b)(1) of the Act, 21 U.S.C. section 360bbb-3(b)(1), unless the authorization is terminated or revoked sooner.     Influenza A by PCR NEGATIVE NEGATIVE Final   Influenza B by PCR NEGATIVE NEGATIVE Final    Comment: (NOTE) The Xpert Xpress SARS-CoV-2/FLU/RSV assay is intended as an aid in  the diagnosis of influenza from Nasopharyngeal swab specimens and  should not be used as a sole basis for treatment. Nasal washings and  aspirates are unacceptable for Xpert Xpress SARS-CoV-2/FLU/RSV  testing.  Fact Sheet for Patients: PinkCheek.be  Fact Sheet for Healthcare Providers: GravelBags.it  This test is not yet approved or cleared by the [place] FDA and  has been authorized for detection and/or diagnosis of SARS-CoV-2 by  FDA under an Emergency Use Authorization (EUA). This EUA will remain  in effect (meaning this test can be used) for the duration of the  Covid-19 declaration under Section 564(b)(1) of the Act, 21  U.S.C. section 360bbb-3(b)(1), unless the authorization is  terminated or revoked. Performed at [facility], [address] [place], [place] [address]   Urine Culture     Status: Abnormal   Collection Time: 10/21/20  4:58 AM   Specimen: Urine, Random  Result Value Ref Range Status   Specimen Description   Final    URINE, RANDOM Performed at [facility], [address], [place], [place]  [address]    Special Requests   Final    Normal Performed at [facility], [address] [place], [place] [address]    Culture >=100,000 COLONIES/mL ESCHERICHIA COLI (A)  Final   Report Status 10/23/2020 FINAL  Final   Organism ID, Bacteria ESCHERICHIA COLI (A)  Final      Susceptibility   Escherichia coli - MIC*    AMPICILLIN >=32 RESISTANT Resistant     CEFAZOLIN <=4 SENSITIVE Sensitive     CEFTRIAXONE <=0.25 SENSITIVE Sensitive     CIPROFLOXACIN <=0.25 SENSITIVE Sensitive     GENTAMICIN <=1 SENSITIVE Sensitive     IMIPENEM <=0.25 SENSITIVE Sensitive     NITROFURANTOIN <=16  SENSITIVE Sensitive     TRIMETH/SULFA <=20 SENSITIVE Sensitive     AMPICILLIN/SULBACTAM 16 INTERMEDIATE Intermediate     PIP/TAZO <=4 SENSITIVE Sensitive     * >=100,000 COLONIES/mL ESCHERICHIA COLI  CULTURE, BLOOD (ROUTINE X 2) w Reflex to ID Panel     Status: None (Preliminary result)   Collection Time: 10/21/20 11:36 AM   Specimen: BLOOD  Result Value Ref Range Status   Specimen Description BLOOD BLOOD LEFT HAND  Final   Special Requests   Final    BOTTLES DRAWN AEROBIC AND ANAEROBIC Blood Culture adequate volume   Culture   Final    NO GROWTH 3 DAYS Performed at [facility], [address], [place], [place] [address]    Report Status PENDING  Incomplete  CULTURE, BLOOD (ROUTINE X 2) w Reflex to ID Panel     Status: None (Preliminary result)   Collection Time: 10/21/20  4:10 PM   Specimen: BLOOD  Result Value Ref Range Status   Specimen Description BLOOD RIGHT ANTECUBITAL  Final   Special Requests   Final    BOTTLES DRAWN AEROBIC AND ANAEROBIC Blood Culture adequate volume   Culture   Final    NO GROWTH 3 DAYS Performed at [facility], [address], [place], [place] [address]    Report Status PENDING  Incomplete    Coagulation Studies: Recent Labs    10/24/20 0932  LABPROT 15.8*  INR 1.3*    Urinalysis: No results for input(s): COLORURINE, LABSPEC,  PHURINE, GLUCOSEU, HGBUR, BILIRUBINUR, KETONESUR, PROTEINUR, UROBILINOGEN, NITRITE, LEUKOCYTESUR in the last 72 hours.  Invalid input(s): APPERANCEUR    Imaging: PERIPHERAL VASCULAR CATHETERIZATION  Result Date: 10/24/2020 See op note    Medications:   . sodium chloride    . sodium chloride    . [START ON 10/29/2020] ferumoxytol    . furosemide (LASIX) infusion 8 mg/hr (10/24/20 0158)   . amLODipine  10 mg Oral Daily  . aspirin  81 mg Oral Daily  . calcitRIOL  0.25 mcg Oral Once per day on Mon Wed Sat  . [START ON 10/25/2020] calcium carbonate  1,250 mg Oral BID WC  . carvedilol  25 mg Oral BID WC  . cephALEXin  250 mg Oral Q12H  . Chlorhexidine Gluconate Cloth  6 each Topical Q0600  . cloNIDine  0.2 mg Oral TID  . epoetin (EPOGEN/PROCRIT) injection  4,000 Units Subcutaneous Q7 days  . fentaNYL      . hydrALAZINE  50 mg Oral TID  . insulin aspart  0-5 Units Subcutaneous QHS  . insulin aspart  0-9 Units Subcutaneous TID WC  . [START ON 10/25/2020] insulin glargine  13 Units Subcutaneous Daily  . isosorbide mononitrate  30 mg Oral Daily  . levETIRAcetam  500 mg Oral BID  . midazolam      . pneumococcal 23 valent vaccine  0.5 mL Intramuscular Tomorrow-1000  . pravastatin  20 mg Oral q1800  . sodium bicarbonate  650 mg Oral TID  . tuberculin  5 Units Intradermal Once  . Vitamin D (Ergocalciferol)  50,000 Units Oral Q7 days   sodium chloride, sodium chloride, acetaminophen, albuterol, alteplase, calcium carbonate, dextromethorphan-guaiFENesin, heparin, hydrALAZINE, HYDROmorphone (DILAUDID) injection, lidocaine (PF), lidocaine-prilocaine, ondansetron (ZOFRAN) IV, ondansetron (ZOFRAN) IV, pentafluoroprop-tetrafluoroeth  Assessment/ Plan  Renee [name] is a 44 y.o.  female with chronic kidney disease, hypertension, obesity, heart failure with preserved ejection fraction, presented to the emergency room on 10/20/2020 with worsening shortness of breath and bilateral lower  extremity edema  # CKD stage V Associated with uncontrolled hypertension Patient has progressively worsening chronic kidney disease Presented with SOB, orthopnea, dyspnea on exertion -Patient willing to undergo dialysis treatment now.  PermCath to be placed followed by initiation of dialysis treatment.  # Hypertensive Crisis # Heart failure with preserved ejection fraction Blood pressure readings on presentation to the ED was 240s over 120s Patient was not taking her blood pressure medicines for months as she was not able to afford it -Blood pressure still labile.  Continue amlodipine, carvedilol, clonidine  #Secondary hyperparathyroidism Continue to monitor bone mineral metabolism parameters.  Maintain the patient on calcitriol at this time.  #Anemia of chronic kidney disease Currently on Epogen 4000 units subcutaneous weekly.  We will likely need to increase this.   LOS: 3 Corianne Buccellato 10/25/20214:50 PM

## 2020-10-24 NOTE — Progress Notes (Addendum)
U/a was collected to check for pregnancy but per lab the way the order was place they can not run it. Per lab the order will need to be change to hcg qualitative to run it. Notify NP Randol Kern. Will notify incoming shift. Will continue to monitor.  Update 0725: NP Randol Kern ordered hcg qualitative pregnancy urine test. Notify incoming shift. Will continue to monitor.

## 2020-10-24 NOTE — Progress Notes (Signed)
PROGRESS NOTE    Renee [name]  ZYS:[id number] DOB: 27-Mar-1976 DOA: 10/21/2020 PCP: Minda Ditto, MD   Brief Narrative: Taken from H&P. Renee [name] is a 44 y.o. female with medical history significant of hypertension, hyperlipidemia, diabetes mellitus, CKD stage IV, dCHF, morbid obesity, who presents with worsening leg edema, shortness breath, chest pain, increased urinary frequency. Patient states that he has not been taking her medications for more than one month. On arrival she was found to have AKI, most likely now end-stage renal disease along with severely elevated blood pressure and chest x-ray with cardiomegaly and interstitial edema.  Nephrology was consulted.  Patient is still quite reluctant to start dialysis.  Subjective: Patient appears lethargic when seen today.  No new complaint.  Waiting for HD catheter placement followed by starting of dialysis.  Husband at bedside.  Assessment & Plan:   Principal Problem:   ARF (acute renal failure) (HCC) Active Problems:   Hypokalemia   Hypertensive emergency   Chronic diastolic heart failure (HCC)   HTN (hypertension)   Acute pulmonary edema (HCC)   Type II diabetes mellitus with renal manifestations (HCC)   Chest pain   Elevated troponin   UTI (urinary tract infection)   Hypocalcemia   HLD (hyperlipidemia)  AKI with CKD stage IV.  Most likely ESRD now.  Worsening creatinine function with BUN at 156 and creatinine at 14.22 today.  350 cc of urinary output recorded.  Nephrology is on board and recommending dialysis. Patient agrees to start dialysis yesterday.  Going for HD catheter placement followed by starting of dialysis today.   Her blurry vision and foggy mind is most likely due to uremia. -Continue with Lasix infusion for now although not much urinary output.  UTI.  UA with concern of UTI.  Blood cultures negative.  Urine culture with E. coli-good sensitivity except ampicillin and Augmentin.  Initially received  ceftriaxone which was transitioned to Keflex. -Continue Keflex to complete a 5-day course.  Hypertension with hypertensive emergency admission.  Patient was not taking any meds for the past month.  Blood pressure improves with intervention. -Continue with amlodipine, Coreg, clonidine and hydralazine. -Continue with Imdur.  Chronic diastolic heart failure and flash pulmonary edema: 2D echo on 08/27/2019 showed EF of 55-60%.  Patient has 2+ leg edema, and mild pulmonary edema on chest x-ray, but saturating well on room air.  Continues to have decreased breath sound at bases.  No significant diuresis. -Patient will need dialysis. -Continue with IV Lasix infusion for now.  Chest pain and elevated troponin.  Chest pain resolved today.   Mildly elevated troponin with a flat curve, most likely secondary to demand with markedly elevated blood pressure. -Continue with aspirin and statin.  Anemia.  Hemoglobin dropped to 6.3 , No obvious bleeding.  Anemia panel with some iron deficiency.  Hemoglobin improved to 8 today after getting 1 unit of PRBC. She also received 1 dose of IV iron and Epogen on 10/23/2020. -Continue to monitor  Type 2 diabetes mellitus with renal manifestations [facility]):  Repeat A1c with significant improvement to 5.9.  Patient was not taking her meds. Patient had one episode of hypoglycemia this morning.  Improved with intervention. -Decrease Levemir to daily instead of twice daily. -Continue with renal SSI -Monitor for hypoglycemia due to decreased urinary clearance.  Hypocalcemia.  Corrected calcium of 7.5 despite receiving 3 g total of calcium gluconate. Most likely secondary to renal disease. Parathyroid and calcitriol levels are pending, vitamin D 25-hydroxy low at 11 -Give  her another 2 g of calcium gluconate. -Start her on supplemental calcium from tomorrow.  Metabolic acidosis.  Secondary to worsening renal function. -P.o. sodium bicarbonate replacement.  Hypokalemia.   Fluctuating between 3.4 and 3.5 -Monitor electrolytes, she is high risk for becoming hyperkalemic.  Hyperlipidemia. -Check lipid panel with morning labs. -Continue pravastatin.  Morbid obesity. Body mass index is 44.08 kg/m.  This will complicate overall prognosis.  Objective: Vitals:   10/24/20 1425 10/24/20 1429 10/24/20 1434 10/24/20 1445  BP: (!) 150/85 (!) 148/75 (!) 147/76 (!) 142/82  Pulse:    63  Resp: (!) 28 20 20 20   Temp:      TempSrc:      SpO2: 100% 99% 100% 92%  Weight:      Height:        Intake/Output Summary (Last 24 hours) at 10/24/2020 1450 Last data filed at 10/24/2020 0641 Gross per 24 hour  Intake 1107.83 ml  Output 820 ml  Net 287.83 ml   Filed Weights   10/22/20 0508 10/23/20 0341 10/24/20 0325  Weight: 115.8 kg 115.3 kg 120.2 kg    Examination:  General.  Morbidly obese, lethargic appearing lady, in no acute distress. Pulmonary.  Lungs clear bilaterally, normal respiratory effort. CV.  Regular rate and rhythm, no JVD, rub or murmur. Abdomen.  Soft, nontender, nondistended, BS positive. CNS.  Alert and oriented x3.  No focal neurologic deficit. Extremities.  Trace LE edema, no cyanosis, pulses intact and symmetrical. Psychiatry.  Judgment and insight appears normal.  DVT prophylaxis: SCDs- Code Status: Full Family Communication: Husband was updated at bed site. Disposition Plan:  Status is: Inpatient  Remains inpatient appropriate because:Inpatient level of care appropriate due to severity of illness   Dispo: The patient is from: Home              Anticipated d/c is to: Home              Anticipated d/c date is: > 3 days              Patient currently is medically stable to d/c.  Is going to start dialysis today- will need clip procedure completed before discharge.  Consultants:   Nephrology  Procedures:  Antimicrobials:  Keflex  Data Reviewed: I have personally reviewed following labs and imaging studies  CBC: Recent  Labs  Lab 10/20/20 2119 10/22/20 0036 10/22/20 0816 10/23/20 0455 10/24/20 0557  WBC 11.3* 7.3 6.0 6.3 7.8  HGB 8.0* 6.9* 6.8* 6.6* 8.0*  HCT 26.1* 22.7* 21.8* 22.1* 25.8*  MCV 75.2* 75.7* 74.1* 75.9* 75.2*  PLT 348 272 248 259 170   Basic Metabolic Panel: Recent Labs  Lab 10/20/20 2119 10/21/20 0458 10/22/20 0036 10/23/20 0455 10/24/20 0556  NA 135 136 135 136 138  K 3.4* 3.3* 3.5 3.4* 3.5  CL 102 103 103 104 106  CO2 12* 13* 14* 15* 14*  GLUCOSE 171* 138* 134* 93 82  BUN 146* 153* 156* 155* 160*  CREATININE 13.49* 13.63* 14.22* 14.19* 14.45*  CALCIUM 6.9* 6.9* 6.6* 6.3* 6.6*  MG  --  2.6* 2.4  --   --   PHOS  --  9.7*  --  10.9* 11.3*   GFR: Estimated Creatinine Clearance: 6.4 mL/min (A) (by C-G formula based on SCr of 14.45 mg/dL (H)). Liver Function Tests: Recent Labs  Lab 10/22/20 0816 10/23/20 0455 10/24/20 0556  AST 14*  --   --   ALT 32  --   --  ALKPHOS 116  --   --   BILITOT 0.5  --   --   PROT 6.2*  --   --   ALBUMIN 2.9* 2.9* 2.9*   No results for input(s): LIPASE, AMYLASE in the last 168 hours. No results for input(s): AMMONIA in the last 168 hours. Coagulation Profile: Recent Labs  Lab 10/21/20 1145 10/24/20 0932  INR 1.3* 1.3*   Cardiac Enzymes: No results for input(s): CKTOTAL, CKMB, CKMBINDEX, TROPONINI in the last 168 hours. BNP (last 3 results) No results for input(s): PROBNP in the last 8760 hours. HbA1C: No results for input(s): HGBA1C in the last 72 hours. CBG: Recent Labs  Lab 10/24/20 1008 10/24/20 1205 10/24/20 1250 10/24/20 1342 10/24/20 1358  GLUCAP 91 69* 106* 63* 178*   Lipid Profile: Recent Labs    10/23/20 0455  CHOL 88  HDL 28*  LDLCALC 48  TRIG 60  CHOLHDL 3.1   Thyroid Function Tests: No results for input(s): TSH, T4TOTAL, FREET4, T3FREE, THYROIDAB in the last 72 hours. Anemia Panel: Recent Labs    10/22/20 1314  VITAMINB12 986*  FOLATE 7.7  FERRITIN 20  TIBC 308  IRON 18*  RETICCTPCT 2.3     Sepsis Labs: No results for input(s): PROCALCITON, LATICACIDVEN in the last 168 hours.  Recent Results (from the past 240 hour(s))  Respiratory Panel by RT PCR (Flu A&B, Covid) - Nasopharyngeal Swab     Status: None   Collection Time: 10/21/20  4:58 AM   Specimen: Nasopharyngeal Swab  Result Value Ref Range Status   SARS Coronavirus 2 by RT PCR NEGATIVE NEGATIVE Final    Comment: (NOTE) SARS-CoV-2 target nucleic acids are NOT DETECTED.  The SARS-CoV-2 RNA is generally detectable in upper respiratoy specimens during the acute phase of infection. The lowest concentration of SARS-CoV-2 viral copies this assay can detect is 131 copies/mL. A negative result does not preclude SARS-Cov-2 infection and should not be used as the sole basis for treatment or other patient management decisions. A negative result may occur with  improper specimen collection/handling, submission of specimen other than nasopharyngeal swab, presence of viral mutation(s) within the areas targeted by this assay, and inadequate number of viral copies (<131 copies/mL). A negative result must be combined with clinical observations, patient history, and epidemiological information. The expected result is Negative.  Fact Sheet for Patients:  PinkCheek.be  Fact Sheet for Healthcare Providers:  GravelBags.it  This test is no t yet approved or cleared by the [place] FDA and  has been authorized for detection and/or diagnosis of SARS-CoV-2 by FDA under an Emergency Use Authorization (EUA). This EUA will remain  in effect (meaning this test can be used) for the duration of the COVID-19 declaration under Section 564(b)(1) of the Act, 21 U.S.C. section 360bbb-3(b)(1), unless the authorization is terminated or revoked sooner.     Influenza A by PCR NEGATIVE NEGATIVE Final   Influenza B by PCR NEGATIVE NEGATIVE Final    Comment: (NOTE) The Xpert Xpress  SARS-CoV-2/FLU/RSV assay is intended as an aid in  the diagnosis of influenza from Nasopharyngeal swab specimens and  should not be used as a sole basis for treatment. Nasal washings and  aspirates are unacceptable for Xpert Xpress SARS-CoV-2/FLU/RSV  testing.  Fact Sheet for Patients: PinkCheek.be  Fact Sheet for Healthcare Providers: GravelBags.it  This test is not yet approved or cleared by the [place] FDA and  has been authorized for detection and/or diagnosis of SARS-CoV-2 by  FDA under an  Emergency Use Authorization (EUA). This EUA will remain  in effect (meaning this test can be used) for the duration of the  Covid-19 declaration under Section 564(b)(1) of the Act, 21  U.S.C. section 360bbb-3(b)(1), unless the authorization is  terminated or revoked. Performed at [facility], [address] [place], [place] [address]   Urine Culture     Status: Abnormal   Collection Time: 10/21/20  4:58 AM   Specimen: Urine, Random  Result Value Ref Range Status   Specimen Description   Final    URINE, RANDOM Performed at [facility], [address] [place], [place] [address]    Special Requests   Final    Normal Performed at [facility], [address] [place], [place] [address]    Culture >=100,000 COLONIES/mL ESCHERICHIA COLI (A)  Final   Report Status 10/23/2020 FINAL  Final   Organism ID, Bacteria ESCHERICHIA COLI (A)  Final      Susceptibility   Escherichia coli - MIC*    AMPICILLIN >=32 RESISTANT Resistant     CEFAZOLIN <=4 SENSITIVE Sensitive     CEFTRIAXONE <=0.25 SENSITIVE Sensitive     CIPROFLOXACIN <=0.25 SENSITIVE Sensitive     GENTAMICIN <=1 SENSITIVE Sensitive     IMIPENEM <=0.25 SENSITIVE Sensitive     NITROFURANTOIN <=16 SENSITIVE Sensitive     TRIMETH/SULFA <=20 SENSITIVE Sensitive     AMPICILLIN/SULBACTAM 16 INTERMEDIATE Intermediate     PIP/TAZO <=4 SENSITIVE  Sensitive     * >=100,000 COLONIES/mL ESCHERICHIA COLI  CULTURE, BLOOD (ROUTINE X 2) w Reflex to ID Panel     Status: None (Preliminary result)   Collection Time: 10/21/20 11:36 AM   Specimen: BLOOD  Result Value Ref Range Status   Specimen Description BLOOD BLOOD LEFT HAND  Final   Special Requests   Final    BOTTLES DRAWN AEROBIC AND ANAEROBIC Blood Culture adequate volume   Culture   Final    NO GROWTH 3 DAYS Performed at [facility], [address] [place], [place] [address]    Report Status PENDING  Incomplete  CULTURE, BLOOD (ROUTINE X 2) w Reflex to ID Panel     Status: None (Preliminary result)   Collection Time: 10/21/20  4:10 PM   Specimen: BLOOD  Result Value Ref Range Status   Specimen Description BLOOD RIGHT ANTECUBITAL  Final   Special Requests   Final    BOTTLES DRAWN AEROBIC AND ANAEROBIC Blood Culture adequate volume   Culture   Final    NO GROWTH 3 DAYS Performed at [facility], [address] [place], [place] [address]    Report Status PENDING  Incomplete     Radiology Studies: PERIPHERAL VASCULAR CATHETERIZATION  Result Date: 10/24/2020 See op note   Scheduled Meds: . [MAR Hold] amLODipine  10 mg Oral Daily  . [MAR Hold] aspirin  81 mg Oral Daily  . [MAR Hold] calcitRIOL  0.25 mcg Oral Once per day on Mon Wed Sat  . [MAR Hold] calcium carbonate  1,250 mg Oral BID WC  . [MAR Hold] carvedilol  25 mg Oral BID WC  . [MAR Hold] cephALEXin  250 mg Oral Q12H  . [MAR Hold] Chlorhexidine Gluconate Cloth  6 each Topical Q0600  . [MAR Hold] cloNIDine  0.2 mg Oral TID  . [MAR Hold] epoetin (EPOGEN/PROCRIT) injection  4,000 Units Subcutaneous Q7 days  . fentaNYL      . [MAR Hold] hydrALAZINE  50 mg Oral TID  . [  MAR Hold] insulin aspart  0-5 Units Subcutaneous QHS  . [MAR Hold] insulin aspart  0-9 Units Subcutaneous TID WC  . [MAR Hold] insulin glargine  13 Units Subcutaneous Daily  . [MAR Hold] isosorbide mononitrate  30 mg Oral Daily    . [MAR Hold] levETIRAcetam  500 mg Oral BID  . midazolam      . pneumococcal 23 valent vaccine  0.5 mL Intramuscular Tomorrow-1000  . [MAR Hold] pravastatin  20 mg Oral q1800  . [MAR Hold] sodium bicarbonate  650 mg Oral TID  . tuberculin  5 Units Intradermal Once  . [MAR Hold] Vitamin D (Ergocalciferol)  50,000 Units Oral Q7 days   Continuous Infusions: . [MAR Hold] sodium chloride    . [MAR Hold] sodium chloride    . sodium chloride 10 mL/hr at 10/24/20 1400  . [MAR Hold] ferumoxytol    . furosemide (LASIX) infusion 8 mg/hr (10/24/20 0158)     LOS: 3 days   Time spent: 25 minutes.  Lorella Nimrod, MD [facility]  If 7PM-7AM, please contact night-coverage [email or web address]  10/24/2020, 2:50 PM   This record has been created using Systems analyst. Errors have been sought and corrected,but may not always be located. Such creation errors do not reflect on the standard of care.

## 2020-10-25 LAB — CBC
HCT: 25.9 % — ABNORMAL LOW (ref 36.0–46.0)
Hemoglobin: 8 g/dL — ABNORMAL LOW (ref 12.0–15.0)
MCH: 23.6 pg — ABNORMAL LOW (ref 26.0–34.0)
MCHC: 30.9 g/dL (ref 30.0–36.0)
MCV: 76.4 fL — ABNORMAL LOW (ref 80.0–100.0)
Platelets: 268 10*3/uL (ref 150–400)
RBC: 3.39 MIL/uL — ABNORMAL LOW (ref 3.87–5.11)
RDW: 17.2 % — ABNORMAL HIGH (ref 11.5–15.5)
WBC: 8.2 10*3/uL (ref 4.0–10.5)
nRBC: 0.2 % (ref 0.0–0.2)

## 2020-10-25 LAB — RENAL FUNCTION PANEL
Albumin: 3 g/dL — ABNORMAL LOW (ref 3.5–5.0)
Anion gap: 17 — ABNORMAL HIGH (ref 5–15)
BUN: 121 mg/dL — ABNORMAL HIGH (ref 6–20)
CO2: 17 mmol/L — ABNORMAL LOW (ref 22–32)
Calcium: 6.7 mg/dL — ABNORMAL LOW (ref 8.9–10.3)
Chloride: 100 mmol/L (ref 98–111)
Creatinine, Ser: 11.93 mg/dL — ABNORMAL HIGH (ref 0.44–1.00)
GFR, Estimated: 4 mL/min — ABNORMAL LOW (ref >60)
Glucose, Bld: 115 mg/dL — ABNORMAL HIGH (ref 70–99)
Phosphorus: 8.5 mg/dL — ABNORMAL HIGH (ref 2.5–4.6)
Potassium: 3.3 mmol/L — ABNORMAL LOW (ref 3.5–5.1)
Sodium: 134 mmol/L — ABNORMAL LOW (ref 135–145)

## 2020-10-25 LAB — PTH, INTACT AND CALCIUM
Calcium, Total (PTH): 6.4 mg/dL — CL (ref 8.7–10.2)
PTH: 335 pg/mL — ABNORMAL HIGH (ref 15–65)

## 2020-10-25 LAB — GLUCOSE, CAPILLARY
Glucose-Capillary: 101 mg/dL — ABNORMAL HIGH (ref 70–99)
Glucose-Capillary: 126 mg/dL — ABNORMAL HIGH (ref 70–99)
Glucose-Capillary: 111 mg/dL — ABNORMAL HIGH (ref 70–99)
Glucose-Capillary: 129 mg/dL — ABNORMAL HIGH (ref 70–99)

## 2020-10-25 MED ORDER — HEPARIN SODIUM (PORCINE) 5000 UNIT/ML IJ SOLN
5000.0000 [IU] | Freq: Three times a day (TID) | INTRAMUSCULAR | Status: DC
  Administered 2020-10-25 – 2020-10-27 (×4): 5000 [IU] via SUBCUTANEOUS
  Filled 2020-10-25 (×4): qty 1

## 2020-10-25 NOTE — Progress Notes (Signed)
PROGRESS NOTE    Renee [name]  NTI:[id number] DOB: Dec 15, 1976 DOA: 10/21/2020 PCP: Minda Ditto, MD   Brief Narrative: Taken from H&P. Renee [name] is a 44 y.o. female with medical history significant of hypertension, hyperlipidemia, diabetes mellitus, CKD stage IV, dCHF, morbid obesity, who presents with worsening leg edema, shortness breath, chest pain, increased urinary frequency. Patient states that he has not been taking her medications for more than one month. On arrival she was found to have AKI, most likely now end-stage renal disease along with severely elevated blood pressure and chest x-ray with cardiomegaly and interstitial edema.  Nephrology was consulted.  Patient is still quite reluctant to start dialysis.  Subjective: Patient appears tired but no new complaint today. Tolerated first session of dialysis well yesterday. Asking about her blurry vision and when it will improved. Told her that she has to see an ophthalmologist if no change in her vision after couple of weeks starting dialysis.  Assessment & Plan:   Principal Problem:   ARF (acute renal failure) (HCC) Active Problems:   Hypokalemia   Hypertensive emergency   Chronic diastolic heart failure (HCC)   HTN (hypertension)   Acute pulmonary edema (HCC)   Type II diabetes mellitus with renal manifestations (HCC)   Chest pain   Elevated troponin   UTI (urinary tract infection)   Hypocalcemia   HLD (hyperlipidemia)  AKI with CKD stage IV.  ESRD now. HD started yesterday after placing tunneled catheter, tolerated first session well.  Her blurry vision and foggy mind is most likely due to uremia. Discussed with patient that if she continued to have blurry vision after next 2 weeks, she should see a ophthalmologist as an outpatient. -DC IV lasix infusion. - CLIPP procedure started by nephrology. -Continue with HD per nephrology.  UTI.  UA with concern of UTI.  Blood cultures negative.  Urine culture with  E. coli-good sensitivity except ampicillin and Augmentin.  Initially received ceftriaxone which was transitioned to Keflex. -Continue Keflex to complete a 5-day course.  Hypertension with hypertensive emergency admission.  Patient was not taking any meds for the past month. Blood pressure elevated today. -Continue with amlodipine, Coreg, clonidine and hydralazine. -Continue with Imdur. -Going for another session of dialysis which should help. -Can uptitrate meds if needed.  Chronic diastolic heart failure and flash pulmonary edema: 2D echo on 08/27/2019 showed EF of 55-60%.  Patient has 2+ leg edema, and mild pulmonary edema on chest x-ray, but saturating well on room air.  Continues to have decreased breath sound at bases.  No significant diuresis. -IV Lasix infusion was discontinued today and her volume will be managed with dialysis now.  Chest pain and elevated troponin.  Chest pain resolved.   Mildly elevated troponin with a flat curve, most likely secondary to demand with markedly elevated blood pressure. -Continue with aspirin and statin.  Anemia.  Hemoglobin dropped to 6.3 , No obvious bleeding.  Anemia panel with some iron deficiency.  Hemoglobin improved to 8 today after getting 1 unit of PRBC. She also received 1 dose of IV iron and Epogen on 10/23/2020. -Continue to monitor  Type 2 diabetes mellitus with renal manifestations [facility]):  Repeat A1c with significant improvement to 5.9.  Patient was not taking her meds. CBG within goal today. Levemir was decreased yesterday with 1 episode of hypoglycemia. -Continue daily dose of Levemir for now. -Continue with renal SSI -Monitor for hypoglycemia due to decreased urinary clearance.  Hypocalcemia.  Corrected calcium of 7.5 despite receiving  3 g total of calcium gluconate. Most likely secondary to renal disease. Parathyroid and calcitriol levels are pending, vitamin D 25-hydroxy low at 11 -Give her another 2 g of calcium  gluconate. -Continue supplemental calcium.  Metabolic acidosis.  Secondary to worsening renal function. -P.o. sodium bicarbonate replacement.  Hypokalemia.  Fluctuating between 3.4 and 3.5 -Monitor electrolytes, potassium will be managed with adjusting dialysis bath.  Hyperlipidemia. -Check lipid panel with morning labs. -Continue pravastatin.  Morbid obesity. Body mass index is 45.16 kg/m.  This will complicate overall prognosis.  Objective: Vitals:   10/25/20 0754 10/25/20 1119 10/25/20 1345 10/25/20 1400  BP: (!) 144/80 (!) 151/84 (!) 155/84 (!) 154/80  Pulse: (!) 58 60 62 63  Resp: 18 16    Temp: 97.8 F (36.6 C) 98 F (36.7 C) 98.7 F (37.1 C) 98.7 F (37.1 C)  TempSrc: Oral Oral Oral Oral  SpO2: 99% 95% 98% 99%  Weight:      Height:        Intake/Output Summary (Last 24 hours) at 10/25/2020 1533 Last data filed at 10/25/2020 1142 Gross per 24 hour  Intake 300.41 ml  Output 700 ml  Net -399.59 ml   Filed Weights   10/23/20 0341 10/24/20 0325 10/25/20 0500  Weight: 115.3 kg 120.2 kg 123.1 kg    Examination:  General. Well-developed, obese lady, in no acute distress. Pulmonary.  Lungs clear bilaterally, normal respiratory effort. CV.  Regular rate and rhythm, no JVD, rub or murmur. Abdomen.  Soft, nontender, nondistended, BS positive. CNS.  Alert and oriented x3.  No focal neurologic deficit. Extremities.  1+ LE edema, no cyanosis, pulses intact and symmetrical. Psychiatry.  Judgment and insight appears normal.  DVT prophylaxis: Heparin Code Status: Full Family Communication: Husband was updated at bed site. Disposition Plan:  Status is: Inpatient  Remains inpatient appropriate because:Inpatient level of care appropriate due to severity of illness   Dispo: The patient is from: Home              Anticipated d/c is to: Home              Anticipated d/c date is: 2-3 days.              Patient currently is medically stable to d/c. Need completion of  clip procedure as she was started on dialysis yesterday.  Consultants:   Nephrology  Procedures:  Antimicrobials:  Keflex  Data Reviewed: I have personally reviewed following labs and imaging studies  CBC: Recent Labs  Lab 10/22/20 0036 10/22/20 0816 10/23/20 0455 10/24/20 0557 10/25/20 0522  WBC 7.3 6.0 6.3 7.8 8.2  HGB 6.9* 6.8* 6.6* 8.0* 8.0*  HCT 22.7* 21.8* 22.1* 25.8* 25.9*  MCV 75.7* 74.1* 75.9* 75.2* 76.4*  PLT 272 248 259 282 948   Basic Metabolic Panel: Recent Labs  Lab 10/21/20 0458 10/21/20 0458 10/22/20 0036 10/22/20 1314 10/23/20 0455 10/24/20 0556 10/25/20 0522  NA 136  --  135  --  136 138 134*  K 3.3*  --  3.5  --  3.4* 3.5 3.3*  CL 103  --  103  --  104 106 100  CO2 13*  --  14*  --  15* 14* 17*  GLUCOSE 138*  --  134*  --  93 82 115*  BUN 153*  --  156*  --  155* 160* 121*  CREATININE 13.63*  --  14.22*  --  14.19* 14.45* 11.93*  CALCIUM 6.9*   < > 6.6*  6.4* 6.3* 6.6* 6.7*  MG 2.6*  --  2.4  --   --   --   --   PHOS 9.7*  --   --   --  10.9* 11.3* 8.5*   < > = values in this interval not displayed.   GFR: Estimated Creatinine Clearance: 7.8 mL/min (A) (by C-G formula based on SCr of 11.93 mg/dL (H)). Liver Function Tests: Recent Labs  Lab 10/22/20 0816 10/23/20 0455 10/24/20 0556 10/25/20 0522  AST 14*  --   --   --   ALT 32  --   --   --   ALKPHOS 116  --   --   --   BILITOT 0.5  --   --   --   PROT 6.2*  --   --   --   ALBUMIN 2.9* 2.9* 2.9* 3.0*   No results for input(s): LIPASE, AMYLASE in the last 168 hours. No results for input(s): AMMONIA in the last 168 hours. Coagulation Profile: Recent Labs  Lab 10/21/20 1145 10/24/20 0932  INR 1.3* 1.3*   Cardiac Enzymes: No results for input(s): CKTOTAL, CKMB, CKMBINDEX, TROPONINI in the last 168 hours. BNP (last 3 results) No results for input(s): PROBNP in the last 8760 hours. HbA1C: No results for input(s): HGBA1C in the last 72 hours. CBG: Recent Labs  Lab  10/24/20 1455 10/24/20 1805 10/24/20 2037 10/25/20 0751 10/25/20 1120  GLUCAP 96 81 94 101* 126*   Lipid Profile: Recent Labs    10/23/20 0455  CHOL 88  HDL 28*  LDLCALC 48  TRIG 60  CHOLHDL 3.1   Thyroid Function Tests: No results for input(s): TSH, T4TOTAL, FREET4, T3FREE, THYROIDAB in the last 72 hours. Anemia Panel: No results for input(s): VITAMINB12, FOLATE, FERRITIN, TIBC, IRON, RETICCTPCT in the last 72 hours. Sepsis Labs: No results for input(s): PROCALCITON, LATICACIDVEN in the last 168 hours.  Recent Results (from the past 240 hour(s))  Respiratory Panel by RT PCR (Flu A&B, Covid) - Nasopharyngeal Swab     Status: None   Collection Time: 10/21/20  4:58 AM   Specimen: Nasopharyngeal Swab  Result Value Ref Range Status   SARS Coronavirus 2 by RT PCR NEGATIVE NEGATIVE Final    Comment: (NOTE) SARS-CoV-2 target nucleic acids are NOT DETECTED.  The SARS-CoV-2 RNA is generally detectable in upper respiratoy specimens during the acute phase of infection. The lowest concentration of SARS-CoV-2 viral copies this assay can detect is 131 copies/mL. A negative result does not preclude SARS-Cov-2 infection and should not be used as the sole basis for treatment or other patient management decisions. A negative result may occur with  improper specimen collection/handling, submission of specimen other than nasopharyngeal swab, presence of viral mutation(s) within the areas targeted by this assay, and inadequate number of viral copies (<131 copies/mL). A negative result must be combined with clinical observations, patient history, and epidemiological information. The expected result is Negative.  Fact Sheet for Patients:  PinkCheek.be  Fact Sheet for Healthcare Providers:  GravelBags.it  This test is no t yet approved or cleared by the [place] FDA and  has been authorized for detection and/or diagnosis of  SARS-CoV-2 by FDA under an Emergency Use Authorization (EUA). This EUA will remain  in effect (meaning this test can be used) for the duration of the COVID-19 declaration under Section 564(b)(1) of the Act, 21 U.S.C. section 360bbb-3(b)(1), unless the authorization is terminated or revoked sooner.     Influenza A by PCR  NEGATIVE NEGATIVE Final   Influenza B by PCR NEGATIVE NEGATIVE Final    Comment: (NOTE) The Xpert Xpress SARS-CoV-2/FLU/RSV assay is intended as an aid in  the diagnosis of influenza from Nasopharyngeal swab specimens and  should not be used as a sole basis for treatment. Nasal washings and  aspirates are unacceptable for Xpert Xpress SARS-CoV-2/FLU/RSV  testing.  Fact Sheet for Patients: PinkCheek.be  Fact Sheet for Healthcare Providers: GravelBags.it  This test is not yet approved or cleared by the [place] FDA and  has been authorized for detection and/or diagnosis of SARS-CoV-2 by  FDA under an Emergency Use Authorization (EUA). This EUA will remain  in effect (meaning this test can be used) for the duration of the  Covid-19 declaration under Section 564(b)(1) of the Act, 21  U.S.C. section 360bbb-3(b)(1), unless the authorization is  terminated or revoked. Performed at [facility], [address] [place], [place] [address]   Urine Culture     Status: Abnormal   Collection Time: 10/21/20  4:58 AM   Specimen: Urine, Random  Result Value Ref Range Status   Specimen Description   Final    URINE, RANDOM Performed at [facility], [address] [place], [place] [address]    Special Requests   Final    Normal Performed at [facility], [address] [place], [place] [address]    Culture >=100,000 COLONIES/mL ESCHERICHIA COLI (A)  Final   Report Status 10/23/2020 FINAL  Final   Organism ID, Bacteria ESCHERICHIA COLI (A)  Final      Susceptibility    Escherichia coli - MIC*    AMPICILLIN >=32 RESISTANT Resistant     CEFAZOLIN <=4 SENSITIVE Sensitive     CEFTRIAXONE <=0.25 SENSITIVE Sensitive     CIPROFLOXACIN <=0.25 SENSITIVE Sensitive     GENTAMICIN <=1 SENSITIVE Sensitive     IMIPENEM <=0.25 SENSITIVE Sensitive     NITROFURANTOIN <=16 SENSITIVE Sensitive     TRIMETH/SULFA <=20 SENSITIVE Sensitive     AMPICILLIN/SULBACTAM 16 INTERMEDIATE Intermediate     PIP/TAZO <=4 SENSITIVE Sensitive     * >=100,000 COLONIES/mL ESCHERICHIA COLI  CULTURE, BLOOD (ROUTINE X 2) w Reflex to ID Panel     Status: None (Preliminary result)   Collection Time: 10/21/20 11:36 AM   Specimen: BLOOD  Result Value Ref Range Status   Specimen Description BLOOD BLOOD LEFT HAND  Final   Special Requests   Final    BOTTLES DRAWN AEROBIC AND ANAEROBIC Blood Culture adequate volume   Culture   Final    NO GROWTH 4 DAYS Performed at [facility], [address], [place], [place] [address]    Report Status PENDING  Incomplete  CULTURE, BLOOD (ROUTINE X 2) w Reflex to ID Panel     Status: None (Preliminary result)   Collection Time: 10/21/20  4:10 PM   Specimen: BLOOD  Result Value Ref Range Status   Specimen Description BLOOD RIGHT ANTECUBITAL  Final   Special Requests   Final    BOTTLES DRAWN AEROBIC AND ANAEROBIC Blood Culture adequate volume   Culture   Final    NO GROWTH 4 DAYS Performed at [facility], [address], [place], [place] [address]    Report Status PENDING  Incomplete     Radiology Studies: PERIPHERAL VASCULAR CATHETERIZATION  Result Date: 10/24/2020 See op note   Scheduled Meds: . amLODipine  10 mg Oral Daily  . aspirin  81 mg Oral Daily  .  calcitRIOL  0.25 mcg Oral Once per day on Mon Wed Sat  . calcium carbonate  1,250 mg Oral BID WC  . carvedilol  25 mg Oral BID WC  . cephALEXin  250 mg Oral Q12H  . Chlorhexidine Gluconate Cloth  6 each Topical Q0600  . cloNIDine  0.2 mg Oral TID  . epoetin  (EPOGEN/PROCRIT) injection  4,000 Units Subcutaneous Q7 days  . heparin  5,000 Units Subcutaneous Q8H  . hydrALAZINE  50 mg Oral TID  . insulin aspart  0-5 Units Subcutaneous QHS  . insulin aspart  0-9 Units Subcutaneous TID WC  . insulin glargine  13 Units Subcutaneous Daily  . isosorbide mononitrate  30 mg Oral Daily  . levETIRAcetam  500 mg Oral BID  . pneumococcal 23 valent vaccine  0.5 mL Intramuscular Tomorrow-1000  . pravastatin  20 mg Oral q1800  . sodium bicarbonate  650 mg Oral TID  . tuberculin  5 Units Intradermal Once  . Vitamin D (Ergocalciferol)  50,000 Units Oral Q7 days   Continuous Infusions: . sodium chloride    . sodium chloride    . [START ON 10/29/2020] ferumoxytol       LOS: 4 days   Time spent: 25 minutes.  Lorella Nimrod, MD [facility]  If 7PM-7AM, please contact night-coverage [email or web address]  10/25/2020, 3:33 PM   This record has been created using Systems analyst. Errors have been sought and corrected,but may not always be located. Such creation errors do not reflect on the standard of care.

## 2020-10-25 NOTE — Progress Notes (Signed)
[facility]  ROUNDING NOTE   Subjective:   Renee [name] is a 44 years old female with chronic kidney disease, hypertension, obesity, heart failure with preserved ejection fraction, presented to the emergency room on 10/20/2020 with worsening shortness of breath and bilateral lower extremity edema.  Patient appears more awake and pleasant today. She received her first dialysis yesterday,planning for dialysis again today.  Objective:  Vital signs in last 24 hours:  Temp:  [97.5 F (36.4 C)-98.2 F (36.8 C)] 98 F (36.7 C) (10/26 1119) Pulse Rate:  [58-63] 60 (10/26 1119) Resp:  [14-28] 16 (10/26 1119) BP: (133-175)/(71-98) 151/84 (10/26 1119) SpO2:  [92 %-100 %] 95 % (10/26 1119) Weight:  [123.1 kg] 123.1 kg (10/26 0500)  Weight change: 2.942 kg Filed Weights   10/23/20 0341 10/24/20 0325 10/25/20 0500  Weight: 115.3 kg 120.2 kg 123.1 kg    Intake/Output: I/O last 3 completed shifts: In: 508.2 [P.O.:360; I.V.:148.2] Out: 570 [Urine:570]   Intake/Output this shift:  Total I/O In: 240 [P.O.:240] Out: 700 [Urine:700]  Physical Exam: General: In no acute distress  Head: Moist oral mucosal membranes  Eyes: Anicteric  Lungs:   Lungs diminished at the bases, normal respiratory effort  Heart: Regular rate and rhythm  Abdomen:  Soft, non tender,non distended  Extremities:  1+ peripheral edema.  Neurologic:  Oriented x 3  Skin: No acute lesions or rashes  Access:  Rt IJ Permcath    Basic Metabolic Panel: Recent Labs  Lab 10/21/20 0458 10/21/20 0458 10/22/20 0036 10/22/20 1314 10/23/20 0455 10/24/20 0556 10/25/20 0522  NA 136  --  135  --  136 138 134*  K 3.3*  --  3.5  --  3.4* 3.5 3.3*  CL 103  --  103  --  104 106 100  CO2 13*  --  14*  --  15* 14* 17*  GLUCOSE 138*  --  134*  --  93 82 115*  BUN 153*  --  156*  --  155* 160* 121*  CREATININE 13.63*  --  14.22*  --  14.19* 14.45* 11.93*  CALCIUM 6.9*   < > 6.6*   < > 6.3* 6.6* 6.7*  MG 2.6*  --   2.4  --   --   --   --   PHOS 9.7*  --   --   --  10.9* 11.3* 8.5*   < > = values in this interval not displayed.    Liver Function Tests: Recent Labs  Lab 10/22/20 0816 10/23/20 0455 10/24/20 0556 10/25/20 0522  AST 14*  --   --   --   ALT 32  --   --   --   ALKPHOS 116  --   --   --   BILITOT 0.5  --   --   --   PROT 6.2*  --   --   --   ALBUMIN 2.9* 2.9* 2.9* 3.0*   No results for input(s): LIPASE, AMYLASE in the last 168 hours. No results for input(s): AMMONIA in the last 168 hours.  CBC: Recent Labs  Lab 10/22/20 0036 10/22/20 0816 10/23/20 0455 10/24/20 0557 10/25/20 0522  WBC 7.3 6.0 6.3 7.8 8.2  HGB 6.9* 6.8* 6.6* 8.0* 8.0*  HCT 22.7* 21.8* 22.1* 25.8* 25.9*  MCV 75.7* 74.1* 75.9* 75.2* 76.4*  PLT 272 248 259 282 268    Cardiac Enzymes: No results for input(s): CKTOTAL, CKMB, CKMBINDEX, TROPONINI in the last 168 hours.  BNP:  Invalid input(s): POCBNP  CBG: Recent Labs  Lab 10/24/20 1455 10/24/20 1805 10/24/20 2037 10/25/20 0751 10/25/20 1120  GLUCAP 96 81 94 101* 126*    Microbiology: Results for orders placed or performed during the hospital encounter of 10/21/20  Respiratory Panel by RT PCR (Flu A&B, Covid) - Nasopharyngeal Swab     Status: None   Collection Time: 10/21/20  4:58 AM   Specimen: Nasopharyngeal Swab  Result Value Ref Range Status   SARS Coronavirus 2 by RT PCR NEGATIVE NEGATIVE Final    Comment: (NOTE) SARS-CoV-2 target nucleic acids are NOT DETECTED.  The SARS-CoV-2 RNA is generally detectable in upper respiratoy specimens during the acute phase of infection. The lowest concentration of SARS-CoV-2 viral copies this assay can detect is 131 copies/mL. A negative result does not preclude SARS-Cov-2 infection and should not be used as the sole basis for treatment or other patient management decisions. A negative result may occur with  improper specimen collection/handling, submission of specimen other than nasopharyngeal swab,  presence of viral mutation(s) within the areas targeted by this assay, and inadequate number of viral copies (<131 copies/mL). A negative result must be combined with clinical observations, patient history, and epidemiological information. The expected result is Negative.  Fact Sheet for Patients:  PinkCheek.be  Fact Sheet for Healthcare Providers:  GravelBags.it  This test is no t yet approved or cleared by the [place] FDA and  has been authorized for detection and/or diagnosis of SARS-CoV-2 by FDA under an Emergency Use Authorization (EUA). This EUA will remain  in effect (meaning this test can be used) for the duration of the COVID-19 declaration under Section 564(b)(1) of the Act, 21 U.S.C. section 360bbb-3(b)(1), unless the authorization is terminated or revoked sooner.     Influenza A by PCR NEGATIVE NEGATIVE Final   Influenza B by PCR NEGATIVE NEGATIVE Final    Comment: (NOTE) The Xpert Xpress SARS-CoV-2/FLU/RSV assay is intended as an aid in  the diagnosis of influenza from Nasopharyngeal swab specimens and  should not be used as a sole basis for treatment. Nasal washings and  aspirates are unacceptable for Xpert Xpress SARS-CoV-2/FLU/RSV  testing.  Fact Sheet for Patients: PinkCheek.be  Fact Sheet for Healthcare Providers: GravelBags.it  This test is not yet approved or cleared by the [place] FDA and  has been authorized for detection and/or diagnosis of SARS-CoV-2 by  FDA under an Emergency Use Authorization (EUA). This EUA will remain  in effect (meaning this test can be used) for the duration of the  Covid-19 declaration under Section 564(b)(1) of the Act, 21  U.S.C. section 360bbb-3(b)(1), unless the authorization is  terminated or revoked. Performed at [facility], [address] [place], [place] [address]   Urine Culture      Status: Abnormal   Collection Time: 10/21/20  4:58 AM   Specimen: Urine, Random  Result Value Ref Range Status   Specimen Description   Final    URINE, RANDOM Performed at [facility], [address] [place], [place] [address]    Special Requests   Final    Normal Performed at [facility], [address] [place], [place] [address]    Culture >=100,000 COLONIES/mL ESCHERICHIA COLI (A)  Final   Report Status 10/23/2020 FINAL  Final   Organism ID, Bacteria ESCHERICHIA COLI (A)  Final      Susceptibility   Escherichia coli - MIC*    AMPICILLIN >=32 RESISTANT Resistant     CEFAZOLIN <=  4 SENSITIVE Sensitive     CEFTRIAXONE <=0.25 SENSITIVE Sensitive     CIPROFLOXACIN <=0.25 SENSITIVE Sensitive     GENTAMICIN <=1 SENSITIVE Sensitive     IMIPENEM <=0.25 SENSITIVE Sensitive     NITROFURANTOIN <=16 SENSITIVE Sensitive     TRIMETH/SULFA <=20 SENSITIVE Sensitive     AMPICILLIN/SULBACTAM 16 INTERMEDIATE Intermediate     PIP/TAZO <=4 SENSITIVE Sensitive     * >=100,000 COLONIES/mL ESCHERICHIA COLI  CULTURE, BLOOD (ROUTINE X 2) w Reflex to ID Panel     Status: None (Preliminary result)   Collection Time: 10/21/20 11:36 AM   Specimen: BLOOD  Result Value Ref Range Status   Specimen Description BLOOD BLOOD LEFT HAND  Final   Special Requests   Final    BOTTLES DRAWN AEROBIC AND ANAEROBIC Blood Culture adequate volume   Culture   Final    NO GROWTH 4 DAYS Performed at [facility], [address], [place], [place] [address]    Report Status PENDING  Incomplete  CULTURE, BLOOD (ROUTINE X 2) w Reflex to ID Panel     Status: None (Preliminary result)   Collection Time: 10/21/20  4:10 PM   Specimen: BLOOD  Result Value Ref Range Status   Specimen Description BLOOD RIGHT ANTECUBITAL  Final   Special Requests   Final    BOTTLES DRAWN AEROBIC AND ANAEROBIC Blood Culture adequate volume   Culture   Final    NO GROWTH 4 DAYS Performed at [facility], [address] [place], [place] [address]    Report Status PENDING  Incomplete    Coagulation Studies: Recent Labs    10/24/20 0932  LABPROT 15.8*  INR 1.3*    Urinalysis: No results for input(s): COLORURINE, LABSPEC, PHURINE, GLUCOSEU, HGBUR, BILIRUBINUR, KETONESUR, PROTEINUR, UROBILINOGEN, NITRITE, LEUKOCYTESUR in the last 72 hours.  Invalid input(s): APPERANCEUR    Imaging: PERIPHERAL VASCULAR CATHETERIZATION  Result Date: 10/24/2020 See op note    Medications:   . sodium chloride    . sodium chloride    . [START ON 10/29/2020] ferumoxytol     . amLODipine  10 mg Oral Daily  . aspirin  81 mg Oral Daily  . calcitRIOL  0.25 mcg Oral Once per day on Mon Wed Sat  . calcium carbonate  1,250 mg Oral BID WC  . carvedilol  25 mg Oral BID WC  . cephALEXin  250 mg Oral Q12H  . Chlorhexidine Gluconate Cloth  6 each Topical Q0600  . cloNIDine  0.2 mg Oral TID  . epoetin (EPOGEN/PROCRIT) injection  4,000 Units Subcutaneous Q7 days  . heparin  5,000 Units Subcutaneous Q8H  . hydrALAZINE  50 mg Oral TID  . insulin aspart  0-5 Units Subcutaneous QHS  . insulin aspart  0-9 Units Subcutaneous TID WC  . insulin glargine  13 Units Subcutaneous Daily  . isosorbide mononitrate  30 mg Oral Daily  . levETIRAcetam  500 mg Oral BID  . pneumococcal 23 valent vaccine  0.5 mL Intramuscular Tomorrow-1000  . pravastatin  20 mg Oral q1800  . sodium bicarbonate  650 mg Oral TID  . tuberculin  5 Units Intradermal Once  . Vitamin D (Ergocalciferol)  50,000 Units Oral Q7 days   sodium chloride, sodium chloride, acetaminophen, albuterol, alteplase, calcium carbonate, dextromethorphan-guaiFENesin, heparin, hydrALAZINE, lidocaine (PF), lidocaine-prilocaine, ondansetron (ZOFRAN) IV, ondansetron (ZOFRAN) IV, pentafluoroprop-tetrafluoroeth  Assessment/ Plan  Ms. Kolby Myung is a 44 y.o.  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 -Blood pressure slightly above the goal today -Continue current regimen of antihypertensives -Will discontinue IV Furosemide  #Secondary hyperparathyroidism Phosphorus 8.5 Calcium 6.7 PTH 139 on 08/27/2019 On Calcitriol  #Anemia of chronic kidney disease Hemoglobin 8.0 On Epogen weekly now,received on 10/22/20  #Diabetes Type 2 with CKD HbA1C 5.9 on 10/21/2020 Blood glucose readings within acceptable range Continue  Insulin Aspart and Lantus     LOS: 4 Destan Franchini 10/26/202112:42 PM

## 2020-10-26 ENCOUNTER — Encounter: Payer: Self-pay | Admitting: Internal Medicine

## 2020-10-26 DIAGNOSIS — Z992 Dependence on renal dialysis: Secondary | ICD-10-CM | POA: Diagnosis present

## 2020-10-26 DIAGNOSIS — N186 End stage renal disease: Secondary | ICD-10-CM

## 2020-10-26 LAB — GLUCOSE, CAPILLARY
Glucose-Capillary: 108 mg/dL — ABNORMAL HIGH (ref 70–99)
Glucose-Capillary: 127 mg/dL — ABNORMAL HIGH (ref 70–99)
Glucose-Capillary: 83 mg/dL (ref 70–99)
Glucose-Capillary: 124 mg/dL — ABNORMAL HIGH (ref 70–99)

## 2020-10-26 LAB — CULTURE, BLOOD (ROUTINE X 2)
Culture: NO GROWTH
Culture: NO GROWTH
Special Requests: ADEQUATE
Special Requests: ADEQUATE

## 2020-10-26 LAB — RENAL FUNCTION PANEL
Albumin: 2.7 g/dL — ABNORMAL LOW (ref 3.5–5.0)
Anion gap: 14 (ref 5–15)
BUN: 85 mg/dL — ABNORMAL HIGH (ref 6–20)
CO2: 22 mmol/L (ref 22–32)
Calcium: 7.3 mg/dL — ABNORMAL LOW (ref 8.9–10.3)
Chloride: 101 mmol/L (ref 98–111)
Creatinine, Ser: 9.34 mg/dL — ABNORMAL HIGH (ref 0.44–1.00)
GFR, Estimated: 5 mL/min — ABNORMAL LOW (ref >60)
Glucose, Bld: 127 mg/dL — ABNORMAL HIGH (ref 70–99)
Phosphorus: 6.2 mg/dL — ABNORMAL HIGH (ref 2.5–4.6)
Potassium: 3.3 mmol/L — ABNORMAL LOW (ref 3.5–5.1)
Sodium: 137 mmol/L (ref 135–145)

## 2020-10-26 LAB — CBC
HCT: 25.5 % — ABNORMAL LOW (ref 36.0–46.0)
Hemoglobin: 7.8 g/dL — ABNORMAL LOW (ref 12.0–15.0)
MCH: 23.4 pg — ABNORMAL LOW (ref 26.0–34.0)
MCHC: 30.6 g/dL (ref 30.0–36.0)
MCV: 76.6 fL — ABNORMAL LOW (ref 80.0–100.0)
Platelets: 258 10*3/uL (ref 150–400)
RBC: 3.33 MIL/uL — ABNORMAL LOW (ref 3.87–5.11)
RDW: 17.3 % — ABNORMAL HIGH (ref 11.5–15.5)
WBC: 8.9 10*3/uL (ref 4.0–10.5)
nRBC: 0.4 % — ABNORMAL HIGH (ref 0.0–0.2)

## 2020-10-26 LAB — PHOSPHORUS: Phosphorus: 6.6 mg/dL — ABNORMAL HIGH (ref 2.5–4.6)

## 2020-10-26 LAB — HEPATITIS B SURFACE ANTIBODY, QUANTITATIVE: Hep B S AB Quant (Post): <3.1 m[IU]/mL — ABNORMAL LOW (ref >9.9)

## 2020-10-26 LAB — HEPATITIS B DNA, ULTRAQUANTITATIVE, PCR
HBV DNA SERPL PCR-ACNC: NOT DETECTED IU/mL
HBV DNA SERPL PCR-LOG IU: UNDETERMINED log10 IU/mL

## 2020-10-26 MED ORDER — MELATONIN 5 MG PO TABS
5.0000 mg | ORAL_TABLET | Freq: Every day | ORAL | Status: DC
  Filled 2020-10-26: qty 1

## 2020-10-26 NOTE — Progress Notes (Signed)
[facility]  ROUNDING NOTE   Subjective:   [name] is a 44 years old female with chronic kidney disease, hypertension, obesity, heart failure with preserved ejection fraction, presented to the emergency room on 10/20/2020 with worsening shortness of breath and bilateral lower extremity edema.  Patient found resting in bed, husband at bedside.  She reports feeling much better today, still has some blurred vision, she feels like it is somewhat improving, but not resolved.    Objective:  Vital signs in last 24 hours:  Temp:  [97.5 F (36.4 C)-98.6 F (37 C)] 98.6 F (37 C) (10/27 1230) Pulse Rate:  [60-65] 60 (10/27 1515) Resp:  [16-19] 18 (10/27 1515) BP: (147-162)/(75-87) 150/81 (10/27 1515) SpO2:  [93 %-100 %] 100 % (10/27 1515) Weight:  [122 kg] 122 kg (10/27 0157)  Weight change: -1.1 kg Filed Weights   10/24/20 0325 10/25/20 0500 10/26/20 0157  Weight: 120.2 kg 123.1 kg 122 kg    Intake/Output: I/O last 3 completed shifts: In: 240 [P.O.:240] Out: 1800 [Urine:1800]   Intake/Output this shift:  Total I/O In: 240 [P.O.:240] Out: -   Physical Exam: General: Sitting up in bed, spouse at bedside  Head:  Normocephalic, atraumatic  Eyes: Anicteric  Lungs:   Respirations even and unlabored, lungs diminished   Heart:  S1S2, no rubs or gallops  Abdomen:   Obese, nontender  Extremities:  1+  pitting edema in bilateral lower extremities  Neurologic:  Alert, awake, speech clear and appropriate  Skin: No acute lesions or rashes  Access:  Rt IJ Permcath    Basic Metabolic Panel: Recent Labs  Lab 10/21/20 0458 10/21/20 0458 10/22/20 0036 10/22/20 1314 10/23/20 0455 10/23/20 0455 10/24/20 0556 10/25/20 0522 10/26/20 0616 10/26/20 1119  NA 136   < > 135  --  136  --  138 134* 137  --   K 3.3*   < > 3.5  --  3.4*  --  3.5 3.3* 3.3*  --   CL 103   < > 103  --  104  --  106 100 101  --   CO2 13*   < > 14*  --  15*  --  14* 17* 22  --   GLUCOSE 138*   <  > 134*  --  93  --  82 115* 127*  --   BUN 153*   < > 156*  --  155*  --  160* 121* 85*  --   CREATININE 13.63*   < > 14.22*  --  14.19*  --  14.45* 11.93* 9.34*  --   CALCIUM 6.9*   < > 6.6*   < > 6.3*   < > 6.6* 6.7* 7.3*  --   MG 2.6*  --  2.4  --   --   --   --   --   --   --   PHOS 9.7*   < >  --   --  10.9*  --  11.3* 8.5* 6.2* 6.6*   < > = values in this interval not displayed.    Liver Function Tests: Recent Labs  Lab 10/22/20 0816 10/23/20 0455 10/24/20 0556 10/25/20 0522 10/26/20 0616  AST 14*  --   --   --   --   ALT 32  --   --   --   --   ALKPHOS 116  --   --   --   --   BILITOT 0.5  --   --   --   --  PROT 6.2*  --   --   --   --   ALBUMIN 2.9* 2.9* 2.9* 3.0* 2.7*   No results for input(s): LIPASE, AMYLASE in the last 168 hours. No results for input(s): AMMONIA in the last 168 hours.  CBC: Recent Labs  Lab 10/22/20 0816 10/23/20 0455 10/24/20 0557 10/25/20 0522 10/26/20 0616  WBC 6.0 6.3 7.8 8.2 8.9  HGB 6.8* 6.6* 8.0* 8.0* 7.8*  HCT 21.8* 22.1* 25.8* 25.9* 25.5*  MCV 74.1* 75.9* 75.2* 76.4* 76.6*  PLT 248 259 282 268 258    Cardiac Enzymes: No results for input(s): CKTOTAL, CKMB, CKMBINDEX, TROPONINI in the last 168 hours.  BNP: Invalid input(s): POCBNP  CBG: Recent Labs  Lab 10/25/20 1120 10/25/20 1648 10/25/20 2126 10/26/20 0748 10/26/20 1140  GLUCAP 126* 111* 129* 108* 127*    Microbiology: Results for orders placed or performed during the hospital encounter of 10/21/20  Respiratory Panel by RT PCR (Flu A&B, Covid) - Nasopharyngeal Swab     Status: None   Collection Time: 10/21/20  4:58 AM   Specimen: Nasopharyngeal Swab  Result Value Ref Range Status   SARS Coronavirus 2 by RT PCR NEGATIVE NEGATIVE Final    Comment: (NOTE) SARS-CoV-2 target nucleic acids are NOT DETECTED.  The SARS-CoV-2 RNA is generally detectable in upper respiratoy specimens during the acute phase of infection. The lowest concentration of SARS-CoV-2 viral  copies this assay can detect is 131 copies/mL. A negative result does not preclude SARS-Cov-2 infection and should not be used as the sole basis for treatment or other patient management decisions. A negative result may occur with  improper specimen collection/handling, submission of specimen other than nasopharyngeal swab, presence of viral mutation(s) within the areas targeted by this assay, and inadequate number of viral copies (<131 copies/mL). A negative result must be combined with clinical observations, patient history, and epidemiological information. The expected result is Negative.  Fact Sheet for Patients:  PinkCheek.be  Fact Sheet for Healthcare Providers:  GravelBags.it  This test is no t yet approved or cleared by the [place] FDA and  has been authorized for detection and/or diagnosis of SARS-CoV-2 by FDA under an Emergency Use Authorization (EUA). This EUA will remain  in effect (meaning this test can be used) for the duration of the COVID-19 declaration under Section 564(b)(1) of the Act, 21 U.S.C. section 360bbb-3(b)(1), unless the authorization is terminated or revoked sooner.     Influenza A by PCR NEGATIVE NEGATIVE Final   Influenza B by PCR NEGATIVE NEGATIVE Final    Comment: (NOTE) The Xpert Xpress SARS-CoV-2/FLU/RSV assay is intended as an aid in  the diagnosis of influenza from Nasopharyngeal swab specimens and  should not be used as a sole basis for treatment. Nasal washings and  aspirates are unacceptable for Xpert Xpress SARS-CoV-2/FLU/RSV  testing.  Fact Sheet for Patients: PinkCheek.be  Fact Sheet for Healthcare Providers: GravelBags.it  This test is not yet approved or cleared by the [place] FDA and  has been authorized for detection and/or diagnosis of SARS-CoV-2 by  FDA under an Emergency Use Authorization (EUA). This  EUA will remain  in effect (meaning this test can be used) for the duration of the  Covid-19 declaration under Section 564(b)(1) of the Act, 21  U.S.C. section 360bbb-3(b)(1), unless the authorization is  terminated or revoked. Performed at [facility], [address], [place], [place] [address]   Urine Culture     Status: Abnormal   Collection Time: 10/21/20  4:58 AM   Specimen: Urine, Random  Result Value Ref Range Status   Specimen Description   Final    URINE, RANDOM Performed at [facility], [address] [place], [place] [address]    Special Requests   Final    Normal Performed at [facility], [address] [place], [place] [address]    Culture >=100,000 COLONIES/mL ESCHERICHIA COLI (A)  Final   Report Status 10/23/2020 FINAL  Final   Organism ID, Bacteria ESCHERICHIA COLI (A)  Final      Susceptibility   Escherichia coli - MIC*    AMPICILLIN >=32 RESISTANT Resistant     CEFAZOLIN <=4 SENSITIVE Sensitive     CEFTRIAXONE <=0.25 SENSITIVE Sensitive     CIPROFLOXACIN <=0.25 SENSITIVE Sensitive     GENTAMICIN <=1 SENSITIVE Sensitive     IMIPENEM <=0.25 SENSITIVE Sensitive     NITROFURANTOIN <=16 SENSITIVE Sensitive     TRIMETH/SULFA <=20 SENSITIVE Sensitive     AMPICILLIN/SULBACTAM 16 INTERMEDIATE Intermediate     PIP/TAZO <=4 SENSITIVE Sensitive     * >=100,000 COLONIES/mL ESCHERICHIA COLI  CULTURE, BLOOD (ROUTINE X 2) w Reflex to ID Panel     Status: None   Collection Time: 10/21/20 11:36 AM   Specimen: BLOOD  Result Value Ref Range Status   Specimen Description BLOOD BLOOD LEFT HAND  Final   Special Requests   Final    BOTTLES DRAWN AEROBIC AND ANAEROBIC Blood Culture adequate volume   Culture   Final    NO GROWTH 5 DAYS Performed at [facility], [address] [place], [place] [address]    Report Status 10/26/2020 FINAL  Final  CULTURE, BLOOD (ROUTINE X 2) w Reflex to ID Panel     Status: None   Collection Time:  10/21/20  4:10 PM   Specimen: BLOOD  Result Value Ref Range Status   Specimen Description BLOOD RIGHT ANTECUBITAL  Final   Special Requests   Final    BOTTLES DRAWN AEROBIC AND ANAEROBIC Blood Culture adequate volume   Culture   Final    NO GROWTH 5 DAYS Performed at [facility], [address] [place], [place] [address]    Report Status 10/26/2020 FINAL  Final    Coagulation Studies: Recent Labs    10/24/20 0932  LABPROT 15.8*  INR 1.3*    Urinalysis: No results for input(s): COLORURINE, LABSPEC, PHURINE, GLUCOSEU, HGBUR, BILIRUBINUR, KETONESUR, PROTEINUR, UROBILINOGEN, NITRITE, LEUKOCYTESUR in the last 72 hours.  Invalid input(s): APPERANCEUR    Imaging: No results found.   Medications:    sodium chloride     sodium chloride     [START ON 10/29/2020] ferumoxytol      amLODipine  10 mg Oral Daily   aspirin  81 mg Oral Daily   calcitRIOL  0.25 mcg Oral Once per day on Mon Wed Sat   calcium carbonate  1,250 mg Oral BID WC   carvedilol  25 mg Oral BID WC   Chlorhexidine Gluconate Cloth  6 each Topical Q0600   cloNIDine  0.2 mg Oral TID   epoetin (EPOGEN/PROCRIT) injection  4,000 Units Subcutaneous Q7 days   heparin  5,000 Units Subcutaneous Q8H   hydrALAZINE  50 mg Oral TID   insulin aspart  0-5 Units Subcutaneous QHS   insulin aspart  0-9 Units Subcutaneous TID WC   insulin glargine  13 Units Subcutaneous Daily   isosorbide mononitrate  30 mg Oral Daily   levETIRAcetam  500 mg  Oral BID   melatonin  5 mg Oral QHS   pneumococcal 23 valent vaccine  0.5 mL Intramuscular Tomorrow-1000   pravastatin  20 mg Oral q1800   sodium bicarbonate  650 mg Oral TID   Vitamin D (Ergocalciferol)  50,000 Units Oral Q7 days   sodium chloride, sodium chloride, acetaminophen, albuterol, alteplase, calcium carbonate, dextromethorphan-guaiFENesin, heparin, hydrALAZINE, lidocaine (PF), lidocaine-prilocaine, ondansetron (ZOFRAN) IV, ondansetron  (ZOFRAN) IV, pentafluoroprop-tetrafluoroeth  Assessment/ Plan  [name] is a 44 y.o.  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  Baseline creatinine on 01/23/2020 4.90 GFR 12 Lab Results  Component Value Date   CREATININE 9.34 (H) 10/26/2020   CREATININE 11.93 (H) 10/25/2020   CREATININE 14.45 (H) 10/24/2020   Patient received her second dialysis yesterday, tolerated the treatment very well We are planning for dialysis again today Patient has complaints of soreness on her right IJ PermCath site, site with dressing, appears clean dry and intact She has another open area on her right neck, which appears clean now, but spouse noticed some drainage yesterday, advised to notify RN if he see any more drainage or redness in that area  # Hypertensive Crisis # Heart failure with preserved ejection fraction IV Lasix discontinued yesterday Patient's blood pressure in acceptable range Respiratory status improving, but reports dyspnea on exertion  #Secondary hyperparathyroidism Patient's calcium level today 7.3 and phosphorus 6.6 We will continue monitoring bone mineral parameters closely, while hospitalized  #Anemia of chronic kidney disease Her hemoglobin level stays low 7.8 today She is on weekly Epogen now, plan to schedule it on her dialysis days once the dialysis schedule is established  #Diabetes Type 2 with CKD Diabetes is controlled with current regimen Management per medical team     LOS: 5 Rakeem Colley 10/27/20213:32 PM

## 2020-10-26 NOTE — Progress Notes (Addendum)
Progress Note    Renee [name]  BHA:[id number] DOB: May 14, 1976  DOA: 10/21/2020 PCP: Minda Ditto, MD      Brief Narrative:    Medical records reviewed and are as summarized below:  Renee [name] is a 44 y.o. female       Assessment/Plan:   Principal Problem:   ARF (acute renal failure) ([place]) Active Problems:   Hypokalemia   Hypertensive emergency   Chronic diastolic heart failure ([place])   HTN (hypertension)   Acute pulmonary edema (HCC)   Type II diabetes mellitus with renal manifestations (HCC)   Chest pain   Elevated troponin   UTI (urinary tract infection)   Hypocalcemia   HLD (hyperlipidemia)   ESRD (end stage renal disease) (HCC)   Body mass index is 44.76 kg/m.  (Morbid obesity): This complicates overall care and prognosis  AKI on CKD stage V/ESRD: s/p placement of hemodialysis catheter via right IJ vein on 10/24/2020.  She started hemodialysis on 10/24/2020.  She will have hemodialysis today.  Follow-up with nephrologist for further recommendations.  Secondary hyperparathyroidism, hypocalcemia hyperphosphatemia: Continue calcitriol, calcium carbonate and vitamin D supplement  Hypertension with hypertensive emergency on admission: BP has improved.  Continue antihypertensives  Chronic diastolic CHF with recent flash pulmonary edema: 2D echo in August 2020 showed EF of 55 to 60%.  Fluid management with hemodialysis.  Anemia of chronic kidney disease: Status post transfusion with 1 unit of PRBCs and IV iron infusion.  Continue Epogen.  Type II DM with recent hypoglycemia: Decrease Lantus to 10 units daily.  Continue Humalog as needed for hyperglycemia.  Hypokalemia: Replete potassium as needed.  Defer to nephrologist for treatment.  UTI: Completed 5-day course of antibiotics    Diet Order            Diet renal/carb modified with fluid restriction Diet-HS Snack? Nothing; Fluid restriction: 1200 mL Fluid; Room service appropriate? Yes; Fluid  consistency: Thin  Diet effective now                    Consultants:  Nephrologist  Vascular surgeon  Procedures:  Placement of cuff tunneled hemodialysis catheter via right IJ vein on 10/24/2020    Medications:   . amLODipine  10 mg Oral Daily  . aspirin  81 mg Oral Daily  . calcitRIOL  0.25 mcg Oral Once per day on Mon Wed Sat  . calcium carbonate  1,250 mg Oral BID WC  . carvedilol  25 mg Oral BID WC  . Chlorhexidine Gluconate Cloth  6 each Topical Q0600  . cloNIDine  0.2 mg Oral TID  . epoetin (EPOGEN/PROCRIT) injection  4,000 Units Subcutaneous Q7 days  . heparin  5,000 Units Subcutaneous Q8H  . hydrALAZINE  50 mg Oral TID  . insulin aspart  0-5 Units Subcutaneous QHS  . insulin aspart  0-9 Units Subcutaneous TID WC  . insulin glargine  13 Units Subcutaneous Daily  . isosorbide mononitrate  30 mg Oral Daily  . levETIRAcetam  500 mg Oral BID  . melatonin  5 mg Oral QHS  . pneumococcal 23 valent vaccine  0.5 mL Intramuscular Tomorrow-1000  . pravastatin  20 mg Oral q1800  . sodium bicarbonate  650 mg Oral TID  . Vitamin D (Ergocalciferol)  50,000 Units Oral Q7 days   Continuous Infusions: . sodium chloride    . sodium chloride    . [START ON 10/29/2020] ferumoxytol       Anti-infectives (From admission, onward)  Start     Dose/Rate Route Frequency Ordered Stop   10/25/20 0000  ceFAZolin (ANCEF) IVPB 1 g/50 mL premix       Note to Pharmacy: To be given in specials   1 g 100 mL/hr over 30 Minutes Intravenous  Once 10/24/20 1308 10/24/20 1450   10/24/20 1000  cephALEXin (KEFLEX) capsule 250 mg        250 mg Oral Every 12 hours 10/23/20 1137 10/25/20 2132   10/22/20 0600  cefTRIAXone (ROCEPHIN) 2 g in sodium chloride 0.9 % 100 mL IVPB  Status:  Discontinued        2 g 200 mL/hr over 30 Minutes Intravenous Every 24 hours 10/21/20 0836 10/23/20 1137   10/21/20 0600  cefTRIAXone (ROCEPHIN) 1 g in sodium chloride 0.9 % 100 mL IVPB        1 g 200 mL/hr  over 30 Minutes Intravenous STAT 10/21/20 0545 10/21/20 1001             Family Communication/Anticipated D/C date and plan/Code Status   DVT prophylaxis: heparin injection 5,000 Units Start: 10/25/20 2200     Code Status: Full Code  Family Communication: Plan discussed with her husband at the bedside Disposition Plan:    Status is: Inpatient  Remains inpatient appropriate because:Inpatient level of care appropriate due to severity of illness   Dispo: The patient is from: Home              Anticipated d/c is to: Home              Anticipated d/c date is: 2 days              Patient currently is not medically stable to d/c.           Subjective:   She complains of bilateral leg swelling.  No shortness of breath or chest pain.  Objective:    Vitals:   10/26/20 1230 10/26/20 1245 10/26/20 1515 10/26/20 1626  BP: (!) 160/87 (!) 162/82 (!) 150/81 (!) 152/70  Pulse: 61  60 64  Resp:   18 17  Temp: 98.6 F (37 C)     TempSrc: Oral   Oral  SpO2: 100%  100% 100%  Weight:      Height:       No data found.   Intake/Output Summary (Last 24 hours) at 10/26/2020 1746 Last data filed at 10/26/2020 1033 Gross per 24 hour  Intake 240 ml  Output 1100 ml  Net -860 ml   Filed Weights   10/24/20 0325 10/25/20 0500 10/26/20 0157  Weight: 120.2 kg 123.1 kg 122 kg    Exam:  GEN: NAD SKIN: No rash EYES: EOMI ENT: MMM CV: RRR PULM: CTA B ABD: soft, obese, NT, +BS CNS: AAO x 3, non focal EXT: Bilateral leg edema extending from the thighs to the feet.  No tenderness or erythema.    Data Reviewed:   I have personally reviewed following labs and imaging studies:  Labs: Labs show the following:   Basic Metabolic Panel: Recent Labs  Lab 10/21/20 0458 10/21/20 0458 10/22/20 0036 10/22/20 1314 10/23/20 0455 10/23/20 0455 10/24/20 0556 10/24/20 0556 10/25/20 0522 10/26/20 0616 10/26/20 1119  NA 136   < > 135  --  136  --  138  --  134* 137  --    K 3.3*   < > 3.5  --  3.4*   < > 3.5   < > 3.3* 3.3*  --  CL 103   < > 103  --  104  --  106  --  100 101  --   CO2 13*   < > 14*  --  15*  --  14*  --  17* 22  --   GLUCOSE 138*   < > 134*  --  93  --  82  --  115* 127*  --   BUN 153*   < > 156*  --  155*  --  160*  --  121* 85*  --   CREATININE 13.63*   < > 14.22*  --  14.19*  --  14.45*  --  11.93* 9.34*  --   CALCIUM 6.9*   < > 6.6* 6.4* 6.3*  --  6.6*  --  6.7* 7.3*  --   MG 2.6*  --  2.4  --   --   --   --   --   --   --   --   PHOS 9.7*   < >  --   --  10.9*  --  11.3*  --  8.5* 6.2* 6.6*   < > = values in this interval not displayed.   GFR Estimated Creatinine Clearance: 10 mL/min (A) (by C-G formula based on SCr of 9.34 mg/dL (H)). Liver Function Tests: Recent Labs  Lab 10/22/20 0816 10/23/20 0455 10/24/20 0556 10/25/20 0522 10/26/20 0616  AST 14*  --   --   --   --   ALT 32  --   --   --   --   ALKPHOS 116  --   --   --   --   BILITOT 0.5  --   --   --   --   PROT 6.2*  --   --   --   --   ALBUMIN 2.9* 2.9* 2.9* 3.0* 2.7*   No results for input(s): LIPASE, AMYLASE in the last 168 hours. No results for input(s): AMMONIA in the last 168 hours. Coagulation profile Recent Labs  Lab 10/21/20 1145 10/24/20 0932  INR 1.3* 1.3*    CBC: Recent Labs  Lab 10/22/20 0816 10/23/20 0455 10/24/20 0557 10/25/20 0522 10/26/20 0616  WBC 6.0 6.3 7.8 8.2 8.9  HGB 6.8* 6.6* 8.0* 8.0* 7.8*  HCT 21.8* 22.1* 25.8* 25.9* 25.5*  MCV 74.1* 75.9* 75.2* 76.4* 76.6*  PLT 248 259 282 268 258   Cardiac Enzymes: No results for input(s): CKTOTAL, CKMB, CKMBINDEX, TROPONINI in the last 168 hours. BNP (last 3 results) No results for input(s): PROBNP in the last 8760 hours. CBG: Recent Labs  Lab 10/25/20 1648 10/25/20 2126 10/26/20 0748 10/26/20 1140 10/26/20 1654  GLUCAP 111* 129* 108* 127* 83   D-Dimer: No results for input(s): DDIMER in the last 72 hours. Hgb A1c: No results for input(s): HGBA1C in the last 72  hours. Lipid Profile: No results for input(s): CHOL, HDL, LDLCALC, TRIG, CHOLHDL, LDLDIRECT in the last 72 hours. Thyroid function studies: No results for input(s): TSH, T4TOTAL, T3FREE, THYROIDAB in the last 72 hours.  Invalid input(s): FREET3 Anemia work up: No results for input(s): VITAMINB12, FOLATE, FERRITIN, TIBC, IRON, RETICCTPCT in the last 72 hours. Sepsis Labs: Recent Labs  Lab 10/23/20 0455 10/24/20 0557 10/25/20 0522 10/26/20 0616  WBC 6.3 7.8 8.2 8.9    Microbiology Recent Results (from the past 240 hour(s))  Respiratory Panel by RT PCR (Flu A&B, Covid) - Nasopharyngeal Swab     Status: None  Collection Time: 10/21/20  4:58 AM   Specimen: Nasopharyngeal Swab  Result Value Ref Range Status   SARS Coronavirus 2 by RT PCR NEGATIVE NEGATIVE Final    Comment: (NOTE) SARS-CoV-2 target nucleic acids are NOT DETECTED.  The SARS-CoV-2 RNA is generally detectable in upper respiratoy specimens during the acute phase of infection. The lowest concentration of SARS-CoV-2 viral copies this assay can detect is 131 copies/mL. A negative result does not preclude SARS-Cov-2 infection and should not be used as the sole basis for treatment or other patient management decisions. A negative result may occur with  improper specimen collection/handling, submission of specimen other than nasopharyngeal swab, presence of viral mutation(s) within the areas targeted by this assay, and inadequate number of viral copies (<131 copies/mL). A negative result must be combined with clinical observations, patient history, and epidemiological information. The expected result is Negative.  Fact Sheet for Patients:  PinkCheek.be  Fact Sheet for Healthcare Providers:  GravelBags.it  This test is no t yet approved or cleared by the [place] FDA and  has been authorized for detection and/or diagnosis of SARS-CoV-2 by FDA under an  Emergency Use Authorization (EUA). This EUA will remain  in effect (meaning this test can be used) for the duration of the COVID-19 declaration under Section 564(b)(1) of the Act, 21 U.S.C. section 360bbb-3(b)(1), unless the authorization is terminated or revoked sooner.     Influenza A by PCR NEGATIVE NEGATIVE Final   Influenza B by PCR NEGATIVE NEGATIVE Final    Comment: (NOTE) The Xpert Xpress SARS-CoV-2/FLU/RSV assay is intended as an aid in  the diagnosis of influenza from Nasopharyngeal swab specimens and  should not be used as a sole basis for treatment. Nasal washings and  aspirates are unacceptable for Xpert Xpress SARS-CoV-2/FLU/RSV  testing.  Fact Sheet for Patients: PinkCheek.be  Fact Sheet for Healthcare Providers: GravelBags.it  This test is not yet approved or cleared by the [place] FDA and  has been authorized for detection and/or diagnosis of SARS-CoV-2 by  FDA under an Emergency Use Authorization (EUA). This EUA will remain  in effect (meaning this test can be used) for the duration of the  Covid-19 declaration under Section 564(b)(1) of the Act, 21  U.S.C. section 360bbb-3(b)(1), unless the authorization is  terminated or revoked. Performed at [facility], [address], [place], [place] [address]   Urine Culture     Status: Abnormal   Collection Time: 10/21/20  4:58 AM   Specimen: Urine, Random  Result Value Ref Range Status   Specimen Description   Final    URINE, RANDOM Performed at [facility], [address] [place], [place] [address]    Special Requests   Final    Normal Performed at [facility], [address] [place], [place] [address]    Culture >=100,000 COLONIES/mL ESCHERICHIA COLI (A)  Final   Report Status 10/23/2020 FINAL  Final   Organism ID, Bacteria ESCHERICHIA COLI (A)  Final      Susceptibility   Escherichia coli - MIC*    AMPICILLIN  >=32 RESISTANT Resistant     CEFAZOLIN <=4 SENSITIVE Sensitive     CEFTRIAXONE <=0.25 SENSITIVE Sensitive     CIPROFLOXACIN <=0.25 SENSITIVE Sensitive     GENTAMICIN <=1 SENSITIVE Sensitive     IMIPENEM <=0.25 SENSITIVE Sensitive     NITROFURANTOIN <=16 SENSITIVE Sensitive     TRIMETH/SULFA <=20 SENSITIVE Sensitive     AMPICILLIN/SULBACTAM 16 INTERMEDIATE Intermediate  PIP/TAZO <=4 SENSITIVE Sensitive     * >=100,000 COLONIES/mL ESCHERICHIA COLI  CULTURE, BLOOD (ROUTINE X 2) w Reflex to ID Panel     Status: None   Collection Time: 10/21/20 11:36 AM   Specimen: BLOOD  Result Value Ref Range Status   Specimen Description BLOOD BLOOD LEFT HAND  Final   Special Requests   Final    BOTTLES DRAWN AEROBIC AND ANAEROBIC Blood Culture adequate volume   Culture   Final    NO GROWTH 5 DAYS Performed at [facility], [address] [place], [place] [address]    Report Status 10/26/2020 FINAL  Final  CULTURE, BLOOD (ROUTINE X 2) w Reflex to ID Panel     Status: None   Collection Time: 10/21/20  4:10 PM   Specimen: BLOOD  Result Value Ref Range Status   Specimen Description BLOOD RIGHT ANTECUBITAL  Final   Special Requests   Final    BOTTLES DRAWN AEROBIC AND ANAEROBIC Blood Culture adequate volume   Culture   Final    NO GROWTH 5 DAYS Performed at [facility], [address] [place], [place] [address]    Report Status 10/26/2020 FINAL  Final    Procedures and diagnostic studies:  No results found.             LOS: 5 days   Eriyanna Kofoed  [facility]   Pager on [email or web address]. If 7PM-7AM, please contact night-coverage at [email or web address]     10/26/2020, 5:46 PM

## 2020-10-26 NOTE — Progress Notes (Signed)
New to Hemodialysis patient, needing outpatient clinic set up. Patient clinically accepted at [place] MWF 11:15am. Patient can start on Friday 10/29 at 10:45 if medically stable for discharge. Please contact me when patient is ready for discharge, so that coordination can be made with clinic for start date.   Elvera Bicker Dialysis Coordinator [phone]

## 2020-10-27 LAB — RENAL FUNCTION PANEL
Albumin: 2.8 g/dL — ABNORMAL LOW (ref 3.5–5.0)
Anion gap: 11 (ref 5–15)
BUN: 51 mg/dL — ABNORMAL HIGH (ref 6–20)
CO2: 24 mmol/L (ref 22–32)
Calcium: 7.4 mg/dL — ABNORMAL LOW (ref 8.9–10.3)
Chloride: 104 mmol/L (ref 98–111)
Creatinine, Ser: 6.65 mg/dL — ABNORMAL HIGH (ref 0.44–1.00)
GFR, Estimated: 7 mL/min — ABNORMAL LOW (ref >60)
Glucose, Bld: 106 mg/dL — ABNORMAL HIGH (ref 70–99)
Phosphorus: 5.4 mg/dL — ABNORMAL HIGH (ref 2.5–4.6)
Potassium: 3.5 mmol/L (ref 3.5–5.1)
Sodium: 139 mmol/L (ref 135–145)

## 2020-10-27 LAB — CBC
HCT: 25.4 % — ABNORMAL LOW (ref 36.0–46.0)
Hemoglobin: 7.8 g/dL — ABNORMAL LOW (ref 12.0–15.0)
MCH: 24.1 pg — ABNORMAL LOW (ref 26.0–34.0)
MCHC: 30.7 g/dL (ref 30.0–36.0)
MCV: 78.4 fL — ABNORMAL LOW (ref 80.0–100.0)
Platelets: 261 10*3/uL (ref 150–400)
RBC: 3.24 MIL/uL — ABNORMAL LOW (ref 3.87–5.11)
RDW: 17.2 % — ABNORMAL HIGH (ref 11.5–15.5)
WBC: 8.6 10*3/uL (ref 4.0–10.5)
nRBC: 0 % (ref 0.0–0.2)

## 2020-10-27 LAB — GLUCOSE, CAPILLARY
Glucose-Capillary: 106 mg/dL — ABNORMAL HIGH (ref 70–99)
Glucose-Capillary: 194 mg/dL — ABNORMAL HIGH (ref 70–99)

## 2020-10-27 MED ORDER — HYDRALAZINE HCL 50 MG PO TABS: 50.0000 mg | ORAL_TABLET | Freq: Three times a day (TID) | ORAL | 0 refills | Status: DC

## 2020-10-27 MED ORDER — EPOETIN ALFA 10000 UNIT/ML IJ SOLN: 10000.0000 [IU] | INTRAMUSCULAR | Status: DC

## 2020-10-27 MED ORDER — INSULIN GLARGINE 100 UNIT/ML ~~LOC~~ SOLN: 15.0000 [IU] | Freq: Every day | SUBCUTANEOUS | Status: AC

## 2020-10-27 MED ORDER — ISOSORBIDE MONONITRATE ER 30 MG PO TB24: 30.0000 mg | ORAL_TABLET | Freq: Every day | ORAL | 0 refills | Status: DC

## 2020-10-27 MED ORDER — CALCIUM CARBONATE 1250 (500 CA) MG PO TABS: 1250.0000 mg | ORAL_TABLET | Freq: Two times a day (BID) | ORAL | 0 refills | Status: AC

## 2020-10-27 MED ORDER — VITAMIN D (ERGOCALCIFEROL) 1.25 MG (50000 UNIT) PO CAPS: 50000.0000 [IU] | ORAL_CAPSULE | ORAL | 0 refills | Status: AC

## 2020-10-27 MED ORDER — ORAL CARE MOUTH RINSE
15.0000 mL | Freq: Two times a day (BID) | OROMUCOSAL | Status: DC
  Administered 2020-10-27: 15 mL via OROMUCOSAL

## 2020-10-27 NOTE — TOC Initial Note (Signed)
Transition of Care [facility]) - Initial/Assessment Note    Patient Details  Name: Renee [name] MRN: [id number] Date of Birth: Sep 08, 1976  [facility]) CM/SW Contact:    Eileen Stanford, LCSW Phone Number: 10/27/2020, 9:28 AM  Clinical Narrative:   Pt she lives with her daughter and spouse. Pt states they transport her to her dr apts. Pt states she [facility]. Pt states she has a established PCP. Pt is not currently getting any home services.  Pt states she does not have a scale at home--CSW will provide at bedside. Pt is aware to weigh herself daily and notify her MD if there is significant weight gain. Pt has a Copywriter, advertising.                Expected Discharge Plan: Home/Self Care Barriers to Discharge: Continued Medical Work up   Patient Goals and CMS Choice Patient states their goals for this hospitalization and ongoing recovery are:: to get better      Expected Discharge Plan and Services Expected Discharge Plan: Home/Self Care In-house Referral: Clinical Social Work   Post Acute Care Choice: NA Living arrangements for the past 2 months: Single Family Home                                      Prior Living Arrangements/Services Living arrangements for the past 2 months: Single Family Home Lives with:: Adult Children, Spouse Patient language and need for interpreter reviewed:: Yes Do you feel safe going back to the place where you live?: Yes      Need for Family Participation in Patient Care: Yes (Comment) Care giver support system in place?: Yes (comment)   Criminal Activity/Legal Involvement Pertinent to Current Situation/Hospitalization: No - Comment as needed  Activities of Daily Living Home Assistive Devices/Equipment: None ADL Screening (condition at time of admission) Patient's cognitive ability adequate to safely complete daily activities?: Yes Is the patient deaf or have difficulty hearing?: No Does the patient  have difficulty seeing, even when wearing glasses/contacts?: No Does the patient have difficulty concentrating, remembering, or making decisions?: No Patient able to express need for assistance with ADLs?: Yes Does the patient have difficulty dressing or bathing?: Yes Independently performs ADLs?: No Does the patient have difficulty walking or climbing stairs?: Yes Weakness of Legs: Both Weakness of Arms/Hands: Both  Permission Sought/Granted Permission sought to share information with : Family Supports    Share Information with NAME: Dedi     Permission granted to share info w Relationship: daughter     Emotional Assessment Appearance:: Appears stated age Attitude/Demeanor/Rapport: Engaged Affect (typically observed): Accepting, Appropriate Orientation: : Oriented to Self, Oriented to Place, Oriented to  Time, Oriented to Situation Alcohol / Substance Use: Not Applicable Psych Involvement: No (comment)  Admission diagnosis:  Hypermagnesemia [E83.41] Hyperphosphatemia [E83.39] Acute pulmonary edema (HCC) [J81.0] ARF (acute renal failure) (HCC) [N17.9] Elevated troponin level [R77.8] Acute CHF (congestive heart failure) (HCC) [I50.9] Chronic anemia [D64.9] Hypertensive emergency [I16.1] Urinary tract infection without hematuria, site unspecified [N39.0] Acute renal failure superimposed on stage 4 chronic kidney disease (HCC) [N17.9, N18.4] Anasarca associated with disorder of kidney [N04.9] Acute renal failure superimposed on stage 4 chronic kidney disease, unspecified acute renal failure type ([place]) [N17.9, N18.4] Patient Active Problem List   Diagnosis Date Noted  . ESRD (end stage renal disease) ([place]) 10/26/2020  . Acute CHF (congestive heart  failure) ([place]) 10/21/2020  . ARF (acute renal failure) ([place]) 10/21/2020  . Type II diabetes mellitus with renal manifestations ([place]) 10/21/2020  . Chest pain 10/21/2020  . Elevated troponin 10/21/2020  . UTI (urinary tract  infection) 10/21/2020  . Hypocalcemia 10/21/2020  . HLD (hyperlipidemia) 10/21/2020  . Anasarca associated with disorder of kidney   . Chronic anemia   . Hyperglycemic hyperosmolar nonketotic coma ([place])   . Status epilepticus ([place]) 01/19/2020  . Acute pulmonary edema ([place]) 08/27/2019  . HTN (hypertension), malignant 08/26/2019  . HTN (hypertension) 01/29/2019  . Chronic diastolic heart failure ([place]) 08/14/2017  . Hypertensive emergency 07/05/2017  . 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 Acute on chronic renal failure ([place]) 06/14/2017  . Hyponatremia 06/14/2017  . Hypokalemia 06/14/2017  . Leg swelling 06/14/2017  . Cardiomyopathy ([place]) 04/08/2015  . Accelerated hypertension 04/08/2015  . Diabetes mellitus, type 2 ([place]) 04/08/2015   PCP:  Minda Ditto, MD Pharmacy:   [id number] [place], [address] [place] [address] Phone: [phone] Fax: [phone]     Social Determinants of Health (SDOH) Interventions    Readmission Risk Interventions Readmission Risk Prevention Plan 10/27/2020  Transportation Screening Complete  Medication Review Press photographer) Complete  PCP or Specialist appointment within 3-5 days of discharge Complete  HRI or [place] Complete  SW Recovery Care/Counseling Consult Complete  [place] Not Applicable  Some recent data might be hidden

## 2020-10-27 NOTE — Plan of Care (Signed)
  Problem: Education: Goal: Knowledge of General Education information will improve Description Including pain rating scale, medication(s)/side effects and non-pharmacologic comfort measures Outcome: Progressing   Problem: Health Behavior/Discharge Planning: Goal: Ability to manage health-related needs will improve Outcome: Progressing   Problem: Clinical Measurements: Goal: Ability to maintain clinical measurements within normal limits will improve Outcome: Progressing Goal: Will remain free from infection Outcome: Progressing Goal: Diagnostic test results will improve Outcome: Progressing Goal: Respiratory complications will improve Outcome: Progressing Goal: Cardiovascular complication will be avoided Outcome: Progressing   Problem: Activity: Goal: Risk for activity intolerance will decrease Outcome: Progressing   Problem: Nutrition: Goal: Adequate nutrition will be maintained Outcome: Progressing   Problem: Coping: Goal: Level of anxiety will decrease Outcome: Progressing   Problem: Elimination: Goal: Will not experience complications related to bowel motility Outcome: Progressing Goal: Will not experience complications related to urinary retention Outcome: Progressing   Problem: Pain Managment: Goal: General experience of comfort will improve Outcome: Progressing   Problem: Safety: Goal: Ability to remain free from injury will improve Outcome: Progressing   Problem: Skin Integrity: Goal: Risk for impaired skin integrity will decrease Outcome: Progressing   Problem: Education: Goal: Knowledge of disease and its progression will improve Outcome: Progressing Goal: Individualized Educational Video(s) Outcome: Progressing   Problem: Fluid Volume: Goal: Compliance with measures to maintain balanced fluid volume will improve Outcome: Progressing   Problem: Health Behavior/Discharge Planning: Goal: Ability to manage health-related needs will  improve Outcome: Progressing   Problem: Nutritional: Goal: Ability to make healthy dietary choices will improve Outcome: Progressing   Problem: Clinical Measurements: Goal: Complications related to the disease process, condition or treatment will be avoided or minimized Outcome: Progressing   Problem: Education: Goal: Ability to demonstrate management of disease process will improve Outcome: Progressing Goal: Ability to verbalize understanding of medication therapies will improve Outcome: Progressing Goal: Individualized Educational Video(s) Outcome: Progressing   Problem: Activity: Goal: Capacity to carry out activities will improve Outcome: Progressing   Problem: Cardiac: Goal: Ability to achieve and maintain adequate cardiopulmonary perfusion will improve Outcome: Progressing   

## 2020-10-27 NOTE — Care Management Important Message (Signed)
Important Message  Patient Details  Name: Kerly Rigsbee MRN: [id number] Date of Birth: 1976-07-03   Medicare Important Message Given:  Yes     Dannette Barbara 10/27/2020, 1:36 PM

## 2020-10-27 NOTE — Progress Notes (Addendum)
[facility]  ROUNDING NOTE   Subjective:   [name] is a 44 years old female with chronic kidney disease, hypertension, obesity, heart failure with preserved ejection fraction, presented to the emergency room on 10/20/2020 with worsening shortness of breath and bilateral lower extremity edema.  Patient found sitting up at the side of the bed, in no acute distress.  She still has bilateral lower extremity edema, even though it is getting slightly better.  She denies shortness of breath, nausea, and vomiting.  She is able to consume her breakfast with fair appetite, states appetite 'picking up'  Objective:  Vital signs in last 24 hours:  Temp:  [97.6 F (36.4 C)-98.4 F (36.9 C)] 98.2 F (36.8 C) (10/28 1155) Pulse Rate:  [60-66] 63 (10/28 1155) Resp:  [16-18] 17 (10/28 1155) BP: (140-163)/(67-82) 140/67 (10/28 1155) SpO2:  [94 %-100 %] 94 % (10/28 1155) Weight:  [122.3 kg] 122.3 kg (10/28 0504)  Weight change: 0.3 kg Filed Weights   10/25/20 0500 10/26/20 0157 10/27/20 0504  Weight: 123.1 kg 122 kg 122.3 kg    Intake/Output: I/O last 3 completed shifts: In: 480 [P.O.:480] Out: 1000 [Urine:1000]   Intake/Output this shift:  Total I/O In: 360 [P.O.:360] Out: -   Physical Exam: General:  In no acute distress  Head:  Oral mucous membranes moist  Eyes:  Sclera and conjunctiva clear  Lungs:   Lungs clear but diminished at the bases, normal and symmetrical respiratory effort  Heart:  Regular rate and rhythm  Abdomen:   Nondistended  Extremities:  1+ peripheral edema  Neurologic:  Oriented x3  Skin: No acute lesions or rashes  Access:  Rt IJ Permcath    Basic Metabolic Panel: Recent Labs  Lab 10/21/20 0458 10/21/20 0458 10/22/20 0036 10/22/20 1314 10/23/20 0455 10/23/20 0455 10/24/20 0556 10/24/20 0556 10/25/20 0522 10/26/20 0616 10/26/20 1119 10/27/20 0346  NA 136   < > 135  --  136  --  138  --  134* 137  --  139  K 3.3*   < > 3.5  --  3.4*   --  3.5  --  3.3* 3.3*  --  3.5  CL 103   < > 103  --  104  --  106  --  100 101  --  104  CO2 13*   < > 14*  --  15*  --  14*  --  17* 22  --  24  GLUCOSE 138*   < > 134*  --  93  --  82  --  115* 127*  --  106*  BUN 153*   < > 156*  --  155*  --  160*  --  121* 85*  --  51*  CREATININE 13.63*   < > 14.22*  --  14.19*  --  14.45*  --  11.93* 9.34*  --  6.65*  CALCIUM 6.9*   < > 6.6*   < > 6.3*   < > 6.6*   < > 6.7* 7.3*  --  7.4*  MG 2.6*  --  2.4  --   --   --   --   --   --   --   --   --   PHOS 9.7*   < >  --   --  10.9*   < > 11.3*  --  8.5* 6.2* 6.6* 5.4*   < > = values in this interval not displayed.  Liver Function Tests: Recent Labs  Lab 10/22/20 0816 10/22/20 0816 10/23/20 0455 10/24/20 0556 10/25/20 0522 10/26/20 0616 10/27/20 0346  AST 14*  --   --   --   --   --   --   ALT 32  --   --   --   --   --   --   ALKPHOS 116  --   --   --   --   --   --   BILITOT 0.5  --   --   --   --   --   --   PROT 6.2*  --   --   --   --   --   --   ALBUMIN 2.9*   < > 2.9* 2.9* 3.0* 2.7* 2.8*   < > = values in this interval not displayed.   No results for input(s): LIPASE, AMYLASE in the last 168 hours. No results for input(s): AMMONIA in the last 168 hours.  CBC: Recent Labs  Lab 10/23/20 0455 10/24/20 0557 10/25/20 0522 10/26/20 0616 10/27/20 0346  WBC 6.3 7.8 8.2 8.9 8.6  HGB 6.6* 8.0* 8.0* 7.8* 7.8*  HCT 22.1* 25.8* 25.9* 25.5* 25.4*  MCV 75.9* 75.2* 76.4* 76.6* 78.4*  PLT 259 282 268 258 261    Cardiac Enzymes: No results for input(s): CKTOTAL, CKMB, CKMBINDEX, TROPONINI in the last 168 hours.  BNP: Invalid input(s): POCBNP  CBG: Recent Labs  Lab 10/26/20 1140 10/26/20 1654 10/26/20 2131 10/27/20 0802 10/27/20 1156  GLUCAP 127* 83 124* 106* 194*    Microbiology: Results for orders placed or performed during the hospital encounter of 10/21/20  Respiratory Panel by RT PCR (Flu A&B, Covid) - Nasopharyngeal Swab     Status: None   Collection Time:  10/21/20  4:58 AM   Specimen: Nasopharyngeal Swab  Result Value Ref Range Status   SARS Coronavirus 2 by RT PCR NEGATIVE NEGATIVE Final    Comment: (NOTE) SARS-CoV-2 target nucleic acids are NOT DETECTED.  The SARS-CoV-2 RNA is generally detectable in upper respiratoy specimens during the acute phase of infection. The lowest concentration of SARS-CoV-2 viral copies this assay can detect is 131 copies/mL. A negative result does not preclude SARS-Cov-2 infection and should not be used as the sole basis for treatment or other patient management decisions. A negative result may occur with  improper specimen collection/handling, submission of specimen other than nasopharyngeal swab, presence of viral mutation(s) within the areas targeted by this assay, and inadequate number of viral copies (<131 copies/mL). A negative result must be combined with clinical observations, patient history, and epidemiological information. The expected result is Negative.  Fact Sheet for Patients:  PinkCheek.be  Fact Sheet for Healthcare Providers:  GravelBags.it  This test is no t yet approved or cleared by the [place] FDA and  has been authorized for detection and/or diagnosis of SARS-CoV-2 by FDA under an Emergency Use Authorization (EUA). This EUA will remain  in effect (meaning this test can be used) for the duration of the COVID-19 declaration under Section 564(b)(1) of the Act, 21 U.S.C. section 360bbb-3(b)(1), unless the authorization is terminated or revoked sooner.     Influenza A by PCR NEGATIVE NEGATIVE Final   Influenza B by PCR NEGATIVE NEGATIVE Final    Comment: (NOTE) The Xpert Xpress SARS-CoV-2/FLU/RSV assay is intended as an aid in  the diagnosis of influenza from Nasopharyngeal swab specimens and  should not be used as a sole basis for treatment.  Nasal washings and  aspirates are unacceptable for Xpert Xpress  SARS-CoV-2/FLU/RSV  testing.  Fact Sheet for Patients: PinkCheek.be  Fact Sheet for Healthcare Providers: GravelBags.it  This test is not yet approved or cleared by the [place] FDA and  has been authorized for detection and/or diagnosis of SARS-CoV-2 by  FDA under an Emergency Use Authorization (EUA). This EUA will remain  in effect (meaning this test can be used) for the duration of the  Covid-19 declaration under Section 564(b)(1) of the Act, 21  U.S.C. section 360bbb-3(b)(1), unless the authorization is  terminated or revoked. Performed at [facility], [address] [place], [place] [address]   Urine Culture     Status: Abnormal   Collection Time: 10/21/20  4:58 AM   Specimen: Urine, Random  Result Value Ref Range Status   Specimen Description   Final    URINE, RANDOM Performed at [facility], [address] [place], [place] [address]    Special Requests   Final    Normal Performed at [facility], [address] [place], [place] [address]    Culture >=100,000 COLONIES/mL ESCHERICHIA COLI (A)  Final   Report Status 10/23/2020 FINAL  Final   Organism ID, Bacteria ESCHERICHIA COLI (A)  Final      Susceptibility   Escherichia coli - MIC*    AMPICILLIN >=32 RESISTANT Resistant     CEFAZOLIN <=4 SENSITIVE Sensitive     CEFTRIAXONE <=0.25 SENSITIVE Sensitive     CIPROFLOXACIN <=0.25 SENSITIVE Sensitive     GENTAMICIN <=1 SENSITIVE Sensitive     IMIPENEM <=0.25 SENSITIVE Sensitive     NITROFURANTOIN <=16 SENSITIVE Sensitive     TRIMETH/SULFA <=20 SENSITIVE Sensitive     AMPICILLIN/SULBACTAM 16 INTERMEDIATE Intermediate     PIP/TAZO <=4 SENSITIVE Sensitive     * >=100,000 COLONIES/mL ESCHERICHIA COLI  CULTURE, BLOOD (ROUTINE X 2) w Reflex to ID Panel     Status: None   Collection Time: 10/21/20 11:36 AM   Specimen: BLOOD  Result Value Ref Range Status   Specimen Description  BLOOD BLOOD LEFT HAND  Final   Special Requests   Final    BOTTLES DRAWN AEROBIC AND ANAEROBIC Blood Culture adequate volume   Culture   Final    NO GROWTH 5 DAYS Performed at [facility], [address] [place], [place] [address]    Report Status 10/26/2020 FINAL  Final  CULTURE, BLOOD (ROUTINE X 2) w Reflex to ID Panel     Status: None   Collection Time: 10/21/20  4:10 PM   Specimen: BLOOD  Result Value Ref Range Status   Specimen Description BLOOD RIGHT ANTECUBITAL  Final   Special Requests   Final    BOTTLES DRAWN AEROBIC AND ANAEROBIC Blood Culture adequate volume   Culture   Final    NO GROWTH 5 DAYS Performed at [facility], [address] [place], [place] [address]    Report Status 10/26/2020 FINAL  Final    Coagulation Studies: No results for input(s): LABPROT, INR in the last 72 hours.  Urinalysis: No results for input(s): COLORURINE, LABSPEC, PHURINE, GLUCOSEU, HGBUR, BILIRUBINUR, KETONESUR, PROTEINUR, UROBILINOGEN, NITRITE, LEUKOCYTESUR in the last 72 hours.  Invalid input(s): APPERANCEUR    Imaging: No results found.   Medications:   . sodium chloride    . sodium chloride    . [START ON 10/29/2020] ferumoxytol     . amLODipine  10 mg Oral Daily  . aspirin  81 mg Oral Daily  . calcitRIOL  0.25 mcg Oral Once per day on Mon Wed Sat  . calcium carbonate  1,250 mg Oral BID WC  . carvedilol  25 mg Oral BID WC  . Chlorhexidine Gluconate Cloth  6 each Topical Q0600  . cloNIDine  0.2 mg Oral TID  . epoetin (EPOGEN/PROCRIT) injection  4,000 Units Subcutaneous Q7 days  . heparin  5,000 Units Subcutaneous Q8H  . hydrALAZINE  50 mg Oral TID  . insulin aspart  0-5 Units Subcutaneous QHS  . insulin aspart  0-9 Units Subcutaneous TID WC  . insulin glargine  13 Units Subcutaneous Daily  . isosorbide mononitrate  30 mg Oral Daily  . levETIRAcetam  500 mg Oral BID  . mouth rinse  15 mL Mouth Rinse BID  . melatonin  5 mg Oral QHS  .  pneumococcal 23 valent vaccine  0.5 mL Intramuscular Tomorrow-1000  . pravastatin  20 mg Oral q1800  . sodium bicarbonate  650 mg Oral TID  . Vitamin D (Ergocalciferol)  50,000 Units Oral Q7 days   sodium chloride, sodium chloride, acetaminophen, albuterol, alteplase, calcium carbonate, dextromethorphan-guaiFENesin, heparin, hydrALAZINE, lidocaine (PF), lidocaine-prilocaine, ondansetron (ZOFRAN) IV, ondansetron (ZOFRAN) IV, pentafluoroprop-tetrafluoroeth  Assessment/ Plan  [name] is a 44 y.o.  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  Baseline creatinine on 01/23/2020 4.90 GFR 12 Lab Results  Component Value Date   CREATININE 6.65 (H) 10/27/2020   CREATININE 9.34 (H) 10/26/2020   CREATININE 11.93 (H) 10/25/2020   Patient tolerated her dialysis treatments well Volume and electrolyte status acceptable We are not planning to dialyze her today Planning dialysis tomorrow  # Hypertensive Crisis # Heart failure with preserved ejection fraction Patient denies any worsening shortness of breath She is on 2 L of supplemental O2 via nasal cannula Hypertension controlled with current antihypertensive regimen  #Secondary hyperparathyroidism Calcium 7.4 Phosphorus 5.4 No acute indication for binders We will continue monitoring  #Anemia of chronic kidney disease Hgb 7.8 Will order Epogen, MWF with dialysis  #Diabetes Type 2 with CKD Patient is on long-acting and short-acting insulins Blood glucose level stay within acceptable range     LOS: 6 Princy Raju 10/28/20213:03 PM   I saw and evaluated the patient and discussed the care with Crosby Oyster, DNP.  I agree with the findings and plan as documented in the note.  Patient has tolerated initiation of dialysis quite  well.  Doing much better as compared to admission.  We will plan for ongoing dialysis treatments as an outpatient at [facility] dialysis.  Thanks for allowing [place] to participate in her care.  Kiyomi Pallo Holley Raring , [facility] 10/28/20218:45 PM

## 2020-10-27 NOTE — Progress Notes (Signed)
Mobility Specialist - Progress Note   10/27/20 1500  Mobility  Activity Stood at bedside  Range of Motion/Exercises Right leg;Left leg (ankle pumps, quad sets, slr, STS, hip add/abd)  Level of Assistance Standby assist, set-up cues, supervision of patient - no hands on  Assistive Device None  Distance Ambulated (ft) 0 ft  Mobility Response Tolerated well  Mobility performed by Mobility specialist  $Mobility charge 1 Mobility    Pre-mobility: 67 HR, 96% SpO2 During mobility: 66 HR, 96%SpO2 Post-mobility: 73 HR, 98% SpO2   Pt was lying in bed upon arrival utilizing 1L [place] O2. Pt agreed to session. Pt denied any current pain, nausea, or fatigue but did state that she usually experiences pain in L LE. Pt was able to perform exercises: ankle pumps, quad sets, straight leg raises, hip abd/add, and stood at bedside with SBA. Pt c/o slight pressure in abdomen upon standing, but no pain. Overall, pt tolerated session well. Pt was left EOB with all needs in reach.    Kathee Delton Mobility Specialist 10/27/20, 3:25 PM

## 2020-10-27 NOTE — Discharge Summary (Addendum)
Physician Discharge Summary  [name] KNL:[id number] DOB: [birth year] DOA: 10/21/2020  PCP: Minda Ditto, MD  Admit date: 10/21/2020 Discharge date: 10/27/2020  Discharge disposition: Home   Recommendations for Outpatient Follow-Up:   Follow-up with PCP in 1 week Follow-up with nephrologist for outpatient hemodialysis on Mondays, Wednesdays and Fridays   Discharge Diagnosis:   Principal Problem:   ARF (acute renal failure) ([place]) Active Problems:   Hypokalemia   Hypertensive emergency   Chronic diastolic heart failure (HCC)   HTN (hypertension)   Acute pulmonary edema (HCC)   Type II diabetes mellitus with renal manifestations (HCC)   Chest pain   Elevated troponin   UTI (urinary tract infection)   Hypocalcemia   HLD (hyperlipidemia)   ESRD (end stage renal disease) ([place])    Discharge Condition: Stable.  Diet recommendation:  Diet Order            Diet renal 60/70-02-01-1199           Diet Carb Modified           Diet renal/carb modified with fluid restriction Diet-HS Snack? Nothing; Fluid restriction: 1200 mL Fluid; Room service appropriate? Yes; Fluid consistency: Thin  Diet effective now                   Code Status: Full Code     Hospital Course:   [name] a 44 y.o.femalewith medical history significant ofhypertension, hyperlipidemia, diabetes mellitus, CKD stage V,chronic diastolic CHF, morbid obesity. She presented to the hospital with worsening lower extremity edema, abdominal distention, shortness of breath, chest pain and increased urinary frequency.  She said she had not been taking her medications for more than a month.  She was admitted to the hospital for AKI on CKD stage V, hypertensive emergency and acute on chronic diastolic CHF.  She was treated with IV Lasix.  Elevated troponin was likely due to demand ischemia.  She was seen in consultation by the nephrologist and vascular surgeon.  Hemodialysis catheter via  right IJ vein was placed on 10/24/2020 and hemodialysis was initiated on 10/24/2020.  Her symptoms have improved significantly.  Blood pressure has improved as well.  She also has secondary hyperparathyroidism, hypercalcemia, hyperphosphatemia and vitamin D deficiency and she was started on calcium carbonate supplement, Rocaltrol and high-dose vitamin D supplement.  She was given Epogen for anemia of chronic kidney disease and she also received IV iron infusion.  She is a known diabetic and she takes Lantus and aspart at home.  She had episodes of hypoglycemia in the hospital so dose of Lantus has been decreased and aspart has been discontinued.  She completed 5-day course of antibiotics for suspected UTI.  Her condition has improved and she is deemed stable for discharge to home today.  Discharge plan was discussed with the patient and her husband at the bedside.  Of note, Keppra was listed on her admission med rec.  However, patient said she has never taken Keppra and does not have any history of seizures.  This was confirmed by her husband at the bedside.  Discharge plan was also discussed with nephrologist, Dr. Holley Raring and from his standpoint patient is okay for discharge.    Medical Consultants:    Nephrologist  Vascular surgeon   Discharge Exam:    Vitals:   10/26/20 2011 10/27/20 0504 10/27/20 0800 10/27/20 1155  BP: (!) 150/75 (!) 163/82 (!) 150/81 140/67  Pulse: 65 66 63 63  Resp: 18  18 17  Temp: 98 F (36.7 C) 98 F (36.7 C) 98.4 F (36.9 C) 98.2 F (36.8 C)  TempSrc: Oral Oral Oral Oral  SpO2: 97% 98% 100% 94%  Weight:  122.3 kg    Height:         GEN: NAD SKIN: No rash EYES: EOMI ENT: MMM CV: RRR PULM: CTA B ABD: soft, obese/distended, NT, +BS CNS: AAO x 3, non focal EXT: Improving bilateral lower extremity edema in the thighs and legs.  No erythema or tenderness  The results of significant diagnostics from this hospitalization (including imaging,  microbiology, ancillary and laboratory) are listed below for reference.     Procedures and Diagnostic Studies:   US RENAL  Result Date: 10/21/2020 CLINICAL DATA:  Acute renal failure EXAM: RENAL / URINARY TRACT ULTRASOUND COMPLETE COMPARISON:  Ultrasound renal 08/26/2019 FINDINGS: Right Kidney: Renal measurements: 9.1 x 4.2 x 4.3 cm = volume: 85 mL. Increased echogenicity of the renal cortex diffusely. No mass or obstruction Left Kidney: Renal measurements: 8.1 x 3.8 x 3.9 cm = volume: 62 mL. Diffusely increased echogenicity of the renal cortex. No mass or obstruction. Bladder: Not visualized.  Patient has a catheter. Other: Ascites around the liver. IMPRESSION: Small kidneys with diffusely increased echogenicity of the renal cortex. No obstruction. Electronically Signed   By: Franchot Gallo M.D.   On: 10/21/2020 16:21     Labs:   Basic Metabolic Panel: Recent Labs  Lab 10/21/20 0458 10/21/20 0458 10/22/20 0036 10/22/20 1314 10/23/20 0455 10/23/20 0455 10/24/20 0556 10/24/20 0556 10/25/20 0522 10/25/20 0522 10/26/20 0616 10/26/20 1119 10/27/20 0346  NA 136   < > 135  --  136  --  138  --  134*  --  137  --  139  K 3.3*   < > 3.5  --  3.4*   < > 3.5   < > 3.3*   < > 3.3*  --  3.5  CL 103   < > 103  --  104  --  106  --  100  --  101  --  104  CO2 13*   < > 14*  --  15*  --  14*  --  17*  --  22  --  24  GLUCOSE 138*   < > 134*  --  93  --  82  --  115*  --  127*  --  106*  BUN 153*   < > 156*  --  155*  --  160*  --  121*  --  85*  --  51*  CREATININE 13.63*   < > 14.22*  --  14.19*  --  14.45*  --  11.93*  --  9.34*  --  6.65*  CALCIUM 6.9*   < > 6.6*   < > 6.3*  --  6.6*  --  6.7*  --  7.3*  --  7.4*  MG 2.6*  --  2.4  --   --   --   --   --   --   --   --   --   --   PHOS 9.7*   < >  --   --  10.9*   < > 11.3*  --  8.5*  --  6.2* 6.6* 5.4*   < > = values in this interval not displayed.   GFR Estimated Creatinine Clearance: 14 mL/min (A) (by C-G formula based on SCr of  6.65 mg/dL (H)). Liver Function  Tests: Recent Labs  Lab 10/22/20 0816 10/22/20 0816 10/23/20 0455 10/24/20 0556 10/25/20 0522 10/26/20 0616 10/27/20 0346  AST 14*  --   --   --   --   --   --   ALT 32  --   --   --   --   --   --   ALKPHOS 116  --   --   --   --   --   --   BILITOT 0.5  --   --   --   --   --   --   PROT 6.2*  --   --   --   --   --   --   ALBUMIN 2.9*   < > 2.9* 2.9* 3.0* 2.7* 2.8*   < > = values in this interval not displayed.   No results for input(s): LIPASE, AMYLASE in the last 168 hours. No results for input(s): AMMONIA in the last 168 hours. Coagulation profile Recent Labs  Lab 10/21/20 1145 10/24/20 0932  INR 1.3* 1.3*    CBC: Recent Labs  Lab 10/23/20 0455 10/24/20 0557 10/25/20 0522 10/26/20 0616 10/27/20 0346  WBC 6.3 7.8 8.2 8.9 8.6  HGB 6.6* 8.0* 8.0* 7.8* 7.8*  HCT 22.1* 25.8* 25.9* 25.5* 25.4*  MCV 75.9* 75.2* 76.4* 76.6* 78.4*  PLT 259 282 268 258 261   Cardiac Enzymes: No results for input(s): CKTOTAL, CKMB, CKMBINDEX, TROPONINI in the last 168 hours. BNP: Invalid input(s): POCBNP CBG: Recent Labs  Lab 10/26/20 1140 10/26/20 1654 10/26/20 2131 10/27/20 0802 10/27/20 1156  GLUCAP 127* 83 124* 106* 194*   D-Dimer No results for input(s): DDIMER in the last 72 hours. Hgb A1c No results for input(s): HGBA1C in the last 72 hours. Lipid Profile No results for input(s): CHOL, HDL, LDLCALC, TRIG, CHOLHDL, LDLDIRECT in the last 72 hours. Thyroid function studies No results for input(s): TSH, T4TOTAL, T3FREE, THYROIDAB in the last 72 hours.  Invalid input(s): FREET3 Anemia work up No results for input(s): VITAMINB12, FOLATE, FERRITIN, TIBC, IRON, RETICCTPCT in the last 72 hours. Microbiology Recent Results (from the past 240 hour(s))  Respiratory Panel by RT PCR (Flu A&B, Covid) - Nasopharyngeal Swab     Status: None   Collection Time: 10/21/20  4:58 AM   Specimen: Nasopharyngeal Swab  Result Value Ref Range Status    SARS Coronavirus 2 by RT PCR NEGATIVE NEGATIVE Final    Comment: (NOTE) SARS-CoV-2 target nucleic acids are NOT DETECTED.  The SARS-CoV-2 RNA is generally detectable in upper respiratoy specimens during the acute phase of infection. The lowest concentration of SARS-CoV-2 viral copies this assay can detect is 131 copies/mL. A negative result does not preclude SARS-Cov-2 infection and should not be used as the sole basis for treatment or other patient management decisions. A negative result may occur with  improper specimen collection/handling, submission of specimen other than nasopharyngeal swab, presence of viral mutation(s) within the areas targeted by this assay, and inadequate number of viral copies (<131 copies/mL). A negative result must be combined with clinical observations, patient history, and epidemiological information. The expected result is Negative.  Fact Sheet for Patients:  PinkCheek.be  Fact Sheet for Healthcare Providers:  GravelBags.it  This test is no t yet approved or cleared by the [place] FDA and  has been authorized for detection and/or diagnosis of SARS-CoV-2 by FDA under an Emergency Use Authorization (EUA). This EUA will remain  in effect (meaning this test can be  used) for the duration of the COVID-19 declaration under Section 564(b)(1) of the Act, 21 U.S.C. section 360bbb-3(b)(1), unless the authorization is terminated or revoked sooner.     Influenza A by PCR NEGATIVE NEGATIVE Final   Influenza B by PCR NEGATIVE NEGATIVE Final    Comment: (NOTE) The Xpert Xpress SARS-CoV-2/FLU/RSV assay is intended as an aid in  the diagnosis of influenza from Nasopharyngeal swab specimens and  should not be used as a sole basis for treatment. Nasal washings and  aspirates are unacceptable for Xpert Xpress SARS-CoV-2/FLU/RSV  testing.  Fact Sheet for  Patients: PinkCheek.be  Fact Sheet for Healthcare Providers: GravelBags.it  This test is not yet approved or cleared by the [place] FDA and  has been authorized for detection and/or diagnosis of SARS-CoV-2 by  FDA under an Emergency Use Authorization (EUA). This EUA will remain  in effect (meaning this test can be used) for the duration of the  Covid-19 declaration under Section 564(b)(1) of the Act, 21  U.S.C. section 360bbb-3(b)(1), unless the authorization is  terminated or revoked. Performed at [facility], [address] [place], [place] [address]   Urine Culture     Status: Abnormal   Collection Time: 10/21/20  4:58 AM   Specimen: Urine, Random  Result Value Ref Range Status   Specimen Description   Final    URINE, RANDOM Performed at [facility], [address] [place], [place] [address]    Special Requests   Final    Normal Performed at [facility], [address] [place], [place] [address]    Culture >=100,000 COLONIES/mL ESCHERICHIA COLI (A)  Final   Report Status 10/23/2020 FINAL  Final   Organism ID, Bacteria ESCHERICHIA COLI (A)  Final      Susceptibility   Escherichia coli - MIC*    AMPICILLIN >=32 RESISTANT Resistant     CEFAZOLIN <=4 SENSITIVE Sensitive     CEFTRIAXONE <=0.25 SENSITIVE Sensitive     CIPROFLOXACIN <=0.25 SENSITIVE Sensitive     GENTAMICIN <=1 SENSITIVE Sensitive     IMIPENEM <=0.25 SENSITIVE Sensitive     NITROFURANTOIN <=16 SENSITIVE Sensitive     TRIMETH/SULFA <=20 SENSITIVE Sensitive     AMPICILLIN/SULBACTAM 16 INTERMEDIATE Intermediate     PIP/TAZO <=4 SENSITIVE Sensitive     * >=100,000 COLONIES/mL ESCHERICHIA COLI  CULTURE, BLOOD (ROUTINE X 2) w Reflex to ID Panel     Status: None   Collection Time: 10/21/20 11:36 AM   Specimen: BLOOD  Result Value Ref Range Status   Specimen Description BLOOD BLOOD LEFT HAND  Final   Special  Requests   Final    BOTTLES DRAWN AEROBIC AND ANAEROBIC Blood Culture adequate volume   Culture   Final    NO GROWTH 5 DAYS Performed at [facility], [address] [place], [place] [address]    Report Status 10/26/2020 FINAL  Final  CULTURE, BLOOD (ROUTINE X 2) w Reflex to ID Panel     Status: None   Collection Time: 10/21/20  4:10 PM   Specimen: BLOOD  Result Value Ref Range Status   Specimen Description BLOOD RIGHT ANTECUBITAL  Final   Special Requests   Final    BOTTLES DRAWN AEROBIC AND ANAEROBIC Blood Culture adequate volume   Culture   Final    NO GROWTH 5 DAYS Performed at [facility], [address] [place], [place] [address]    Report Status 10/26/2020 FINAL  Final  Discharge Instructions:   Discharge Instructions    Diet Carb Modified   Complete by: As directed    Diet renal 60/70-02-01-1199   Complete by: As directed    Discharge instructions   Complete by: As directed    Go to the outpatient hemodialysis center on Mondays, Wednesdays and Fridays for dialysis.   Increase activity slowly   Complete by: As directed    No wound care   Complete by: As directed      Allergies as of 10/27/2020   No Known Allergies     Medication List    STOP taking these medications   amLODipine 10 MG tablet Commonly known as: NORVASC   aspirin 81 MG chewable tablet   carvedilol 25 MG tablet Commonly known as: COREG   cloNIDine 0.2 MG tablet Commonly known as: CATAPRES   insulin aspart 100 UNIT/ML injection Commonly known as: novoLOG   levETIRAcetam 500 MG tablet Commonly known as: KEPPRA   pravastatin 20 MG tablet Commonly known as: PRAVACHOL   sodium bicarbonate 650 MG tablet     TAKE these medications   calcitRIOL 0.25 MCG capsule Commonly known as: ROCALTROL Take 0.25 mcg by mouth See admin instructions. MON WED SAT   calcium carbonate 1250 (500 Ca) MG tablet Commonly known as: OS-CAL - dosed in mg of elemental calcium Take  1 tablet (1,250 mg total) by mouth 2 (two) times daily with a meal.   hydrALAZINE 50 MG tablet Commonly known as: APRESOLINE Take 1 tablet (50 mg total) by mouth 3 (three) times daily.   insulin glargine 100 UNIT/ML injection Commonly known as: LANTUS Inject 0.15 mLs (15 Units total) into the skin daily. Start taking on: October 28, 2020 What changed:   how much to take  when to take this   isosorbide mononitrate 30 MG 24 hr tablet Commonly known as: IMDUR Take 1 tablet (30 mg total) by mouth daily.   Vitamin D (Ergocalciferol) 1.25 MG (50000 UNIT) Caps capsule Commonly known as: DRISDOL Take 1 capsule (50,000 Units total) by mouth every 7 (seven) days. Start taking on: October 30, 2020       Follow-up Information    [facility] Follow up on 11/03/2020.   Specialty: Cardiology Why: at 10:30am. Enter through the [place] entrance Contact information: [address] [place] [phone]               Time coordinating discharge: 35 minutes  Signed:  Jennye Boroughs  [facility] 10/27/2020, 2:47 PM   Pager on [email or web address]. If 7PM-7AM, please contact night-coverage at [email or web address]

## 2020-10-28 ENCOUNTER — Encounter: Payer: Self-pay | Admitting: Student

## 2020-10-31 ENCOUNTER — Other Ambulatory Visit: Payer: Self-pay

## 2020-10-31 ENCOUNTER — Emergency Department: Payer: Medicare Other

## 2020-10-31 ENCOUNTER — Inpatient Hospital Stay
Admission: EM | Admit: 2020-10-31 | Discharge: 2020-11-06 | DRG: 264 | Disposition: A | Discharge status: Home Health | Payer: Medicare Other | Attending: Internal Medicine | Admitting: Internal Medicine

## 2020-10-31 DIAGNOSIS — D631 Anemia in chronic kidney disease: Secondary | ICD-10-CM | POA: Diagnosis present

## 2020-10-31 DIAGNOSIS — J9811 Atelectasis: Secondary | ICD-10-CM | POA: Diagnosis not present

## 2020-10-31 DIAGNOSIS — E8770 Fluid overload, unspecified: Secondary | ICD-10-CM

## 2020-10-31 DIAGNOSIS — Z9114 Patient's other noncompliance with medication regimen: Secondary | ICD-10-CM | POA: Diagnosis not present

## 2020-10-31 DIAGNOSIS — Z23 Encounter for immunization: Secondary | ICD-10-CM

## 2020-10-31 DIAGNOSIS — J9601 Acute respiratory failure with hypoxia: Secondary | ICD-10-CM | POA: Diagnosis present

## 2020-10-31 DIAGNOSIS — Z79899 Other long term (current) drug therapy: Secondary | ICD-10-CM | POA: Diagnosis not present

## 2020-10-31 DIAGNOSIS — I499 Cardiac arrhythmia, unspecified: Secondary | ICD-10-CM | POA: Diagnosis not present

## 2020-10-31 DIAGNOSIS — E1122 Type 2 diabetes mellitus with diabetic chronic kidney disease: Secondary | ICD-10-CM | POA: Diagnosis not present

## 2020-10-31 DIAGNOSIS — N184 Chronic kidney disease, stage 4 (severe): Secondary | ICD-10-CM | POA: Diagnosis not present

## 2020-10-31 DIAGNOSIS — I5033 Acute on chronic diastolic (congestive) heart failure: Secondary | ICD-10-CM

## 2020-10-31 DIAGNOSIS — E785 Hyperlipidemia, unspecified: Secondary | ICD-10-CM | POA: Diagnosis present

## 2020-10-31 DIAGNOSIS — Z992 Dependence on renal dialysis: Secondary | ICD-10-CM

## 2020-10-31 DIAGNOSIS — Z6841 Body Mass Index (BMI) 40.0 and over, adult: Secondary | ICD-10-CM | POA: Diagnosis not present

## 2020-10-31 DIAGNOSIS — I132 Hypertensive heart and chronic kidney disease with heart failure and with stage 5 chronic kidney disease, or end stage renal disease: Principal | ICD-10-CM | POA: Diagnosis present

## 2020-10-31 DIAGNOSIS — Z794 Long term (current) use of insulin: Secondary | ICD-10-CM

## 2020-10-31 DIAGNOSIS — N2581 Secondary hyperparathyroidism of renal origin: Secondary | ICD-10-CM | POA: Diagnosis present

## 2020-10-31 DIAGNOSIS — I10 Essential (primary) hypertension: Secondary | ICD-10-CM | POA: Diagnosis not present

## 2020-10-31 DIAGNOSIS — I160 Hypertensive urgency: Secondary | ICD-10-CM

## 2020-10-31 DIAGNOSIS — R778 Other specified abnormalities of plasma proteins: Secondary | ICD-10-CM

## 2020-10-31 DIAGNOSIS — Z8249 Family history of ischemic heart disease and other diseases of the circulatory system: Secondary | ICD-10-CM

## 2020-10-31 DIAGNOSIS — I161 Hypertensive emergency: Secondary | ICD-10-CM | POA: Diagnosis present

## 2020-10-31 DIAGNOSIS — N185 Chronic kidney disease, stage 5: Secondary | ICD-10-CM | POA: Diagnosis not present

## 2020-10-31 DIAGNOSIS — I5032 Chronic diastolic (congestive) heart failure: Secondary | ICD-10-CM | POA: Diagnosis present

## 2020-10-31 DIAGNOSIS — D509 Iron deficiency anemia, unspecified: Secondary | ICD-10-CM | POA: Diagnosis present

## 2020-10-31 DIAGNOSIS — E119 Type 2 diabetes mellitus without complications: Secondary | ICD-10-CM

## 2020-10-31 DIAGNOSIS — Z20822 Contact with and (suspected) exposure to covid-19: Secondary | ICD-10-CM | POA: Diagnosis present

## 2020-10-31 DIAGNOSIS — D649 Anemia, unspecified: Secondary | ICD-10-CM | POA: Diagnosis present

## 2020-10-31 DIAGNOSIS — R0902 Hypoxemia: Secondary | ICD-10-CM | POA: Diagnosis not present

## 2020-10-31 DIAGNOSIS — N186 End stage renal disease: Secondary | ICD-10-CM

## 2020-10-31 DIAGNOSIS — R7989 Other specified abnormal findings of blood chemistry: Secondary | ICD-10-CM

## 2020-10-31 DIAGNOSIS — M79602 Pain in left arm: Secondary | ICD-10-CM

## 2020-10-31 DIAGNOSIS — N179 Acute kidney failure, unspecified: Secondary | ICD-10-CM | POA: Diagnosis present

## 2020-10-31 DIAGNOSIS — R609 Edema, unspecified: Secondary | ICD-10-CM | POA: Diagnosis not present

## 2020-10-31 DIAGNOSIS — R0602 Shortness of breath: Secondary | ICD-10-CM | POA: Diagnosis not present

## 2020-10-31 DIAGNOSIS — I5030 Unspecified diastolic (congestive) heart failure: Secondary | ICD-10-CM | POA: Insufficient documentation

## 2020-10-31 HISTORY — DX: Type 2 diabetes mellitus without complications: E11.9

## 2020-10-31 LAB — COMPREHENSIVE METABOLIC PANEL
ALT: 29 U/L (ref 0–44)
AST: 26 U/L (ref 15–41)
Albumin: 3.8 g/dL (ref 3.5–5.0)
Alkaline Phosphatase: 142 U/L — ABNORMAL HIGH (ref 38–126)
Anion gap: 14 (ref 5–15)
BUN: 49 mg/dL — ABNORMAL HIGH (ref 6–20)
CO2: 24 mmol/L (ref 22–32)
Calcium: 9 mg/dL (ref 8.9–10.3)
Chloride: 105 mmol/L (ref 98–111)
Creatinine, Ser: 7.68 mg/dL — ABNORMAL HIGH (ref 0.44–1.00)
GFR, Estimated: 6 mL/min — ABNORMAL LOW (ref >60)
Glucose, Bld: 126 mg/dL — ABNORMAL HIGH (ref 70–99)
Potassium: 3.5 mmol/L (ref 3.5–5.1)
Sodium: 143 mmol/L (ref 135–145)
Total Bilirubin: 0.9 mg/dL (ref 0.3–1.2)
Total Protein: 7.8 g/dL (ref 6.5–8.1)

## 2020-10-31 LAB — TROPONIN I (HIGH SENSITIVITY)
Troponin I (High Sensitivity): 59 ng/L — ABNORMAL HIGH (ref <18)
Troponin I (High Sensitivity): 62 ng/L — ABNORMAL HIGH (ref <18)

## 2020-10-31 LAB — RESPIRATORY PANEL BY RT PCR (FLU A&B, COVID)
Influenza A by PCR: NEGATIVE
Influenza B by PCR: NEGATIVE
SARS Coronavirus 2 by RT PCR: NEGATIVE

## 2020-10-31 LAB — BRAIN NATRIURETIC PEPTIDE: B Natriuretic Peptide: 3034.2 pg/mL — ABNORMAL HIGH (ref 0.0–100.0)

## 2020-10-31 LAB — CBC WITH DIFFERENTIAL/PLATELET
Abs Immature Granulocytes: 0.04 10*3/uL (ref 0.00–0.07)
Basophils Absolute: 0.1 10*3/uL (ref 0.0–0.1)
Basophils Relative: 0 %
Eosinophils Absolute: 0.2 10*3/uL (ref 0.0–0.5)
Eosinophils Relative: 2 %
HCT: 31.2 % — ABNORMAL LOW (ref 36.0–46.0)
Hemoglobin: 9.2 g/dL — ABNORMAL LOW (ref 12.0–15.0)
Immature Granulocytes: 0 %
Lymphocytes Relative: 7 %
Lymphs Abs: 0.8 10*3/uL (ref 0.7–4.0)
MCH: 23.8 pg — ABNORMAL LOW (ref 26.0–34.0)
MCHC: 29.5 g/dL — ABNORMAL LOW (ref 30.0–36.0)
MCV: 80.6 fL (ref 80.0–100.0)
Monocytes Absolute: 0.5 10*3/uL (ref 0.1–1.0)
Monocytes Relative: 4 %
Neutro Abs: 9.8 10*3/uL — ABNORMAL HIGH (ref 1.7–7.7)
Neutrophils Relative %: 87 %
Platelets: 266 10*3/uL (ref 150–400)
RBC: 3.87 MIL/uL (ref 3.87–5.11)
RDW: 18.6 % — ABNORMAL HIGH (ref 11.5–15.5)
WBC: 11.3 10*3/uL — ABNORMAL HIGH (ref 4.0–10.5)
nRBC: 0 % (ref 0.0–0.2)

## 2020-10-31 LAB — CBC
HCT: 30.1 % — ABNORMAL LOW (ref 36.0–46.0)
Hemoglobin: 8.8 g/dL — ABNORMAL LOW (ref 12.0–15.0)
MCH: 23.5 pg — ABNORMAL LOW (ref 26.0–34.0)
MCHC: 29.2 g/dL — ABNORMAL LOW (ref 30.0–36.0)
MCV: 80.5 fL (ref 80.0–100.0)
Platelets: 228 10*3/uL (ref 150–400)
RBC: 3.74 MIL/uL — ABNORMAL LOW (ref 3.87–5.11)
RDW: 18.2 % — ABNORMAL HIGH (ref 11.5–15.5)
WBC: 9.1 10*3/uL (ref 4.0–10.5)
nRBC: 0 % (ref 0.0–0.2)

## 2020-10-31 MED ORDER — ZOLPIDEM TARTRATE 5 MG PO TABS
5.0000 mg | ORAL_TABLET | Freq: Every evening | ORAL | Status: DC | PRN
  Administered 2020-11-01: 5 mg via ORAL
  Filled 2020-10-31: qty 1

## 2020-10-31 MED ORDER — CALCIUM CARBONATE 1250 (500 CA) MG PO TABS
1250.0000 mg | ORAL_TABLET | Freq: Two times a day (BID) | ORAL | Status: DC
  Administered 2020-11-01 – 2020-11-06 (×6): 1250 mg via ORAL
  Filled 2020-10-31 (×14): qty 1

## 2020-10-31 MED ORDER — SORBITOL 70 % SOLN
30.0000 mL | Status: DC | PRN
  Filled 2020-10-31: qty 30

## 2020-10-31 MED ORDER — PENTAFLUOROPROP-TETRAFLUOROETH EX AERO
1.0000 "application " | INHALATION_SPRAY | CUTANEOUS | Status: DC | PRN
  Filled 2020-10-31: qty 30

## 2020-10-31 MED ORDER — DOCUSATE SODIUM 283 MG RE ENEM
1.0000 | ENEMA | RECTAL | Status: DC | PRN
  Filled 2020-10-31: qty 1

## 2020-10-31 MED ORDER — HEPARIN SODIUM (PORCINE) 5000 UNIT/ML IJ SOLN
5000.0000 [IU] | Freq: Three times a day (TID) | INTRAMUSCULAR | Status: DC
  Administered 2020-10-31 – 2020-11-05 (×11): 5000 [IU] via SUBCUTANEOUS
  Filled 2020-10-31 (×12): qty 1

## 2020-10-31 MED ORDER — ISOSORBIDE MONONITRATE ER 60 MG PO TB24
30.0000 mg | ORAL_TABLET | Freq: Every day | ORAL | Status: DC
  Filled 2020-10-31 (×2): qty 1

## 2020-10-31 MED ORDER — LIDOCAINE-PRILOCAINE 2.5-2.5 % EX CREA
1.0000 "application " | TOPICAL_CREAM | CUTANEOUS | Status: DC | PRN
  Filled 2020-10-31: qty 5

## 2020-10-31 MED ORDER — CALCIUM CARBONATE ANTACID 500 MG PO CHEW: 500.0000 mg | CHEWABLE_TABLET | Freq: Four times a day (QID) | ORAL | Status: DC | PRN

## 2020-10-31 MED ORDER — PANTOPRAZOLE SODIUM 40 MG IV SOLR
40.0000 mg | Freq: Two times a day (BID) | INTRAVENOUS | Status: DC
  Administered 2020-10-31 – 2020-11-01 (×2): 40 mg via INTRAVENOUS
  Filled 2020-10-31 (×2): qty 40

## 2020-10-31 MED ORDER — ACETAMINOPHEN 500 MG PO TABS
1000.0000 mg | ORAL_TABLET | Freq: Once | ORAL | Status: AC
  Administered 2020-10-31: 1000 mg via ORAL
  Filled 2020-10-31: qty 2

## 2020-10-31 MED ORDER — ACETAMINOPHEN 325 MG PO TABS
650.0000 mg | ORAL_TABLET | Freq: Four times a day (QID) | ORAL | Status: DC | PRN
  Administered 2020-11-01 (×2): 650 mg via ORAL
  Filled 2020-10-31 (×2): qty 2

## 2020-10-31 MED ORDER — CALCITRIOL 0.25 MCG PO CAPS: 0.2500 ug | ORAL_CAPSULE | ORAL | Status: DC

## 2020-10-31 MED ORDER — ALTEPLASE 2 MG IJ SOLR
2.0000 mg | Freq: Once | INTRAMUSCULAR | Status: DC | PRN
  Filled 2020-10-31: qty 2

## 2020-10-31 MED ORDER — HEPARIN SODIUM (PORCINE) 5000 UNIT/ML IJ SOLN: 5000.0000 [IU] | Freq: Three times a day (TID) | INTRAMUSCULAR | Status: DC

## 2020-10-31 MED ORDER — CAMPHOR-MENTHOL 0.5-0.5 % EX LOTN
1.0000 "application " | TOPICAL_LOTION | Freq: Three times a day (TID) | CUTANEOUS | Status: DC | PRN
  Filled 2020-10-31: qty 222

## 2020-10-31 MED ORDER — ONDANSETRON HCL 4 MG PO TABS: 4.0000 mg | ORAL_TABLET | Freq: Four times a day (QID) | ORAL | Status: DC | PRN

## 2020-10-31 MED ORDER — LIDOCAINE HCL (PF) 1 % IJ SOLN
5.0000 mL | INTRAMUSCULAR | Status: DC | PRN
  Filled 2020-10-31: qty 5

## 2020-10-31 MED ORDER — ASPIRIN EC 81 MG PO TBEC
81.0000 mg | DELAYED_RELEASE_TABLET | Freq: Every day | ORAL | Status: DC
  Administered 2020-11-01 – 2020-11-06 (×6): 81 mg via ORAL
  Filled 2020-10-31 (×6): qty 1

## 2020-10-31 MED ORDER — FUROSEMIDE 10 MG/ML IJ SOLN
80.0000 mg | Freq: Once | INTRAMUSCULAR | Status: AC
  Administered 2020-10-31: 80 mg via INTRAVENOUS
  Filled 2020-10-31: qty 8

## 2020-10-31 MED ORDER — CHLORHEXIDINE GLUCONATE CLOTH 2 % EX PADS
6.0000 | MEDICATED_PAD | Freq: Every day | CUTANEOUS | Status: DC
  Administered 2020-11-01 – 2020-11-06 (×6): 6 via TOPICAL
  Filled 2020-10-31: qty 6

## 2020-10-31 MED ORDER — SODIUM CHLORIDE 0.9% FLUSH
3.0000 mL | Freq: Two times a day (BID) | INTRAVENOUS | Status: DC
  Administered 2020-10-31 – 2020-11-06 (×12): 3 mL via INTRAVENOUS

## 2020-10-31 MED ORDER — ACETAMINOPHEN 650 MG RE SUPP: 650.0000 mg | Freq: Four times a day (QID) | RECTAL | Status: DC | PRN

## 2020-10-31 MED ORDER — HEPARIN SODIUM (PORCINE) 1000 UNIT/ML DIALYSIS
1000.0000 [IU] | INTRAMUSCULAR | Status: DC | PRN
  Filled 2020-10-31: qty 1

## 2020-10-31 MED ORDER — INSULIN GLARGINE 100 UNIT/ML ~~LOC~~ SOLN
15.0000 [IU] | Freq: Every day | SUBCUTANEOUS | Status: DC
  Administered 2020-11-01 – 2020-11-03 (×2): 15 [IU] via SUBCUTANEOUS
  Filled 2020-10-31 (×6): qty 0.15

## 2020-10-31 MED ORDER — ONDANSETRON HCL 4 MG/2ML IJ SOLN: 4.0000 mg | Freq: Four times a day (QID) | INTRAMUSCULAR | Status: DC | PRN

## 2020-10-31 MED ORDER — NITROGLYCERIN 0.4 MG SL SUBL: 0.4000 mg | SUBLINGUAL_TABLET | SUBLINGUAL | Status: DC | PRN

## 2020-10-31 MED ORDER — SODIUM CHLORIDE 0.9 % IV SOLN: 100.0000 mL | INTRAVENOUS | Status: DC | PRN

## 2020-10-31 MED ORDER — HYDRALAZINE HCL 50 MG PO TABS
50.0000 mg | ORAL_TABLET | Freq: Three times a day (TID) | ORAL | Status: DC
  Administered 2020-10-31 – 2020-11-01 (×3): 50 mg via ORAL
  Filled 2020-10-31 (×3): qty 1

## 2020-10-31 MED ORDER — NEPRO/CARBSTEADY PO LIQD: 237.0000 mL | Freq: Three times a day (TID) | ORAL | Status: DC | PRN

## 2020-10-31 MED ORDER — VITAMIN D (ERGOCALCIFEROL) 1.25 MG (50000 UNIT) PO CAPS
50000.0000 [IU] | ORAL_CAPSULE | ORAL | Status: DC
  Administered 2020-11-06: 50000 [IU] via ORAL
  Filled 2020-10-31: qty 1

## 2020-10-31 MED ORDER — ISOSORBIDE MONONITRATE ER 30 MG PO TB24
30.0000 mg | ORAL_TABLET | Freq: Every day | ORAL | Status: DC
  Administered 2020-10-31 – 2020-11-01 (×2): 30 mg via ORAL
  Filled 2020-10-31 (×3): qty 1

## 2020-10-31 MED ORDER — HYDROXYZINE HCL 25 MG PO TABS
25.0000 mg | ORAL_TABLET | Freq: Three times a day (TID) | ORAL | Status: DC | PRN
  Filled 2020-10-31: qty 1

## 2020-10-31 MED ORDER — SODIUM CHLORIDE 0.9% FLUSH: 3.0000 mL | INTRAVENOUS | Status: DC | PRN

## 2020-10-31 MED ORDER — SODIUM CHLORIDE 0.9 % IV SOLN: 250.0000 mL | INTRAVENOUS | Status: DC | PRN

## 2020-10-31 MED ORDER — LABETALOL HCL 5 MG/ML IV SOLN
10.0000 mg | Freq: Once | INTRAVENOUS | Status: AC
  Administered 2020-10-31: 10 mg via INTRAVENOUS
  Filled 2020-10-31: qty 4

## 2020-10-31 MED ORDER — ATORVASTATIN CALCIUM 10 MG PO TABS
10.0000 mg | ORAL_TABLET | Freq: Every day | ORAL | Status: DC
  Administered 2020-11-01 – 2020-11-06 (×6): 10 mg via ORAL
  Filled 2020-10-31 (×8): qty 1

## 2020-10-31 NOTE — ED Notes (Signed)
Medication Reconciliation Report  For Home History Technicians  HIGHLIGHTS:  1. The patient WAS personally interviewed 2. If not, what was the main source used: NOT APPLICABLE 3. Does the patient appear to take any anti-coagulation agents (e.g. warfarin, Eliquis or Xarelto): NO 4. Does the patient appear to take any anti-convulsant agents (e.g. divalproex, levetiracetam or phenytoin): NO 5. Does the patient appear to use any insulin products (e.g. Lantus, Novolin or Humalog): YES 6. Does the patient appear to take any "beta-blockers" (e.g. metoprolol, carvedilol or bisoprolol: NO  BARRIERS:  1. Were there any barriers that prevented or complicated the medication reconciliation process: NO 2. If yes, what was the primary barrier encountered: None 3. Does the patient appear compliant with prescribed medications: UNABLE TO DETERMINE 4. Does the patient express any barriers with compliance: NO 5. What is the primary barrier the patient reports: None   NOTES:[Include any concerns, remarks or complaints the patient expresses regarding medication therapy. Any observations or other information that might be useful to the treatment team can also be included. Immediate needs or concerns should be referred to the RN or appropriate member of the treatment team.]  Patient was discharged 10/27/2020 from [facility]. According to patient, she took clonidine, amlodipine & hydralazine this morning. When informed clonidine and amlodipine were discontinued according to discharge paperwork, she seemed unaware of that change. Additionally, patient reports injecting Novolog even though that was also stopped. Advised patient to speak to hospitalist about resuming or halting medications.        Colen Darling, CPhT [place] [facility] [address]. [place], [place] [address] [phone]  ** The above is intended solely for informational and/or communicative purposes. It should  in no way be considered an endorsement of any specific treatment, therapy or action. **

## 2020-10-31 NOTE — Assessment & Plan Note (Addendum)
Pt presenting with hypertensive urgency and acute on c/h diastolic heart failure with elevated BNP of 3000's. Home regimen to Imdur continued,last lipid panel of less than a week ago shows low hdl will start pt on low dose statin as she has not allergies. We will also obtain 2 d echo for assessment of her chronic heart failure and ,ef and WMA.  Total Chole: 88 HDL:28 LDL: 48 and trig: 60. Will also obtain TFT if none recent.

## 2020-10-31 NOTE — H&P (Addendum)
History and Physical    Renee [name] WNU:[id number] DOB: June 11, 1976 DOA: 10/31/2020  PCP: Minda Ditto, MD    Patient coming from: Home  Chief Complaint:  SOB.   HPI: Renee [name] is a 44 y.o. female with medical history significant of ESRD on Dialysis, HTN DM II,seen today as bounce back for worsening SOB and generalized weakness. Pt was recently d/c but had difficulty obtaining meds from her pharmacy, pharmacy said they did nto receive prescription , and pt does not have pcp. Daughter Recently filled her amlodipine one bottle for 40$. Pt is alert and awake and oriented and Denies any complaints with ros during hpi but endorses sob, headache, blurred vision No other complaints.    ED Course:  Blood pressure (!) 199/118, pulse 76, temperature 97.8 F (36.6 C), temperature source Oral, resp. rate (!) 22, height 5\' 5"  (1.651 m), weight 122 kg, SpO2 99 %. SpO2: 99 % O2 Flow Rate (L/min): 3 L/min On initial presentation pt was in hypertensive urgency and acute hypoxic respiratory failure with o2 sats in the 80%, suspect due to acute on chronic diastolic heart failure due to her Hypertensive urgency. Pt was started on supplemental oxygen and NTG ointment and repeat BP has been in systolic 034'V. Med rec is done and have resumed pt's home BP med and she is going dialysis today which will also help to lower her BP.Labs show baseline cmp with creatinine of 7.6 and potasium of 3.5.BNP of 3034.2 and mild elevation of wbc count to 11.3. Pt's covid is negative.   Review of Systems  Constitutional: Positive for malaise/fatigue.  Eyes: Positive for blurred vision.  Respiratory: Positive for shortness of breath.   Cardiovascular: Positive for leg swelling.    Past Medical History:  Diagnosis Date  . (HFpEF) heart failure with preserved ejection fraction ([place])   . CKD (chronic kidney disease), stage IV ([place])   . Hypertension   . Morbid obesity ([place])     Past Surgical History:    Procedure Laterality Date  . CESAREAN SECTION    . DIALYSIS/PERMA CATHETER INSERTION N/A 10/24/2020   Procedure: DIALYSIS/PERMA CATHETER INSERTION;  Surgeon: Algernon Huxley, MD;  Location: [facility];  Service: Cardiovascular;  Laterality: N/A;  . TUBAL LIGATION       reports that she has never smoked. She has never used smokeless tobacco. She reports that she does not drink alcohol and does not use drugs.  No Known Allergies  Family History  Problem Relation Age of Onset  . Epilepsy Mother   . Hypertension Mother   . Hypertension Father     Prior to Admission medications   Medication Sig Start Date End Date Taking? Authorizing Provider  calcitRIOL (ROCALTROL) 0.25 MCG capsule Take 0.25 mcg by mouth See admin instructions. MON WED SAT Patient not taking: Reported on 10/21/2020    [provider]  calcium carbonate (OS-CAL - DOSED IN MG OF ELEMENTAL CALCIUM) 1250 (500 Ca) MG tablet Take 1 tablet (1,250 mg total) by mouth 2 (two) times daily with a meal. 10/27/20   Jennye Boroughs, MD  hydrALAZINE (APRESOLINE) 50 MG tablet Take 1 tablet (50 mg total) by mouth 3 (three) times daily. 10/27/20 11/26/20  Jennye Boroughs, MD  insulin glargine (LANTUS) 100 UNIT/ML injection Inject 0.15 mLs (15 Units total) into the skin daily. 10/28/20   Jennye Boroughs, MD  isosorbide mononitrate (IMDUR) 30 MG 24 hr tablet Take 1 tablet (30 mg total) by mouth daily. 10/27/20 11/26/20  Ayiku,  Ilona Sorrel, MD  Vitamin D, Ergocalciferol, (DRISDOL) 1.25 MG (50000 UNIT) CAPS capsule Take 1 capsule (50,000 Units total) by mouth every 7 (seven) days. 10/30/20   Jennye Boroughs, MD    Physical Exam: Vitals:   10/31/20 1217 10/31/20 1218 10/31/20 1324  BP: (!) 211/117  (!) 199/118  Pulse: 95  76  Resp: 19  (!) 22  Temp:   97.8 F (36.6 C)  TempSrc:   Oral  SpO2: 100%  99%  Weight:  122 kg   Height:  5\' 5"  (1.651 m)     Constitutional: NAD, calm, comfortable Vitals:   10/31/20 1217 10/31/20  1218 10/31/20 1324  BP: (!) 211/117  (!) 199/118  Pulse: 95  76  Resp: 19  (!) 22  Temp:   97.8 F (36.6 C)  TempSrc:   Oral  SpO2: 100%  99%  Weight:  122 kg   Height:  5\' 5"  (1.651 m)    Physical Exam Vitals and nursing note reviewed.  Constitutional:      Appearance: She is obese.     Interventions: Nasal cannula in place.  HENT:     Head: Normocephalic and atraumatic.     Right Ear: Hearing normal.     Left Ear: Hearing normal.     Nose: Nose normal.     Mouth/Throat:     Mouth: Mucous membranes are dry.     Dentition: Normal dentition.     Tongue: No lesions.     Pharynx: Oropharynx is clear.  Eyes:     General: Lids are normal.     Extraocular Movements: Extraocular movements intact.     Right eye: Normal extraocular motion and no nystagmus.     Conjunctiva/sclera: Conjunctivae normal.     Pupils: Pupils are equal, round, and reactive to light.  Neck:     Thyroid: No thyroid mass.     Vascular: No carotid bruit or JVD.  Cardiovascular:     Rate and Rhythm: Normal rate and regular rhythm.     Pulses:          Dorsalis pedis pulses are 1+ on the right side and 1+ on the left side.       Posterior tibial pulses are 1+ on the right side and 1+ on the left side.  Pulmonary:     Effort: Pulmonary effort is normal. No accessory muscle usage, prolonged expiration or respiratory distress.     Breath sounds: Decreased air movement present. Examination of the right-lower field reveals decreased breath sounds. Examination of the left-lower field reveals decreased breath sounds. Decreased breath sounds present.  Abdominal:     General: Bowel sounds are normal.     Palpations: Abdomen is soft. There is no hepatomegaly, splenomegaly or mass.     Tenderness: There is no abdominal tenderness. There is no guarding.     Hernia: No hernia is present.  Musculoskeletal:     Cervical back: Normal range of motion.     Right lower leg: 2+ Pitting Edema present.     Left lower leg: 2+  Pitting Edema present.  Feet:     Right foot:     Skin integrity: Skin integrity normal.     Left foot:     Skin integrity: Skin integrity normal.  Lymphadenopathy:     Cervical: No cervical adenopathy.  Skin:    General: Skin is cool.     Findings: No rash.  Neurological:     General: No focal deficit present.  Mental Status: She is alert and oriented to person, place, and time.     Cranial Nerves: Cranial nerves are intact.  Psychiatric:        Mood and Affect: Mood normal.        Speech: Speech normal.        Behavior: Behavior normal.     Labs on Admission: I have personally reviewed following labs and imaging studies  CBC: Recent Labs  Lab 10/25/20 0522 10/26/20 0616 10/27/20 0346 10/31/20 1229  WBC 8.2 8.9 8.6 11.3*  NEUTROABS  --   --   --  9.8*  HGB 8.0* 7.8* 7.8* 9.2*  HCT 25.9* 25.5* 25.4* 31.2*  MCV 76.4* 76.6* 78.4* 80.6  PLT 268 258 261 382   Basic Metabolic Panel: Recent Labs  Lab 10/25/20 0522 10/26/20 0616 10/26/20 1119 10/27/20 0346 10/31/20 1229  NA 134* 137  --  139 143  K 3.3* 3.3*  --  3.5 3.5  CL 100 101  --  104 105  CO2 17* 22  --  24 24  GLUCOSE 115* 127*  --  106* 126*  BUN 121* 85*  --  51* 49*  CREATININE 11.93* 9.34*  --  6.65* 7.68*  CALCIUM 6.7* 7.3*  --  7.4* 9.0  PHOS 8.5* 6.2* 6.6* 5.4*  --    GFR: Estimated Creatinine Clearance: 12.1 mL/min (A) (by C-G formula based on SCr of 7.68 mg/dL (H)). Liver Function Tests: Recent Labs  Lab 10/25/20 0522 10/26/20 0616 10/27/20 0346 10/31/20 1229  AST  --   --   --  26  ALT  --   --   --  29  ALKPHOS  --   --   --  142*  BILITOT  --   --   --  0.9  PROT  --   --   --  7.8  ALBUMIN 3.0* 2.7* 2.8* 3.8   No results for input(s): LIPASE, AMYLASE in the last 168 hours. No results for input(s): AMMONIA in the last 168 hours. Coagulation Profile: No results for input(s): INR, PROTIME in the last 168 hours. Cardiac Enzymes: No results for input(s): CKTOTAL, CKMB,  CKMBINDEX, TROPONINI in the last 168 hours. BNP (last 3 results) No results for input(s): PROBNP in the last 8760 hours. HbA1C: No results for input(s): HGBA1C in the last 72 hours. CBG: Recent Labs  Lab 10/26/20 1140 10/26/20 1654 10/26/20 2131 10/27/20 0802 10/27/20 1156  GLUCAP 127* 83 124* 106* 194*   Lipid Profile: No results for input(s): CHOL, HDL, LDLCALC, TRIG, CHOLHDL, LDLDIRECT in the last 72 hours. Thyroid Function Tests: No results for input(s): TSH, T4TOTAL, FREET4, T3FREE, THYROIDAB in the last 72 hours. Anemia Panel: No results for input(s): VITAMINB12, FOLATE, FERRITIN, TIBC, IRON, RETICCTPCT in the last 72 hours. Urine analysis:    Component Value Date/Time   COLORURINE YELLOW (A) 10/21/2020 0458   APPEARANCEUR CLOUDY (A) 10/21/2020 0458   LABSPEC 1.011 10/21/2020 0458   PHURINE 5.0 10/21/2020 0458   GLUCOSEU NEGATIVE 10/21/2020 0458   HGBUR SMALL (A) 10/21/2020 0458   BILIRUBINUR NEGATIVE 10/21/2020 0458   KETONESUR NEGATIVE 10/21/2020 0458   PROTEINUR 100 (A) 10/21/2020 0458   NITRITE POSITIVE (A) 10/21/2020 0458   LEUKOCYTESUR SMALL (A) 10/21/2020 0458   No intake or output data in the 24 hours ending 10/31/20 1515 Lab Results  Component Value Date   CREATININE 7.68 (H) 10/31/2020   CREATININE 6.65 (H) 10/27/2020   CREATININE 9.34 (H) 10/26/2020  COVID-19 Labs  No results for input(s): DDIMER, FERRITIN, LDH, CRP in the last 72 hours.  Lab Results  Component Value Date   SARSCOV2NAA NEGATIVE 10/31/2020   [place] NEGATIVE 10/21/2020   [place] NEGATIVE 01/18/2020   SARSCOV2NAA NOT DETECTED 08/26/2019    Radiological Exams on Admission: DG Chest 1 View  Result Date: 10/31/2020 CLINICAL DATA:  Shortness of breath. EXAM: CHEST  1 VIEW COMPARISON:  October 20, 2020. FINDINGS: Stable cardiomegaly. No pneumothorax is noted. Right internal jugular dialysis catheter is noted. Mild bibasilar atelectasis is noted with small pleural  effusions. Bony thorax is unremarkable. IMPRESSION: Mild bibasilar subsegmental atelectasis with small pleural effusions. Electronically Signed   By: Marijo Conception M.D.   On: 10/31/2020 13:34    EKG: Independently reviewed.    Assessment/Plan Pt is a 44 y/o female admitted with respiratory distress and found ot be hypoxic with hypertensive urgency and we will admit to progressive cardiac unit with telemetry and assess further with 2 d echo to start.  Chronic diastolic heart failure with preserved ejection fraction (HCC)/ Acute Hypoxic respiratory failure. Assessment & Plan Pt presenting with hypertensive urgency and acute on c/h diastolic heart failure with elevated BNP of 3000's. Home regimen to Imdur continued,last lipid panel of less than a week ago shows low hdl will start pt on low dose statin as she has not allergies. We will also obtain 2 d echo for assessment of her chronic heart failure and ,ef and WMA. Strict I/O and daily weights.  Cardiac low sodium diet. Total Chole: 88 HDL:28 LDL: 48 and trig: 60. Will also obtain TFT if none recent. Will continue supplemental oxygen and wean. Current oxygen: SpO2: 99 % O2 Flow Rate (L/min): 3 L/min   Hypertensive urgency Assessment & Plan Repeat bp is systolic 814 we will resume home hydralazine, cont ntg paste and prn hydralazine. On her way to dialysis. Blood pressure (!) 199/118, pulse 76, temperature 97.8 F (36.6 C), temperature source Oral, resp. rate (!) 22, height 5\' 5"  (1.651 m), weight 122 kg, SpO2 99 %. We will also continue home meds: Imdur 30 mg Daily. Hydralazine 50 mg TID. Sodium restriction DISCUSSED WITH PATIENT AT LENGTH ALONG WITH COMPLICATION OF HTN , STROKE, HEART ATTACK, RETINAL BLEEDS, MI.,etc.Pt verbalized understanding.    Diabetes mellitus, type 2 ([place]) Assessment & Plan Pt's last hba1c was 5.9 and I have cont her home regimen of Lantus. Glycemic protocol added. Carb consistent cardiac, renal diet.      ESRD (end stage renal disease) on dialysis [facility]) Assessment & Plan Pt resenting with hypertensive urgency and Acute on c/h diastolic heart failure. Pt is going top dialysis today. Last 3 creatinine: Lab Results  Component Value Date   CREATININE 7.68 (H) 10/31/2020   CREATININE 6.65 (H) 10/27/2020   CREATININE 9.34 (H) 10/26/2020  Greatly appreciate nephrology consult.  Anemia Assessment & Plan Pt has anemia chronically , suspect due to her renal disease. However we will obtain Iron studies and correct  As needed.   DVT prophylaxis:  Heparin Code Status:  Full Code   Family Communication:  Daughter   Disposition Plan:  Home   Consults called:  Nephrology: Dr.Kolluru.  Admission status: Status is: Inpatient  Remains inpatient appropriate because:Hemodynamically unstable  Dispo: The patient is from: Home              Anticipated d/c is to: Home              Anticipated d/c date is: 2  days              Patient currently is not medically stable to d/c.  Para Skeans MD [facility] Pager [phone] If 7PM-7AM, please contact night-coverage [email or web address] Password [facility] 10/31/2020, 3:15 PM

## 2020-10-31 NOTE — Progress Notes (Addendum)
[facility]  ROUNDING NOTE   Subjective:   Renee [name] is a 44 years old female with chronic kidney disease, hypertension, obesity, heart failure with preserved ejection fraction, presented to the emergency room on 10/20/2020 with worsening shortness of breath and bilateral lower extremity edema, started on hemodialysis on 10/24/20 .Patient was discharged home last week. She presented today to the ED with worsening SOB, found to be in hypertensive urgency.  We will plan to dialyze her today.Orders placed.    Objective:  Vital signs in last 24 hours:  Temp:  [97.8 F (36.6 C)] 97.8 F (36.6 C) (11/01 1324) Pulse Rate:  [76-95] 83 (11/01 1500) Resp:  [19-24] 19 (11/01 1430) BP: (193-222)/(100-128) 193/100 (11/01 1500) SpO2:  [96 %-100 %] 99 % (11/01 1500) Weight:  [122 kg] 122 kg (11/01 1218)  Weight change:  Filed Weights   10/31/20 1218  Weight: 122 kg    Intake/Output: No intake/output data recorded.   Intake/Output this shift:  No intake/output data recorded.  Physical Exam: General:  In no acute distress  Head:  Oral mucous membranes moist  Eyes:  Sclera and conjunctiva clear  Lungs:   Lungs clear but diminished at the bases, normal and symmetrical respiratory effort  Heart:  Regular rate and rhythm  Abdomen:   Nondistended  Extremities:  2+ peripheral edema  Neurologic:  Oriented x3  Skin: No acute lesions or rashes  Access:  Rt IJ Permcath    Basic Metabolic Panel: Recent Labs  Lab 10/25/20 0522 10/25/20 0522 10/26/20 0616 10/26/20 1119 10/27/20 0346 10/31/20 1229  NA 134*  --  137  --  139 143  K 3.3*  --  3.3*  --  3.5 3.5  CL 100  --  101  --  104 105  CO2 17*  --  22  --  24 24  GLUCOSE 115*  --  127*  --  106* 126*  BUN 121*  --  85*  --  51* 49*  CREATININE 11.93*  --  9.34*  --  6.65* 7.68*  CALCIUM 6.7*   < > 7.3*  --  7.4* 9.0  PHOS 8.5*  --  6.2* 6.6* 5.4*  --    < > = values in this interval not displayed.    Liver  Function Tests: Recent Labs  Lab 10/25/20 0522 10/26/20 0616 10/27/20 0346 10/31/20 1229  AST  --   --   --  26  ALT  --   --   --  29  ALKPHOS  --   --   --  142*  BILITOT  --   --   --  0.9  PROT  --   --   --  7.8  ALBUMIN 3.0* 2.7* 2.8* 3.8   No results for input(s): LIPASE, AMYLASE in the last 168 hours. No results for input(s): AMMONIA in the last 168 hours.  CBC: Recent Labs  Lab 10/25/20 0522 10/26/20 0616 10/27/20 0346 10/31/20 1229  WBC 8.2 8.9 8.6 11.3*  NEUTROABS  --   --   --  9.8*  HGB 8.0* 7.8* 7.8* 9.2*  HCT 25.9* 25.5* 25.4* 31.2*  MCV 76.4* 76.6* 78.4* 80.6  PLT 268 258 261 266    Cardiac Enzymes: No results for input(s): CKTOTAL, CKMB, CKMBINDEX, TROPONINI in the last 168 hours.  BNP: Invalid input(s): POCBNP  CBG: Recent Labs  Lab 10/26/20 1140 10/26/20 1654 10/26/20 2131 10/27/20 0802 10/27/20 1156  GLUCAP 127* 83 124* 106*  66*    Microbiology: Results for orders placed or performed during the hospital encounter of 10/31/20  Respiratory Panel by RT PCR (Flu A&B, Covid) - Nasopharyngeal Swab     Status: None   Collection Time: 10/31/20  1:02 PM   Specimen: Nasopharyngeal Swab  Result Value Ref Range Status   SARS Coronavirus 2 by RT PCR NEGATIVE NEGATIVE Final    Comment: (NOTE) SARS-CoV-2 target nucleic acids are NOT DETECTED.  The SARS-CoV-2 RNA is generally detectable in upper respiratoy specimens during the acute phase of infection. The lowest concentration of SARS-CoV-2 viral copies this assay can detect is 131 copies/mL. A negative result does not preclude SARS-Cov-2 infection and should not be used as the sole basis for treatment or other patient management decisions. A negative result may occur with  improper specimen collection/handling, submission of specimen other than nasopharyngeal swab, presence of viral mutation(s) within the areas targeted by this assay, and inadequate number of viral copies (<131 copies/mL). A  negative result must be combined with clinical observations, patient history, and epidemiological information. The expected result is Negative.  Fact Sheet for Patients:  PinkCheek.be  Fact Sheet for Healthcare Providers:  GravelBags.it  This test is no t yet approved or cleared by the [place] FDA and  has been authorized for detection and/or diagnosis of SARS-CoV-2 by FDA under an Emergency Use Authorization (EUA). This EUA will remain  in effect (meaning this test can be used) for the duration of the COVID-19 declaration under Section 564(b)(1) of the Act, 21 U.S.C. section 360bbb-3(b)(1), unless the authorization is terminated or revoked sooner.     Influenza A by PCR NEGATIVE NEGATIVE Final   Influenza B by PCR NEGATIVE NEGATIVE Final    Comment: (NOTE) The Xpert Xpress SARS-CoV-2/FLU/RSV assay is intended as an aid in  the diagnosis of influenza from Nasopharyngeal swab specimens and  should not be used as a sole basis for treatment. Nasal washings and  aspirates are unacceptable for Xpert Xpress SARS-CoV-2/FLU/RSV  testing.  Fact Sheet for Patients: PinkCheek.be  Fact Sheet for Healthcare Providers: GravelBags.it  This test is not yet approved or cleared by the [place] FDA and  has been authorized for detection and/or diagnosis of SARS-CoV-2 by  FDA under an Emergency Use Authorization (EUA). This EUA will remain  in effect (meaning this test can be used) for the duration of the  Covid-19 declaration under Section 564(b)(1) of the Act, 21  U.S.C. section 360bbb-3(b)(1), unless the authorization is  terminated or revoked. Performed at [facility], [address] [place], [place] [address]     Coagulation Studies: No results for input(s): LABPROT, INR in the last 72 hours.  Urinalysis: No results for input(s): COLORURINE,  LABSPEC, PHURINE, GLUCOSEU, HGBUR, BILIRUBINUR, KETONESUR, PROTEINUR, UROBILINOGEN, NITRITE, LEUKOCYTESUR in the last 72 hours.  Invalid input(s): APPERANCEUR    Imaging: DG Chest 1 View  Result Date: 10/31/2020 CLINICAL DATA:  Shortness of breath. EXAM: CHEST  1 VIEW COMPARISON:  October 20, 2020. FINDINGS: Stable cardiomegaly. No pneumothorax is noted. Right internal jugular dialysis catheter is noted. Mild bibasilar atelectasis is noted with small pleural effusions. Bony thorax is unremarkable. IMPRESSION: Mild bibasilar subsegmental atelectasis with small pleural effusions. Electronically Signed   By: Marijo Conception M.D.   On: 10/31/2020 13:34     Medications:   . sodium chloride    . sodium chloride    . sodium chloride     . [START ON 11/01/2020] aspirin EC  81 mg Oral Daily  . atorvastatin  10 mg Oral Daily  . calcitRIOL  0.25 mcg Oral See admin instructions  . calcium carbonate  1,250 mg Oral BID WC  . [START ON 11/01/2020] Chlorhexidine Gluconate Cloth  6 each Topical Q0600  . heparin  5,000 Units Subcutaneous Q8H  . hydrALAZINE  50 mg Oral TID  . insulin glargine  15 Units Subcutaneous Daily  . isosorbide mononitrate  30 mg Oral Daily  . pantoprazole (PROTONIX) IV  40 mg Intravenous Q12H  . sodium chloride flush  3 mL Intravenous Q12H  . Vitamin D (Ergocalciferol)  50,000 Units Oral Q7 days   sodium chloride, sodium chloride, sodium chloride, acetaminophen **OR** acetaminophen, alteplase, calcium carbonate, camphor-menthol **AND** hydrOXYzine, docusate sodium, feeding supplement (NEPRO CARB STEADY), heparin, lidocaine (PF), lidocaine-prilocaine, nitroGLYCERIN, pentafluoroprop-tetrafluoroeth, sodium chloride flush, sorbitol, zolpidem  Assessment/ Plan  Renee [name] is a 44 y.o.  female with chronic kidney disease, hypertension, obesity, heart failure with preserved ejection fraction, presented to the emergency room on 10/20/2020 with worsening shortness of breath and  bilateral lower extremity edema  # Acute on CKD stage V  Baseline creatinine on 01/23/2020 4.90 GFR 12 Lab Results  Component Value Date   CREATININE 7.68 (H) 10/31/2020   CREATININE 6.65 (H) 10/27/2020   CREATININE 9.34 (H) 10/26/2020   Planning for dialysis today Orders placed. We will continue MWF schedule  # Hypertensive Urgency Patient was not taking her antihypertensives at home Blood Pressure 222/124 in ED Patient is on Hydralazine,Imdur, Labetalol and received IV Furosemide in ED  #Secondary hyperparathyroidism Calcium 9.0 Phosphorus 5.4 on 10/27/2020 On Calcitriol and Calcium Carbonate We will continue monitoring  #Anemia of chronic kidney disease Hemoglobin 9.2 Will continue monitoring  #Diabetes Type 2 with CKD Patient is on long-acting and short-acting insulins Blood glucose levels stay within acceptable range     LOS: 0 Princy Raju 11/1/20213:37 PM    Patient evaluated by Crosby Oyster, DNP.  Patient is new to dialysis and scheduled for hemodialysis MWF. Last outpatient treatment was Friday, 10/29.  Hemodialysis scheduled for today.  Above plan was discussed and agreed upon on the signing of this note.   Lavonia Dana, [facility]  11/1/20214:19 PM

## 2020-10-31 NOTE — Assessment & Plan Note (Signed)
Pt has anemia chronically , suspect due to her renal disease. However we will obtain Iron studies and correct  As needed.

## 2020-10-31 NOTE — ED Notes (Signed)
Pt transported to dialysis

## 2020-10-31 NOTE — ED Provider Notes (Signed)
[facility] Emergency Department Provider Note  ____________________________________________   First MD Initiated Contact with Patient 10/31/20 1242     (approximate)  I have reviewed the triage vital signs and the nursing notes.   HISTORY  Chief Complaint Shortness of Breath   HPI Renee [name] is a 44 y.o. 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  Patient states she did go to dialysis on 10/29 got a full session and.  She states that all of her symptoms started yesterday.  She denies any headache, fevers, chills, chest pain abdominal pain, current nausea, dysuria, blood in her urine, blood in her stool, rash, focal extremity pain, other acute complaints.  States she feels similar to how she did before being hospitalized.  She states she has not been able to fill any of her medications since she was discharged.  Denies EtOH use, with drug use, tobacco use.  Patient presents on 3 L nasal cannula was placed by EMS for hypoxia and patient states he does not normally wear any oxygen.         Past Medical History:  Diagnosis Date  . (HFpEF) heart failure with preserved ejection fraction ([place])   . CKD (chronic kidney disease), stage IV ([place])   . Hypertension   . Morbid obesity [facility])     Patient Active Problem List   Diagnosis Date Noted  . ESRD (end stage renal disease) ([place]) 10/26/2020  . Acute CHF (congestive heart failure) ([place]) 10/21/2020  . ARF (acute renal failure) ([place]) 10/21/2020  . Type II diabetes mellitus with renal manifestations ([place]) 10/21/2020  .  Chest pain 10/21/2020  . Elevated troponin 10/21/2020  . UTI (urinary tract infection) 10/21/2020  . Hypocalcemia 10/21/2020  . HLD (hyperlipidemia) 10/21/2020  . Anasarca associated with disorder of kidney   . Chronic anemia   . Hyperglycemic hyperosmolar nonketotic coma ([place])   . Status epilepticus ([place]) 01/19/2020  . Acute pulmonary edema ([place]) 08/27/2019  . HTN (hypertension), malignant 08/26/2019  . HTN (hypertension) 01/29/2019  . Chronic diastolic heart failure ([place]) 08/14/2017  . Hypertensive emergency 07/05/2017  . 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 Acute on chronic renal failure ([place]) 06/14/2017  . Hyponatremia 06/14/2017  . Hypokalemia 06/14/2017  . Leg swelling 06/14/2017  . Cardiomyopathy ([place]) 04/08/2015  . Accelerated hypertension 04/08/2015  . Diabetes mellitus, type 2 ([place]) 04/08/2015    Past Surgical History:  Procedure Laterality Date  . CESAREAN SECTION    . DIALYSIS/PERMA CATHETER INSERTION N/A 10/24/2020   Procedure: DIALYSIS/PERMA CATHETER INSERTION;  Surgeon: Algernon Huxley, MD;  Location: [place] [facility];  Service: Cardiovascular;  Laterality: N/A;  . TUBAL LIGATION      Prior to Admission medications   Medication Sig Start Date End Date Taking? Authorizing Provider  calcitRIOL (ROCALTROL) 0.25 MCG capsule Take 0.25 mcg by mouth See admin instructions. MON WED SAT Patient not taking: Reported on 10/21/2020    [provider]  calcium carbonate (OS-CAL - DOSED IN MG OF ELEMENTAL CALCIUM) 1250 (500 Ca) MG tablet Take 1 tablet (1,250 mg total) by mouth 2 (two) times daily with a meal. 10/27/20   Jennye Boroughs, MD  hydrALAZINE (  APRESOLINE) 50 MG tablet Take 1 tablet (50 mg total) by mouth 3 (three) times daily. 10/27/20 11/26/20  Jennye Boroughs, MD  insulin glargine (LANTUS) 100 UNIT/ML injection Inject 0.15 mLs (15 Units total) into the skin daily. 10/28/20   Jennye Boroughs, MD  isosorbide mononitrate (IMDUR) 30 MG 24 hr tablet Take 1 tablet  (30 mg total) by mouth daily. 10/27/20 11/26/20  Jennye Boroughs, MD  Vitamin D, Ergocalciferol, (DRISDOL) 1.25 MG (50000 UNIT) CAPS capsule Take 1 capsule (50,000 Units total) by mouth every 7 (seven) days. 10/30/20   Jennye Boroughs, MD    Allergies Patient has no known allergies.  Family History  Problem Relation Age of Onset  . Epilepsy Mother   . Hypertension Mother   . Hypertension Father     Social History Social History   Tobacco Use  . Smoking status: Never Smoker  . Smokeless tobacco: Never Used  Vaping Use  . Vaping Use: Never used  Substance Use Topics  . Alcohol use: No  . Drug use: No    Review of Systems  Review of Systems  Constitutional: Positive for malaise/fatigue. Negative for chills and fever.  HENT: Negative for sore throat.   Eyes: Negative for pain.  Respiratory: Positive for cough and shortness of breath. Negative for stridor.   Cardiovascular: Negative for chest pain.  Gastrointestinal: Positive for vomiting.  Genitourinary: Negative for dysuria.  Musculoskeletal: Negative for myalgias.  Skin: Negative for rash.  Neurological: Negative for seizures, loss of consciousness and headaches.  Psychiatric/Behavioral: Negative for suicidal ideas.  All other systems reviewed and are negative.     ____________________________________________   PHYSICAL EXAM:  VITAL SIGNS: ED Triage Vitals  Enc Vitals Group     BP 10/31/20 1217 (!) 211/117     Pulse Rate 10/31/20 1217 95     Resp 10/31/20 1217 19     Temp --      Temp src --      SpO2 10/31/20 1217 100 %     Weight 10/31/20 1218 268 lb 15.4 oz (122 kg)     Height 10/31/20 1218 5\' 5"  (1.651 m)     Head Circumference --      Peak Flow --      Pain Score 10/31/20 1218 0     Pain Loc --      Pain Edu? --      Excl. in [name]? --    Vitals:   10/31/20 1217 10/31/20 1324  BP: (!) 211/117 (!) 199/118  Pulse: 95 76  Resp: 19 (!) 22  Temp:  97.8 F (36.6 C)  SpO2: 100% 99%   Physical  Exam Vitals and nursing note reviewed.  Constitutional:      General: She is not in acute distress.    Appearance: She is well-developed.  HENT:     Head: Normocephalic and atraumatic.     Right Ear: External ear normal.     Left Ear: External ear normal.     Nose: Nose normal.     Mouth/Throat:     Mouth: Mucous membranes are dry.  Eyes:     Conjunctiva/sclera: Conjunctivae normal.  Cardiovascular:     Rate and Rhythm: Normal rate and regular rhythm.     Heart sounds: No murmur heard.   Pulmonary:     Effort: Pulmonary effort is normal. Tachypnea present. No respiratory distress.     Breath sounds: Decreased breath sounds present.  Abdominal:     General: There is distension.  Palpations: Abdomen is soft.     Tenderness: There is no abdominal tenderness.  Musculoskeletal:     Cervical back: Neck supple.     Right lower leg: Edema present.     Left lower leg: Edema present.  Skin:    General: Skin is warm and dry.     Capillary Refill: Capillary refill takes 2 to 3 seconds.  Neurological:     Mental Status: She is alert and oriented to person, place, and time.  Psychiatric:        Mood and Affect: Mood normal.      ____________________________________________   LABS (all labs ordered are listed, but only abnormal results are displayed)  Labs Reviewed  CBC WITH DIFFERENTIAL/PLATELET - Abnormal; Notable for the following components:      Result Value   WBC 11.3 (*)    Hemoglobin 9.2 (*)    HCT 31.2 (*)    MCH 23.8 (*)    MCHC 29.5 (*)    RDW 18.6 (*)    Neutro Abs 9.8 (*)    All other components within normal limits  COMPREHENSIVE METABOLIC PANEL - Abnormal; Notable for the following components:   Glucose, Bld 126 (*)    BUN 49 (*)    Creatinine, Ser 7.68 (*)    Alkaline Phosphatase 142 (*)    GFR, Estimated 6 (*)    All other components within normal limits  BRAIN NATRIURETIC PEPTIDE - Abnormal; Notable for the following components:   B Natriuretic  Peptide 3,034.2 (*)    All other components within normal limits  TROPONIN I (HIGH SENSITIVITY) - Abnormal; Notable for the following components:   Troponin I (High Sensitivity) 59 (*)    All other components within normal limits  RESPIRATORY PANEL BY RT PCR (FLU A&B, COVID)   ____________________________________________  EKG  Sinus rhythm with a ventricular of 78, normal axis, prolonged QTc interval at 516, T wave inversion in aVL which is unchanged when compared to prior ECG obtained on 10/29 with otherwise no evidence of acute ischemia or other underlying arrhythmia. ____________________________________________  RADIOLOGY  ED MD interpretation: Bilateral pulmonary edema with small bilateral effusions.  No focal consolidation, thorax, or other acute intrathoracic process.  Official radiology report(s): DG Chest 1 View  Result Date: 10/31/2020 CLINICAL DATA:  Shortness of breath. EXAM: CHEST  1 VIEW COMPARISON:  October 20, 2020. FINDINGS: Stable cardiomegaly. No pneumothorax is noted. Right internal jugular dialysis catheter is noted. Mild bibasilar atelectasis is noted with small pleural effusions. Bony thorax is unremarkable. IMPRESSION: Mild bibasilar subsegmental atelectasis with small pleural effusions. Electronically Signed   By: Marijo Conception M.D.   On: 10/31/2020 13:34    ____________________________________________   PROCEDURES  Procedure(s) performed (including Critical Care):  .1-3 Lead EKG Interpretation Performed by: Lucrezia Starch, MD Authorized by: Lucrezia Starch, MD     Interpretation: normal     ECG rate assessment: normal     Rhythm: sinus rhythm     Ectopy: none     Conduction: normal   .Critical Care Performed by: Lucrezia Starch, MD Authorized by: Lucrezia Starch, MD   Critical care provider statement:    Critical care time (minutes):  35   Critical care time was exclusive of:  Separately billable procedures and treating other patients    Critical care was necessary to treat or prevent imminent or life-threatening deterioration of the following conditions:  Respiratory failure   Critical care was time spent personally by  me on the following activities:  Discussions with consultants, evaluation of patient's response to treatment, examination of patient, ordering and performing treatments and interventions, ordering and review of laboratory studies, ordering and review of radiographic studies, pulse oximetry, re-evaluation of patient's condition, obtaining history from patient or surrogate and review of old charts     ____________________________________________   [place] / [place] / ED COURSE        Patient presents with [place] to history exam for assessment of cough and shortness of breath associate with some vomiting yesterday in the setting of not going to dialysis morning.  Patient also has not been taking her medication over the last 3 days since being discharged.  On arrival patient is hypertensive with a BP of 211/117 with otherwise stable vital signs on room air.  Exam as above.  Differential includes but is not limited to cough shortness of breath and vomiting secondary to metabolic derangements, anemia, ACS as well as risk for symptoms related to volume overload, hypertensive emergency, arrhythmia, pneumonia, effusion.  CBC remarkable for slight leukocytosis with a WBC count of 11.3 and anemia with a hemoglobin of 9.2 with normal platelets.   Anemia appears to be patient's baseline is she had a hemoglobin of 7.84 days ago and 8 to 7 days ago.  Patient's troponin is also slightly elevated at 59 although this is at baseline compared to troponin IX days ago of 60 and 10 days ago of 75.  Suspect this is likely chronically elevated due to CKD versus a mild demand ischemia.  CMP remarkable for BUN of 49 and a creatinine of 7.68 with no other significant electrolyte or metabolic derangements.  This is compared  to a CMP obtained 4 days ago with a creatinine of 6.65 with a BUN of 51.  BNP is significantly elevated at slightly above 3000 compared to 212 9 months ago.  In addition patient appears volume overloaded on exam and chest x-ray and I suspect not going to assess today's contributed to her respiratory failure and volume overload.  Nephrology service consulted who stated they would advised the patient.  I spoke with Dr. Juleen China.   Admit to medicine service for further evaluation management including plan dialysis.  I also ordered some Lasix if patient still makes some urine I believe volume overload is the driving factor for symptoms.      ____________________________________________   FINAL CLINICAL IMPRESSION(S) / ED DIAGNOSES  Final diagnoses:  Acute respiratory failure with hypoxia (HCC)  Hypervolemia, unspecified hypervolemia type  Elevated brain natriuretic peptide (BNP) level  Troponin I above reference range    Medications  hydrALAZINE (APRESOLINE) tablet 50 mg (50 mg Oral Given 10/31/20 1318)  isosorbide mononitrate (IMDUR) 24 hr tablet 30 mg (has no administration in time range)  labetalol (NORMODYNE) injection 10 mg (10 mg Intravenous Given 10/31/20 1318)  furosemide (LASIX) injection 80 mg (80 mg Intravenous Given 10/31/20 1404)  acetaminophen (TYLENOL) tablet 1,000 mg (1,000 mg Oral Given 10/31/20 1403)     ED Discharge Orders    None       Note:  This document was prepared using Dragon voice recognition software and may include unintentional dictation errors.   Lucrezia Starch, MD 10/31/20 1410

## 2020-10-31 NOTE — Assessment & Plan Note (Signed)
Pt's last hba1c was 5.9 and I have cont her home regimen of Lantus. Glycemic protocol added. Carb consistent cardiac, renal diet.

## 2020-10-31 NOTE — ED Triage Notes (Signed)
Pt arrives via ems from home. Had full scheduled dialysis on Friday, dialysis was scheduled today, but unable to go due to increased sob since d/c from [facility] on thursday. Unable to get new diuretic and BP medications filled due to no insurance   BP 226/130 2" nitro paste, 2 sl nitro.  Crackles bilateral 83% room air, 98% 4L [place].

## 2020-10-31 NOTE — Assessment & Plan Note (Signed)
Repeat bp is systolic 460 we will resume home hydralazine, cont ntg paste and prn hydralazine. On her way to dialysis. Blood pressure (!) 199/118, pulse 76, temperature 97.8 F (36.6 C), temperature source Oral, resp. rate (!) 22, height 5\' 5"  (1.651 m), weight 122 kg, SpO2 99 %. We will also continue home meds: Imdur 30 mg Daily. Hydralazine 50 mg TID. Sodium restriction DISCUSSED WITH PATIENT AT LENGTH ALONG WITH COMPLICATION OF HTN , STROKE, HEART ATTACK, RETINAL BLEEDS, MI.,etc.Pt verbalized understanding.

## 2020-10-31 NOTE — Assessment & Plan Note (Signed)
Pt resenting with hypertensive urgency and Acute on c/h diastolic heart failure. Pt is going top dialysis today. Greatly appreciate nephrology consult.

## 2020-11-01 ENCOUNTER — Inpatient Hospital Stay
Admit: 2020-11-01 | Discharge: 2020-11-01 | Disposition: A | Discharge status: Home / Self Care | Payer: Medicare Other | Attending: Internal Medicine | Admitting: Internal Medicine

## 2020-11-01 DIAGNOSIS — I5032 Chronic diastolic (congestive) heart failure: Secondary | ICD-10-CM | POA: Diagnosis not present

## 2020-11-01 DIAGNOSIS — I160 Hypertensive urgency: Secondary | ICD-10-CM | POA: Diagnosis not present

## 2020-11-01 DIAGNOSIS — N185 Chronic kidney disease, stage 5: Secondary | ICD-10-CM

## 2020-11-01 DIAGNOSIS — E1122 Type 2 diabetes mellitus with diabetic chronic kidney disease: Secondary | ICD-10-CM | POA: Diagnosis not present

## 2020-11-01 DIAGNOSIS — D631 Anemia in chronic kidney disease: Secondary | ICD-10-CM

## 2020-11-01 LAB — IRON AND TIBC
Iron: 46 ug/dL (ref 28–170)
Saturation Ratios: 14 % (ref 10.4–31.8)
TIBC: 340 ug/dL (ref 250–450)
UIBC: 294 ug/dL

## 2020-11-01 LAB — GLUCOSE, CAPILLARY
Glucose-Capillary: 110 mg/dL — ABNORMAL HIGH (ref 70–99)
Glucose-Capillary: 127 mg/dL — ABNORMAL HIGH (ref 70–99)
Glucose-Capillary: 115 mg/dL — ABNORMAL HIGH (ref 70–99)
Glucose-Capillary: 109 mg/dL — ABNORMAL HIGH (ref 70–99)

## 2020-11-01 LAB — ECHOCARDIOGRAM COMPLETE
AR max vel: 2.9 cm2
AV Area VTI: 3.27 cm2
AV Area mean vel: 3.05 cm2
AV Mean grad: 4 mmHg
AV Peak grad: 10.9 mmHg
Ao pk vel: 1.65 m/s
Area-P 1/2: 7.16 cm2
Height: 65 in
S' Lateral: 3.35 cm
Weight: 4303.38 oz

## 2020-11-01 LAB — VITAMIN B12: Vitamin B-12: 878 pg/mL (ref 180–914)

## 2020-11-01 LAB — RETICULOCYTES
Immature Retic Fract: 23.6 % — ABNORMAL HIGH (ref 2.3–15.9)
RBC.: 3.72 MIL/uL — ABNORMAL LOW (ref 3.87–5.11)
Retic Count, Absolute: 84.4 10*3/uL (ref 19.0–186.0)
Retic Ct Pct: 2.3 % (ref 0.4–3.1)

## 2020-11-01 LAB — FERRITIN: Ferritin: 194 ng/mL (ref 11–307)

## 2020-11-01 LAB — FOLATE: Folate: 7.8 ng/mL (ref >5.9)

## 2020-11-01 LAB — T4, FREE: Free T4: 1.71 ng/dL — ABNORMAL HIGH (ref 0.61–1.12)

## 2020-11-01 LAB — TSH: TSH: 4.538 u[IU]/mL — ABNORMAL HIGH (ref 0.350–4.500)

## 2020-11-01 MED ORDER — INSULIN ASPART 100 UNIT/ML ~~LOC~~ SOLN: 0.0000 [IU] | Freq: Three times a day (TID) | SUBCUTANEOUS | Status: DC

## 2020-11-01 MED ORDER — INSULIN ASPART 100 UNIT/ML ~~LOC~~ SOLN: 0.0000 [IU] | Freq: Every day | SUBCUTANEOUS | Status: DC

## 2020-11-01 MED ORDER — PANTOPRAZOLE SODIUM 40 MG PO TBEC
40.0000 mg | DELAYED_RELEASE_TABLET | Freq: Two times a day (BID) | ORAL | Status: DC
  Administered 2020-11-01 – 2020-11-06 (×10): 40 mg via ORAL
  Filled 2020-11-01 (×10): qty 1

## 2020-11-01 MED ORDER — AMLODIPINE BESYLATE 10 MG PO TABS
10.0000 mg | ORAL_TABLET | Freq: Every day | ORAL | Status: DC
  Administered 2020-11-01 – 2020-11-06 (×5): 10 mg via ORAL
  Filled 2020-11-01 (×6): qty 1

## 2020-11-01 MED ORDER — TORSEMIDE 100 MG PO TABS
100.0000 mg | ORAL_TABLET | ORAL | Status: DC
  Administered 2020-11-01 – 2020-11-05 (×3): 100 mg via ORAL
  Filled 2020-11-01: qty 5
  Filled 2020-11-01 (×2): qty 1

## 2020-11-01 MED ORDER — NICARDIPINE HCL IN NACL 20-0.86 MG/200ML-% IV SOLN
3.0000 mg/h | INTRAVENOUS | Status: DC
  Administered 2020-11-01: 10 mg/h via INTRAVENOUS
  Administered 2020-11-01: 5 mg/h via INTRAVENOUS
  Filled 2020-11-01 (×5): qty 200

## 2020-11-01 MED ORDER — CLONIDINE HCL 0.1 MG PO TABS
0.2000 mg | ORAL_TABLET | Freq: Three times a day (TID) | ORAL | Status: DC
  Administered 2020-11-01 – 2020-11-06 (×14): 0.2 mg via ORAL
  Filled 2020-11-01 (×15): qty 2

## 2020-11-01 MED ORDER — HYDRALAZINE HCL 50 MG PO TABS
50.0000 mg | ORAL_TABLET | Freq: Four times a day (QID) | ORAL | Status: DC
  Administered 2020-11-01 – 2020-11-06 (×11): 50 mg via ORAL
  Filled 2020-11-01 (×11): qty 1

## 2020-11-01 MED ORDER — IRBESARTAN 150 MG PO TABS
300.0000 mg | ORAL_TABLET | Freq: Every day | ORAL | Status: DC
  Administered 2020-11-01 – 2020-11-06 (×5): 300 mg via ORAL
  Filled 2020-11-01 (×7): qty 2

## 2020-11-01 MED ORDER — INSULIN ASPART 100 UNIT/ML ~~LOC~~ SOLN
2.0000 [IU] | Freq: Three times a day (TID) | SUBCUTANEOUS | Status: DC
  Administered 2020-11-01 – 2020-11-06 (×10): 2 [IU] via SUBCUTANEOUS
  Filled 2020-11-01 (×12): qty 1

## 2020-11-01 NOTE — Progress Notes (Signed)
PHARMACIST - PHYSICIAN COMMUNICATION   CONCERNING: IV to Oral Route Change Policy  RECOMMENDATION: This patient is receiving pantoprazole by the intravenous route.  Based on criteria approved by the Pharmacy and Therapeutics Committee, the intravenous medication(s) is/are being converted to the equivalent oral dose form(s).   DESCRIPTION: These criteria include:  The patient is eating (either orally or via tube) and/or has been taking other orally administered medications for a least 24 hours  The patient has no evidence of active gastrointestinal bleeding or impaired GI absorption (gastrectomy, short bowel, patient on TNA or NPO).  If you have questions about this conversion, please contact the Pharmacy Department  [x]   [phone] )  [place], [facility] 11/01/2020 9:05 AM

## 2020-11-01 NOTE — Progress Notes (Signed)
[place] [facility] [place] NAME: Renee [name]    MR#:  [id number]  DATE OF BIRTH:  August 12, 1976  SUBJECTIVE:  CHIEF COMPLAINT:   Chief Complaint  Patient presents with  . Shortness of Breath  Still short of breath but improved from before.  Blood pressure improving. REVIEW OF SYSTEMS:  Review of Systems  Constitutional: Negative for diaphoresis, fever, malaise/fatigue and weight loss.  HENT: Negative for ear discharge, ear pain, hearing loss, nosebleeds, sore throat and tinnitus.   Eyes: Negative for blurred vision and pain.  Respiratory: Positive for shortness of breath. Negative for cough, hemoptysis and wheezing.   Cardiovascular: Negative for chest pain, palpitations, orthopnea and leg swelling.  Gastrointestinal: Negative for abdominal pain, blood in stool, constipation, diarrhea, heartburn, nausea and vomiting.  Genitourinary: Negative for dysuria, frequency and urgency.  Musculoskeletal: Negative for back pain and myalgias.  Skin: Negative for itching and rash.  Neurological: Negative for dizziness, tingling, tremors, focal weakness, seizures, weakness and headaches.  Psychiatric/Behavioral: Negative for depression. The patient is not nervous/anxious.    DRUG ALLERGIES:  No Known Allergies VITALS:  Blood pressure (!) 168/82, pulse 91, temperature 98.5 F (36.9 C), temperature source Oral, resp. rate 20, height 5\' 5"  (1.651 m), weight 122 kg, SpO2 96 %. PHYSICAL EXAMINATION:  Physical Exam Constitutional:      Appearance: She is obese.  HENT:     Head: Normocephalic and atraumatic.  Eyes:     Conjunctiva/sclera: Conjunctivae normal.     Pupils: Pupils are equal, round, and reactive to light.  Neck:     Thyroid: No thyromegaly.     Trachea: No tracheal deviation.  Cardiovascular:     Rate and Rhythm: Normal rate and regular rhythm.     Heart sounds: Normal heart sounds.  Pulmonary:     Effort: Pulmonary effort is normal. No respiratory  distress.     Breath sounds: Decreased breath sounds present. No wheezing.  Chest:     Chest wall: No tenderness.  Abdominal:     General: Bowel sounds are normal. There is no distension.     Palpations: Abdomen is soft.     Tenderness: There is no abdominal tenderness.  Musculoskeletal:        General: Normal range of motion.     Cervical back: Normal range of motion and neck supple.     Right lower leg: 2+ Pitting Edema present.     Left lower leg: 2+ Pitting Edema present.  Skin:    General: Skin is warm and dry.     Findings: No rash.  Neurological:     Mental Status: She is alert and oriented to person, place, and time.     Cranial Nerves: No cranial nerve deficit.   Dialysis access: Right IJ permacath LABORATORY PANEL:  Female CBC Recent Labs  Lab 10/31/20 2326  WBC 9.1  HGB 8.8*  HCT 30.1*  PLT 228   ------------------------------------------------------------------------------------------------------------------ Chemistries  Recent Labs  Lab 10/31/20 1229  NA 143  K 3.5  CL 105  CO2 24  GLUCOSE 126*  BUN 49*  CREATININE 7.68*  CALCIUM 9.0  AST 26  ALT 29  ALKPHOS 142*  BILITOT 0.9   RADIOLOGY:  DG Chest 1 View  Result Date: 10/31/2020 CLINICAL DATA:  Shortness of breath. EXAM: CHEST  1 VIEW COMPARISON:  October 20, 2020. FINDINGS: Stable cardiomegaly. No pneumothorax is noted. Right internal jugular dialysis catheter is noted. Mild  bibasilar atelectasis is noted with small pleural effusions. Bony thorax is unremarkable. IMPRESSION: Mild bibasilar subsegmental atelectasis with small pleural effusions. Electronically Signed   By: Marijo Conception M.D.   On: 10/31/2020 13:34   ASSESSMENT AND PLAN:  44 year old female with a known history of CKD stage V on dialysis, hypertension, diabetes is admitted for worsening shortness of breath.  Acute on chronic kidney disease stage V Patient was recently discharged last week and was started on hemodialysis on  10/25 She was dialyzed again yesterday Plan is to continue MWF schedule for dialysis per nephrology  Hypertensive urgency Was started on nicardipine drip for blood pressure of 222/124 for which she required stepdown We will wean her off nicardipine in resume her home blood pressure medicine and adjust as needed  Anemia of chronic kidney disease Hemoglobin is 8.8.  No obvious bleeding  Type 2 diabetes with CKD 5 Lantus 15 units subcu daily Consult diabetic nurse, last hemoglobin A1c was 5.9  Morbid obesity Body mass index is 44.76 kg/m.    Wean off nicardipine drip and she is okay to get transfer out of the stepdown.  I have placed order for transfer  Status is: Inpatient  Remains inpatient appropriate because:IV treatments appropriate due to intensity of illness or inability to take PO   Dispo: The patient is from: Home              Anticipated d/c is to: Home              Anticipated d/c date is: 2 days              Patient currently is not medically stable to d/c.  Needs a better blood pressure control, ongoing dialysis and improvement in her breathing   DVT prophylaxis:       heparin injection 5,000 Units Start: 10/31/20 2200 SCDs Start: 10/31/20 1501 SCDs Start: 10/31/20 1426     Family Communication: discussed with patient and updated daughter over phone at [phone]   All the records are reviewed and case discussed with Care Management/Social Worker. Management plans discussed with the patient, family/daughter and they are in agreement.  CODE STATUS: Full Code  TOTAL TIME TAKING CARE OF THIS PATIENT: 35 minutes.   More than 50% of the time was spent in counseling/coordination of care: YES  POSSIBLE D/C IN 2-3 DAYS, DEPENDING ON CLINICAL CONDITION.   Max Sane M.D on 11/01/2020 at 11:13 AM  [facility]   CC: Primary care physician; Minda Ditto, MD  Note: This dictation was prepared with Dragon dictation along with smaller phrase  technology. Any transcriptional errors that result from this process are unintentional.

## 2020-11-01 NOTE — Progress Notes (Signed)
Pt transferred from ICU, room 17 and arrived to unit via w/c, family member and nurse from ICU, respirations even and non-labored, O2 infusing at 2LPM via N/C, NAD noted - shift assessment completed - call bell within reach - will continue to monitor.

## 2020-11-01 NOTE — Progress Notes (Signed)
*  PRELIMINARY RESULTS* Echocardiogram 2D Echocardiogram has been performed.  Renee [name] 11/01/2020, 9:10 AM

## 2020-11-01 NOTE — Progress Notes (Signed)
Handoff report received from [place], [place] in ICU.

## 2020-11-01 NOTE — Progress Notes (Signed)
Report given to RN Mordecai Maes in ICU. Patient transferred to ICU with RN and Nurse tech. Patient showing no signs of acute distress.

## 2020-11-01 NOTE — Progress Notes (Signed)
   11/01/20 0032  Assess: MEWS Score  BP (!) 245/128  Pulse Rate 93  Assess: MEWS Score  MEWS Temp 0  MEWS Systolic 2  MEWS Pulse 0  MEWS RR 0  MEWS LOC 0  MEWS Score 2  MEWS Score Color Yellow   Spoke with NP on phone. Patient will be transferred to step-down unit in order to be put in Nicardipine drip for BP. Nurse at bedside with patient.

## 2020-11-01 NOTE — Progress Notes (Signed)
Pt transferred to room 207 on the wheelchair, pt has no ss of SOB or labored breathing, RN met [place] in the room, assisted pt to walk to the bathroom without an distress. Pt placed on 2 l [place] for comfort as requested. Family member at bedside during transport.

## 2020-11-01 NOTE — Progress Notes (Signed)
[facility]  ROUNDING NOTE   Subjective:   Hemodialysis treatment yesterday. Tolerated treatment. UF of 3 liters.   Remains on nicardipine gtt.  Patient complains of headache  Objective:  Vital signs in last 24 hours:  Temp:  [97.7 F (36.5 C)-98.8 F (37.1 C)] 98.5 F (36.9 C) (11/02 0800) Pulse Rate:  [76-96] 91 (11/02 0600) Resp:  [15-27] 20 (11/02 0600) BP: (161-245)/(82-128) 168/82 (11/02 0600) SpO2:  [87 %-100 %] 96 % (11/02 0600) Weight:  [122 kg] 122 kg (11/01 1218)  Weight change:  Filed Weights   10/31/20 1218  Weight: 122 kg    Intake/Output: I/O last 3 completed shifts: In: -  Out: 3200 [Urine:200; Other:3000]   Intake/Output this shift:  No intake/output data recorded.  Physical Exam: General:  In no acute distress  Head:  Oral mucous membranes moist  Eyes: PERRL  Lungs:   bilateral crackles, diminished at bases  Heart:  Regular rate and rhythm  Abdomen:   Nondistended  Extremities:  2+ peripheral edema  Neurologic:  Oriented x3  Skin: No acute lesions or rashes  Access:  Rt IJ Permcath    Basic Metabolic Panel: Recent Labs  Lab 10/26/20 0616 10/26/20 1119 10/27/20 0346 10/31/20 1229  NA 137  --  139 143  K 3.3*  --  3.5 3.5  CL 101  --  104 105  CO2 22  --  24 24  GLUCOSE 127*  --  106* 126*  BUN 85*  --  51* 49*  CREATININE 9.34*  --  6.65* 7.68*  CALCIUM 7.3*  --  7.4* 9.0  PHOS 6.2* 6.6* 5.4*  --     Liver Function Tests: Recent Labs  Lab 10/26/20 0616 10/27/20 0346 10/31/20 1229  AST  --   --  26  ALT  --   --  29  ALKPHOS  --   --  142*  BILITOT  --   --  0.9  PROT  --   --  7.8  ALBUMIN 2.7* 2.8* 3.8   No results for input(s): LIPASE, AMYLASE in the last 168 hours. No results for input(s): AMMONIA in the last 168 hours.  CBC: Recent Labs  Lab 10/26/20 0616 10/27/20 0346 10/31/20 1229 10/31/20 2326  WBC 8.9 8.6 11.3* 9.1  NEUTROABS  --   --  9.8*  --   HGB 7.8* 7.8* 9.2* 8.8*  HCT 25.5* 25.4*  31.2* 30.1*  MCV 76.6* 78.4* 80.6 80.5  PLT 258 261 266 228    Cardiac Enzymes: No results for input(s): CKTOTAL, CKMB, CKMBINDEX, TROPONINI in the last 168 hours.  BNP: Invalid input(s): POCBNP  CBG: Recent Labs  Lab 10/26/20 1654 10/26/20 2131 10/27/20 0802 10/27/20 1156 11/01/20 0741  GLUCAP 83 124* 106* 194* 110*    Microbiology: Results for orders placed or performed during the hospital encounter of 10/31/20  Respiratory Panel by RT PCR (Flu A&B, Covid) - Nasopharyngeal Swab     Status: None   Collection Time: 10/31/20  1:02 PM   Specimen: Nasopharyngeal Swab  Result Value Ref Range Status   SARS Coronavirus 2 by RT PCR NEGATIVE NEGATIVE Final    Comment: (NOTE) SARS-CoV-2 target nucleic acids are NOT DETECTED.  The SARS-CoV-2 RNA is generally detectable in upper respiratoy specimens during the acute phase of infection. The lowest concentration of SARS-CoV-2 viral copies this assay can detect is 131 copies/mL. A negative result does not preclude SARS-Cov-2 infection and should not be used as the sole  basis for treatment or other patient management decisions. A negative result may occur with  improper specimen collection/handling, submission of specimen other than nasopharyngeal swab, presence of viral mutation(s) within the areas targeted by this assay, and inadequate number of viral copies (<131 copies/mL). A negative result must be combined with clinical observations, patient history, and epidemiological information. The expected result is Negative.  Fact Sheet for Patients:  PinkCheek.be  Fact Sheet for Healthcare Providers:  GravelBags.it  This test is no t yet approved or cleared by the [place] FDA and  has been authorized for detection and/or diagnosis of SARS-CoV-2 by FDA under an Emergency Use Authorization (EUA). This EUA will remain  in effect (meaning this test can be used) for the  duration of the COVID-19 declaration under Section 564(b)(1) of the Act, 21 U.S.C. section 360bbb-3(b)(1), unless the authorization is terminated or revoked sooner.     Influenza A by PCR NEGATIVE NEGATIVE Final   Influenza B by PCR NEGATIVE NEGATIVE Final    Comment: (NOTE) The Xpert Xpress SARS-CoV-2/FLU/RSV assay is intended as an aid in  the diagnosis of influenza from Nasopharyngeal swab specimens and  should not be used as a sole basis for treatment. Nasal washings and  aspirates are unacceptable for Xpert Xpress SARS-CoV-2/FLU/RSV  testing.  Fact Sheet for Patients: PinkCheek.be  Fact Sheet for Healthcare Providers: GravelBags.it  This test is not yet approved or cleared by the [place] FDA and  has been authorized for detection and/or diagnosis of SARS-CoV-2 by  FDA under an Emergency Use Authorization (EUA). This EUA will remain  in effect (meaning this test can be used) for the duration of the  Covid-19 declaration under Section 564(b)(1) of the Act, 21  U.S.C. section 360bbb-3(b)(1), unless the authorization is  terminated or revoked. Performed at [facility], [address] [place], [place] [address]     Coagulation Studies: No results for input(s): LABPROT, INR in the last 72 hours.  Urinalysis: No results for input(s): COLORURINE, LABSPEC, PHURINE, GLUCOSEU, HGBUR, BILIRUBINUR, KETONESUR, PROTEINUR, UROBILINOGEN, NITRITE, LEUKOCYTESUR in the last 72 hours.  Invalid input(s): APPERANCEUR    Imaging: DG Chest 1 View  Result Date: 10/31/2020 CLINICAL DATA:  Shortness of breath. EXAM: CHEST  1 VIEW COMPARISON:  October 20, 2020. FINDINGS: Stable cardiomegaly. No pneumothorax is noted. Right internal jugular dialysis catheter is noted. Mild bibasilar atelectasis is noted with small pleural effusions. Bony thorax is unremarkable. IMPRESSION: Mild bibasilar subsegmental atelectasis with small  pleural effusions. Electronically Signed   By: Marijo Conception M.D.   On: 10/31/2020 13:34     Medications:   . sodium chloride    . sodium chloride    . sodium chloride     . amLODipine  10 mg Oral Daily  . aspirin EC  81 mg Oral Daily  . atorvastatin  10 mg Oral Daily  . calcium carbonate  1,250 mg Oral BID WC  . Chlorhexidine Gluconate Cloth  6 each Topical Q0600  . cloNIDine  0.2 mg Oral TID  . heparin  5,000 Units Subcutaneous Q8H  . hydrALAZINE  50 mg Oral Q6H  . insulin glargine  15 Units Subcutaneous Daily  . irbesartan  300 mg Oral Daily  . isosorbide mononitrate  30 mg Oral Daily  . pantoprazole (PROTONIX) IV  40 mg Intravenous Q12H  . sodium chloride flush  3 mL Intravenous Q12H  . torsemide  100 mg Oral Once per day on Tue Thu Sat  . [START  ON 11/06/2020] Vitamin D (Ergocalciferol)  50,000 Units Oral Q7 days   sodium chloride, sodium chloride, sodium chloride, acetaminophen **OR** acetaminophen, alteplase, calcium carbonate, camphor-menthol **AND** hydrOXYzine, docusate sodium, feeding supplement (NEPRO CARB STEADY), heparin, lidocaine (PF), lidocaine-prilocaine, nitroGLYCERIN, pentafluoroprop-tetrafluoroeth, sodium chloride flush, sorbitol, zolpidem  Assessment/ Plan  [name] is a 44 y.o. black female with end stage renal disease on hemodialysis, hypertension, insulin dependent diabetes mellitus, who is admitted to [facility] on 10/31/2020 for Hypertensive urgency [I16.0] Elevated brain natriuretic peptide (BNP) level [R79.89] Acute respiratory failure with hypoxia ([place]) [J96.01] Troponin I above reference range [R77.8] Hypervolemia, unspecified hypervolemia type [E87.70]  [facility] MWF [facility] RIJ permcath 115kg  1. End Stage Renal Disease: hemodialysis treatment yesterday. With volume overload.  - Plan on hemodialysis treatment tomorrow and continue MWF schedule. Continue challenge ultrafiltration.   2. Hypertension: with hypertensive emergency on  admission.  On nicardipine gtt.  Home regimen of amlodipine, clonidine, hydralazine, isosorbide mononitrate.  - Start irbesartan 300mg  daily (losartan gave a cough) - start torsemide 100mg  on nondialysis days.  - if further control required, will consider increasing dose of isosorbide mononitrate   3. Anemia with chronic kidney disease: hemoglobin 8.8. Normocytic. With iron deficiency.  - EPO when blood pressure allows.  - IV iron as outpatient.   4. Secondary Hyperparathyroidism: PTH 335 on 10/23. Calcium and phosphorus are currently at goal.   Lavonia Dana, MD [facility]  11/2/202111:17 AM

## 2020-11-01 NOTE — Progress Notes (Signed)
   11/01/20 0003  Vitals  Temp 97.7 F (36.5 C)  Temp Source Oral  BP (!) 220/110  MAP (mmHg) 140  BP Location Right Arm  BP Method Automatic  Patient Position (if appropriate) Sitting  Pulse Rate 89  Pulse Rate Source Monitor  Resp 18  Level of Consciousness  Level of Consciousness Alert  MEWS COLOR  MEWS Score Color Yellow  Oxygen Therapy  SpO2 (!) 87 %  O2 Device Room Air  Patient Activity (if Appropriate) In bed  MEWS Score  MEWS Temp 0  MEWS Systolic 2  MEWS Pulse 0  MEWS RR 0  MEWS LOC 0  MEWS Score 2   Patient MEWS changed to yellow due to patient elevated BP. Charge nurse made aware and NP made aware vie secure chat. Patient put back on her nasal cannula at 2L. Patient c/o blurred vision, which is not new onset. Patient displays no signs of acute distress at this time. Nurse at bedside with patient.

## 2020-11-01 NOTE — Progress Notes (Signed)
Inpatient Diabetes Program Recommendations  AACE/ADA: New Consensus Statement on Inpatient Glycemic Control   Target Ranges:  Prepandial:   less than 140 mg/dL      Peak postprandial:   less than 180 mg/dL (1-2 hours)      Critically ill patients:  140 - 180 mg/dL   Results for Renee [name], Renee [name] (MRN [id number]) as of 11/01/2020 12:47  Ref. Range 11/01/2020 07:41 11/01/2020 11:46  Glucose-Capillary Latest Ref Range: 70 - 99 mg/dL 110 (H) 127 (H)  Results for Renee [name], Renee [name] (MRN [id number]) as of 11/01/2020 12:47  Ref. Range 01/19/2020 10:56 10/21/2020 11:45  Hemoglobin A1C Latest Ref Range: 4.8 - 5.6 % 16.4 (H) 5.9 (H)   Review of Glycemic Control  Diabetes history: DM2 Outpatient Diabetes medications: Lantus 14 units BID, Novolog 9 units TID with meals Current orders for Inpatient glycemic control: Lantus 15 units daily  Inpatient Diabetes Program Recommendations:    Insulin: Please consider ordering CBGs AC&HS and Novolog 0-6 units TID with meals and Novolog 0-5 units QHS.  HbgA1C:  A1C 5.9% on 10/21/20 indicating an average glucose of 123 mg/dl over the past 2-3 months. Noted changes made to DM medications on 10/27/20 at last hospital discharge (Novolog was discontinued and Lantus was decreased to 15 units daily due to hypoglycemia).  NOTE: Noted consult for diabetes coordinator. Chart reviewed. Patient was inpatient 10/21/20-10/27/20 and per discharge summary in chart Novolog was discontinued and Lantus was decreased to 15 units daily. CBGs today 110 and 127 mg/dl and patient received Lantus 15 units today at 10:42 am.  Recommend adding very sensitive Novolog correction scale. Will follow along while inpatient.  Thanks, Barnie Alderman, RN, MSN, CDE Diabetes Coordinator Inpatient Diabetes Program [phone] (Team Pager from 8am to 5pm)

## 2020-11-02 ENCOUNTER — Inpatient Hospital Stay: Payer: Medicare Other

## 2020-11-02 ENCOUNTER — Other Ambulatory Visit (INDEPENDENT_AMBULATORY_CARE_PROVIDER_SITE_OTHER): Payer: Self-pay | Admitting: Vascular Surgery

## 2020-11-02 DIAGNOSIS — I160 Hypertensive urgency: Secondary | ICD-10-CM | POA: Diagnosis not present

## 2020-11-02 DIAGNOSIS — J9601 Acute respiratory failure with hypoxia: Secondary | ICD-10-CM

## 2020-11-02 LAB — CBC WITH DIFFERENTIAL/PLATELET
Abs Immature Granulocytes: 0.02 10*3/uL (ref 0.00–0.07)
Basophils Absolute: 0 10*3/uL (ref 0.0–0.1)
Basophils Relative: 1 %
Eosinophils Absolute: 0.2 10*3/uL (ref 0.0–0.5)
Eosinophils Relative: 3 %
HCT: 26.8 % — ABNORMAL LOW (ref 36.0–46.0)
Hemoglobin: 7.9 g/dL — ABNORMAL LOW (ref 12.0–15.0)
Immature Granulocytes: 0 %
Lymphocytes Relative: 15 %
Lymphs Abs: 0.9 10*3/uL (ref 0.7–4.0)
MCH: 24 pg — ABNORMAL LOW (ref 26.0–34.0)
MCHC: 29.5 g/dL — ABNORMAL LOW (ref 30.0–36.0)
MCV: 81.5 fL (ref 80.0–100.0)
Monocytes Absolute: 0.4 10*3/uL (ref 0.1–1.0)
Monocytes Relative: 6 %
Neutro Abs: 4.6 10*3/uL (ref 1.7–7.7)
Neutrophils Relative %: 75 %
Platelets: 188 10*3/uL (ref 150–400)
RBC: 3.29 MIL/uL — ABNORMAL LOW (ref 3.87–5.11)
RDW: 18 % — ABNORMAL HIGH (ref 11.5–15.5)
WBC: 6.1 10*3/uL (ref 4.0–10.5)
nRBC: 0 % (ref 0.0–0.2)

## 2020-11-02 LAB — BASIC METABOLIC PANEL
Anion gap: 11 (ref 5–15)
BUN: 34 mg/dL — ABNORMAL HIGH (ref 6–20)
CO2: 27 mmol/L (ref 22–32)
Calcium: 8.4 mg/dL — ABNORMAL LOW (ref 8.9–10.3)
Chloride: 101 mmol/L (ref 98–111)
Creatinine, Ser: 6.94 mg/dL — ABNORMAL HIGH (ref 0.44–1.00)
GFR, Estimated: 7 mL/min — ABNORMAL LOW (ref >60)
Glucose, Bld: 95 mg/dL (ref 70–99)
Potassium: 3.5 mmol/L (ref 3.5–5.1)
Sodium: 139 mmol/L (ref 135–145)

## 2020-11-02 LAB — RENAL FUNCTION PANEL
Albumin: 3.4 g/dL — ABNORMAL LOW (ref 3.5–5.0)
Anion gap: 11 (ref 5–15)
BUN: 29 mg/dL — ABNORMAL HIGH (ref 6–20)
CO2: 29 mmol/L (ref 22–32)
Calcium: 8.5 mg/dL — ABNORMAL LOW (ref 8.9–10.3)
Chloride: 99 mmol/L (ref 98–111)
Creatinine, Ser: 5.54 mg/dL — ABNORMAL HIGH (ref 0.44–1.00)
GFR, Estimated: 9 mL/min — ABNORMAL LOW (ref >60)
Glucose, Bld: 114 mg/dL — ABNORMAL HIGH (ref 70–99)
Phosphorus: 3.7 mg/dL (ref 2.5–4.6)
Potassium: 3.2 mmol/L — ABNORMAL LOW (ref 3.5–5.1)
Sodium: 139 mmol/L (ref 135–145)

## 2020-11-02 LAB — GLUCOSE, CAPILLARY
Glucose-Capillary: 99 mg/dL (ref 70–99)
Glucose-Capillary: 136 mg/dL — ABNORMAL HIGH (ref 70–99)
Glucose-Capillary: 101 mg/dL — ABNORMAL HIGH (ref 70–99)
Glucose-Capillary: 114 mg/dL — ABNORMAL HIGH (ref 70–99)

## 2020-11-02 LAB — CBC
HCT: 28.2 % — ABNORMAL LOW (ref 36.0–46.0)
Hemoglobin: 8.3 g/dL — ABNORMAL LOW (ref 12.0–15.0)
MCH: 23.8 pg — ABNORMAL LOW (ref 26.0–34.0)
MCHC: 29.4 g/dL — ABNORMAL LOW (ref 30.0–36.0)
MCV: 80.8 fL (ref 80.0–100.0)
Platelets: 223 10*3/uL (ref 150–400)
RBC: 3.49 MIL/uL — ABNORMAL LOW (ref 3.87–5.11)
RDW: 17.9 % — ABNORMAL HIGH (ref 11.5–15.5)
WBC: 7.5 10*3/uL (ref 4.0–10.5)
nRBC: 0 % (ref 0.0–0.2)

## 2020-11-02 LAB — SOLUBLE TRANSFERRIN RECEPTOR: Transferrin Receptor: 37.4 nmol/L — ABNORMAL HIGH (ref 12.2–27.3)

## 2020-11-02 MED ORDER — ISOSORBIDE MONONITRATE ER 30 MG PO TB24: 60.0000 mg | ORAL_TABLET | Freq: Every day | ORAL | Status: DC

## 2020-11-02 MED ORDER — HYDRALAZINE HCL 20 MG/ML IJ SOLN
10.0000 mg | INTRAMUSCULAR | Status: DC | PRN
  Filled 2020-11-02: qty 0.5

## 2020-11-02 MED ORDER — LABETALOL HCL 200 MG PO TABS
300.0000 mg | ORAL_TABLET | Freq: Two times a day (BID) | ORAL | Status: DC
  Administered 2020-11-02 – 2020-11-06 (×7): 300 mg via ORAL
  Filled 2020-11-02 (×9): qty 1

## 2020-11-02 MED ORDER — CAMPHOR-MENTHOL 0.5-0.5 % EX LOTN
1.0000 "application " | TOPICAL_LOTION | Freq: Three times a day (TID) | CUTANEOUS | Status: DC | PRN
  Filled 2020-11-02: qty 222

## 2020-11-02 MED ORDER — HYDROXYZINE HCL 25 MG PO TABS
25.0000 mg | ORAL_TABLET | Freq: Three times a day (TID) | ORAL | Status: DC | PRN
  Administered 2020-11-03: 25 mg via ORAL

## 2020-11-02 MED ORDER — ISOSORBIDE MONONITRATE ER 30 MG PO TB24
120.0000 mg | ORAL_TABLET | Freq: Every day | ORAL | Status: DC
  Administered 2020-11-03 – 2020-11-06 (×4): 120 mg via ORAL
  Filled 2020-11-02: qty 4
  Filled 2020-11-02: qty 2
  Filled 2020-11-02 (×3): qty 4
  Filled 2020-11-02: qty 2

## 2020-11-02 MED ORDER — ISOSORBIDE MONONITRATE ER 30 MG PO TB24: 30.0000 mg | ORAL_TABLET | Freq: Every day | ORAL | Status: DC

## 2020-11-02 MED ORDER — LABETALOL HCL 5 MG/ML IV SOLN
10.0000 mg | INTRAVENOUS | Status: DC | PRN
  Administered 2020-11-03 (×2): 10 mg via INTRAVENOUS
  Filled 2020-11-02 (×3): qty 4

## 2020-11-02 NOTE — Progress Notes (Signed)
[facility]  ROUNDING NOTE   Subjective:   Patient weaned off of Cardene infusion. She is resting in bed, in no acute distress. husband at bedside.Planning for dialysis today.  Objective:  Vital signs in last 24 hours:  Temp:  [97.6 F (36.4 C)-98.7 F (37.1 C)] 97.6 F (36.4 C) (11/03 1137) Pulse Rate:  [72-83] 78 (11/03 1137) Resp:  [19-20] 19 (11/03 0754) BP: (159-190)/(90-104) 190/99 (11/03 1137) SpO2:  [97 %-100 %] 97 % (11/03 1137) Weight:  [324 kg] 112 kg (11/03 0153)  Weight change: -9.962 kg Filed Weights   10/31/20 1218 11/02/20 0153  Weight: 122 kg 112 kg    Intake/Output: I/O last 3 completed shifts: In: 1299.3 [P.O.:635; I.V.:664.3] Out: 500 [Urine:500]   Intake/Output this shift:  No intake/output data recorded.  Physical Exam: General: Sitting up in bed, appears pleasant  Head: Normocephalic,Atraumatic  Eyes: Anicteric  Lungs:   Lungs diminished at bases, normal effort  Heart:   S1S2, Regular  Abdomen:   Non distended,non tender  Extremities:  2+ peripheral edema  Neurologic:  Oriented x3  Skin: No acute lesions or rashes  Access:  Rt IJ Permcath    Basic Metabolic Panel: Recent Labs  Lab 10/27/20 0346 10/31/20 1229 11/02/20 0734  NA 139 143 139  K 3.5 3.5 3.5  CL 104 105 101  CO2 24 24 27   GLUCOSE 106* 126* 95  BUN 51* 49* 34*  CREATININE 6.65* 7.68* 6.94*  CALCIUM 7.4* 9.0 8.4*  PHOS 5.4*  --   --     Liver Function Tests: Recent Labs  Lab 10/27/20 0346 10/31/20 1229  AST  --  26  ALT  --  29  ALKPHOS  --  142*  BILITOT  --  0.9  PROT  --  7.8  ALBUMIN 2.8* 3.8   No results for input(s): LIPASE, AMYLASE in the last 168 hours. No results for input(s): AMMONIA in the last 168 hours.  CBC: Recent Labs  Lab 10/27/20 0346 10/31/20 1229 10/31/20 2326 11/02/20 0734  WBC 8.6 11.3* 9.1 6.1  NEUTROABS  --  9.8*  --  4.6  HGB 7.8* 9.2* 8.8* 7.9*  HCT 25.4* 31.2* 30.1* 26.8*  MCV 78.4* 80.6 80.5 81.5  PLT 261  266 228 188    Cardiac Enzymes: No results for input(s): CKTOTAL, CKMB, CKMBINDEX, TROPONINI in the last 168 hours.  BNP: Invalid input(s): POCBNP  CBG: Recent Labs  Lab 11/01/20 1146 11/01/20 1624 11/01/20 2131 11/02/20 0742 11/02/20 1134  GLUCAP 127* 115* 109* 99 136*    Microbiology: Results for orders placed or performed during the hospital encounter of 10/31/20  Respiratory Panel by RT PCR (Flu A&B, Covid) - Nasopharyngeal Swab     Status: None   Collection Time: 10/31/20  1:02 PM   Specimen: Nasopharyngeal Swab  Result Value Ref Range Status   SARS Coronavirus 2 by RT PCR NEGATIVE NEGATIVE Final    Comment: (NOTE) SARS-CoV-2 target nucleic acids are NOT DETECTED.  The SARS-CoV-2 RNA is generally detectable in upper respiratoy specimens during the acute phase of infection. The lowest concentration of SARS-CoV-2 viral copies this assay can detect is 131 copies/mL. A negative result does not preclude SARS-Cov-2 infection and should not be used as the sole basis for treatment or other patient management decisions. A negative result may occur with  improper specimen collection/handling, submission of specimen other than nasopharyngeal swab, presence of viral mutation(s) within the areas targeted by this assay, and inadequate number of  viral copies (<131 copies/mL). A negative result must be combined with clinical observations, patient history, and epidemiological information. The expected result is Negative.  Fact Sheet for Patients:  PinkCheek.be  Fact Sheet for Healthcare Providers:  GravelBags.it  This test is no t yet approved or cleared by the [place] FDA and  has been authorized for detection and/or diagnosis of SARS-CoV-2 by FDA under an Emergency Use Authorization (EUA). This EUA will remain  in effect (meaning this test can be used) for the duration of the COVID-19 declaration under Section  564(b)(1) of the Act, 21 U.S.C. section 360bbb-3(b)(1), unless the authorization is terminated or revoked sooner.     Influenza A by PCR NEGATIVE NEGATIVE Final   Influenza B by PCR NEGATIVE NEGATIVE Final    Comment: (NOTE) The Xpert Xpress SARS-CoV-2/FLU/RSV assay is intended as an aid in  the diagnosis of influenza from Nasopharyngeal swab specimens and  should not be used as a sole basis for treatment. Nasal washings and  aspirates are unacceptable for Xpert Xpress SARS-CoV-2/FLU/RSV  testing.  Fact Sheet for Patients: PinkCheek.be  Fact Sheet for Healthcare Providers: GravelBags.it  This test is not yet approved or cleared by the [place] FDA and  has been authorized for detection and/or diagnosis of SARS-CoV-2 by  FDA under an Emergency Use Authorization (EUA). This EUA will remain  in effect (meaning this test can be used) for the duration of the  Covid-19 declaration under Section 564(b)(1) of the Act, 21  U.S.C. section 360bbb-3(b)(1), unless the authorization is  terminated or revoked. Performed at [facility], [address] [place], [place] [address]     Coagulation Studies: No results for input(s): LABPROT, INR in the last 72 hours.  Urinalysis: No results for input(s): COLORURINE, LABSPEC, PHURINE, GLUCOSEU, HGBUR, BILIRUBINUR, KETONESUR, PROTEINUR, UROBILINOGEN, NITRITE, LEUKOCYTESUR in the last 72 hours.  Invalid input(s): APPERANCEUR    Imaging: ECHOCARDIOGRAM COMPLETE  Result Date: 11/01/2020    ECHOCARDIOGRAM REPORT   Patient Name:   Renee [name] Date of Exam: 11/01/2020 Medical Rec #:  [id number]       Height:       65.0 in Accession #:    [id number]      Weight:       269.0 lb Date of Birth:  [birth year]        BSA:          2.244 m Patient Age:    44 years        BP:           168/82 mmHg Patient Gender: F               HR:           96 bpm. Exam Location:  [facility] Procedure: 2D  Echo, Color Doppler and Cardiac Doppler Indications:     I50.31 CHF-Acute Diastolic  History:         Patient has prior history of Echocardiogram examinations.                  HFpEF, CKD; Risk Factors:Hypertension and Diabetes.  Sonographer:     Charmayne Sheer RDCS (AE) Referring Phys:  [id number] Para Skeans Diagnosing Phys: Serafina Royals MD  Sonographer Comments: Suboptimal subcostal window. Image acquisition challenging due to respiratory motion. IMPRESSIONS  1. Left ventricular ejection fraction, by estimation, is 60 to 65%. The left ventricle has normal function. The left ventricle has no regional wall motion abnormalities. Left ventricular diastolic parameters were  normal.  2. Right ventricular systolic function is normal. The right ventricular size is normal.  3. The mitral valve is normal in structure. Trivial mitral valve regurgitation.  4. The aortic valve is normal in structure. Aortic valve regurgitation is not visualized. FINDINGS  Left Ventricle: Left ventricular ejection fraction, by estimation, is 60 to 65%. The left ventricle has normal function. The left ventricle has no regional wall motion abnormalities. The left ventricular internal cavity size was normal in size. There is  no left ventricular hypertrophy. Left ventricular diastolic parameters were normal. Right Ventricle: The right ventricular size is normal. No increase in right ventricular wall thickness. Right ventricular systolic function is normal. Left Atrium: Left atrial size was normal in size. Right Atrium: Right atrial size was normal in size. Pericardium: There is no evidence of pericardial effusion. Mitral Valve: The mitral valve is normal in structure. Trivial mitral valve regurgitation. MV peak gradient, 6.2 mmHg. The mean mitral valve gradient is 4.0 mmHg. Tricuspid Valve: The tricuspid valve is normal in structure. Tricuspid valve regurgitation is trivial. Aortic Valve: The aortic valve is normal in structure. Aortic valve  regurgitation is not visualized. Aortic valve mean gradient measures 4.0 mmHg. Aortic valve peak gradient measures 10.9 mmHg. Aortic valve area, by VTI measures 3.27 cm. Pulmonic Valve: The pulmonic valve was normal in structure. Pulmonic valve regurgitation is not visualized. Aorta: The aortic root and ascending aorta are structurally normal, with no evidence of dilitation. IAS/Shunts: No atrial level shunt detected by color flow Doppler.  LEFT VENTRICLE PLAX 2D LVIDd:         4.66 cm  Diastology LVIDs:         3.35 cm  LV e' medial:    5.33 cm/s LV PW:         1.56 cm  LV E/e' medial:  17.7 LV IVS:        1.26 cm  LV e' lateral:   5.55 cm/s LVOT diam:     2.20 cm  LV E/e' lateral: 17.0 LV SV:         77 LV SV Index:   34 LVOT Area:     3.80 cm  RIGHT VENTRICLE RV Basal diam:  3.70 cm LEFT ATRIUM              Index       RIGHT ATRIUM           Index LA diam:        4.90 cm  2.18 cm/m  RA Area:     22.10 cm LA Vol (A2C):   82.6 ml  36.82 ml/m RA Volume:   64.20 ml  28.62 ml/m LA Vol (A4C):   139.0 ml 61.96 ml/m LA Biplane Vol: 112.0 ml 49.92 ml/m  AORTIC VALVE                   PULMONIC VALVE AV Area (Vmax):    2.90 cm    PV Vmax:       1.06 m/s AV Area (Vmean):   3.05 cm    PV Vmean:      69.200 cm/s AV Area (VTI):     3.27 cm    PV VTI:        0.194 m AV Vmax:           165.00 cm/s PV Peak grad:  4.5 mmHg AV Vmean:          92.500 cm/s PV Mean grad:  2.0 mmHg  AV VTI:            0.236 m AV Peak Grad:      10.9 mmHg AV Mean Grad:      4.0 mmHg LVOT Vmax:         126.00 cm/s LVOT Vmean:        74.300 cm/s LVOT VTI:          0.203 m LVOT/AV VTI ratio: 0.86  AORTA Ao Root diam: 3.30 cm MITRAL VALVE                TRICUSPID VALVE MV Area (PHT): 7.16 cm     TR Peak grad:   43.0 mmHg MV Peak grad:  6.2 mmHg     TR Vmax:        328.00 cm/s MV Mean grad:  4.0 mmHg MV Vmax:       1.25 m/s     SHUNTS MV Vmean:      97.7 cm/s    Systemic VTI:  0.20 m MV Decel Time: 106 msec     Systemic Diam: 2.20 cm MV E velocity:  94.10 cm/s MV A velocity: 107.00 cm/s MV E/A ratio:  0.88 Serafina Royals MD Electronically signed by Serafina Royals MD Signature Date/Time: 11/01/2020/1:37:50 PM    Final      Medications:   . sodium chloride    . sodium chloride    . sodium chloride     . amLODipine  10 mg Oral Daily  . aspirin EC  81 mg Oral Daily  . atorvastatin  10 mg Oral Daily  . calcium carbonate  1,250 mg Oral BID WC  . Chlorhexidine Gluconate Cloth  6 each Topical Q0600  . cloNIDine  0.2 mg Oral TID  . heparin  5,000 Units Subcutaneous Q8H  . hydrALAZINE  50 mg Oral Q6H  . insulin aspart  0-5 Units Subcutaneous QHS  . insulin aspart  0-6 Units Subcutaneous TID WC  . insulin aspart  2 Units Subcutaneous TID WC  . insulin glargine  15 Units Subcutaneous Daily  . irbesartan  300 mg Oral Daily  . isosorbide mononitrate  30 mg Oral Daily  . pantoprazole  40 mg Oral BID  . sodium chloride flush  3 mL Intravenous Q12H  . torsemide  100 mg Oral Once per day on Tue Thu Sat  . [START ON 11/06/2020] Vitamin D (Ergocalciferol)  50,000 Units Oral Q7 days   sodium chloride, sodium chloride, sodium chloride, acetaminophen **OR** acetaminophen, alteplase, calcium carbonate, camphor-menthol **AND** hydrOXYzine, docusate sodium, feeding supplement (NEPRO CARB STEADY), heparin, lidocaine (PF), lidocaine-prilocaine, nitroGLYCERIN, pentafluoroprop-tetrafluoroeth, sodium chloride flush, sorbitol, zolpidem  Assessment/ Plan  [name] is a 44 y.o. black female with end stage renal disease on hemodialysis, hypertension, insulin dependent diabetes mellitus, who is admitted to [facility] on 10/31/2020 for Hypertensive urgency [I16.0] Elevated brain natriuretic peptide (BNP) level [R79.89] Acute respiratory failure with hypoxia ([place]) [J96.01] Troponin I above reference range [R77.8] Hypervolemia, unspecified hypervolemia type [E87.70]  [facility] MWF [facility] RIJ permcath 115kg  1. End Stage Renal Disease Planning for  dialysis today Vascular team,scheduled patient for AVF placement on Friday  2. Hypertension: with hypertensive emergency on admission.  Nicardipine infusion weaned off BP readings better, but stays above goal Irbesartan 300mg  started yesterday Torsemide 100mg  on nondialysis days.    3. Anemia with chronic kidney disease: Will continue monitoring CBCs Plan to restart Epogen when hypertension better controlled  4. Secondary Hyperparathyroidism Will continue monitoring bone  mineral metabolism parameters No acute indication for Phos binders  Crosby Oyster, NP [facility]  11/3/20211:57 PM

## 2020-11-02 NOTE — TOC Initial Note (Signed)
Transition of Care [facility]) - Initial/Assessment Note    Patient Details  Name: Renee [name] MRN: [id number] Date of Birth: [birth year]  Transition of Care [facility]) CM/SW Contact:    Kerin Salen, RN Phone Number: 11/02/2020, 10:31 AM  Clinical Narrative:  Re-admission prevention screen complete, Nurse Case Manager met with patient, husband at bedside however left the room. Nurse Case Manager introduced role and explain discharge planning to be discussed. Pt with new PCP at [facility] in [place], her previous provider left the practice. She has transportation to appointments, currently get medication from [place]. Reports difficulty affording medications, she has IPHONE in room. Explained good rx app. Patient also interested in switching to [place] due to lower cost of medications. Will provide patient with printed $4 list for take home. Patient reports she was not getting [facility] nor using DME prior to admission. Patient is on acute oxygen, will follow for potential home oxygen need. No further concerns. Nurse Case Manager encouraged patient to contact as needed. Nurse Case Manager will continue to follow patient for support and facilitate return home when stable.              Expected Discharge Plan: Home/Self Care Barriers to Discharge: Continued Medical Work up   Patient Goals and CMS Choice        Expected Discharge Plan and Services Expected Discharge Plan: Home/Self Care     Post Acute Care Choice: NA Living arrangements for the past 2 months: Single Family Home                                      Prior Living Arrangements/Services Living arrangements for the past 2 months: Single Family Home Lives with:: Spouse Patient language and need for interpreter reviewed:: Yes Do you feel safe going back to the place where you live?: Yes      Need for Family Participation in Patient Care: Yes (Comment) Care giver support system in place?: Yes  (comment)   Criminal Activity/Legal Involvement Pertinent to Current Situation/Hospitalization: No - Comment as needed  Activities of Daily Living Home Assistive Devices/Equipment: CBG Meter, Blood pressure cuff ADL Screening (condition at time of admission) Patient's cognitive ability adequate to safely complete daily activities?: Yes Is the patient deaf or have difficulty hearing?: No Does the patient have difficulty seeing, even when wearing glasses/contacts?: No Does the patient have difficulty concentrating, remembering, or making decisions?: No Patient able to express need for assistance with ADLs?: No Does the patient have difficulty dressing or bathing?: No Independently performs ADLs?: Yes (appropriate for developmental age) Does the patient have difficulty walking or climbing stairs?: No Weakness of Legs: None Weakness of Arms/Hands: None  Permission Sought/Granted                  Emotional Assessment Appearance:: Appears stated age Attitude/Demeanor/Rapport: Gracious, Engaged Affect (typically observed): Accepting, Appropriate, Calm, Pleasant Orientation: : Oriented to Self, Oriented to Place, Oriented to  Time, Oriented to Situation Alcohol / Substance Use: Not Applicable Psych Involvement: No (comment)  Admission diagnosis:  Hypertensive urgency [I16.0] Elevated brain natriuretic peptide (BNP) level [R79.89] Acute respiratory failure with hypoxia (HCC) [J96.01] Troponin I above reference range [R77.8] Hypervolemia, unspecified hypervolemia type [E87.70] Patient Active Problem List   Diagnosis Date Noted  . Hypertensive urgency 10/31/2020  . (HFpEF) heart failure with preserved ejection fraction ([place])   . ESRD (end stage  renal disease) on dialysis ([place]) 10/26/2020  . Acute CHF (congestive heart failure) ([place]) 10/21/2020  . ARF (acute renal failure) ([place]) 10/21/2020  . Type II diabetes mellitus with renal manifestations ([place]) 10/21/2020  . Chest pain  10/21/2020  . Elevated troponin 10/21/2020  . UTI (urinary tract infection) 10/21/2020  . Hypocalcemia 10/21/2020  . HLD (hyperlipidemia) 10/21/2020  . Anasarca associated with disorder of kidney   . Anemia   . Hyperglycemic hyperosmolar nonketotic coma ([place])   . Status epilepticus ([place]) 01/19/2020  . Acute pulmonary edema ([place]) 08/27/2019  . HTN (hypertension) 01/29/2019  . Chronic diastolic heart failure with preserved ejection fraction ([place]) 08/14/2017  . Hypertensive emergency 07/05/2017  . 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 Acute on chronic renal failure ([place]) 06/14/2017  . Hyponatremia 06/14/2017  . Hypokalemia 06/14/2017  . Leg swelling 06/14/2017  . Cardiomyopathy ([place]) 04/08/2015  . Accelerated hypertension 04/08/2015  . Diabetes mellitus, type 2 ([place]) 04/08/2015   PCP:  Minda Ditto, MD Pharmacy:   [id number] [id number]: [phone]: [phone]    Social Determinants of Health (SDOH) Interventions    Readmission Risk Interventions Readmission Risk Prevention Plan 11/02/2020 10/27/2020  Transportation Screening Complete Complete  PCP or Specialist Appt within 3-5 Days Complete -  Social Work Consult for [place]anning/Counseling Complete -  P[place]ub[place]ening Not Applicable -  Medication Review (Press photographer Complete Complete  PCP or Specialist appointment within 3-5 days of discharge - Complete  [place] or [place]- Complete  SW Recovery Care/Counseling Consult - Complete  [place]- Not Applicable  Some recent data might be hidden

## 2020-11-02 NOTE — Progress Notes (Signed)
PROGRESS NOTE    Renee [name]  YNW:[id number] DOB: 1976-05-23 DOA: 10/31/2020 PCP: Minda Ditto, MD    Brief Narrative:  44 year old female known history of CKD stage V, hypertension, diabetes admitted for worsening shortness of breath.  Patient has a recent hemodialysis started 10/24/2020.  On presentation she was markedly hypertensive requiring nicardipine infusion.  This been weaned off.  Patient transferred to general medical floor on 11/01/2020 for further management.  Blood pressure control has remained suboptimal despite multiple agents.  Per nephrology plan to dialyze today with goal ultrafiltration of 3 to 4 L.  Will defer titration of antihypertensive regimen at this time.   Assessment & Plan:   Principal Problem:   Hypertensive urgency Active Problems:   Diabetes mellitus, type 2 (HCC)   Chronic diastolic heart failure with preserved ejection fraction (HCC)   Anemia   ESRD (end stage renal disease) on dialysis ([place])  Acute on chronic kidney disease stage V Patient was recently discharged last week and was started on hemodialysis on 10/25 Nephrology following for inpatient HD needs  Hypertensive urgency Was started on nicardipine drip for blood pressure of 222/124 for which she required stepdown The Cardene drip is been weaned down the patient's blood pressure control remain suboptimal Plan: Continue current antihypertensives for now HD today, per nephrology plan to remove 3 to 4 L If BP control remains suboptimal after hemodialysis will adjust regimen  Anemia of chronic kidney disease Hemoglobin is 8.8.  No obvious bleeding  Type 2 diabetes with CKD 5 Lantus 15 units subcu daily Consult diabetic nurse, last hemoglobin A1c was 5.9  Morbid obesity Body mass index is 44.76 kg/m.    DVT prophylaxis: Subcutaneous heparin Code Status: Full Family Communication: Spouse at bedside Disposition Plan: Status is: Inpatient  Remains inpatient appropriate  because:IV treatments appropriate due to intensity of illness or inability to take PO   Dispo:  Patient From: Home  Planned Disposition: Home  Expected discharge date: 11/04/20  Medically stable for discharge: No   Still requiring inpatient evaluation management for blood pressure control.  Anticipate 24 to 48 hours more.       Consultants:   Nephrology  Procedures:   None  Antimicrobials:   None   Subjective: Patient seen and examined.  Endorses some left knee weakness and pain.  No other complaints  Objective: Vitals:   11/02/20 0153 11/02/20 0513 11/02/20 0754 11/02/20 1137  BP:  (!) 166/90 (!) 172/104 (!) 190/99  Pulse:  76 75 78  Resp:  20 19   Temp:  98.7 F (37.1 C) 98.2 F (36.8 C) 97.6 F (36.4 C)  TempSrc:  Oral Oral Oral  SpO2:  100%  97%  Weight: 112 kg     Height:        Intake/Output Summary (Last 24 hours) at 11/02/2020 1226 Last data filed at 11/01/2020 2022 Gross per 24 hour  Intake 278 ml  Output 100 ml  Net 178 ml   Filed Weights   10/31/20 1218 11/02/20 0153  Weight: 122 kg 112 kg    Examination:  General exam: Appears calm and comfortable  Respiratory system: Poor respiratory effort.  Bibasilar crackles.  2 L nasal cannula Cardiovascular system: S1 & S2 heard, RRR. No JVD, murmurs, rubs, gallops or clicks. No pedal edema. Gastrointestinal system: Abdomen is nondistended, soft and nontender. No organomegaly or masses felt. Normal bowel sounds heard. Central nervous system: Alert and oriented. No focal neurological deficits. Extremities: Symmetric 5 x 5 power. Skin:  No obvious rashes or lesions.  Right IJ PermCath Psychiatry: Judgement and insight appear normal. Mood & affect appropriate.     Data Reviewed: I have personally reviewed following labs and imaging studies  CBC: Recent Labs  Lab 10/27/20 0346 10/31/20 1229 10/31/20 2326 11/02/20 0734  WBC 8.6 11.3* 9.1 6.1  NEUTROABS  --  9.8*  --  4.6  HGB 7.8* 9.2* 8.8*  7.9*  HCT 25.4* 31.2* 30.1* 26.8*  MCV 78.4* 80.6 80.5 81.5  PLT 261 266 228 476   Basic Metabolic Panel: Recent Labs  Lab 10/27/20 0346 10/31/20 1229 11/02/20 0734  NA 139 143 139  K 3.5 3.5 3.5  CL 104 105 101  CO2 24 24 27   GLUCOSE 106* 126* 95  BUN 51* 49* 34*  CREATININE 6.65* 7.68* 6.94*  CALCIUM 7.4* 9.0 8.4*  PHOS 5.4*  --   --    GFR: Estimated Creatinine Clearance: 12.8 mL/min (A) (by C-G formula based on SCr of 6.94 mg/dL (H)). Liver Function Tests: Recent Labs  Lab 10/27/20 0346 10/31/20 1229  AST  --  26  ALT  --  29  ALKPHOS  --  142*  BILITOT  --  0.9  PROT  --  7.8  ALBUMIN 2.8* 3.8   No results for input(s): LIPASE, AMYLASE in the last 168 hours. No results for input(s): AMMONIA in the last 168 hours. Coagulation Profile: No results for input(s): INR, PROTIME in the last 168 hours. Cardiac Enzymes: No results for input(s): CKTOTAL, CKMB, CKMBINDEX, TROPONINI in the last 168 hours. BNP (last 3 results) No results for input(s): PROBNP in the last 8760 hours. HbA1C: No results for input(s): HGBA1C in the last 72 hours. CBG: Recent Labs  Lab 11/01/20 1146 11/01/20 1624 11/01/20 2131 11/02/20 0742 11/02/20 1134  GLUCAP 127* 115* 109* 99 136*   Lipid Profile: No results for input(s): CHOL, HDL, LDLCALC, TRIG, CHOLHDL, LDLDIRECT in the last 72 hours. Thyroid Function Tests: Recent Labs    10/31/20 2326  TSH 4.538*  FREET4 1.71*   Anemia Panel: Recent Labs    10/31/20 2326  VITAMINB12 878  FOLATE 7.8  FERRITIN 194  TIBC 340  IRON 46  RETICCTPCT 2.3   Sepsis Labs: No results for input(s): PROCALCITON, LATICACIDVEN in the last 168 hours.  Recent Results (from the past 240 hour(s))  Respiratory Panel by RT PCR (Flu A&B, Covid) - Nasopharyngeal Swab     Status: None   Collection Time: 10/31/20  1:02 PM   Specimen: Nasopharyngeal Swab  Result Value Ref Range Status   SARS Coronavirus 2 by RT PCR NEGATIVE NEGATIVE Final     Comment: (NOTE) SARS-CoV-2 target nucleic acids are NOT DETECTED.  The SARS-CoV-2 RNA is generally detectable in upper respiratoy specimens during the acute phase of infection. The lowest concentration of SARS-CoV-2 viral copies this assay can detect is 131 copies/mL. A negative result does not preclude SARS-Cov-2 infection and should not be used as the sole basis for treatment or other patient management decisions. A negative result may occur with  improper specimen collection/handling, submission of specimen other than nasopharyngeal swab, presence of viral mutation(s) within the areas targeted by this assay, and inadequate number of viral copies (<131 copies/mL). A negative result must be combined with clinical observations, patient history, and epidemiological information. The expected result is Negative.  Fact Sheet for Patients:  PinkCheek.be  Fact Sheet for Healthcare Providers:  GravelBags.it  This test is no t yet approved or cleared by the  [place] Architectural technologist and  has been authorized for detection and/or diagnosis of SARS-CoV-2 by FDA under an Print production planner (EUA). This EUA will remain  in effect (meaning this test can be used) for the duration of the COVID-19 declaration under Section 564(b)(1) of the Act, 21 U.S.C. section 360bbb-3(b)(1), unless the authorization is terminated or revoked sooner.     Influenza A by PCR NEGATIVE NEGATIVE Final   Influenza B by PCR NEGATIVE NEGATIVE Final    Comment: (NOTE) The Xpert Xpress SARS-CoV-2/FLU/RSV assay is intended as an aid in  the diagnosis of influenza from Nasopharyngeal swab specimens and  should not be used as a sole basis for treatment. Nasal washings and  aspirates are unacceptable for Xpert Xpress SARS-CoV-2/FLU/RSV  testing.  Fact Sheet for Patients: PinkCheek.be  Fact Sheet for Healthcare  Providers: GravelBags.it  This test is not yet approved or cleared by the [place] FDA and  has been authorized for detection and/or diagnosis of SARS-CoV-2 by  FDA under an Emergency Use Authorization (EUA). This EUA will remain  in effect (meaning this test can be used) for the duration of the  Covid-19 declaration under Section 564(b)(1) of the Act, 21  U.S.C. section 360bbb-3(b)(1), unless the authorization is  terminated or revoked. Performed at [facility], [address] [place], [place] [address]          Radiology Studies: DG Chest 1 View  Result Date: 10/31/2020 CLINICAL DATA:  Shortness of breath. EXAM: CHEST  1 VIEW COMPARISON:  October 20, 2020. FINDINGS: Stable cardiomegaly. No pneumothorax is noted. Right internal jugular dialysis catheter is noted. Mild bibasilar atelectasis is noted with small pleural effusions. Bony thorax is unremarkable. IMPRESSION: Mild bibasilar subsegmental atelectasis with small pleural effusions. Electronically Signed   By: Marijo Conception M.D.   On: 10/31/2020 13:34   ECHOCARDIOGRAM COMPLETE  Result Date: 11/01/2020    ECHOCARDIOGRAM REPORT   Patient Name:   MADDYX VALLIE Date of Exam: 11/01/2020 Medical Rec #:  [id number]       Height:       65.0 in Accession #:    [id number]      Weight:       269.0 lb Date of Birth:  08/30/1976        BSA:          2.244 m Patient Age:    60 years        BP:           168/82 mmHg Patient Gender: F               HR:           96 bpm. Exam Location:  [facility] Procedure: 2D Echo, Color Doppler and Cardiac Doppler Indications:     I50.31 CHF-Acute Diastolic  History:         Patient has prior history of Echocardiogram examinations.                  HFpEF, CKD; Risk Factors:Hypertension and Diabetes.  Sonographer:     Charmayne Sheer RDCS (AE) Referring Phys:  [id number] Para Skeans Diagnosing Phys: Serafina Royals MD  Sonographer Comments: Suboptimal subcostal window. Image acquisition  challenging due to respiratory motion. IMPRESSIONS  1. Left ventricular ejection fraction, by estimation, is 60 to 65%. The left ventricle has normal function. The left ventricle has no regional wall motion abnormalities. Left ventricular diastolic parameters were normal.  2. Right ventricular systolic function is normal.  The right ventricular size is normal.  3. The mitral valve is normal in structure. Trivial mitral valve regurgitation.  4. The aortic valve is normal in structure. Aortic valve regurgitation is not visualized. FINDINGS  Left Ventricle: Left ventricular ejection fraction, by estimation, is 60 to 65%. The left ventricle has normal function. The left ventricle has no regional wall motion abnormalities. The left ventricular internal cavity size was normal in size. There is  no left ventricular hypertrophy. Left ventricular diastolic parameters were normal. Right Ventricle: The right ventricular size is normal. No increase in right ventricular wall thickness. Right ventricular systolic function is normal. Left Atrium: Left atrial size was normal in size. Right Atrium: Right atrial size was normal in size. Pericardium: There is no evidence of pericardial effusion. Mitral Valve: The mitral valve is normal in structure. Trivial mitral valve regurgitation. MV peak gradient, 6.2 mmHg. The mean mitral valve gradient is 4.0 mmHg. Tricuspid Valve: The tricuspid valve is normal in structure. Tricuspid valve regurgitation is trivial. Aortic Valve: The aortic valve is normal in structure. Aortic valve regurgitation is not visualized. Aortic valve mean gradient measures 4.0 mmHg. Aortic valve peak gradient measures 10.9 mmHg. Aortic valve area, by VTI measures 3.27 cm. Pulmonic Valve: The pulmonic valve was normal in structure. Pulmonic valve regurgitation is not visualized. Aorta: The aortic root and ascending aorta are structurally normal, with no evidence of dilitation. IAS/Shunts: No atrial level shunt  detected by color flow Doppler.  LEFT VENTRICLE PLAX 2D LVIDd:         4.66 cm  Diastology LVIDs:         3.35 cm  LV e' medial:    5.33 cm/s LV PW:         1.56 cm  LV E/e' medial:  17.7 LV IVS:        1.26 cm  LV e' lateral:   5.55 cm/s LVOT diam:     2.20 cm  LV E/e' lateral: 17.0 LV SV:         77 LV SV Index:   34 LVOT Area:     3.80 cm  RIGHT VENTRICLE RV Basal diam:  3.70 cm LEFT ATRIUM              Index       RIGHT ATRIUM           Index LA diam:        4.90 cm  2.18 cm/m  RA Area:     22.10 cm LA Vol (A2C):   82.6 ml  36.82 ml/m RA Volume:   64.20 ml  28.62 ml/m LA Vol (A4C):   139.0 ml 61.96 ml/m LA Biplane Vol: 112.0 ml 49.92 ml/m  AORTIC VALVE                   PULMONIC VALVE AV Area (Vmax):    2.90 cm    PV Vmax:       1.06 m/s AV Area (Vmean):   3.05 cm    PV Vmean:      69.200 cm/s AV Area (VTI):     3.27 cm    PV VTI:        0.194 m AV Vmax:           165.00 cm/s PV Peak grad:  4.5 mmHg AV Vmean:          92.500 cm/s PV Mean grad:  2.0 mmHg AV VTI:  0.236 m AV Peak Grad:      10.9 mmHg AV Mean Grad:      4.0 mmHg LVOT Vmax:         126.00 cm/s LVOT Vmean:        74.300 cm/s LVOT VTI:          0.203 m LVOT/AV VTI ratio: 0.86  AORTA Ao Root diam: 3.30 cm MITRAL VALVE                TRICUSPID VALVE MV Area (PHT): 7.16 cm     TR Peak grad:   43.0 mmHg MV Peak grad:  6.2 mmHg     TR Vmax:        328.00 cm/s MV Mean grad:  4.0 mmHg MV Vmax:       1.25 m/s     SHUNTS MV Vmean:      97.7 cm/s    Systemic VTI:  0.20 m MV Decel Time: 106 msec     Systemic Diam: 2.20 cm MV E velocity: 94.10 cm/s MV A velocity: 107.00 cm/s MV E/A ratio:  0.88 Serafina Royals MD Electronically signed by Serafina Royals MD Signature Date/Time: 11/01/2020/1:37:50 PM    Final         Scheduled Meds: . amLODipine  10 mg Oral Daily  . aspirin EC  81 mg Oral Daily  . atorvastatin  10 mg Oral Daily  . calcium carbonate  1,250 mg Oral BID WC  . Chlorhexidine Gluconate Cloth  6 each Topical Q0600  .  cloNIDine  0.2 mg Oral TID  . heparin  5,000 Units Subcutaneous Q8H  . hydrALAZINE  50 mg Oral Q6H  . insulin aspart  0-5 Units Subcutaneous QHS  . insulin aspart  0-6 Units Subcutaneous TID WC  . insulin aspart  2 Units Subcutaneous TID WC  . insulin glargine  15 Units Subcutaneous Daily  . irbesartan  300 mg Oral Daily  . isosorbide mononitrate  30 mg Oral Daily  . pantoprazole  40 mg Oral BID  . sodium chloride flush  3 mL Intravenous Q12H  . torsemide  100 mg Oral Once per day on Tue Thu Sat  . [START ON 11/06/2020] Vitamin D (Ergocalciferol)  50,000 Units Oral Q7 days   Continuous Infusions: . sodium chloride    . sodium chloride    . sodium chloride       LOS: 2 days    Time spent: 25 minutes    Sidney Ace, MD [facility] Pager [phone]  If 7PM-7AM, please contact night-coverage 11/02/2020, 12:26 PM

## 2020-11-02 NOTE — Progress Notes (Signed)
Mobility Specialist - Progress Note   11/02/20 1200  Mobility  Activity Stood at bedside  Range of Motion/Exercises Right leg;Left leg (hip abd, lunges, knee flexion, standing march, hip ext)  Level of Assistance Standby assist, set-up cues, supervision of patient - no hands on  Assistive Device Front wheel walker  Distance Ambulated (ft) 0 ft  Mobility Response Tolerated well  Mobility performed by Mobility specialist  $Mobility charge 1 Mobility    Pre-mobility: 80 HR, 97% SpO2 During mobility: 103 HR, 96% SpO2 Post-mobility: 93 HR, 96% SpO2   Pt was sitting in bed upon arrival with s/o present in room. Pt agreed to session. Pt initally denied any pain, nausea, or fatigue. Pt was able to get EOB with SBA utilizing bed rails. Pt stood to RW with SBA where she performed exercises: hip abduction, knee flexion, hip extension, lunges, and march. Pt attempted to march in place without AD, noted LOBx1 in attempt. Pt was instructed to utilize RW for balance and safety. Pt was able to pivot left and right with RW. Pt needed several short breaks during activity d/t fatigue. Overall, pt tolerated session well. Pt was left EOB with all needs in reach.    Kathee Delton Mobility Specialist 11/02/20, 12:22 PM

## 2020-11-02 NOTE — Progress Notes (Signed)
[facility] Daily Progress Note   Subjective: Seen and examined during vein mapping.  Patient other services be placed PermCath approximately 2 weeks ago.  Without complaint.  Objective: Vitals:   11/02/20 0153 11/02/20 0513 11/02/20 0754 11/02/20 1137  BP:  (!) 166/90 (!) 172/104 (!) 190/99  Pulse:  76 75 78  Resp:  20 19   Temp:  98.7 F (37.1 C) 98.2 F (36.8 C) 97.6 F (36.4 C)  TempSrc:  Oral Oral Oral  SpO2:  100%  97%  Weight: 112 kg     Height:        Intake/Output Summary (Last 24 hours) at 11/02/2020 1438 Last data filed at 11/01/2020 2022 Gross per 24 hour  Intake 278 ml  Output 100 ml  Net 178 ml   Physical Exam: A&Ox3, NAD CV: RRR Pulmonary: CTA Bilaterally Abdomen: Soft, Nontender, Nondistended Vascular:  Extremities warm distally toes.  PermCath intact clean dry.   Laboratory: CBC    Component Value Date/Time   WBC 6.1 11/02/2020 0734   HGB 7.9 (L) 11/02/2020 0734   HCT 26.8 (L) 11/02/2020 0734   PLT 188 11/02/2020 0734   BMET    Component Value Date/Time   NA 139 11/02/2020 0734   K 3.5 11/02/2020 0734   CL 101 11/02/2020 0734   CO2 27 11/02/2020 0734   GLUCOSE 95 11/02/2020 0734   BUN 34 (H) 11/02/2020 0734   BUN 30 (A) 04/11/2015 0000   CREATININE 6.94 (H) 11/02/2020 0734   CALCIUM 8.4 (L) 11/02/2020 0734   CALCIUM 6.4 (LL) 10/22/2020 1314   GFRNONAA 7 (L) 11/02/2020 0734   GFRAA 12 (L) 01/23/2020 0249   Assessment/Planning: The patient is a 44 year old female with known history of end-stage renal disease requiring hemodialysis currently maintained via PermCath  1) End-stage Renal Disease: PermCath placed in the past but has not shown any frank kidney function. Patient is currently maintained by a PermCath however this is not a permanent dialysis access. Vein mapping is pending use of both fistula and graft creation was discussed with the patient. We will plan on left upper extremity dialysis access creation  fistula versus graft appointment and mapping results are available. Patient is in agreement and would like to move forward.    Discussed with Dr. Ellis Parents Trenten Watchman PA-C 11/02/2020 2:38 PM

## 2020-11-03 ENCOUNTER — Ambulatory Visit: Payer: Medicaid Other | Admitting: Family

## 2020-11-03 DIAGNOSIS — I160 Hypertensive urgency: Secondary | ICD-10-CM | POA: Diagnosis not present

## 2020-11-03 LAB — CBC
HCT: 28.2 % — ABNORMAL LOW (ref 36.0–46.0)
Hemoglobin: 8.5 g/dL — ABNORMAL LOW (ref 12.0–15.0)
MCH: 24.1 pg — ABNORMAL LOW (ref 26.0–34.0)
MCHC: 30.1 g/dL (ref 30.0–36.0)
MCV: 80.1 fL (ref 80.0–100.0)
Platelets: 204 10*3/uL (ref 150–400)
RBC: 3.52 MIL/uL — ABNORMAL LOW (ref 3.87–5.11)
RDW: 17.8 % — ABNORMAL HIGH (ref 11.5–15.5)
WBC: 6.6 10*3/uL (ref 4.0–10.5)
nRBC: 0 % (ref 0.0–0.2)

## 2020-11-03 LAB — BASIC METABOLIC PANEL
Anion gap: 11 (ref 5–15)
BUN: 25 mg/dL — ABNORMAL HIGH (ref 6–20)
CO2: 29 mmol/L (ref 22–32)
Calcium: 8.4 mg/dL — ABNORMAL LOW (ref 8.9–10.3)
Chloride: 98 mmol/L (ref 98–111)
Creatinine, Ser: 5.44 mg/dL — ABNORMAL HIGH (ref 0.44–1.00)
GFR, Estimated: 9 mL/min — ABNORMAL LOW (ref >60)
Glucose, Bld: 110 mg/dL — ABNORMAL HIGH (ref 70–99)
Potassium: 3.1 mmol/L — ABNORMAL LOW (ref 3.5–5.1)
Sodium: 138 mmol/L (ref 135–145)

## 2020-11-03 LAB — PROTIME-INR
INR: 1.1 (ref 0.8–1.2)
Prothrombin Time: 13.9 seconds (ref 11.4–15.2)

## 2020-11-03 LAB — MAGNESIUM: Magnesium: 1.9 mg/dL (ref 1.7–2.4)

## 2020-11-03 LAB — GLUCOSE, CAPILLARY
Glucose-Capillary: 95 mg/dL (ref 70–99)
Glucose-Capillary: 93 mg/dL (ref 70–99)
Glucose-Capillary: 153 mg/dL — ABNORMAL HIGH (ref 70–99)

## 2020-11-03 LAB — APTT: aPTT: 32 seconds (ref 24–36)

## 2020-11-03 MED ORDER — CEFAZOLIN SODIUM-DEXTROSE 1-4 GM/50ML-% IV SOLN
1.0000 g | INTRAVENOUS | Status: DC
  Filled 2020-11-03: qty 50

## 2020-11-03 MED ORDER — CEFAZOLIN SODIUM-DEXTROSE 2-4 GM/100ML-% IV SOLN
2.0000 g | INTRAVENOUS | Status: AC
  Filled 2020-11-03: qty 100

## 2020-11-03 MED ORDER — KETOTIFEN FUMARATE 0.025 % OP SOLN
1.0000 [drp] | Freq: Two times a day (BID) | OPHTHALMIC | Status: DC
  Administered 2020-11-03 – 2020-11-05 (×6): 1 [drp] via OPHTHALMIC
  Filled 2020-11-03: qty 5

## 2020-11-03 MED ORDER — KETOTIFEN FUMARATE 0.025 % OP SOLN
1.0000 [drp] | Freq: Two times a day (BID) | OPHTHALMIC | Status: DC
  Filled 2020-11-03: qty 5

## 2020-11-03 NOTE — Care Management Important Message (Signed)
Important Message  Patient Details  Name: Renee [name] MRN: [id number] Date of Birth: 1976-07-15   Medicare Important Message Given:  Yes     Dannette Barbara 11/03/2020, 1:08 PM

## 2020-11-03 NOTE — Progress Notes (Signed)
PROGRESS NOTE    Renee [name]  GEX:[id number] DOB: 03-17-1976 DOA: 10/31/2020 PCP: Minda Ditto, MD    Brief Narrative:  44 year old female known history of CKD stage V, hypertension, diabetes admitted for worsening shortness of breath.  Patient has a recent hemodialysis started 10/24/2020.  On presentation she was markedly hypertensive requiring nicardipine infusion.  This been weaned off.  Patient transferred to general medical floor on 11/01/2020 for further management.  Patient underwent hemodialysis on 11/02/2020.  Has some issues during dialysis with hypertension and malaise.  Blood pressure remains uncontrolled.  Scheduled for another dialysis run today 11/03/2020.  Blood pressure regimen adjusted per nephrology.   Assessment & Plan:   Principal Problem:   Hypertensive urgency Active Problems:   Diabetes mellitus, type 2 (HCC)   Chronic diastolic heart failure with preserved ejection fraction (HCC)   Anemia   ESRD (end stage renal disease) on dialysis [facility])  End-stage renal disease requiring hemodialysis Patient was recently discharged last week and was started on hemodialysis on 10/25 Nephrology following for inpatient HD needs  Hypertensive urgency Was started on nicardipine drip for blood pressure of 222/124 for which she required stepdown The Cardene drip is been weaned down the patient's blood pressure control remain suboptimal Plan: Oral blood pressure regimen adjusted per nephrology We will continue to monitor blood pressure and adjust medications as needed Dialysis per nephrology  Anemia of chronic kidney disease Hemoglobin appears stable in the mid eights.  No obvious bleeding noted.  Type 2 diabetes with CKD 5 Lantus 15 units subcu daily Consult diabetic nurse, last hemoglobin A1c was 5.9  Morbid obesity Body mass index is 44.76 kg/m. This complicates overall prognosis and care   DVT prophylaxis: Subcutaneous heparin Code Status: Full Family  Communication: Spouse at bedside 11/02/2020 Disposition Plan: Status is: Inpatient  Remains inpatient appropriate because:IV treatments appropriate due to intensity of illness or inability to take PO   Dispo:  Patient From: Home  Planned Disposition: Home  Expected discharge date: 11/04/20  Medically stable for discharge: No   Still requiring inpatient evaluation management for blood pressure control.  Anticipate 24 to 48 hours more.       Consultants:   Nephrology  Procedures:   None  Antimicrobials:   None   Subjective: Patient seen and examined.  Tearful this morning.  Has a sick family member.  Objective: Vitals:   11/03/20 0728 11/03/20 1142 11/03/20 1230 11/03/20 1245  BP: (!) 184/88 (!) 184/95  (!) 190/100  Pulse: 100 72    Resp: 18 20    Temp: 98.4 F (36.9 C) 98.2 F (36.8 C) 99.1 F (37.3 C)   TempSrc: Oral Oral Oral   SpO2: 100% 96%    Weight:      Height:        Intake/Output Summary (Last 24 hours) at 11/03/2020 1345 Last data filed at 11/03/2020 0927 Gross per 24 hour  Intake --  Output 4650 ml  Net -4650 ml   Filed Weights   10/31/20 1218 11/02/20 0153 11/03/20 0431  Weight: 122 kg 112 kg 106.5 kg    Examination:  General exam: Tearful Respiratory system: Poor respiratory effort.  Bibasilar crackles.  2 L nasal cannula Cardiovascular system: S1 & S2 heard, RRR. No JVD, murmurs, rubs, gallops or clicks. No pedal edema. Gastrointestinal system: Abdomen is nondistended, soft and nontender. No organomegaly or masses felt. Normal bowel sounds heard. Central nervous system: Alert and oriented. No focal neurological deficits. Extremities: Symmetric 5 x 5  power. Skin: No obvious rashes or lesions.  Right IJ PermCath Psychiatry: Judgement and insight appear normal. Mood & affect appropriate.     Data Reviewed: I have personally reviewed following labs and imaging studies  CBC: Recent Labs  Lab 10/31/20 1229 10/31/20 2326  11/02/20 0734 11/02/20 1626 11/03/20 1150  WBC 11.3* 9.1 6.1 7.5 6.6  NEUTROABS 9.8*  --  4.6  --   --   HGB 9.2* 8.8* 7.9* 8.3* 8.5*  HCT 31.2* 30.1* 26.8* 28.2* 28.2*  MCV 80.6 80.5 81.5 80.8 80.1  PLT 266 228 188 223 361   Basic Metabolic Panel: Recent Labs  Lab 10/31/20 1229 11/02/20 0734 11/02/20 1626 11/03/20 1150  NA 143 139 139 138  K 3.5 3.5 3.2* 3.1*  CL 105 101 99 98  CO2 24 27 29 29   GLUCOSE 126* 95 114* 110*  BUN 49* 34* 29* 25*  CREATININE 7.68* 6.94* 5.54* 5.44*  CALCIUM 9.0 8.4* 8.5* 8.4*  MG  --   --   --  1.9  PHOS  --   --  3.7  --    GFR: Estimated Creatinine Clearance: 15.8 mL/min (A) (by C-G formula based on SCr of 5.44 mg/dL (H)). Liver Function Tests: Recent Labs  Lab 10/31/20 1229 11/02/20 1626  AST 26  --   ALT 29  --   ALKPHOS 142*  --   BILITOT 0.9  --   PROT 7.8  --   ALBUMIN 3.8 3.4*   No results for input(s): LIPASE, AMYLASE in the last 168 hours. No results for input(s): AMMONIA in the last 168 hours. Coagulation Profile: Recent Labs  Lab 11/03/20 1150  INR 1.1   Cardiac Enzymes: No results for input(s): CKTOTAL, CKMB, CKMBINDEX, TROPONINI in the last 168 hours. BNP (last 3 results) No results for input(s): PROBNP in the last 8760 hours. HbA1C: No results for input(s): HGBA1C in the last 72 hours. CBG: Recent Labs  Lab 11/02/20 0742 11/02/20 1134 11/02/20 1905 11/02/20 2145 11/03/20 0751  GLUCAP 99 136* 101* 114* 95   Lipid Profile: No results for input(s): CHOL, HDL, LDLCALC, TRIG, CHOLHDL, LDLDIRECT in the last 72 hours. Thyroid Function Tests: Recent Labs    10/31/20 2326  TSH 4.538*  FREET4 1.71*   Anemia Panel: Recent Labs    10/31/20 2326  VITAMINB12 878  FOLATE 7.8  FERRITIN 194  TIBC 340  IRON 46  RETICCTPCT 2.3   Sepsis Labs: No results for input(s): PROCALCITON, LATICACIDVEN in the last 168 hours.  Recent Results (from the past 240 hour(s))  Respiratory Panel by RT PCR (Flu A&B, Covid)  - Nasopharyngeal Swab     Status: None   Collection Time: 10/31/20  1:02 PM   Specimen: Nasopharyngeal Swab  Result Value Ref Range Status   SARS Coronavirus 2 by RT PCR NEGATIVE NEGATIVE Final    Comment: (NOTE) SARS-CoV-2 target nucleic acids are NOT DETECTED.  The SARS-CoV-2 RNA is generally detectable in upper respiratoy specimens during the acute phase of infection. The lowest concentration of SARS-CoV-2 viral copies this assay can detect is 131 copies/mL. A negative result does not preclude SARS-Cov-2 infection and should not be used as the sole basis for treatment or other patient management decisions. A negative result may occur with  improper specimen collection/handling, submission of specimen other than nasopharyngeal swab, presence of viral mutation(s) within the areas targeted by this assay, and inadequate number of viral copies (<131 copies/mL). A negative result must be combined with clinical observations,  patient history, and epidemiological information. The expected result is Negative.  Fact Sheet for Patients:  PinkCheek.be  Fact Sheet for Healthcare Providers:  GravelBags.it  This test is no t yet approved or cleared by the [place] FDA and  has been authorized for detection and/or diagnosis of SARS-CoV-2 by FDA under an Emergency Use Authorization (EUA). This EUA will remain  in effect (meaning this test can be used) for the duration of the COVID-19 declaration under Section 564(b)(1) of the Act, 21 U.S.C. section 360bbb-3(b)(1), unless the authorization is terminated or revoked sooner.     Influenza A by PCR NEGATIVE NEGATIVE Final   Influenza B by PCR NEGATIVE NEGATIVE Final    Comment: (NOTE) The Xpert Xpress SARS-CoV-2/FLU/RSV assay is intended as an aid in  the diagnosis of influenza from Nasopharyngeal swab specimens and  should not be used as a sole basis for treatment. Nasal washings and   aspirates are unacceptable for Xpert Xpress SARS-CoV-2/FLU/RSV  testing.  Fact Sheet for Patients: PinkCheek.be  Fact Sheet for Healthcare Providers: GravelBags.it  This test is not yet approved or cleared by the [place] FDA and  has been authorized for detection and/or diagnosis of SARS-CoV-2 by  FDA under an Emergency Use Authorization (EUA). This EUA will remain  in effect (meaning this test can be used) for the duration of the  Covid-19 declaration under Section 564(b)(1) of the Act, 21  U.S.C. section 360bbb-3(b)(1), unless the authorization is  terminated or revoked. Performed at [facility], [address] [place], [place] [address]          Radiology Studies: [place] UE VEIN MAPPING LEFT (PRE-OP AVF)  Result Date: 11/03/2020 CLINICAL DATA:  End-stage renal disease and planned dialysis fistula versus graft placement in the left upper extremity. EXAM: [place] EXTREM UP VEIN MAPPING COMPARISON:  None. FINDINGS: LEFT ARTERIES Wrist Radial Artery: Size 1.31mm Waveform triphasic Wrist Ulnar Artery: Size 1.40mm Waveform triphasic Prox. Forearm Radial Artery: Size 1.58mm Waveform triphasic Upper Arm Brachial Artery: Size 3.55mm Waveform triphasic LEFT VEINS Forearm Cephalic Vein: Prox 5.6LO Distal 1.60mm Depth 7.5IE Upper Arm Cephalic Vein: Prox 3.3IR Distal 5.62mm Depth 5.1OA Upper Arm Basilic Vein: Prox 4.1YS Distal 5.71mm Depth 1.46mm Upper Arm Brachial Vein: Prox 7.69mm Distal 3.25mm Depth 2.18mm ADDITIONAL LEFT VEINS Axillary Vein:  8.5mm Subclavian Vein: Patient: Yes respiratory Phasicity: Present Internal Jugular Vein: Patent: Yes respiratory Phasicity: Present Branches > 2 mm: There are 2 separate proximal upper arm cephalic vein branches measuring 2.6 and 2.9 mm. Distal upper arm basilic vein branch measures 3 mm. IMPRESSION: Left upper extremity vein mapping characterization by ultrasound as above with arterial evaluation.  Electronically Signed   By: Aletta Edouard M.D.   On: 11/03/2020 10:51        Scheduled Meds: . amLODipine  10 mg Oral Daily  . aspirin EC  81 mg Oral Daily  . atorvastatin  10 mg Oral Daily  . calcium carbonate  1,250 mg Oral BID WC  . Chlorhexidine Gluconate Cloth  6 each Topical Q0600  . cloNIDine  0.2 mg Oral TID  . heparin  5,000 Units Subcutaneous Q8H  . hydrALAZINE  50 mg Oral Q6H  . insulin aspart  0-5 Units Subcutaneous QHS  . insulin aspart  0-6 Units Subcutaneous TID WC  . insulin aspart  2 Units Subcutaneous TID WC  . insulin glargine  15 Units Subcutaneous Daily  . irbesartan  300 mg Oral Daily  . isosorbide mononitrate  120 mg Oral  Daily  . ketotifen  1 drop Both Eyes BID  . labetalol  300 mg Oral BID  . pantoprazole  40 mg Oral BID  . sodium chloride flush  3 mL Intravenous Q12H  . torsemide  100 mg Oral Once per day on Tue Thu Sat  . [START ON 11/06/2020] Vitamin D (Ergocalciferol)  50,000 Units Oral Q7 days   Continuous Infusions: . sodium chloride    .  ceFAZolin (ANCEF) IV       LOS: 3 days    Time spent: 25 minutes    Sidney Ace, MD [facility] Pager [phone]  If 7PM-7AM, please contact night-coverage 11/03/2020, 1:45 PM

## 2020-11-03 NOTE — Progress Notes (Signed)
Mobility Specialist - Progress Note   11/03/20 1200  Mobility  Activity Contraindicated/medical hold  Mobility performed by Mobility specialist    Pt currently undergoing HD treatment. Will attempt session at another date/time.    Kathee Delton Mobility Specialist 11/03/20, 12:44 PM

## 2020-11-03 NOTE — Progress Notes (Signed)
[facility]  ROUNDING NOTE   Subjective:   Hemodialysis today.  AVF rescheduled  Patent's father is in the ICU and this is upsetting her.   Objective:  Vital signs in last 24 hours:  Temp:  [98 F (36.7 C)-99.1 F (37.3 C)] 99.1 F (37.3 C) (11/04 1230) Pulse Rate:  [71-100] 72 (11/04 1142) Resp:  [16-24] 20 (11/04 1142) BP: (172-225)/(83-113) 190/100 (11/04 1245) SpO2:  [96 %-100 %] 96 % (11/04 1142) Weight:  [106.5 kg] 106.5 kg (11/04 0431)  Weight change: -5.538 kg Filed Weights   10/31/20 1218 11/02/20 0153 11/03/20 0431  Weight: 122 kg 112 kg 106.5 kg    Intake/Output: I/O last 3 completed shifts: In: 153 [P.O.:150; I.V.:3] Out: 3800 [Urine:800; Other:3000]   Intake/Output this shift:  Total I/O In: -  Out: 850 [Urine:850]  Physical Exam: General: Sitting up in bed,    Head: Normocephalic,Atraumatic  Eyes: Anicteric  Lungs:   Lungs diminished at bases, normal effort  Heart:   S1S2, Regular  Abdomen:   Non distended,non tender  Extremities:  2+ peripheral edema  Neurologic:  Oriented x3  Skin: No acute lesions or rashes  Access:  Rt IJ Permcath    Basic Metabolic Panel: Recent Labs  Lab 10/31/20 1229 10/31/20 1229 11/02/20 0734 11/02/20 1626 11/03/20 1150  NA 143  --  139 139 138  K 3.5  --  3.5 3.2* 3.1*  CL 105  --  101 99 98  CO2 24  --  27 29 29   GLUCOSE 126*  --  95 114* 110*  BUN 49*  --  34* 29* 25*  CREATININE 7.68*  --  6.94* 5.54* 5.44*  CALCIUM 9.0   < > 8.4* 8.5* 8.4*  MG  --   --   --   --  1.9  PHOS  --   --   --  3.7  --    < > = values in this interval not displayed.    Liver Function Tests: Recent Labs  Lab 10/31/20 1229 11/02/20 1626  AST 26  --   ALT 29  --   ALKPHOS 142*  --   BILITOT 0.9  --   PROT 7.8  --   ALBUMIN 3.8 3.4*   No results for input(s): LIPASE, AMYLASE in the last 168 hours. No results for input(s): AMMONIA in the last 168 hours.  CBC: Recent Labs  Lab 10/31/20 1229  10/31/20 2326 11/02/20 0734 11/02/20 1626 11/03/20 1150  WBC 11.3* 9.1 6.1 7.5 6.6  NEUTROABS 9.8*  --  4.6  --   --   HGB 9.2* 8.8* 7.9* 8.3* 8.5*  HCT 31.2* 30.1* 26.8* 28.2* 28.2*  MCV 80.6 80.5 81.5 80.8 80.1  PLT 266 228 188 223 204    Cardiac Enzymes: No results for input(s): CKTOTAL, CKMB, CKMBINDEX, TROPONINI in the last 168 hours.  BNP: Invalid input(s): POCBNP  CBG: Recent Labs  Lab 11/02/20 0742 11/02/20 1134 11/02/20 1905 11/02/20 2145 11/03/20 0751  GLUCAP 99 136* 101* 114* 95    Microbiology: Results for orders placed or performed during the hospital encounter of 10/31/20  Respiratory Panel by RT PCR (Flu A&B, Covid) - Nasopharyngeal Swab     Status: None   Collection Time: 10/31/20  1:02 PM   Specimen: Nasopharyngeal Swab  Result Value Ref Range Status   SARS Coronavirus 2 by RT PCR NEGATIVE NEGATIVE Final    Comment: (NOTE) SARS-CoV-2 target nucleic acids are NOT DETECTED.  The  SARS-CoV-2 RNA is generally detectable in upper respiratoy specimens during the acute phase of infection. The lowest concentration of SARS-CoV-2 viral copies this assay can detect is 131 copies/mL. A negative result does not preclude SARS-Cov-2 infection and should not be used as the sole basis for treatment or other patient management decisions. A negative result may occur with  improper specimen collection/handling, submission of specimen other than nasopharyngeal swab, presence of viral mutation(s) within the areas targeted by this assay, and inadequate number of viral copies (<131 copies/mL). A negative result must be combined with clinical observations, patient history, and epidemiological information. The expected result is Negative.  Fact Sheet for Patients:  PinkCheek.be  Fact Sheet for Healthcare Providers:  GravelBags.it  This test is no t yet approved or cleared by the [place] FDA and  has been  authorized for detection and/or diagnosis of SARS-CoV-2 by FDA under an Emergency Use Authorization (EUA). This EUA will remain  in effect (meaning this test can be used) for the duration of the COVID-19 declaration under Section 564(b)(1) of the Act, 21 U.S.C. section 360bbb-3(b)(1), unless the authorization is terminated or revoked sooner.     Influenza A by PCR NEGATIVE NEGATIVE Final   Influenza B by PCR NEGATIVE NEGATIVE Final    Comment: (NOTE) The Xpert Xpress SARS-CoV-2/FLU/RSV assay is intended as an aid in  the diagnosis of influenza from Nasopharyngeal swab specimens and  should not be used as a sole basis for treatment. Nasal washings and  aspirates are unacceptable for Xpert Xpress SARS-CoV-2/FLU/RSV  testing.  Fact Sheet for Patients: PinkCheek.be  Fact Sheet for Healthcare Providers: GravelBags.it  This test is not yet approved or cleared by the [place] FDA and  has been authorized for detection and/or diagnosis of SARS-CoV-2 by  FDA under an Emergency Use Authorization (EUA). This EUA will remain  in effect (meaning this test can be used) for the duration of the  Covid-19 declaration under Section 564(b)(1) of the Act, 21  U.S.C. section 360bbb-3(b)(1), unless the authorization is  terminated or revoked. Performed at [facility], [address] [place], [place] [address]     Coagulation Studies: Recent Labs    11/03/20 1150  LABPROT 13.9  INR 1.1    Urinalysis: No results for input(s): COLORURINE, LABSPEC, PHURINE, GLUCOSEU, HGBUR, BILIRUBINUR, KETONESUR, PROTEINUR, UROBILINOGEN, NITRITE, LEUKOCYTESUR in the last 72 hours.  Invalid input(s): APPERANCEUR    Imaging: [place] UE VEIN MAPPING LEFT (PRE-OP AVF)  Result Date: 11/03/2020 CLINICAL DATA:  End-stage renal disease and planned dialysis fistula versus graft placement in the left upper extremity. EXAM: [place] EXTREM UP VEIN MAPPING  COMPARISON:  None. FINDINGS: LEFT ARTERIES Wrist Radial Artery: Size 1.46mm Waveform triphasic Wrist Ulnar Artery: Size 1.52mm Waveform triphasic Prox. Forearm Radial Artery: Size 1.52mm Waveform triphasic Upper Arm Brachial Artery: Size 3.80mm Waveform triphasic LEFT VEINS Forearm Cephalic Vein: Prox 8.5IO Distal 1.83mm Depth 2.7OJ Upper Arm Cephalic Vein: Prox 5.0KX Distal 5.49mm Depth 3.8HW Upper Arm Basilic Vein: Prox 2.9HB Distal 5.54mm Depth 1.73mm Upper Arm Brachial Vein: Prox 7.22mm Distal 3.31mm Depth 2.72mm ADDITIONAL LEFT VEINS Axillary Vein:  8.52mm Subclavian Vein: Patient: Yes respiratory Phasicity: Present Internal Jugular Vein: Patent: Yes respiratory Phasicity: Present Branches > 2 mm: There are 2 separate proximal upper arm cephalic vein branches measuring 2.6 and 2.9 mm. Distal upper arm basilic vein branch measures 3 mm. IMPRESSION: Left upper extremity vein mapping characterization by ultrasound as above with arterial evaluation. Electronically Signed   By: Eulas Post  Kathlene Cote M.D.   On: 11/03/2020 10:51     Medications:   . sodium chloride    .  ceFAZolin (ANCEF) IV     . amLODipine  10 mg Oral Daily  . aspirin EC  81 mg Oral Daily  . atorvastatin  10 mg Oral Daily  . calcium carbonate  1,250 mg Oral BID WC  . Chlorhexidine Gluconate Cloth  6 each Topical Q0600  . cloNIDine  0.2 mg Oral TID  . heparin  5,000 Units Subcutaneous Q8H  . hydrALAZINE  50 mg Oral Q6H  . insulin aspart  0-5 Units Subcutaneous QHS  . insulin aspart  0-6 Units Subcutaneous TID WC  . insulin aspart  2 Units Subcutaneous TID WC  . insulin glargine  15 Units Subcutaneous Daily  . irbesartan  300 mg Oral Daily  . isosorbide mononitrate  120 mg Oral Daily  . ketotifen  1 drop Both Eyes BID  . labetalol  300 mg Oral BID  . pantoprazole  40 mg Oral BID  . sodium chloride flush  3 mL Intravenous Q12H  . torsemide  100 mg Oral Once per day on Tue Thu Sat  . [START ON 11/06/2020] Vitamin D (Ergocalciferol)  50,000  Units Oral Q7 days   sodium chloride, acetaminophen **OR** acetaminophen, calcium carbonate, camphor-menthol **AND** hydrOXYzine, docusate sodium, feeding supplement (NEPRO CARB STEADY), hydrALAZINE, labetalol, nitroGLYCERIN, sodium chloride flush, sorbitol, zolpidem  Assessment/ Plan  Renee [name] is a 44 y.o. black female with end stage renal disease on hemodialysis, hypertension, insulin dependent diabetes mellitus, who is admitted to [facility] on 10/31/2020 for Hypertensive urgency [I16.0] Elevated brain natriuretic peptide (BNP) level [R79.89] Acute respiratory failure with hypoxia ([facility]) [J96.01] Troponin I above reference range [R77.8] Hypervolemia, unspecified hypervolemia type [E87.70]  [facility] MWF [facility] RIJ permcath 115kg  1. End Stage Renal Disease Dialysis today.  Vascular team,scheduled patient for AVF placement on Friday  2. Hypertension: with hypertensive emergency on admission. Volume driven, continue ultrafiltration with HD  Irbesartan 300mg  started this admission Torsemide 100mg  on nondialysis days.    3. Anemia with chronic kidney disease: EPO on MWF HD treatments   4. Secondary Hyperparathyroidism No acute indication for Phos binders  Lavonia Dana, [facility]  11/4/20212:57 PM

## 2020-11-04 ENCOUNTER — Inpatient Hospital Stay: Payer: Medicare Other

## 2020-11-04 ENCOUNTER — Inpatient Hospital Stay: Payer: Medicare Other | Admitting: Certified Registered"

## 2020-11-04 ENCOUNTER — Encounter: Payer: Self-pay | Admitting: Internal Medicine

## 2020-11-04 ENCOUNTER — Encounter
Admission: EM | Disposition: A | Discharge status: Home Health | Payer: Self-pay | Source: Home / Self Care | Attending: Internal Medicine

## 2020-11-04 DIAGNOSIS — N185 Chronic kidney disease, stage 5: Secondary | ICD-10-CM

## 2020-11-04 DIAGNOSIS — I160 Hypertensive urgency: Secondary | ICD-10-CM | POA: Diagnosis not present

## 2020-11-04 HISTORY — PX: AV FISTULA PLACEMENT: SHX1204

## 2020-11-04 LAB — BASIC METABOLIC PANEL
Anion gap: 11 (ref 5–15)
BUN: 19 mg/dL (ref 6–20)
CO2: 29 mmol/L (ref 22–32)
Calcium: 8.6 mg/dL — ABNORMAL LOW (ref 8.9–10.3)
Chloride: 98 mmol/L (ref 98–111)
Creatinine, Ser: 4.2 mg/dL — ABNORMAL HIGH (ref 0.44–1.00)
GFR, Estimated: 13 mL/min — ABNORMAL LOW (ref >60)
Glucose, Bld: 110 mg/dL — ABNORMAL HIGH (ref 70–99)
Potassium: 3.5 mmol/L (ref 3.5–5.1)
Sodium: 138 mmol/L (ref 135–145)

## 2020-11-04 LAB — HCG, QUANTITATIVE, PREGNANCY: hCG, Beta Chain, Quant, S: 2 m[IU]/mL (ref <5)

## 2020-11-04 LAB — GLUCOSE, CAPILLARY
Glucose-Capillary: 110 mg/dL — ABNORMAL HIGH (ref 70–99)
Glucose-Capillary: 106 mg/dL — ABNORMAL HIGH (ref 70–99)
Glucose-Capillary: 116 mg/dL — ABNORMAL HIGH (ref 70–99)
Glucose-Capillary: 112 mg/dL — ABNORMAL HIGH (ref 70–99)
Glucose-Capillary: 109 mg/dL — ABNORMAL HIGH (ref 70–99)

## 2020-11-04 LAB — POCT PREGNANCY, URINE: Preg Test, Ur: NEGATIVE

## 2020-11-04 SURGERY — ARTERIOVENOUS (AV) FISTULA CREATION
Anesthesia: General | Site: Arm Upper | Laterality: Left

## 2020-11-04 MED ORDER — CHLORHEXIDINE GLUCONATE 0.12 % MT SOLN
OROMUCOSAL | Status: AC
  Administered 2020-11-04: 15 mL
  Filled 2020-11-04: qty 15

## 2020-11-04 MED ORDER — BUPIVACAINE HCL (PF) 0.5 % IJ SOLN
INTRAMUSCULAR | Status: AC
  Filled 2020-11-04: qty 10

## 2020-11-04 MED ORDER — CEFAZOLIN SODIUM-DEXTROSE 1-4 GM/50ML-% IV SOLN
INTRAVENOUS | Status: DC | PRN
  Administered 2020-11-04: 1 g via INTRAVENOUS

## 2020-11-04 MED ORDER — FENTANYL CITRATE (PF) 100 MCG/2ML IJ SOLN: 25.0000 ug | INTRAMUSCULAR | Status: DC | PRN

## 2020-11-04 MED ORDER — FENTANYL CITRATE (PF) 100 MCG/2ML IJ SOLN
INTRAMUSCULAR | Status: AC
  Filled 2020-11-04: qty 2

## 2020-11-04 MED ORDER — LIDOCAINE HCL (PF) 2 % IJ SOLN
INTRAMUSCULAR | Status: AC
  Filled 2020-11-04: qty 5

## 2020-11-04 MED ORDER — HEPARIN SODIUM (PORCINE) 5000 UNIT/ML IJ SOLN
INTRAMUSCULAR | Status: AC
  Filled 2020-11-04: qty 1

## 2020-11-04 MED ORDER — DEXAMETHASONE SODIUM PHOSPHATE 10 MG/ML IJ SOLN
INTRAMUSCULAR | Status: AC
  Filled 2020-11-04: qty 1

## 2020-11-04 MED ORDER — MIDAZOLAM HCL 2 MG/2ML IJ SOLN
INTRAMUSCULAR | Status: AC
  Filled 2020-11-04: qty 2

## 2020-11-04 MED ORDER — ROCURONIUM BROMIDE 10 MG/ML (PF) SYRINGE
PREFILLED_SYRINGE | INTRAVENOUS | Status: AC
  Filled 2020-11-04: qty 10

## 2020-11-04 MED ORDER — DEXMEDETOMIDINE (PRECEDEX) IN NS 20 MCG/5ML (4 MCG/ML) IV SYRINGE
PREFILLED_SYRINGE | INTRAVENOUS | Status: AC
  Filled 2020-11-04: qty 5

## 2020-11-04 MED ORDER — NEPRO/CARBSTEADY PO LIQD
237.0000 mL | Freq: Two times a day (BID) | ORAL | Status: DC
  Administered 2020-11-05 – 2020-11-06 (×2): 237 mL via ORAL
  Filled 2020-11-04 (×3): qty 237

## 2020-11-04 MED ORDER — RENA-VITE PO TABS
1.0000 | ORAL_TABLET | Freq: Every day | ORAL | Status: DC
  Administered 2020-11-04 – 2020-11-05 (×2): 1 via ORAL
  Filled 2020-11-04 (×2): qty 1

## 2020-11-04 MED ORDER — CEFAZOLIN SODIUM-DEXTROSE 1-4 GM/50ML-% IV SOLN
INTRAVENOUS | Status: AC
  Filled 2020-11-04: qty 50

## 2020-11-04 MED ORDER — FENTANYL CITRATE (PF) 100 MCG/2ML IJ SOLN: 50.0000 ug | Freq: Once | INTRAMUSCULAR | Status: AC

## 2020-11-04 MED ORDER — CHLORHEXIDINE GLUCONATE CLOTH 2 % EX PADS: 6.0000 | MEDICATED_PAD | Freq: Once | CUTANEOUS | Status: DC

## 2020-11-04 MED ORDER — SODIUM CHLORIDE 0.9 % IV SOLN: INTRAVENOUS | Status: DC

## 2020-11-04 MED ORDER — GLYCOPYRROLATE 0.2 MG/ML IJ SOLN
INTRAMUSCULAR | Status: AC
  Filled 2020-11-04: qty 1

## 2020-11-04 MED ORDER — FENTANYL CITRATE (PF) 100 MCG/2ML IJ SOLN
INTRAMUSCULAR | Status: DC | PRN
Start: 2020-11-04 — End: 2020-11-04
  Administered 2020-11-04 (×2): 25 ug via INTRAVENOUS

## 2020-11-04 MED ORDER — SODIUM CHLORIDE 0.9 % IV SOLN
INTRAVENOUS | Status: DC | PRN
  Administered 2020-11-04: 50 mL via INTRAMUSCULAR

## 2020-11-04 MED ORDER — BUPIVACAINE-EPINEPHRINE (PF) 0.5% -1:200000 IJ SOLN
INTRAMUSCULAR | Status: AC
  Filled 2020-11-04: qty 30

## 2020-11-04 MED ORDER — LIDOCAINE HCL (PF) 1 % IJ SOLN
INTRAMUSCULAR | Status: AC
  Filled 2020-11-04: qty 5

## 2020-11-04 MED ORDER — INSULIN GLARGINE 100 UNIT/ML ~~LOC~~ SOLN
7.0000 [IU] | Freq: Every day | SUBCUTANEOUS | Status: DC
  Administered 2020-11-04: 7 [IU] via SUBCUTANEOUS
  Filled 2020-11-04 (×2): qty 0.07

## 2020-11-04 MED ORDER — MIDAZOLAM HCL 2 MG/2ML IJ SOLN
INTRAMUSCULAR | Status: AC
  Administered 2020-11-04: 1 mg via INTRAVENOUS
  Filled 2020-11-04: qty 2

## 2020-11-04 MED ORDER — LIDOCAINE HCL (PF) 2 % IJ SOLN
INTRAMUSCULAR | Status: DC | PRN
  Administered 2020-11-04: 140 mg via PERINEURAL
  Administered 2020-11-04: 60 mg via PERINEURAL

## 2020-11-04 MED ORDER — EPOETIN ALFA 10000 UNIT/ML IJ SOLN: 10000.0000 [IU] | INTRAMUSCULAR | Status: DC

## 2020-11-04 MED ORDER — PROPOFOL 500 MG/50ML IV EMUL
INTRAVENOUS | Status: DC | PRN
  Administered 2020-11-04: 75 ug/kg/min via INTRAVENOUS
  Administered 2020-11-04: 100 ug/kg/min via INTRAVENOUS

## 2020-11-04 MED ORDER — DEXMEDETOMIDINE (PRECEDEX) IN NS 20 MCG/5ML (4 MCG/ML) IV SYRINGE: PREFILLED_SYRINGE | INTRAVENOUS | Status: DC | PRN

## 2020-11-04 MED ORDER — HYDROCODONE-ACETAMINOPHEN 5-325 MG PO TABS
1.0000 | ORAL_TABLET | Freq: Four times a day (QID) | ORAL | Status: DC | PRN
  Administered 2020-11-04 – 2020-11-06 (×4): 2 via ORAL
  Filled 2020-11-04 (×4): qty 2

## 2020-11-04 MED ORDER — DEXMEDETOMIDINE (PRECEDEX) IN NS 20 MCG/5ML (4 MCG/ML) IV SYRINGE
PREFILLED_SYRINGE | INTRAVENOUS | Status: DC | PRN
  Administered 2020-11-04: 4 ug via INTRAVENOUS

## 2020-11-04 MED ORDER — MIDAZOLAM HCL 2 MG/2ML IJ SOLN
INTRAMUSCULAR | Status: DC | PRN
  Administered 2020-11-04: 1 mg via INTRAVENOUS

## 2020-11-04 MED ORDER — OXYCODONE HCL 5 MG PO TABS: 5.0000 mg | ORAL_TABLET | Freq: Once | ORAL | Status: DC | PRN

## 2020-11-04 MED ORDER — BUPIVACAINE HCL (PF) 0.5 % IJ SOLN
INTRAMUSCULAR | Status: DC | PRN
  Administered 2020-11-04: 13 mL via PERINEURAL
  Administered 2020-11-04: 7 mL via PERINEURAL

## 2020-11-04 MED ORDER — ONDANSETRON HCL 4 MG/2ML IJ SOLN
INTRAMUSCULAR | Status: AC
  Filled 2020-11-04: qty 2

## 2020-11-04 MED ORDER — PROPOFOL 10 MG/ML IV BOLUS
INTRAVENOUS | Status: AC
  Filled 2020-11-04: qty 20

## 2020-11-04 MED ORDER — MORPHINE SULFATE (PF) 2 MG/ML IV SOLN: 2.0000 mg | INTRAVENOUS | Status: DC | PRN

## 2020-11-04 MED ORDER — HEPARIN SODIUM (PORCINE) 1000 UNIT/ML IJ SOLN
INTRAMUSCULAR | Status: DC | PRN
  Administered 2020-11-04: 3000 [IU] via INTRAVENOUS

## 2020-11-04 MED ORDER — OXYCODONE HCL 5 MG/5ML PO SOLN: 5.0000 mg | Freq: Once | ORAL | Status: DC | PRN

## 2020-11-04 MED ORDER — PAPAVERINE HCL 30 MG/ML IJ SOLN
INTRAMUSCULAR | Status: AC
  Filled 2020-11-04: qty 2

## 2020-11-04 MED ORDER — MIDAZOLAM HCL 2 MG/2ML IJ SOLN: 1.0000 mg | Freq: Once | INTRAMUSCULAR | Status: AC

## 2020-11-04 MED ORDER — HEPARIN SODIUM (PORCINE) 1000 UNIT/ML IJ SOLN
INTRAMUSCULAR | Status: AC
  Filled 2020-11-04: qty 1

## 2020-11-04 MED ORDER — PROPOFOL 500 MG/50ML IV EMUL
INTRAVENOUS | Status: AC
  Filled 2020-11-04: qty 50

## 2020-11-04 MED ORDER — CHLORHEXIDINE GLUCONATE CLOTH 2 % EX PADS
6.0000 | MEDICATED_PAD | Freq: Once | CUTANEOUS | Status: AC
  Administered 2020-11-04: 6 via TOPICAL

## 2020-11-04 MED ORDER — FENTANYL CITRATE (PF) 100 MCG/2ML IJ SOLN
INTRAMUSCULAR | Status: AC
  Administered 2020-11-04: 50 ug via INTRAVENOUS
  Filled 2020-11-04: qty 2

## 2020-11-04 SURGICAL SUPPLY — 54 items
BAG DECANTER FOR FLEXI CONT (MISCELLANEOUS) ×2 IMPLANT
BLADE SURG SZ11 CARB STEEL (BLADE) ×2 IMPLANT
BOOT SUTURE AID YELLOW STND (SUTURE) ×2 IMPLANT
BRUSH SCRUB EZ  4% CHG (MISCELLANEOUS) ×1
BRUSH SCRUB EZ 4% CHG (MISCELLANEOUS) ×1 IMPLANT
CANISTER SUCT 1200ML W/VALVE (MISCELLANEOUS) ×1 IMPLANT
CHLORAPREP W/TINT 26 (MISCELLANEOUS) ×3 IMPLANT
CLIP SPRNG 6 S-JAW DBL (CLIP) ×1 IMPLANT
CLIP SPRNG 6MM S-JAW DBL (CLIP) ×2
COVER WAND RF STERILE (DRAPES) ×2 IMPLANT
DERMABOND ADVANCED (GAUZE/BANDAGES/DRESSINGS) ×1
DERMABOND ADVANCED .7 DNX12 (GAUZE/BANDAGES/DRESSINGS) ×1 IMPLANT
ELECT CAUTERY BLADE 6.4 (BLADE) ×2 IMPLANT
ELECT REM PT RETURN 9FT ADLT (ELECTROSURGICAL) ×2
ELECTRODE REM PT RTRN 9FT ADLT (ELECTROSURGICAL) ×1 IMPLANT
GEL ULTRASOUND 20GR AQUASONIC (MISCELLANEOUS) IMPLANT
GLOVE BIO SURGEON STRL SZ7 (GLOVE) ×4 IMPLANT
GLOVE INDICATOR 7.5 STRL GRN (GLOVE) ×2 IMPLANT
GOWN STRL REUS W/ TWL LRG LVL3 (GOWN DISPOSABLE) ×2 IMPLANT
GOWN STRL REUS W/ TWL XL LVL3 (GOWN DISPOSABLE) ×1 IMPLANT
GOWN STRL REUS W/TWL LRG LVL3 (GOWN DISPOSABLE) ×2
GOWN STRL REUS W/TWL XL LVL3 (GOWN DISPOSABLE) ×1
HEMOSTAT SURGICEL 2X3 (HEMOSTASIS) ×2 IMPLANT
IV NS 500ML (IV SOLUTION) ×1
IV NS 500ML BAXH (IV SOLUTION) ×1 IMPLANT
KIT TURNOVER KIT A (KITS) ×2 IMPLANT
LABEL OR SOLS (LABEL) ×2 IMPLANT
LOOP RED MAXI  1X406MM (MISCELLANEOUS) ×1
LOOP VESSEL MAXI 1X406 RED (MISCELLANEOUS) ×1 IMPLANT
LOOP VESSEL MINI 0.8X406 BLUE (MISCELLANEOUS) ×1 IMPLANT
LOOPS BLUE MINI 0.8X406MM (MISCELLANEOUS) ×1
MANIFOLD NEPTUNE II (INSTRUMENTS) ×2 IMPLANT
NDL FILTER BLUNT 18X1 1/2 (NEEDLE) ×1 IMPLANT
NEEDLE FILTER BLUNT 18X 1/2SAF (NEEDLE) ×1
NEEDLE FILTER BLUNT 18X1 1/2 (NEEDLE) ×1 IMPLANT
NS IRRIG 500ML POUR BTL (IV SOLUTION) ×2 IMPLANT
PACK EXTREMITY (MISCELLANEOUS) ×2 IMPLANT
PAD PREP 24X41 OB/GYN DISP (PERSONAL CARE ITEMS) ×2 IMPLANT
SOLUTION CELL SAVER (CLIP) ×1 IMPLANT
STOCKINETTE 48X4 2 PLY STRL (GAUZE/BANDAGES/DRESSINGS) ×1 IMPLANT
STOCKINETTE STRL 4IN 9604848 (GAUZE/BANDAGES/DRESSINGS) ×2 IMPLANT
SUT MNCRL AB 4-0 PS2 18 (SUTURE) ×2 IMPLANT
SUT PROLENE 6 0 BV (SUTURE) ×7 IMPLANT
SUT SILK 2 0 (SUTURE) ×1
SUT SILK 2-0 18XBRD TIE 12 (SUTURE) ×1 IMPLANT
SUT SILK 3 0 (SUTURE) ×1
SUT SILK 3-0 18XBRD TIE 12 (SUTURE) ×1 IMPLANT
SUT SILK 4 0 (SUTURE) ×1
SUT SILK 4-0 18XBRD TIE 12 (SUTURE) ×1 IMPLANT
SUT VIC AB 3-0 SH 27 (SUTURE) ×1
SUT VIC AB 3-0 SH 27X BRD (SUTURE) ×1 IMPLANT
SYR 20ML LL LF (SYRINGE) ×2 IMPLANT
SYR 3ML LL SCALE MARK (SYRINGE) ×2 IMPLANT
SYR TB 1ML 27GX1/2 LL (SYRINGE) IMPLANT

## 2020-11-04 NOTE — Treatment Plan (Signed)
Per Dr. Lucky Cowboy, ok to give BP Medications since she is NPO. Sent via instant message. Verified Lantus dose with MD Priscella Mann, he stated to give 7 units not 15 that is ordered via instant message.

## 2020-11-04 NOTE — Op Note (Signed)
[facility]   OPERATIVE NOTE   PROCEDURE: Left brachiocephalic arteriovenous fistula placement  PRE-OPERATIVE DIAGNOSIS: 1.  ESRD        POST-OPERATIVE DIAGNOSIS: 1. ESRD       SURGEON: [name], MD  ASSISTANT(S): Hezzie Bump, PA-C  ANESTHESIA: general  ESTIMATED BLOOD LOSS: 10 cc  FINDING(S): Adequate cephalic vein for fistula creation  SPECIMEN(S):  none  INDICATIONS:   Renee [name] is a 44 y.o. female who presents with renal failure in need of pemanent dialysis acces.  The patient is scheduled for left arm AVF placement.  The patient is aware the risks include but are not limited to: bleeding, infection, steal syndrome, nerve damage, ischemic monomelic neuropathy, failure to mature, and need for additional procedures.  The patient is aware of the risks of the procedure and elects to proceed forward. An assistant was present during the procedure to help facilitate the exposure and expedite the procedure.  DESCRIPTION: After full informed written consent was obtained from the patient, the patient was brought back to the operating room and placed supine upon the operating table.  Prior to induction, the patient received IV antibiotics.   After obtaining adequate anesthesia, the patient was then prepped and draped in the standard fashion for a left arm access procedure. The assistant provided retraction and mobilization to help facilitate exposure and expedite the procedure throughout the entire procedure.  This included following suture, using retractors, and optimizing lighting.  I made a curvilinear incision at the level of the antecubital fossa and dissected through the subcutaneous tissue and fascia to gain exposure of the brachial artery.  This was noted to be patent and adequate in size for fistula creation.  This was dissected out proximally and distally and prepared for control with vessel loops .  I then dissected out the cephalic vein.  This was  noted to be patent and adequate in size for fistula creation.  I then gave the patient 3000 units of intravenous heparin.  The vein was marked for orientation and the distal segment of the vein was ligated with a  2-0 silk, and the vein was transected.  I then instilled the heparinized saline into the vein and clamped it.  At this point, I reset my exposure of the brachial artery and pulled up control on the vessel loops.  I made an arteriotomy with a #11 blade, and then I extended the arteriotomy with a Potts scissor.  I injected heparinized saline proximal and distal to this arteriotomy.  The vein was then sewn to the artery in an end-to-side configuration with a running stitch of 6-0 Prolene.  Prior to completing this anastomosis, I allowed the vein and artery to backbleed.  There was no evidence of clot from any vessels.  I completed the anastomosis in the usual fashion and then released all vessel loops and clamps.  There was a palpable  thrill in the venous outflow, and there was a palpable pulse in the artery distal to the anastomosis.  At this point, I irrigated out the surgical wound.  Surgicel was placed. There was no further active bleeding.  The subcutaneous tissue was reapproximated with a running stitch of 3-0 Vicryl.  The skin was then closed with a 4-0 Monocryl suture.  The skin was then cleaned, dried, and reinforced with Dermabond.  The patient tolerated this procedure well and was taken to the recovery room in stable condition  COMPLICATIONS: None  CONDITION: Stable   [name]  11/04/2020, 1:43 PM  This note was created with Dragon Medical transcription system. Any errors in dictation are purely unintentional.

## 2020-11-04 NOTE — Progress Notes (Signed)
Initial Nutrition Assessment  DOCUMENTATION CODES:   Obesity unspecified  INTERVENTION:   Nepro Shake po BID, each supplement provides 425 kcal and 19 grams protein  Rena-vit po daily   Renal diet education   NUTRITION DIAGNOSIS:   Increased nutrient needs related to chronic illness (ESRD on HD) as evidenced by increased estimated needs.  GOAL:   Patient will meet greater than or equal to 90% of their needs  MONITOR:   PO intake, Supplement acceptance, Labs, Weight trends, Skin, I & O's  REASON FOR ASSESSMENT:   Consult Diet education  ASSESSMENT:   44 y.o. black female with end stage renal disease on hemodialysis, hypertension and insulin dependent diabetes mellitus who is admitted to [facility] on 10/31/2020 for hypertensive urgency   Met with pt in room today. Pt reports poor appetite and oral intake pta and in hospital. Pt documented to be eating 25% of meals yesterday. Pt NPO for AV graft placement today. RD provided pt with renal diet education today. RD also discussed with pt the importance of adequate protein intake needed to preserve lean muscle. Pt is willing to try mixed berry Nepro. RD will add supplements and vitamins to help pt meet her estimated needs and replace losses from HD. Per chart, pt down 9lbs(4%) from her UBW. Pt reports that her weight remains fairly weight stable at baseline.   Medications reviewed and include: aspirin, oscal, heparin, insulin, protonix, torsemide, vitamin D  Labs reviewed: creat 4.20(H) P 3.7 wnl- 11/3 Mg 1.9 wnl- 11/4 Hgb 8.5(L), Hct 28.2(L) cbgs- 110, 106 x 24hrs AIC 5.9(H)- 10/22  NUTRITION - FOCUSED PHYSICAL EXAM:    Most Recent Value  Orbital Region No depletion  Upper Arm Region No depletion  Thoracic and Lumbar Region No depletion  Buccal Region No depletion  Temple Region No depletion  Clavicle Bone Region No depletion  Clavicle and Acromion Bone Region No depletion  Scapular Bone Region No depletion  Dorsal Hand  No depletion  Patellar Region No depletion  Anterior Thigh Region No depletion  Posterior Calf Region No depletion  Edema (RD Assessment) Mild  Hair Reviewed  Eyes Reviewed  Mouth Reviewed  Skin Reviewed  Nails Reviewed     Diet Order:   Diet Order            Diet NPO time specified  Diet effective now                EDUCATION NEEDS:   Education needs have been addressed  Skin:  Skin Assessment: Reviewed RN Assessment (ecchymosis)  Last BM:  11/3  Height:   Ht Readings from Last 1 Encounters:  11/04/20 5' 5"  (1.651 m)    Weight:   Wt Readings from Last 1 Encounters:  11/04/20 106.5 kg    Ideal Body Weight:  56.8 kg  BMI:  Body mass index is 39.07 kg/m.  Estimated Nutritional Needs:   Kcal:  2200-2500kcal/day  Protein:  110-125g/day  Fluid:  UOP +1L  Koleen Distance MS, RD, LDN Please refer to [facility] for RD and/or RD on-call/weekend/after hours pager

## 2020-11-04 NOTE — Progress Notes (Signed)
[facility]  ROUNDING NOTE   Subjective:   Patient is NPO for AV Fistula placement by the vascular team today. She reports feeling better, in no acute distress.  Objective:  Vital signs in last 24 hours:  Temp:  [97.4 F (36.3 C)-98.7 F (37.1 C)] 97.7 F (36.5 C) (11/05 1600) Pulse Rate:  [70-81] 79 (11/05 1527) Resp:  [12-25] 21 (11/05 1511) BP: (118-169)/(62-96) 124/68 (11/05 1600) SpO2:  [90 %-100 %] 99 % (11/05 1600) Weight:  [106.5 kg] 106.5 kg (11/05 1138)  Weight change:  Filed Weights   11/02/20 0153 11/03/20 0431 11/04/20 1138  Weight: 112 kg 106.5 kg 106.5 kg    Intake/Output: I/O last 3 completed shifts: In: 120 [P.O.:120] Out: 8150 [Urine:1650; Other:6500]   Intake/Output this shift:  Total I/O In: 50 [IV Piggyback:50] Out: 10 [Blood:10]  Physical Exam: General: Resting in bed, appears calm and comfortable  Head: Normocephalic,Atraumatic  Eyes: Sclerae and conjunctivae clear  Lungs:   Normal and symmetrical effort, lungs clear  Heart:  Regular rate and rhythm   Abdomen:   Non distended,non tender  Extremities:  Trace peripheral edema  Neurologic:  Alert, awake,Oriented x3  Skin: No acute lesions or rashes  Access:  Rt IJ Permcath    Basic Metabolic Panel: Recent Labs  Lab 10/31/20 1229 10/31/20 1229 11/02/20 0734 11/02/20 0734 11/02/20 1626 11/03/20 1150 11/04/20 0432  NA 143  --  139  --  139 138 138  K 3.5  --  3.5  --  3.2* 3.1* 3.5  CL 105  --  101  --  99 98 98  CO2 24  --  27  --  29 29 29   GLUCOSE 126*  --  95  --  114* 110* 110*  BUN 49*  --  34*  --  29* 25* 19  CREATININE 7.68*  --  6.94*  --  5.54* 5.44* 4.20*  CALCIUM 9.0   < > 8.4*   < > 8.5* 8.4* 8.6*  MG  --   --   --   --   --  1.9  --   PHOS  --   --   --   --  3.7  --   --    < > = values in this interval not displayed.    Liver Function Tests: Recent Labs  Lab 10/31/20 1229 11/02/20 1626  AST 26  --   ALT 29  --   ALKPHOS 142*  --   BILITOT  0.9  --   PROT 7.8  --   ALBUMIN 3.8 3.4*   No results for input(s): LIPASE, AMYLASE in the last 168 hours. No results for input(s): AMMONIA in the last 168 hours.  CBC: Recent Labs  Lab 10/31/20 1229 10/31/20 2326 11/02/20 0734 11/02/20 1626 11/03/20 1150  WBC 11.3* 9.1 6.1 7.5 6.6  NEUTROABS 9.8*  --  4.6  --   --   HGB 9.2* 8.8* 7.9* 8.3* 8.5*  HCT 31.2* 30.1* 26.8* 28.2* 28.2*  MCV 80.6 80.5 81.5 80.8 80.1  PLT 266 228 188 223 204    Cardiac Enzymes: No results for input(s): CKTOTAL, CKMB, CKMBINDEX, TROPONINI in the last 168 hours.  BNP: Invalid input(s): POCBNP  CBG: Recent Labs  Lab 11/03/20 2120 11/04/20 0749 11/04/20 1111 11/04/20 1401 11/04/20 1614  GLUCAP 153* 110* 106* 116* 112*    Microbiology: Results for orders placed or performed during the hospital encounter of 10/31/20  Respiratory Panel by RT  PCR (Flu A&B, Covid) - Nasopharyngeal Swab     Status: None   Collection Time: 10/31/20  1:02 PM   Specimen: Nasopharyngeal Swab  Result Value Ref Range Status   SARS Coronavirus 2 by RT PCR NEGATIVE NEGATIVE Final    Comment: (NOTE) SARS-CoV-2 target nucleic acids are NOT DETECTED.  The SARS-CoV-2 RNA is generally detectable in upper respiratoy specimens during the acute phase of infection. The lowest concentration of SARS-CoV-2 viral copies this assay can detect is 131 copies/mL. A negative result does not preclude SARS-Cov-2 infection and should not be used as the sole basis for treatment or other patient management decisions. A negative result may occur with  improper specimen collection/handling, submission of specimen other than nasopharyngeal swab, presence of viral mutation(s) within the areas targeted by this assay, and inadequate number of viral copies (<131 copies/mL). A negative result must be combined with clinical observations, patient history, and epidemiological information. The expected result is Negative.  Fact Sheet for  Patients:  PinkCheek.be  Fact Sheet for Healthcare Providers:  GravelBags.it  This test is no t yet approved or cleared by the [place] FDA and  has been authorized for detection and/or diagnosis of SARS-CoV-2 by FDA under an Emergency Use Authorization (EUA). This EUA will remain  in effect (meaning this test can be used) for the duration of the COVID-19 declaration under Section 564(b)(1) of the Act, 21 U.S.C. section 360bbb-3(b)(1), unless the authorization is terminated or revoked sooner.     Influenza A by PCR NEGATIVE NEGATIVE Final   Influenza B by PCR NEGATIVE NEGATIVE Final    Comment: (NOTE) The Xpert Xpress SARS-CoV-2/FLU/RSV assay is intended as an aid in  the diagnosis of influenza from Nasopharyngeal swab specimens and  should not be used as a sole basis for treatment. Nasal washings and  aspirates are unacceptable for Xpert Xpress SARS-CoV-2/FLU/RSV  testing.  Fact Sheet for Patients: PinkCheek.be  Fact Sheet for Healthcare Providers: GravelBags.it  This test is not yet approved or cleared by the [place] FDA and  has been authorized for detection and/or diagnosis of SARS-CoV-2 by  FDA under an Emergency Use Authorization (EUA). This EUA will remain  in effect (meaning this test can be used) for the duration of the  Covid-19 declaration under Section 564(b)(1) of the Act, 21  U.S.C. section 360bbb-3(b)(1), unless the authorization is  terminated or revoked. Performed at [facility], [address] [place], [place] [address]     Coagulation Studies: Recent Labs    11/03/20 1150  LABPROT 13.9  INR 1.1    Urinalysis: No results for input(s): COLORURINE, LABSPEC, PHURINE, GLUCOSEU, HGBUR, BILIRUBINUR, KETONESUR, PROTEINUR, UROBILINOGEN, NITRITE, LEUKOCYTESUR in the last 72 hours.  Invalid input(s): APPERANCEUR     Imaging: [place] OR NERVE BLOCK-IMAGE ONLY [facility])  Result Date: 11/04/2020 There is no interpretation for this exam.  This order is for images obtained during a surgical procedure.  Please See "Surgeries" Tab for more information regarding the procedure.     Medications:   . sodium chloride    . sodium chloride 10 mL/hr at 11/04/20 1229   . amLODipine  10 mg Oral Daily  . aspirin EC  81 mg Oral Daily  . atorvastatin  10 mg Oral Daily  . calcium carbonate  1,250 mg Oral BID WC  . Chlorhexidine Gluconate Cloth  6 each Topical Q0600  . Chlorhexidine Gluconate Cloth  6 each Topical Once  . cloNIDine  0.2 mg Oral TID  .  feeding supplement (NEPRO CARB STEADY)  237 mL Oral BID BM  . heparin  5,000 Units Subcutaneous Q8H  . hydrALAZINE  50 mg Oral Q6H  . insulin aspart  0-5 Units Subcutaneous QHS  . insulin aspart  0-6 Units Subcutaneous TID WC  . insulin aspart  2 Units Subcutaneous TID WC  . insulin glargine  7 Units Subcutaneous Daily  . irbesartan  300 mg Oral Daily  . isosorbide mononitrate  120 mg Oral Daily  . ketotifen  1 drop Both Eyes BID  . labetalol  300 mg Oral BID  . lidocaine (PF)      . multivitamin  1 tablet Oral QHS  . pantoprazole  40 mg Oral BID  . sodium chloride flush  3 mL Intravenous Q12H  . torsemide  100 mg Oral Once per day on Tue Thu Sat  . [START ON 11/06/2020] Vitamin D (Ergocalciferol)  50,000 Units Oral Q7 days   sodium chloride, acetaminophen **OR** acetaminophen, calcium carbonate, camphor-menthol **AND** hydrOXYzine, docusate sodium, hydrALAZINE, HYDROcodone-acetaminophen, labetalol, morphine injection, nitroGLYCERIN, sodium chloride flush, sorbitol, zolpidem  Assessment/ Plan  [name] is a 44 y.o. black female with end stage renal disease on hemodialysis, hypertension, insulin dependent diabetes mellitus, who is admitted to [facility] on 10/31/2020 for Hypertensive urgency [I16.0] Elevated brain natriuretic peptide (BNP) level [R79.89] Acute  respiratory failure with hypoxia ([place]) [J96.01] Troponin I above reference range [R77.8] Hypervolemia, unspecified hypervolemia type [E87.70]  [facility] M[facility] RIJ permcath 115kg  1. End Stage Renal Disease Patient received dialysis treatment yesterday Volume and electrolyte status acceptable No need for additional treatment She is NPO for vascular procedure, AVF placement today  2. Hypertension: with hypertensive emergency on admission.    Normotensive with current regimen of antihypertensives  3. Anemia with chronic kidney disease: Hemoglobin 8.5 Will start Epogen with dialysis   4. Secondary Hyperparathyroidism Calcium 8.6 Phosphorus 3.7 on 11/02/2020 No acute indication for Phos binders  Crosby Oyster, NP [facility]  11/5/20214:56 PM

## 2020-11-04 NOTE — Treatment Plan (Signed)
SCD on and placed on patient at this time. Pt given SQ insulin Lantus. Report called to [facility].

## 2020-11-04 NOTE — Progress Notes (Signed)
PROGRESS NOTE    Renee [name]  VXB:[id number] DOB: 12-21-1976 DOA: 10/31/2020 PCP: Minda Ditto, MD    Brief Narrative:  44 year old female known history of CKD stage V, hypertension, diabetes admitted for worsening shortness of breath.  Patient has a recent hemodialysis started 10/24/2020.  On presentation she was markedly hypertensive requiring nicardipine infusion.  This been weaned off.  Patient transferred to general medical floor on 11/01/2020 for further management.  Patient underwent hemodialysis on 11/02/2020.  Has some issues during dialysis with hypertension and malaise.  Blood pressure remains uncontrolled.  Scheduled for another dialysis run 11/03/2020.  Blood pressure regimen adjusted per nephrology.  11/4: Patient seen and examined.  N.p.o. for dialysis access creation today.  Blood pressure improved control over interval.   Assessment & Plan:   Principal Problem:   Hypertensive urgency Active Problems:   Diabetes mellitus, type 2 (HCC)   Chronic diastolic heart failure with preserved ejection fraction (HCC)   Anemia   ESRD (end stage renal disease) on dialysis [facility])  End-stage renal disease requiring hemodialysis Patient was recently discharged last week and was started on hemodialysis on 10/25 Nephrology following for inpatient HD needs Plan for fistula versus AV graft 11/04/2020 Nephrology and vascular on board  Hypertensive urgency Was started on nicardipine drip for blood pressure of 222/124 for which she required stepdown The Cardene drip is been weaned down the patient's blood pressure control remain suboptimal Plan: Oral blood pressure regimen adjusted per nephrology We will continue to monitor blood pressure and adjust medications as needed Dialysis per nephrology  Anemia of chronic kidney disease Hemoglobin appears stable in the mid eights.  No obvious bleeding noted.  Type 2 diabetes with CKD 5 Lantus 15 units subcu daily Consult diabetic  nurse, last hemoglobin A1c was 5.9 (Half dose Lantus, 7 units given before operating room given n.p.o. status)  Morbid obesity Body mass index is 44.76 kg/m. This complicates overall prognosis and care   DVT prophylaxis: Subcutaneous heparin Code Status: Full Family Communication: Spouse at bedside 11/04/2020 Disposition Plan: Status is: Inpatient  Remains inpatient appropriate because:IV treatments appropriate due to intensity of illness or inability to take PO   Dispo:  Patient From: Home  Planned Disposition: Home  Expected discharge date: 11/05/20  Medically stable for discharge: No  Plan for dialysis access creation today.  Possible discharge tomorrow 11/05/2020 if blood pressure control improved.       Consultants:   Nephrology  Procedures:   None  Antimicrobials:   None   Subjective: Patient seen and examined.  No acute distress this morning.  N.p.o. for procedure  Objective: Vitals:   11/04/20 0416 11/04/20 0746 11/04/20 0747 11/04/20 1118  BP: (!) 155/76 140/73  140/80  Pulse: 79 81  73  Resp: 20 20    Temp: 98.2 F (36.8 C) 98.7 F (37.1 C)  98 F (36.7 C)  TempSrc: Oral Oral  Oral  SpO2: 96% 100% 95% 95%  Weight:      Height:        Intake/Output Summary (Last 24 hours) at 11/04/2020 1123 Last data filed at 11/04/2020 0900 Gross per 24 hour  Intake 120 ml  Output 3500 ml  Net -3380 ml   Filed Weights   10/31/20 1218 11/02/20 0153 11/03/20 0431  Weight: 122 kg 112 kg 106.5 kg    Examination:  General exam: Tearful Respiratory system: Clear to auscultation.  Room air.  Normal work of breathing Cardiovascular system: S1 & S2 heard, RRR. No  JVD, murmurs, rubs, gallops or clicks. No pedal edema. Gastrointestinal system: Abdomen is nondistended, soft and nontender. No organomegaly or masses felt. Normal bowel sounds heard. Central nervous system: Alert and oriented. No focal neurological deficits. Extremities: Symmetric 5 x 5  power. Skin: No obvious rashes or lesions.  Right IJ PermCath Psychiatry: Judgement and insight appear normal. Mood & affect appropriate.     Data Reviewed: I have personally reviewed following labs and imaging studies  CBC: Recent Labs  Lab 10/31/20 1229 10/31/20 2326 11/02/20 0734 11/02/20 1626 11/03/20 1150  WBC 11.3* 9.1 6.1 7.5 6.6  NEUTROABS 9.8*  --  4.6  --   --   HGB 9.2* 8.8* 7.9* 8.3* 8.5*  HCT 31.2* 30.1* 26.8* 28.2* 28.2*  MCV 80.6 80.5 81.5 80.8 80.1  PLT 266 228 188 223 400   Basic Metabolic Panel: Recent Labs  Lab 10/31/20 1229 11/02/20 0734 11/02/20 1626 11/03/20 1150 11/04/20 0432  NA 143 139 139 138 138  K 3.5 3.5 3.2* 3.1* 3.5  CL 105 101 99 98 98  CO2 24 27 29 29 29   GLUCOSE 126* 95 114* 110* 110*  BUN 49* 34* 29* 25* 19  CREATININE 7.68* 6.94* 5.54* 5.44* 4.20*  CALCIUM 9.0 8.4* 8.5* 8.4* 8.6*  MG  --   --   --  1.9  --   PHOS  --   --  3.7  --   --    GFR: Estimated Creatinine Clearance: 20.5 mL/min (A) (by C-G formula based on SCr of 4.2 mg/dL (H)). Liver Function Tests: Recent Labs  Lab 10/31/20 1229 11/02/20 1626  AST 26  --   ALT 29  --   ALKPHOS 142*  --   BILITOT 0.9  --   PROT 7.8  --   ALBUMIN 3.8 3.4*   No results for input(s): LIPASE, AMYLASE in the last 168 hours. No results for input(s): AMMONIA in the last 168 hours. Coagulation Profile: Recent Labs  Lab 11/03/20 1150  INR 1.1   Cardiac Enzymes: No results for input(s): CKTOTAL, CKMB, CKMBINDEX, TROPONINI in the last 168 hours. BNP (last 3 results) No results for input(s): PROBNP in the last 8760 hours. HbA1C: No results for input(s): HGBA1C in the last 72 hours. CBG: Recent Labs  Lab 11/03/20 0751 11/03/20 1651 11/03/20 2120 11/04/20 0749 11/04/20 1111  GLUCAP 95 93 153* 110* 106*   Lipid Profile: No results for input(s): CHOL, HDL, LDLCALC, TRIG, CHOLHDL, LDLDIRECT in the last 72 hours. Thyroid Function Tests: No results for input(s): TSH,  T4TOTAL, FREET4, T3FREE, THYROIDAB in the last 72 hours. Anemia Panel: No results for input(s): VITAMINB12, FOLATE, FERRITIN, TIBC, IRON, RETICCTPCT in the last 72 hours. Sepsis Labs: No results for input(s): PROCALCITON, LATICACIDVEN in the last 168 hours.  Recent Results (from the past 240 hour(s))  Respiratory Panel by RT PCR (Flu A&B, Covid) - Nasopharyngeal Swab     Status: None   Collection Time: 10/31/20  1:02 PM   Specimen: Nasopharyngeal Swab  Result Value Ref Range Status   SARS Coronavirus 2 by RT PCR NEGATIVE NEGATIVE Final    Comment: (NOTE) SARS-CoV-2 target nucleic acids are NOT DETECTED.  The SARS-CoV-2 RNA is generally detectable in upper respiratoy specimens during the acute phase of infection. The lowest concentration of SARS-CoV-2 viral copies this assay can detect is 131 copies/mL. A negative result does not preclude SARS-Cov-2 infection and should not be used as the sole basis for treatment or other patient management decisions. A  negative result may occur with  improper specimen collection/handling, submission of specimen other than nasopharyngeal swab, presence of viral mutation(s) within the areas targeted by this assay, and inadequate number of viral copies (<131 copies/mL). A negative result must be combined with clinical observations, patient history, and epidemiological information. The expected result is Negative.  Fact Sheet for Patients:  PinkCheek.be  Fact Sheet for Healthcare Providers:  GravelBags.it  This test is no t yet approved or cleared by the [place] FDA and  has been authorized for detection and/or diagnosis of SARS-CoV-2 by FDA under an Emergency Use Authorization (EUA). This EUA will remain  in effect (meaning this test can be used) for the duration of the COVID-19 declaration under Section 564(b)(1) of the Act, 21 U.S.C. section 360bbb-3(b)(1), unless the authorization  is terminated or revoked sooner.     Influenza A by PCR NEGATIVE NEGATIVE Final   Influenza B by PCR NEGATIVE NEGATIVE Final    Comment: (NOTE) The Xpert Xpress SARS-CoV-2/FLU/RSV assay is intended as an aid in  the diagnosis of influenza from Nasopharyngeal swab specimens and  should not be used as a sole basis for treatment. Nasal washings and  aspirates are unacceptable for Xpert Xpress SARS-CoV-2/FLU/RSV  testing.  Fact Sheet for Patients: PinkCheek.be  Fact Sheet for Healthcare Providers: GravelBags.it  This test is not yet approved or cleared by the [place] FDA and  has been authorized for detection and/or diagnosis of SARS-CoV-2 by  FDA under an Emergency Use Authorization (EUA). This EUA will remain  in effect (meaning this test can be used) for the duration of the  Covid-19 declaration under Section 564(b)(1) of the Act, 21  U.S.C. section 360bbb-3(b)(1), unless the authorization is  terminated or revoked. Performed at [facility], [address] [place], [place] [id number]          Radiology Studies: [place] UE VEIN MAPPING LEFT (PRE-OP AVF)  Result Date: 11/03/2020 CLINICAL DATA:  End-stage renal disease and planned dialysis fistula versus graft placement in the left upper extremity. EXAM: [place] EXTREM UP VEIN MAPPING COMPARISON:  None. FINDINGS: LEFT ARTERIES Wrist Radial Artery: Size 1.15mm Waveform triphasic Wrist Ulnar Artery: Size 1.19mm Waveform triphasic Prox. Forearm Radial Artery: Size 1.60mm Waveform triphasic Upper Arm Brachial Artery: Size 3.50mm Waveform triphasic LEFT VEINS Forearm Cephalic Vein: Prox 6.7MC Distal 1.7mm Depth 9.4BS Upper Arm Cephalic Vein: Prox 9.6GE Distal 5.51mm Depth 3.6OQ Upper Arm Basilic Vein: Prox 9.4TM Distal 5.84mm Depth 1.66mm Upper Arm Brachial Vein: Prox 7.23mm Distal 3.109mm Depth 2.9mm ADDITIONAL LEFT VEINS Axillary Vein:  8.41mm Subclavian Vein: Patient: Yes  respiratory Phasicity: Present Internal Jugular Vein: Patent: Yes respiratory Phasicity: Present Branches > 2 mm: There are 2 separate proximal upper arm cephalic vein branches measuring 2.6 and 2.9 mm. Distal upper arm basilic vein branch measures 3 mm. IMPRESSION: Left upper extremity vein mapping characterization by ultrasound as above with arterial evaluation. Electronically Signed   By: Aletta Edouard M.D.   On: 11/03/2020 10:51        Scheduled Meds: . amLODipine  10 mg Oral Daily  . aspirin EC  81 mg Oral Daily  . atorvastatin  10 mg Oral Daily  . calcium carbonate  1,250 mg Oral BID WC  . Chlorhexidine Gluconate Cloth  6 each Topical Q0600  . cloNIDine  0.2 mg Oral TID  . heparin  5,000 Units Subcutaneous Q8H  . hydrALAZINE  50 mg Oral Q6H  . insulin aspart  0-5 Units Subcutaneous QHS  .  insulin aspart  0-6 Units Subcutaneous TID WC  . insulin aspart  2 Units Subcutaneous TID WC  . insulin glargine  7 Units Subcutaneous Daily  . irbesartan  300 mg Oral Daily  . isosorbide mononitrate  120 mg Oral Daily  . ketotifen  1 drop Both Eyes BID  . labetalol  300 mg Oral BID  . pantoprazole  40 mg Oral BID  . sodium chloride flush  3 mL Intravenous Q12H  . torsemide  100 mg Oral Once per day on Tue Thu Sat  . [START ON 11/06/2020] Vitamin D (Ergocalciferol)  50,000 Units Oral Q7 days   Continuous Infusions: . sodium chloride       LOS: 4 days    Time spent: 25 minutes    Sidney Ace, MD [facility] Pager [phone]  If 7PM-7AM, please contact night-coverage 11/04/2020, 11:23 AM

## 2020-11-04 NOTE — Anesthesia Preprocedure Evaluation (Signed)
Anesthesia Evaluation  Patient identified by MRN, date of birth, ID band Patient awake    Reviewed: Allergy & Precautions, H&P , NPO status , Patient's Chart, lab work & pertinent test results  History of Anesthesia Complications Negative for: history of anesthetic complications  Airway Mallampati: III  TM Distance: >3 FB Neck ROM: full    Dental  (+) Chipped   Pulmonary neg pulmonary ROS, neg shortness of breath,    Pulmonary exam normal        Cardiovascular Exercise Tolerance: Good hypertension, (-) angina+CHF  (-) Past MI Normal cardiovascular exam     Neuro/Psych neg Seizures negative neurological ROS  negative psych ROS   GI/Hepatic negative GI ROS, Neg liver ROS,   Endo/Other  diabetes, Type 2, Insulin Dependent  Renal/GU DialysisRenal disease  negative genitourinary   Musculoskeletal   Abdominal   Peds  Hematology negative hematology ROS (+)   Anesthesia Other Findings Past Medical History: No date: (HFpEF) heart failure with preserved ejection fraction (HCC) No date: CKD (chronic kidney disease), stage IV (HCC) No date: Diabetes mellitus without complication ([place]) No date: Hypertension No date: Morbid obesity ([place])  Past Surgical History: No date: CESAREAN SECTION 10/24/2020: DIALYSIS/PERMA CATHETER INSERTION; N/A     Comment:  Procedure: DIALYSIS/PERMA CATHETER INSERTION;  Surgeon:               Algernon Huxley, MD;  Location: [facility];                Service: Cardiovascular;  Laterality: N/A; No date: TUBAL LIGATION  BMI    Body Mass Index: 39.07 kg/m      Reproductive/Obstetrics negative OB ROS                             Anesthesia Physical Anesthesia Plan  ASA: IV  Anesthesia Plan: General   Post-op Pain Management: GA combined w/ Regional for post-op pain   Induction: Intravenous  PONV Risk Score and Plan: Propofol infusion and TIVA  Airway  Management Planned: Natural Airway and Nasal Cannula  Additional Equipment:   Intra-op Plan:   Post-operative Plan:   Informed Consent: I have reviewed the patients History and Physical, chart, labs and discussed the procedure including the risks, benefits and alternatives for the proposed anesthesia with the patient or authorized representative who has indicated his/her understanding and acceptance.     Dental Advisory Given  Plan Discussed with: Anesthesiologist, CRNA and Surgeon  Anesthesia Plan Comments: (Patient consented for risks of anesthesia including but not limited to:  - adverse reactions to medications - risk of intubation if required - damage to eyes, teeth, lips or other oral mucosa - nerve damage due to positioning  - sore throat or hoarseness - Damage to heart, brain, nerves, lungs, other parts of body or loss of life  Patient voiced understanding.)        Anesthesia Quick Evaluation

## 2020-11-04 NOTE — Progress Notes (Signed)
Mobility Specialist - Progress Note   11/04/20 1449  Mobility  Activity Contraindicated/medical hold  Mobility performed by Mobility specialist    Per chart review, pt currently OOF for procedure this date. Will attempt session at another date/time as pt becomes medically appropriate and available.    Kathee Delton Mobility Specialist 11/04/20, 2:50 PM

## 2020-11-04 NOTE — Anesthesia Procedure Notes (Signed)
Procedure Name: MAC Date/Time: 11/04/2020 12:34 PM Performed by: Jerrye Noble, CRNA Pre-anesthesia Checklist: Patient identified, Emergency Drugs available, Suction available and Patient being monitored Oxygen Delivery Method: Simple face mask

## 2020-11-04 NOTE — Anesthesia Procedure Notes (Signed)
Anesthesia Regional Block: Supraclavicular block   Pre-Anesthetic Checklist: ,, timeout performed, Correct Patient, Correct Site, Correct Laterality, Correct Procedure, Correct Position, site marked, Risks and benefits discussed,  Surgical consent,  Pre-op evaluation,  At surgeon's request and post-op pain management  Laterality: Upper and Left  Prep: chloraprep       Needles:  Injection technique: Single-shot  Needle Type: Stimiplex     Needle Length: 5cm  Needle Gauge: 22     Additional Needles:   Procedures:,,,, ultrasound used (permanent image in chart),,,,  Narrative:  Start time: 11/04/2020 12:08 PM End time: 11/04/2020 12:12 PM Injection made incrementally with aspirations every 5 mL.  Performed by: Personally  Anesthesiologist: Jaiden Wahab, Precious Haws, MD  Additional Notes: Patient consented for risk and benefits of nerve block including but not limited to nerve damage, failed block, bleeding and infection.  Patient voiced understanding.  Functioning IV was confirmed and monitors were applied.  Timeout done prior to procedure and prior to any sedation being given to the patient.  Patient confirmed procedure site prior to any sedation given to the patient.  A 72mm 22ga Stimuplex needle was used. Sterile prep,hand hygiene and sterile gloves were used.  Minimal sedation used for procedure.  No paresthesia endorsed by patient during the procedure.  Negative aspiration and negative test dose prior to incremental administration of local anesthetic. The patient tolerated the procedure well with no immediate complications.

## 2020-11-04 NOTE — Interval H&P Note (Signed)
History and Physical Interval Note:  11/04/2020 12:13 PM  Renee [name]  has presented today for surgery, with the diagnosis of End Stage Renal Disease.  The various methods of treatment have been discussed with the patient and family. After consideration of risks, benefits and other options for treatment, the patient has consented to  Procedure(s): ARTERIOVENOUS (AV) FISTULA CREATION (Left) as a surgical intervention.  The patient's history has been reviewed, patient examined, no change in status, stable for surgery.  I have reviewed the patient's chart and labs.  Questions were answered to the patient's satisfaction.     [name]

## 2020-11-04 NOTE — Transfer of Care (Signed)
Immediate Anesthesia Transfer of Care Note  Patient: Renee [name]  Procedure(s) Performed: ARTERIOVENOUS (AV) FISTULA CREATION (Left Arm Upper)  Patient Location: PACU  Anesthesia Type:General  Level of Consciousness: drowsy  Airway & Oxygen Therapy: Patient Spontanous Breathing and Patient connected to face mask oxygen  Post-op Assessment: Report given to RN and Post -op Vital signs reviewed and stable  Post vital signs: Reviewed and stable  Last Vitals:  Vitals Value Taken Time  BP 127/88 11/04/20 1359  Temp    Pulse 71 11/04/20 1359  Resp 13 11/04/20 1359  SpO2 99 % 11/04/20 1359  Vitals shown include unvalidated device data.  Last Pain:  Vitals:   11/04/20 1222  TempSrc:   PainSc: 0-No pain      Patients Stated Pain Goal: 0 (18/84/16 6063)  Complications: No complications documented.

## 2020-11-05 DIAGNOSIS — I160 Hypertensive urgency: Secondary | ICD-10-CM | POA: Diagnosis not present

## 2020-11-05 LAB — RENAL FUNCTION PANEL
Albumin: 3.1 g/dL — ABNORMAL LOW (ref 3.5–5.0)
Anion gap: 12 (ref 5–15)
BUN: 36 mg/dL — ABNORMAL HIGH (ref 6–20)
CO2: 28 mmol/L (ref 22–32)
Calcium: 8.1 mg/dL — ABNORMAL LOW (ref 8.9–10.3)
Chloride: 97 mmol/L — ABNORMAL LOW (ref 98–111)
Creatinine, Ser: 6.69 mg/dL — ABNORMAL HIGH (ref 0.44–1.00)
GFR, Estimated: 7 mL/min — ABNORMAL LOW (ref >60)
Glucose, Bld: 134 mg/dL — ABNORMAL HIGH (ref 70–99)
Phosphorus: 6.6 mg/dL — ABNORMAL HIGH (ref 2.5–4.6)
Potassium: 4.3 mmol/L (ref 3.5–5.1)
Sodium: 137 mmol/L (ref 135–145)

## 2020-11-05 LAB — GLUCOSE, CAPILLARY
Glucose-Capillary: 114 mg/dL — ABNORMAL HIGH (ref 70–99)
Glucose-Capillary: 142 mg/dL — ABNORMAL HIGH (ref 70–99)
Glucose-Capillary: 98 mg/dL (ref 70–99)

## 2020-11-05 LAB — CBC
HCT: 27.3 % — ABNORMAL LOW (ref 36.0–46.0)
Hemoglobin: 7.8 g/dL — ABNORMAL LOW (ref 12.0–15.0)
MCH: 23.9 pg — ABNORMAL LOW (ref 26.0–34.0)
MCHC: 28.6 g/dL — ABNORMAL LOW (ref 30.0–36.0)
MCV: 83.7 fL (ref 80.0–100.0)
Platelets: 203 10*3/uL (ref 150–400)
RBC: 3.26 MIL/uL — ABNORMAL LOW (ref 3.87–5.11)
RDW: 17.9 % — ABNORMAL HIGH (ref 11.5–15.5)
WBC: 7.8 10*3/uL (ref 4.0–10.5)
nRBC: 0 % (ref 0.0–0.2)

## 2020-11-05 MED ORDER — ISOSORBIDE MONONITRATE ER 120 MG PO TB24: 120.0000 mg | ORAL_TABLET | Freq: Every day | ORAL | 0 refills | Status: AC

## 2020-11-05 MED ORDER — RENA-VITE PO TABS: 1.0000 | ORAL_TABLET | Freq: Every day | ORAL | 0 refills | Status: AC

## 2020-11-05 MED ORDER — HYDRALAZINE HCL 50 MG PO TABS: 50.0000 mg | ORAL_TABLET | Freq: Four times a day (QID) | ORAL | 0 refills | Status: AC

## 2020-11-05 MED ORDER — IRBESARTAN 300 MG PO TABS: 300.0000 mg | ORAL_TABLET | Freq: Every day | ORAL | 0 refills | Status: AC

## 2020-11-05 MED ORDER — INSULIN GLARGINE 100 UNIT/ML ~~LOC~~ SOLN
15.0000 [IU] | Freq: Every day | SUBCUTANEOUS | Status: DC
  Administered 2020-11-05 – 2020-11-06 (×2): 15 [IU] via SUBCUTANEOUS
  Filled 2020-11-05 (×3): qty 0.15

## 2020-11-05 MED ORDER — LABETALOL HCL 300 MG PO TABS: 300.0000 mg | ORAL_TABLET | Freq: Two times a day (BID) | ORAL | 0 refills | Status: AC

## 2020-11-05 MED ORDER — PANTOPRAZOLE SODIUM 40 MG PO TBEC: 40.0000 mg | DELAYED_RELEASE_TABLET | Freq: Two times a day (BID) | ORAL | 0 refills | Status: AC

## 2020-11-05 MED ORDER — TORSEMIDE 100 MG PO TABS: 100.0000 mg | ORAL_TABLET | ORAL | 0 refills | Status: AC

## 2020-11-05 NOTE — Progress Notes (Signed)
[facility]  ROUNDING NOTE   Subjective:   Patient is doing fair today Denies any acute complaints or shortness of breath Primary team is discussing discharge to home today  Objective:  Vital signs in last 24 hours:  Temp:  [97.5 F (36.4 C)-98.4 F (36.9 C)] 98.2 F (36.8 C) (11/06 1153) Pulse Rate:  [72-79] 76 (11/06 1153) Resp:  [16-23] 20 (11/06 1153) BP: (116-148)/(64-78) 144/73 (11/06 1153) SpO2:  [83 %-99 %] 93 % (11/06 1153)  Weight change:  Filed Weights   11/02/20 0153 11/03/20 0431 11/04/20 1138  Weight: 112 kg 106.5 kg 106.5 kg    Intake/Output: I/O last 3 completed shifts: In: 218.8 [P.O.:120; I.V.:48.8; IV Piggyback:50] Out: 10 [Blood:10]   Intake/Output this shift:  No intake/output data recorded.  Physical Exam: General: Resting in bed, appears calm and comfortable  Head: Normocephalic,Atraumatic  Eyes: Sclerae and conjunctivae clear  Lungs:   Normal and symmetrical effort, lungs clear  Heart:  Regular rate and rhythm   Abdomen:   Non distended,non tender  Extremities:  Trace peripheral edema  Neurologic:  Alert, awake,Oriented x3  Skin: No acute lesions or rashes  Access:  Rt IJ Permcath    Basic Metabolic Panel: Recent Labs  Lab 10/31/20 1229 10/31/20 1229 11/02/20 0734 11/02/20 0734 11/02/20 1626 11/03/20 1150 11/04/20 0432  NA 143  --  139  --  139 138 138  K 3.5  --  3.5  --  3.2* 3.1* 3.5  CL 105  --  101  --  99 98 98  CO2 24  --  27  --  29 29 29   GLUCOSE 126*  --  95  --  114* 110* 110*  BUN 49*  --  34*  --  29* 25* 19  CREATININE 7.68*  --  6.94*  --  5.54* 5.44* 4.20*  CALCIUM 9.0   < > 8.4*   < > 8.5* 8.4* 8.6*  MG  --   --   --   --   --  1.9  --   PHOS  --   --   --   --  3.7  --   --    < > = values in this interval not displayed.    Liver Function Tests: Recent Labs  Lab 10/31/20 1229 11/02/20 1626  AST 26  --   ALT 29  --   ALKPHOS 142*  --   BILITOT 0.9  --   PROT 7.8  --   ALBUMIN 3.8  3.4*   No results for input(s): LIPASE, AMYLASE in the last 168 hours. No results for input(s): AMMONIA in the last 168 hours.  CBC: Recent Labs  Lab 10/31/20 1229 10/31/20 2326 11/02/20 0734 11/02/20 1626 11/03/20 1150  WBC 11.3* 9.1 6.1 7.5 6.6  NEUTROABS 9.8*  --  4.6  --   --   HGB 9.2* 8.8* 7.9* 8.3* 8.5*  HCT 31.2* 30.1* 26.8* 28.2* 28.2*  MCV 80.6 80.5 81.5 80.8 80.1  PLT 266 228 188 223 204    Cardiac Enzymes: No results for input(s): CKTOTAL, CKMB, CKMBINDEX, TROPONINI in the last 168 hours.  BNP: Invalid input(s): POCBNP  CBG: Recent Labs  Lab 11/04/20 1401 11/04/20 1614 11/04/20 2111 11/05/20 0758 11/05/20 1146  GLUCAP 116* 112* 109* 114* 142*    Microbiology: Results for orders placed or performed during the hospital encounter of 10/31/20  Respiratory Panel by RT PCR (Flu A&B, Covid) - Nasopharyngeal Swab  Status: None   Collection Time: 10/31/20  1:02 PM   Specimen: Nasopharyngeal Swab  Result Value Ref Range Status   SARS Coronavirus 2 by RT PCR NEGATIVE NEGATIVE Final    Comment: (NOTE) SARS-CoV-2 target nucleic acids are NOT DETECTED.  The SARS-CoV-2 RNA is generally detectable in upper respiratoy specimens during the acute phase of infection. The lowest concentration of SARS-CoV-2 viral copies this assay can detect is 131 copies/mL. A negative result does not preclude SARS-Cov-2 infection and should not be used as the sole basis for treatment or other patient management decisions. A negative result may occur with  improper specimen collection/handling, submission of specimen other than nasopharyngeal swab, presence of viral mutation(s) within the areas targeted by this assay, and inadequate number of viral copies (<131 copies/mL). A negative result must be combined with clinical observations, patient history, and epidemiological information. The expected result is Negative.  Fact Sheet for Patients:   PinkCheek.be  Fact Sheet for Healthcare Providers:  GravelBags.it  This test is no t yet approved or cleared by the [place] FDA and  has been authorized for detection and/or diagnosis of SARS-CoV-2 by FDA under an Emergency Use Authorization (EUA). This EUA will remain  in effect (meaning this test can be used) for the duration of the COVID-19 declaration under Section 564(b)(1) of the Act, 21 U.S.C. section 360bbb-3(b)(1), unless the authorization is terminated or revoked sooner.     Influenza A by PCR NEGATIVE NEGATIVE Final   Influenza B by PCR NEGATIVE NEGATIVE Final    Comment: (NOTE) The Xpert Xpress SARS-CoV-2/FLU/RSV assay is intended as an aid in  the diagnosis of influenza from Nasopharyngeal swab specimens and  should not be used as a sole basis for treatment. Nasal washings and  aspirates are unacceptable for Xpert Xpress SARS-CoV-2/FLU/RSV  testing.  Fact Sheet for Patients: PinkCheek.be  Fact Sheet for Healthcare Providers: GravelBags.it  This test is not yet approved or cleared by the [place] FDA and  has been authorized for detection and/or diagnosis of SARS-CoV-2 by  FDA under an Emergency Use Authorization (EUA). This EUA will remain  in effect (meaning this test can be used) for the duration of the  Covid-19 declaration under Section 564(b)(1) of the Act, 21  U.S.C. section 360bbb-3(b)(1), unless the authorization is  terminated or revoked. Performed at [facility], [address] [place], [place] [address]     Coagulation Studies: Recent Labs    11/03/20 1150  LABPROT 13.9  INR 1.1    Urinalysis: No results for input(s): COLORURINE, LABSPEC, PHURINE, GLUCOSEU, HGBUR, BILIRUBINUR, KETONESUR, PROTEINUR, UROBILINOGEN, NITRITE, LEUKOCYTESUR in the last 72 hours.  Invalid input(s): APPERANCEUR    Imaging: [place]  OR NERVE BLOCK-IMAGE ONLY [facility])  Result Date: 11/04/2020 There is no interpretation for this exam.  This order is for images obtained during a surgical procedure.  Please See "Surgeries" Tab for more information regarding the procedure.     Medications:    sodium chloride      amLODipine  10 mg Oral Daily   aspirin EC  81 mg Oral Daily   atorvastatin  10 mg Oral Daily   calcium carbonate  1,250 mg Oral BID WC   Chlorhexidine Gluconate Cloth  6 each Topical Q0600   Chlorhexidine Gluconate Cloth  6 each Topical Once   cloNIDine  0.2 mg Oral TID   [START ON 11/07/2020] epoetin (EPOGEN/PROCRIT) injection  10,000 Units Intravenous Q M,W,F-HD   feeding supplement (NEPRO CARB STEADY)  237 mL Oral BID BM   heparin  5,000 Units Subcutaneous Q8H   hydrALAZINE  50 mg Oral Q6H   insulin aspart  0-5 Units Subcutaneous QHS   insulin aspart  0-6 Units Subcutaneous TID WC   insulin aspart  2 Units Subcutaneous TID WC   insulin glargine  15 Units Subcutaneous Daily   irbesartan  300 mg Oral Daily   isosorbide mononitrate  120 mg Oral Daily   ketotifen  1 drop Both Eyes BID   labetalol  300 mg Oral BID   multivitamin  1 tablet Oral QHS   pantoprazole  40 mg Oral BID   sodium chloride flush  3 mL Intravenous Q12H   torsemide  100 mg Oral Once per day on Tue Thu Sat   [START ON 11/06/2020] Vitamin D (Ergocalciferol)  50,000 Units Oral Q7 days   sodium chloride, acetaminophen **OR** acetaminophen, calcium carbonate, camphor-menthol **AND** hydrOXYzine, docusate sodium, hydrALAZINE, HYDROcodone-acetaminophen, labetalol, morphine injection, nitroGLYCERIN, sodium chloride flush, sorbitol, zolpidem  Assessment/ Plan  [name] is a 44 y.o. black female with end stage renal disease on hemodialysis, hypertension, insulin dependent diabetes mellitus, who is admitted to [facility] on 10/31/2020 for Hypertensive urgency [I16.0] Elevated brain natriuretic peptide (BNP) level  [R79.89] Acute respiratory failure with hypoxia ([place]) [J96.01] Troponin I above reference range [R77.8] Hypervolemia, unspecified hypervolemia type [E87.70]  [facility] MWF [facility] RIJ permcath    1. End Stage Renal Disease Short hemodialysis today to cover for the weekend Patient will resume her normal hemodialysis regimen MWF as outpatient as she is expected to be discharged over the weekend  2. Hypertension: with hypertensive emergency on admission.    Normotensive with current regimen of antihypertensives Aggressive blood pressure control regimen of amlodipine 10 mg daily, clonidine 0.2 mg 3 times a day, hydralazine 50 mg every 6 hours, Avapro 300 mg daily, isosorbide 120 mg daily, labetalol 300 mg twice a day, torsemide 100 mg on nondialysis days.  3. Anemia with chronic kidney disease: Lab Results  Component Value Date   HGB 7.8 (L) 11/05/2020    continue Epogen with dialysis   4. Secondary Hyperparathyroidism Lab Results  Component Value Date   PTH 335 (H) 10/22/2020   PTH Comment 10/22/2020   CALCIUM 8.6 (L) 11/04/2020   PHOS 3.7 11/02/2020   Monitor closely  Murlean Iba, MD [facility]  11/6/20212:34 PM

## 2020-11-05 NOTE — Progress Notes (Signed)
Patient in today for HD treatment. V/S taken and recorded and are noted to be slightly elevated. Patient states no c/o any when asked. Right CW CVC venous line sluggish with withdrawing of heparin but after patient coughing flow much better. Telemonitor in place and foley connected to suction. Treatment started and completed without any difficulties. Total UF 3065ml and noted much improvement with blood pressures. Patient voiced no c/o any when asked.

## 2020-11-05 NOTE — Progress Notes (Signed)
SATURATION QUALIFICATIONS: (This note is used to comply with regulatory documentation for home oxygen)  Patient Saturations on Room Air at Rest = 83%  Patient Saturations on Room Air while Ambulating = 83%  Patient Saturations on 3 Liters of oxygen while Ambulating = 90%  Please briefly explain why patient needs home oxygen:

## 2020-11-05 NOTE — Progress Notes (Signed)
Oxygen desats to 83% at room air at rest. Oxygen remains at 93% at rest with 2L of oxygen.

## 2020-11-05 NOTE — Progress Notes (Addendum)
Physical Therapy Evaluation Patient Details Name: Renee [name] MRN: [id number] DOB: [birth year] Today's Date: 11/05/2020   History of Present Illness  Patient s/p  Procedure(s):Marland Kitchen ARTERIOVENOUS (AV) FISTULA CREATION  Clinical Impression  Patient agrees to PT eval. She reports that it is dark and she cant see well. Her husband is at her bedside. She has -3/5 BLE hip flex and 3/5 knee extension strength. She has good sitting balance and good standing balance with UE support with RW. She is on 2 L O2 and it is increased to 3 L for mobility; she is able to perform all mobility with O2 saturation above 90%. She is MI with bed mobility, MI assist for transfers sit to stand with RW. She ambulates with RW 50 feet with min assist. She has fatigue following gait. Patient will continue to benefit from skilled PT to improve mobility and strength.    Follow Up Recommendations Home health PT    Equipment Recommendations  Rolling walker with 5" wheels    Recommendations for Other Services       Precautions / Restrictions Restrictions Weight Bearing Restrictions: No      Mobility  Bed Mobility Overal bed mobility: Modified Independent                  Transfers Overall transfer level: Modified independent Equipment used: Rolling walker (2 wheeled)             General transfer comment: vc for safety  Ambulation/Gait Ambulation/Gait assistance: Min guard Gait Distance (Feet): 50 Feet Assistive device: Rolling walker (2 wheeled) Gait Pattern/deviations: Step-through pattern        Stairs            Wheelchair Mobility    Modified Rankin (Stroke Patients Only)       Balance                                             Pertinent Vitals/Pain Pain Assessment: No/denies pain    Home Living Family/patient expects to be discharged to:: Private residence Living Arrangements: Spouse/significant other;Children Available Help at Discharge:  Family Type of Home: House Home Access: Stairs to enter Entrance Stairs-Rails: None Technical brewer of Steps: 1 Home Layout: One level Home Equipment: Environmental consultant - 2 wheels      Prior Function Level of Independence: Independent               Hand Dominance        Extremity/Trunk Assessment        Lower Extremity Assessment Lower Extremity Assessment: RLE deficits/detail;LLE deficits/detail RLE Deficits / Details: -3/5 hip flex LLE Deficits / Details: -3/5 hip flex       Communication   Communication: No difficulties  Cognition Arousal/Alertness: Awake/alert Behavior During Therapy: Flat affect Overall Cognitive Status: Within Functional Limits for tasks assessed                                        General Comments      Exercises     Assessment/Plan    PT Assessment Patient needs continued PT services  PT Problem List Decreased strength;Decreased activity tolerance;Decreased balance       PT Treatment Interventions Gait training;Functional mobility training;Therapeutic activities;Therapeutic exercise    PT Goals (Current goals can be  found in the Care Plan section)  Acute Rehab PT Goals Patient Stated Goal: to walk better PT Goal Formulation: With patient Time For Goal Achievement: 11/19/20 Potential to Achieve Goals: Good    Frequency Min 2X/week   Barriers to discharge        Co-evaluation               AM-PAC PT "6 Clicks" Mobility  Outcome Measure Help needed turning from your back to your side while in a flat bed without using bedrails?: A Little Help needed moving from lying on your back to sitting on the side of a flat bed without using bedrails?: A Little Help needed moving to and from a bed to a chair (including a wheelchair)?: A Little Help needed standing up from a chair using your arms (e.g., wheelchair or bedside chair)?: A Little Help needed to walk in hospital room?: A Little Help needed climbing  3-5 steps with a railing? : A Little 6 Click Score: 18    End of Session Equipment Utilized During Treatment: Gait belt;Oxygen Activity Tolerance: Patient limited by lethargy Patient left: in bed;with bed alarm set Nurse Communication: Mobility status PT Visit Diagnosis: Muscle weakness (generalized) (M62.81);Difficulty in walking, not elsewhere classified (R26.2)    Time: 1250-1315 PT Time Calculation (min) (ACUTE ONLY): 25 min   Charges:   PT Evaluation $PT Eval Low Complexity: 1 Low PT Treatments $Gait Training: 8-22 mins $Therapeutic Activity: 8-22 mins          Arelia Sneddon S, PT DPT 11/05/2020, 2:00 PM

## 2020-11-05 NOTE — Progress Notes (Signed)
1 Day Post-Op   Subjective/Chief Complaint: Denies left arm pain   Objective: Vital signs in last 24 hours: Temp:  [97.5 F (36.4 C)-98.4 F (36.9 C)] 98.2 F (36.8 C) (11/06 1153) Pulse Rate:  [72-80] 76 (11/06 1153) Resp:  [16-25] 20 (11/06 1153) BP: (116-148)/(64-78) 144/73 (11/06 1153) SpO2:  [83 %-100 %] 93 % (11/06 1153) Last BM Date: 11/02/20  Intake/Output from previous day: 11/05 0701 - 11/06 0700 In: 98.8 [I.V.:48.8; IV Piggyback:50] Out: 10 [Blood:10] Intake/Output this shift: No intake/output data recorded.  General appearance: alert and no distress Extremities: LEFT arm: incision- C/D/I, hand warm, faint +thrill/+bruit  Lab Results:  Recent Labs    11/02/20 1626 11/03/20 1150  WBC 7.5 6.6  HGB 8.3* 8.5*  HCT 28.2* 28.2*  PLT 223 204   BMET Recent Labs    11/03/20 1150 11/04/20 0432  NA 138 138  K 3.1* 3.5  CL 98 98  CO2 29 29  GLUCOSE 110* 110*  BUN 25* 19  CREATININE 5.44* 4.20*  CALCIUM 8.4* 8.6*   PT/INR Recent Labs    11/03/20 1150  LABPROT 13.9  INR 1.1   ABG No results for input(s): PHART, HCO3 in the last 72 hours.  Invalid input(s): PCO2, PO2  Studies/Results: [place] OR NERVE BLOCK-IMAGE ONLY [facility])  Result Date: 11/04/2020 There is no interpretation for this exam.  This order is for images obtained during a surgical procedure.  Please See "Surgeries" Tab for more information regarding the procedure.    Anti-infectives: Anti-infectives (From admission, onward)   Start     Dose/Rate Route Frequency Ordered Stop   11/04/20 1149  ceFAZolin (ANCEF) 1-4 GM/50ML-% IVPB       Note to Pharmacy: [facility]   : cabinet override      11/04/20 1149 11/04/20 1240   11/03/20 1000  ceFAZolin (ANCEF) IVPB 2g/100 mL premix        2 g 200 mL/hr over 30 Minutes Intravenous On call to O.R. 11/03/20 0930 11/04/20 0559   11/03/20 0600  ceFAZolin (ANCEF) IVPB 1 g/50 mL premix  Status:  Discontinued        1 g 100 mL/hr over 30 Minutes  Intravenous On call to O.R. 11/03/20 0139 11/03/20 0930      Assessment/Plan: s/p Procedure(s): ARTERIOVENOUS (AV) FISTULA CREATION (Left) Stable from Vascular Surgery Standpoint, no further intervention recommended  Follow Up in the Office 1 month after discharge  LOS: 5 days    Jamesetta So A 11/05/2020

## 2020-11-05 NOTE — Anesthesia Postprocedure Evaluation (Signed)
Anesthesia Post Note  Patient: Renee [name]  Procedure(s) Performed: ARTERIOVENOUS (AV) FISTULA CREATION (Left Arm Upper)  Patient location during evaluation: PACU Anesthesia Type: General Level of consciousness: awake and alert Pain management: pain level controlled Vital Signs Assessment: post-procedure vital signs reviewed and stable Respiratory status: spontaneous breathing, nonlabored ventilation, respiratory function stable and patient connected to nasal cannula oxygen Cardiovascular status: blood pressure returned to baseline and stable Postop Assessment: no apparent nausea or vomiting Anesthetic complications: no   No complications documented.   Last Vitals:  Vitals:   11/04/20 2346 11/05/20 0529  BP: 128/71 (!) 146/76  Pulse: 72 79  Resp: 18 18  Temp: 36.6 C 36.9 C  SpO2: 97% 95%    Last Pain:  Vitals:   11/05/20 0558  TempSrc:   PainSc: 8                  Renee [name]

## 2020-11-05 NOTE — Discharge Summary (Addendum)
Physician Discharge Summary  Jupiter Boys ZOX:[id number] DOB: 1976/01/03 DOA: 10/31/2020  PCP: Minda Ditto, MD  Admit date: 10/31/2020 Discharge date: 11/05/2020  Admitted From: Home Disposition:  Home with home health  Recommendations for Outpatient Follow-up:  1. Follow up with PCP in 1-2 weeks 2. Follow-up with nephrology as directed  Home Health: Yes Equipment/Devices: Yes oxygen 2 L nasal cannula Discharge Condition: Stable CODE STATUS: Full Diet recommendation: Renal  Brief/Interim Summary:44 year old female known history of CKD stage V, hypertension, diabetes admitted for worsening shortness of breath.  Patient has a recent hemodialysis started 10/24/2020.  On presentation she was markedly hypertensive requiring nicardipine infusion.  This been weaned off.  Patient transferred to general medical floor on 11/01/2020 for further management.  Patient underwent hemodialysis on 11/02/2020.  Has some issues during dialysis with hypertension and malaise.  Blood pressure remains uncontrolled.  Scheduled for another dialysis run 11/03/2020.  Blood pressure regimen adjusted per nephrology.  11/4: Patient seen and examined.  N.p.o. for dialysis access creation today.  Blood pressure improved control over interval.  11/6: Patient blood pressure much improved on multiple different BP control agents.  Suspect that hypertension is largely volume driven and should recover over time with outpatient hemodialysis treatments.  Patient remains dependent on 2 L nasal cannula at time of discharge.  She was ambulated prior to discharge and deemed appropriate for home with home health.  Case discussed at length with nephrology.  Patient will receive hemodialysis today and discharge home afterwards.  11/7: Patient was very fatigued postdialysis.  Decision was made to monitor in-house overnight.  Blood pressure control the following morning.  Patient will discharge home.  Home health ordered.  Lengthy  discussion regarding importance of antihypertensive regimen.  Patient and husband at bedside both expressed understanding.   Discharge Diagnoses:  Principal Problem:   Hypertensive urgency Active Problems:   Diabetes mellitus, type 2 (HCC)   Chronic diastolic heart failure with preserved ejection fraction (HCC)   Anemia   ESRD (end stage renal disease) on dialysis [facility])  End-stage renal disease requiring hemodialysis Patient was recently discharged last week and was started on hemodialysis on 10/25 Nephrology following for inpatient HD needs AV fistula created 11/04/2020 Continue dialysis via right chest PermCath until fistula maturation  Hypertensive urgency Refractory hypertension Was started on nicardipine drip for blood pressure of 222/124 for which she required stepdown Cardene drip was weaned off and patient was transitioned to oral regimen Patient requires several different antihypertensives from a multiple drug classes to achieve good blood pressure control Prescribed on discharge  Anemia of chronic kidney disease Hemoglobin appears stable in the mid eights.  No obvious bleeding noted.  Type 2 diabetes with CKD 5 Patient can resume home regimen at time of discharge  Morbid obesity Body mass index is 44.76 kg/m. This complicates overall prognosis and care  Discharge Instructions   Allergies as of 11/05/2020   No Known Allergies     Medication List    TAKE these medications   amLODipine 10 MG tablet Commonly known as: NORVASC Take 10 mg by mouth daily.   calcitRIOL 0.25 MCG capsule Commonly known as: ROCALTROL Take 0.25 mcg by mouth every Monday, Wednesday, and Friday.   calcium carbonate 1250 (500 Ca) MG tablet Commonly known as: OS-CAL - dosed in mg of elemental calcium Take 1 tablet (1,250 mg total) by mouth 2 (two) times daily with a meal.   cloNIDine 0.2 MG tablet Commonly known as: CATAPRES Take 0.2 mg by mouth  3 (three) times daily.    hydrALAZINE 50 MG tablet Commonly known as: APRESOLINE Take 1 tablet (50 mg total) by mouth 4 (four) times daily. What changed: when to take this   insulin aspart 100 UNIT/ML injection Commonly known as: novoLOG Inject 9 Units into the skin 3 (three) times daily before meals.   insulin glargine 100 UNIT/ML injection Commonly known as: LANTUS Inject 0.15 mLs (15 Units total) into the skin daily. What changed:   how much to take  when to take this   irbesartan 300 MG tablet Commonly known as: AVAPRO Take 1 tablet (300 mg total) by mouth daily.   isosorbide mononitrate 120 MG 24 hr tablet Commonly known as: IMDUR Take 1 tablet (120 mg total) by mouth daily. What changed:   medication strength  how much to take   labetalol 300 MG tablet Commonly known as: NORMODYNE Take 1 tablet (300 mg total) by mouth 2 (two) times daily.   multivitamin Tabs tablet Take 1 tablet by mouth at bedtime.   pantoprazole 40 MG tablet Commonly known as: PROTONIX Take 1 tablet (40 mg total) by mouth 2 (two) times daily.   torsemide 100 MG tablet Commonly known as: DEMADEX Take 1 tablet (100 mg total) by mouth 3 (three) times a week. Start taking on: November 08, 2020   Vitamin D (Ergocalciferol) 1.25 MG (50000 UNIT) Caps capsule Commonly known as: DRISDOL Take 1 capsule (50,000 Units total) by mouth every 7 (seven) days.            Durable Medical Equipment  (From admission, onward)         Start     Ordered   11/05/20 1417  For home use only DME Walker rolling  Once       Question Answer Comment  Walker: With [place] Wheels   Patient needs a walker to treat with the following condition Weakness      11/05/20 1416   11/05/20 1026  For home use only DME oxygen  Once       Question Answer Comment  Length of Need 6 Months   Mode or (Route) Nasal cannula   Liters per Minute 2   Frequency Continuous (stationary and portable oxygen unit needed)   Oxygen conserving device Yes    Oxygen delivery system Gas      11/05/20 1025          Follow-up Information    [facility] up on 11/08/2020.   Specialty: Cardiology Why: at 12:00pm. Enter through the [place] entrance Contact information: [place] [phone]       Minda Ditto, MD. Schedule an appointment as soon as possible for a visit in 1 week(s).   Specialty: Family Medicine Contact information: [place] [address] [phone]        Minna Merritts, MD .   Specialty: Cardiology Contact information: [place] [address] [phone]              No Known Allergies  Consultations:  Nephrology  Vascular surgery   Procedures/Studies: DG Chest 1 View  Result Date: 10/31/2020 CLINICAL DATA:  Shortness of breath. EXAM: CHEST  1 VIEW COMPARISON:  October 20, 2020. FINDINGS: Stable cardiomegaly. No pneumothorax is noted. Right internal jugular dialysis catheter is noted. Mild bibasilar atelectasis is noted with small pleural effusions. Bony  thorax is unremarkable. IMPRESSION: Mild bibasilar subsegmental atelectasis with small pleural effusions. Electronically Signed   By: Marijo Conception M.D.   On: 10/31/2020 13:34   DG Chest 2 View  Result Date: 10/20/2020 CLINICAL DATA:  Chest pain and lower extremity swelling. EXAM: CHEST - 2 VIEW COMPARISON:  January 20, 2020 FINDINGS: Very mildly increased interstitial lung markings are noted, bilaterally. Mild atelectasis and/or infiltrate is seen within the right lung base. There is a small right pleural effusion. No pneumothorax is identified. The cardiac silhouette is markedly enlarged. The visualized skeletal structures are unremarkable. IMPRESSION: 1. Cardiomegaly with very mild interstitial edema. 2. Mild right basilar atelectasis and/or infiltrate. 3. Small right  pleural effusion. Electronically Signed   By: Virgina Norfolk M.D.   On: 10/20/2020 21:49   US RENAL  Result Date: 10/21/2020 CLINICAL DATA:  Acute renal failure EXAM: RENAL / URINARY TRACT ULTRASOUND COMPLETE COMPARISON:  Ultrasound renal 08/26/2019 FINDINGS: Right Kidney: Renal measurements: 9.1 x 4.2 x 4.3 cm = volume: 85 mL. Increased echogenicity of the renal cortex diffusely. No mass or obstruction Left Kidney: Renal measurements: 8.1 x 3.8 x 3.9 cm = volume: 62 mL. Diffusely increased echogenicity of the renal cortex. No mass or obstruction. Bladder: Not visualized.  Patient has a catheter. Other: Ascites around the liver. IMPRESSION: Small kidneys with diffusely increased echogenicity of the renal cortex. No obstruction. Electronically Signed   By: Franchot Gallo M.D.   On: 10/21/2020 16:21   PERIPHERAL VASCULAR CATHETERIZATION  Result Date: 10/24/2020 See op note  [place] UE VEIN MAPPING LEFT (PRE-OP AVF)  Result Date: 11/03/2020 CLINICAL DATA:  End-stage renal disease and planned dialysis fistula versus graft placement in the left upper extremity. EXAM: [place] EXTREM UP VEIN MAPPING COMPARISON:  None. FINDINGS: LEFT ARTERIES Wrist Radial Artery: Size 1.107mm Waveform triphasic Wrist Ulnar Artery: Size 1.46mm Waveform triphasic Prox. Forearm Radial Artery: Size 1.14mm Waveform triphasic Upper Arm Brachial Artery: Size 3.36mm Waveform triphasic LEFT VEINS Forearm Cephalic Vein: Prox 2.4MQ Distal 1.74mm Depth 2.8MN Upper Arm Cephalic Vein: Prox 8.1RR Distal 5.24mm Depth 1.1AF Upper Arm Basilic Vein: Prox 7.9UX Distal 5.63mm Depth 1.55mm Upper Arm Brachial Vein: Prox 7.47mm Distal 3.39mm Depth 2.37mm ADDITIONAL LEFT VEINS Axillary Vein:  8.51mm Subclavian Vein: Patient: Yes respiratory Phasicity: Present Internal Jugular Vein: Patent: Yes respiratory Phasicity: Present Branches > 2 mm: There are 2 separate proximal upper arm cephalic vein branches measuring 2.6 and 2.9 mm. Distal upper arm basilic vein branch  measures 3 mm. IMPRESSION: Left upper extremity vein mapping characterization by ultrasound as above with arterial evaluation. Electronically Signed   By: Aletta Edouard M.D.   On: 11/03/2020 10:51   [place] OR NERVE BLOCK-IMAGE ONLY [facility])  Result Date: 11/04/2020 There is no interpretation for this exam.  This order is for images obtained during a surgical procedure.  Please See "Surgeries" Tab for more information regarding the procedure.   ECHOCARDIOGRAM COMPLETE  Result Date: 11/01/2020    ECHOCARDIOGRAM REPORT   Patient Name:   ASHELYN MCCRAVY Date of Exam: 11/01/2020 Medical Rec #:  [id number]       Height:       65.0 in Accession #:    [id number]      Weight:       269.0 lb Date of Birth:  06/26/1976        BSA:          2.244 m Patient Age:    89 years  BP:           168/82 mmHg Patient Gender: F               HR:           96 bpm. Exam Location:  [facility] Procedure: 2D Echo, Color Doppler and Cardiac Doppler Indications:     I50.31 CHF-Acute Diastolic  History:         Patient has prior history of Echocardiogram examinations.                  HFpEF, CKD; Risk Factors:Hypertension and Diabetes.  Sonographer:     Charmayne Sheer RDCS (AE) Referring Phys:  [id number] Para Skeans Diagnosing Phys: Serafina Royals MD  Sonographer Comments: Suboptimal subcostal window. Image acquisition challenging due to respiratory motion. IMPRESSIONS  1. Left ventricular ejection fraction, by estimation, is 60 to 65%. The left ventricle has normal function. The left ventricle has no regional wall motion abnormalities. Left ventricular diastolic parameters were normal.  2. Right ventricular systolic function is normal. The right ventricular size is normal.  3. The mitral valve is normal in structure. Trivial mitral valve regurgitation.  4. The aortic valve is normal in structure. Aortic valve regurgitation is not visualized. FINDINGS  Left Ventricle: Left ventricular ejection fraction, by estimation, is 60 to 65%. The left ventricle  has normal function. The left ventricle has no regional wall motion abnormalities. The left ventricular internal cavity size was normal in size. There is  no left ventricular hypertrophy. Left ventricular diastolic parameters were normal. Right Ventricle: The right ventricular size is normal. No increase in right ventricular wall thickness. Right ventricular systolic function is normal. Left Atrium: Left atrial size was normal in size. Right Atrium: Right atrial size was normal in size. Pericardium: There is no evidence of pericardial effusion. Mitral Valve: The mitral valve is normal in structure. Trivial mitral valve regurgitation. MV peak gradient, 6.2 mmHg. The mean mitral valve gradient is 4.0 mmHg. Tricuspid Valve: The tricuspid valve is normal in structure. Tricuspid valve regurgitation is trivial. Aortic Valve: The aortic valve is normal in structure. Aortic valve regurgitation is not visualized. Aortic valve mean gradient measures 4.0 mmHg. Aortic valve peak gradient measures 10.9 mmHg. Aortic valve area, by VTI measures 3.27 cm. Pulmonic Valve: The pulmonic valve was normal in structure. Pulmonic valve regurgitation is not visualized. Aorta: The aortic root and ascending aorta are structurally normal, with no evidence of dilitation. IAS/Shunts: No atrial level shunt detected by color flow Doppler.  LEFT VENTRICLE PLAX 2D LVIDd:         4.66 cm  Diastology LVIDs:         3.35 cm  LV e' medial:    5.33 cm/s LV PW:         1.56 cm  LV E/e' medial:  17.7 LV IVS:        1.26 cm  LV e' lateral:   5.55 cm/s LVOT diam:     2.20 cm  LV E/e' lateral: 17.0 LV SV:         77 LV SV Index:   34 LVOT Area:     3.80 cm  RIGHT VENTRICLE RV Basal diam:  3.70 cm LEFT ATRIUM              Index       RIGHT ATRIUM           Index LA diam:        4.90 cm  2.18 cm/m  RA Area:     22.10 cm LA Vol (A2C):   82.6 ml  36.82 ml/m RA Volume:   64.20 ml  28.62 ml/m LA Vol (A4C):   139.0 ml 61.96 ml/m LA Biplane Vol: 112.0 ml 49.92  ml/m  AORTIC VALVE                   PULMONIC VALVE AV Area (Vmax):    2.90 cm    PV Vmax:       1.06 m/s AV Area (Vmean):   3.05 cm    PV Vmean:      69.200 cm/s AV Area (VTI):     3.27 cm    PV VTI:        0.194 m AV Vmax:           165.00 cm/s PV Peak grad:  4.5 mmHg AV Vmean:          92.500 cm/s PV Mean grad:  2.0 mmHg AV VTI:            0.236 m AV Peak Grad:      10.9 mmHg AV Mean Grad:      4.0 mmHg LVOT Vmax:         126.00 cm/s LVOT Vmean:        74.300 cm/s LVOT VTI:          0.203 m LVOT/AV VTI ratio: 0.86  AORTA Ao Root diam: 3.30 cm MITRAL VALVE                TRICUSPID VALVE MV Area (PHT): 7.16 cm     TR Peak grad:   43.0 mmHg MV Peak grad:  6.2 mmHg     TR Vmax:        328.00 cm/s MV Mean grad:  4.0 mmHg MV Vmax:       1.25 m/s     SHUNTS MV Vmean:      97.7 cm/s    Systemic VTI:  0.20 m MV Decel Time: 106 msec     Systemic Diam: 2.20 cm MV E velocity: 94.10 cm/s MV A velocity: 107.00 cm/s MV E/A ratio:  0.88 Serafina Royals MD Electronically signed by Serafina Royals MD Signature Date/Time: 11/01/2020/1:37:50 PM    Final     (Echo, Carotid, EGD, Colonoscopy, ERCP)    Subjective: Patient seen and examined the day of discharge.  Husband at bedside.  Patient is lethargic but answers all questions appropriately.  Hemodynamically stable.  Stable for discharge home.  Discharge Exam: Vitals:   11/05/20 0900 11/05/20 1153  BP: 138/76 (!) 144/73  Pulse: 77 76  Resp: 18 20  Temp: 97.9 F (36.6 C) 98.2 F (36.8 C)  SpO2: 95% 93%   Vitals:   11/05/20 0529 11/05/20 0858 11/05/20 0900 11/05/20 1153  BP: (!) 146/76  138/76 (!) 144/73  Pulse: 79  77 76  Resp: 18  18 20   Temp: 98.4 F (36.9 C)  97.9 F (36.6 C) 98.2 F (36.8 C)  TempSrc:   Oral Oral  SpO2: 95% (!) 83% 95% 93%  Weight:      Height:        General: Pt is alert, awake, not in acute distress Cardiovascular: RRR, S1/S2 +, no rubs, no gallops Respiratory: CTA bilaterally, no wheezing, no rhonchi Abdominal: Soft, NT,  ND, bowel sounds + Extremities: no edema, no cyanosis    The results of significant diagnostics from this hospitalization (including imaging, microbiology, ancillary  and laboratory) are listed below for reference.     Microbiology: Recent Results (from the past 240 hour(s))  Respiratory Panel by RT PCR (Flu A&B, Covid) - Nasopharyngeal Swab     Status: None   Collection Time: 10/31/20  1:02 PM   Specimen: Nasopharyngeal Swab  Result Value Ref Range Status   SARS Coronavirus 2 by RT PCR NEGATIVE NEGATIVE Final    Comment: (NOTE) SARS-CoV-2 target nucleic acids are NOT DETECTED.  The SARS-CoV-2 RNA is generally detectable in upper respiratoy specimens during the acute phase of infection. The lowest concentration of SARS-CoV-2 viral copies this assay can detect is 131 copies/mL. A negative result does not preclude SARS-Cov-2 infection and should not be used as the sole basis for treatment or other patient management decisions. A negative result may occur with  improper specimen collection/handling, submission of specimen other than nasopharyngeal swab, presence of viral mutation(s) within the areas targeted by this assay, and inadequate number of viral copies (<131 copies/mL). A negative result must be combined with clinical observations, patient history, and epidemiological information. The expected result is Negative.  Fact Sheet for Patients:  PinkCheek.be  Fact Sheet for Healthcare Providers:  GravelBags.it  This test is no t yet approved or cleared by the [place] FDA and  has been authorized for detection and/or diagnosis of SARS-CoV-2 by FDA under an Emergency Use Authorization (EUA). This EUA will remain  in effect (meaning this test can be used) for the duration of the COVID-19 declaration under Section 564(b)(1) of the Act, 21 U.S.C. section 360bbb-3(b)(1), unless the authorization is terminated or revoked  sooner.     Influenza A by PCR NEGATIVE NEGATIVE Final   Influenza B by PCR NEGATIVE NEGATIVE Final    Comment: (NOTE) The Xpert Xpress SARS-CoV-2/FLU/RSV assay is intended as an aid in  the diagnosis of influenza from Nasopharyngeal swab specimens and  should not be used as a sole basis for treatment. Nasal washings and  aspirates are unacceptable for Xpert Xpress SARS-CoV-2/FLU/RSV  testing.  Fact Sheet for Patients: PinkCheek.be  Fact Sheet for Healthcare Providers: GravelBags.it  This test is not yet approved or cleared by the [place] FDA and  has been authorized for detection and/or diagnosis of SARS-CoV-2 by  FDA under an Emergency Use Authorization (EUA). This EUA will remain  in effect (meaning this test can be used) for the duration of the  Covid-19 declaration under Section 564(b)(1) of the Act, 21  U.S.C. section 360bbb-3(b)(1), unless the authorization is  terminated or revoked. Performed at [facility], [address] [place], [place] [address]      Labs: BNP (last 3 results) Recent Labs    01/20/20 0255 10/31/20 1229  BNP 212.4* 4,332.9*   Basic Metabolic Panel: Recent Labs  Lab 10/31/20 1229 11/02/20 0734 11/02/20 1626 11/03/20 1150 11/04/20 0432  NA 143 139 139 138 138  K 3.5 3.5 3.2* 3.1* 3.5  CL 105 101 99 98 98  CO2 24 27 29 29 29   GLUCOSE 126* 95 114* 110* 110*  BUN 49* 34* 29* 25* 19  CREATININE 7.68* 6.94* 5.54* 5.44* 4.20*  CALCIUM 9.0 8.4* 8.5* 8.4* 8.6*  MG  --   --   --  1.9  --   PHOS  --   --  3.7  --   --    Liver Function Tests: Recent Labs  Lab 10/31/20 1229 11/02/20 1626  AST 26  --   ALT 29  --  ALKPHOS 142*  --   BILITOT 0.9  --   PROT 7.8  --   ALBUMIN 3.8 3.4*   No results for input(s): LIPASE, AMYLASE in the last 168 hours. No results for input(s): AMMONIA in the last 168 hours. CBC: Recent Labs  Lab 10/31/20 1229 10/31/20 2326  11/02/20 0734 11/02/20 1626 11/03/20 1150  WBC 11.3* 9.1 6.1 7.5 6.6  NEUTROABS 9.8*  --  4.6  --   --   HGB 9.2* 8.8* 7.9* 8.3* 8.5*  HCT 31.2* 30.1* 26.8* 28.2* 28.2*  MCV 80.6 80.5 81.5 80.8 80.1  PLT 266 228 188 223 204   Cardiac Enzymes: No results for input(s): CKTOTAL, CKMB, CKMBINDEX, TROPONINI in the last 168 hours. BNP: Invalid input(s): POCBNP CBG: Recent Labs  Lab 11/04/20 1401 11/04/20 1614 11/04/20 2111 11/05/20 0758 11/05/20 1146  GLUCAP 116* 112* 109* 114* 142*   D-Dimer No results for input(s): DDIMER in the last 72 hours. Hgb A1c No results for input(s): HGBA1C in the last 72 hours. Lipid Profile No results for input(s): CHOL, HDL, LDLCALC, TRIG, CHOLHDL, LDLDIRECT in the last 72 hours. Thyroid function studies No results for input(s): TSH, T4TOTAL, T3FREE, THYROIDAB in the last 72 hours.  Invalid input(s): FREET3 Anemia work up No results for input(s): VITAMINB12, FOLATE, FERRITIN, TIBC, IRON, RETICCTPCT in the last 72 hours. Urinalysis    Component Value Date/Time   COLORURINE YELLOW (A) 10/21/2020 0458   APPEARANCEUR CLOUDY (A) 10/21/2020 0458   LABSPEC 1.011 10/21/2020 0458   PHURINE 5.0 10/21/2020 0458   GLUCOSEU NEGATIVE 10/21/2020 0458   HGBUR SMALL (A) 10/21/2020 0458   BILIRUBINUR NEGATIVE 10/21/2020 0458   KETONESUR NEGATIVE 10/21/2020 0458   PROTEINUR 100 (A) 10/21/2020 0458   NITRITE POSITIVE (A) 10/21/2020 0458   LEUKOCYTESUR SMALL (A) 10/21/2020 0458   Sepsis Labs Invalid input(s): PROCALCITONIN,  WBC,  LACTICIDVEN Microbiology Recent Results (from the past 240 hour(s))  Respiratory Panel by RT PCR (Flu A&B, Covid) - Nasopharyngeal Swab     Status: None   Collection Time: 10/31/20  1:02 PM   Specimen: Nasopharyngeal Swab  Result Value Ref Range Status   SARS Coronavirus 2 by RT PCR NEGATIVE NEGATIVE Final    Comment: (NOTE) SARS-CoV-2 target nucleic acids are NOT DETECTED.  The SARS-CoV-2 RNA is generally detectable in  upper respiratoy specimens during the acute phase of infection. The lowest concentration of SARS-CoV-2 viral copies this assay can detect is 131 copies/mL. A negative result does not preclude SARS-Cov-2 infection and should not be used as the sole basis for treatment or other patient management decisions. A negative result may occur with  improper specimen collection/handling, submission of specimen other than nasopharyngeal swab, presence of viral mutation(s) within the areas targeted by this assay, and inadequate number of viral copies (<131 copies/mL). A negative result must be combined with clinical observations, patient history, and epidemiological information. The expected result is Negative.  Fact Sheet for Patients:  PinkCheek.be  Fact Sheet for Healthcare Providers:  GravelBags.it  This test is no t yet approved or cleared by the [place] FDA and  has been authorized for detection and/or diagnosis of SARS-CoV-2 by FDA under an Emergency Use Authorization (EUA). This EUA will remain  in effect (meaning this test can be used) for the duration of the COVID-19 declaration under Section 564(b)(1) of the Act, 21 U.S.C. section 360bbb-3(b)(1), unless the authorization is terminated or revoked sooner.     Influenza A by PCR NEGATIVE NEGATIVE Final  Influenza B by PCR NEGATIVE NEGATIVE Final    Comment: (NOTE) The Xpert Xpress SARS-CoV-2/FLU/RSV assay is intended as an aid in  the diagnosis of influenza from Nasopharyngeal swab specimens and  should not be used as a sole basis for treatment. Nasal washings and  aspirates are unacceptable for Xpert Xpress SARS-CoV-2/FLU/RSV  testing.  Fact Sheet for Patients: PinkCheek.be  Fact Sheet for Healthcare Providers: GravelBags.it  This test is not yet approved or cleared by the [place] FDA and  has been  authorized for detection and/or diagnosis of SARS-CoV-2 by  FDA under an Emergency Use Authorization (EUA). This EUA will remain  in effect (meaning this test can be used) for the duration of the  Covid-19 declaration under Section 564(b)(1) of the Act, 21  U.S.C. section 360bbb-3(b)(1), unless the authorization is  terminated or revoked. Performed at [facility], [address] [place], [place] [address]      Time coordinating discharge: Over 30 minutes  SIGNED:   Sidney Ace, MD  [facility] 11/05/2020, 2:17 PM Pager   If 7PM-7AM, please contact night-coverage

## 2020-11-05 NOTE — TOC Progression Note (Signed)
Transition of Care [facility]) - Progression Note    Patient Details  Name: Latanga Nedrow MRN: [id number] Date of Birth: 1976-02-24  Transition of Care [facility]) CM/SW [place], [place] Phone Number: 11/05/2020, 12:42 PM  Clinical Narrative:     DME order was put in. CSW contacted [facility] and 2L of oxygen was set up for patient.  CSW got GoodRX coupons for new medications (Irbesartan 300mg  and Torsemide 100mg ) for [facility]. [facility] both [facility] for [facility]9 ([facility] was [facility] of [facility])    Expected Discharge Plan: Home/Self Care Barriers to Discharge: Continued Medical Work up  Expected Discharge Plan and Services Expected Discharge Plan: Home/Self Care     Post Acute Care Choice: NA Living arrangements for the past 2 months: Single Family Home                                       Social Determinants of Health (SDOH) Interventions    Readmission Risk Interventions Readmission Risk Prevention Plan 11/02/2020 10/27/2020  Transportation Screening Complete Complete  PCP or Specialist Appt within 3-5 Days Complete -  Social Work Consult for [employer] Planning/Counseling Complete -  [place] Screening Not Applicable -  Medication Review Press photographer) Complete Complete  PCP or Specialist appointment within 3-5 days of discharge - Complete  HRI or [place] - Complete  SW Recovery Care/Counseling Consult - Complete  [place] - Not Applicable  Some recent data might be hidden

## 2020-11-06 ENCOUNTER — Encounter: Payer: Self-pay | Admitting: Vascular Surgery

## 2020-11-06 DIAGNOSIS — I160 Hypertensive urgency: Secondary | ICD-10-CM | POA: Diagnosis not present

## 2020-11-06 LAB — GLUCOSE, CAPILLARY
Glucose-Capillary: 126 mg/dL — ABNORMAL HIGH (ref 70–99)
Glucose-Capillary: 94 mg/dL (ref 70–99)

## 2020-11-06 NOTE — Progress Notes (Signed)
Mobility Specialist - Progress Note   11/06/20 1029  Mobility  Activity Stood at bedside;Sat and stood x 3 (standing marches 20 reps x 3 sets)  Level of Assistance Standby assist, set-up cues, supervision of patient - no hands on  Assistive Device Front wheel walker  Mobility Response Tolerated well  Mobility performed by Mobility specialist  $Mobility charge 1 Mobility    Pt laying in bed w/ visitor present in room. Pt agreed to session. Pt able to get to EOB mod. Independently. Pt S2S SBA. Pt stood at bedside for a couple of minutes. Pt S2S x 3, and performed standing marches x 20 after every S2S attempt. No c/o SOB or pain. O2 sat > 93% t/o session. Pt on 2L O2 [place]. Overall, pt tolerated session well. Pt left laying in bed w/ all needs placed in reach. Dr entered room at the end of session.     Jakhari Space Mobility Specialist  11/06/20, 10:34 AM

## 2020-11-06 NOTE — TOC Progression Note (Addendum)
Transition of Care [facility]) - Progression Note    Patient Details  Name: Renee [name] MRN: [id number] Date of Birth: [birth year]  Transition of Care [facility]) CM/SW Contact  Izola Price, RN Phone Number: 11/06/2020, 9:54 AM  Clinical Narrative:    88 Spoke with RN taking care of patient and verbally updated her on status and that CM was trying to reach PT. Asked her to please update her and/or have her call CM if any questions or wants CM to visit. Will look for RW to be delivered before discharging patient. O2 for transport is with patient already. Simmie Davies RN CM  Update: 11/06/20 1015 Attempted to call patient's room number, mobile number with no answer. Notified daughter contact to update on [facility] services and start date. Secure chat RN for update and to wait for RW before discharge. Will try patient again.Simmie Davies RN CM  11/06/20 0930  Updated notes indicated new HH PTwith RW ordered for discharge.   Updated order with [facility].   RW to be delivered today prior to discharge.   Oxygen Home services set up yesterday via [facility] according to SW notes.  [facility] RN Services added per provider. Unit RN updated on situation per secure chat.   Romualdo Bolk accepted for [facility] and can start on Monday 11/07/20. Simmie Davies RN CM   Expected Discharge Plan: Home/Self Care Barriers to Discharge: Continued Medical Work up  Expected Discharge Plan and Services Expected Discharge Plan: Home/Self Care     Post Acute Care Choice: NA Living arrangements for the past 2 months: Single Family Home Expected Discharge Date: 11/06/20                                     Social Determinants of Health (SDOH) Interventions    Readmission Risk Interventions Readmission Risk Prevention Plan 11/02/2020 10/27/2020  Transportation Screening Complete Complete  PCP or Specialist Appt within 3-5 Days Complete -  Social Work Consult for [place] Planning/Counseling Complete -   [place] Screening Not Applicable -  Medication Review Press photographer) Complete Complete  PCP or Specialist appointment within 3-5 days of discharge - Complete  HRI or [place] - Complete  SW Recovery Care/Counseling Consult - Complete  [place] - Not Applicable  Some recent data might be hidden

## 2020-11-06 NOTE — Progress Notes (Addendum)
PROGRESS NOTE    Renee [name]  GYB:[id number] DOB: 06-18-1976 DOA: 10/31/2020 PCP: Minda Ditto, MD    Brief Narrative:  44 year old female known history of CKD stage V, hypertension, diabetes admitted for worsening shortness of breath.  Patient has a recent hemodialysis started 10/24/2020.  On presentation she was markedly hypertensive requiring nicardipine infusion.  This been weaned off.  Patient transferred to general medical floor on 11/01/2020 for further management.  Patient underwent hemodialysis on 11/02/2020.  Has some issues during dialysis with hypertension and malaise.  Blood pressure remains uncontrolled.  Scheduled for another dialysis run 11/03/2020.  Blood pressure regimen adjusted per nephrology.  11/4: Patient seen and examined.  N.p.o. for dialysis access creation today.  Blood pressure improved control over interval.  11/5: Blood pressure control improved.  Patient lethargic but otherwise without complaint.   Assessment & Plan:   Principal Problem:   Hypertensive urgency Active Problems:   Diabetes mellitus, type 2 (HCC)   Chronic diastolic heart failure with preserved ejection fraction (HCC)   Anemia   ESRD (end stage renal disease) on dialysis [facility])  End-stage renal disease requiring hemodialysis Patient was recently discharged last week and was started on hemodialysis on 10/25 Nephrology following for inpatient HD needs AV fistula in place 11/04/2020 Nephrology and vascular on board  Hypertensive urgency Was started on nicardipine drip for blood pressure of 222/124 for which she required stepdown The Cardene drip is been weaned down the patient's blood pressure control remain suboptimal Plan: Oral blood pressure regimen adjusted per nephrology We will continue to monitor blood pressure and adjust medications as needed Dialysis per nephrology  Anemia of chronic kidney disease Hemoglobin appears stable in the mid eights.  No obvious bleeding  noted.  Type 2 diabetes with CKD 5 Lantus 15 units subcu daily Consult diabetic nurse, last hemoglobin A1c was 5.9   Morbid obesity Body mass index is 44.76 kg/m. This complicates overall prognosis and care   DVT prophylaxis: Subcutaneous heparin Code Status: Full Family Communication: Spouse at bedside 11/04/2020 Disposition Plan: Status is: Inpatient  Remains inpatient appropriate because:IV treatments appropriate due to intensity of illness or inability to take PO   Dispo:  Patient From: Home  Planned Disposition: Home  Expected discharge date: 11/06/20  Medically stable for discharge: No  Dialysis today.  Patient fatigue post dialysis.  We will plan on discharge tomorrow if energy level improved and blood pressure controlled.       Consultants:   Nephrology  Procedures:   None  Antimicrobials:   None   Subjective: Patient seen and examined.  Lethargic.  No pain complaints.  Objective: Vitals:   11/05/20 2149 11/05/20 2328 11/06/20 0436 11/06/20 0742  BP: 138/70 (!) 146/72 136/66 124/67  Pulse:  87 81 79  Resp:  18 20   Temp:  98.4 F (36.9 C) 98.2 F (36.8 C) 98.9 F (37.2 C)  TempSrc:  Oral Oral Oral  SpO2:  96% 96% 100%  Weight:      Height:        Intake/Output Summary (Last 24 hours) at 11/06/2020 1108 Last data filed at 11/06/2020 0900 Gross per 24 hour  Intake 120 ml  Output 650 ml  Net -530 ml   Filed Weights   11/02/20 0153 11/03/20 0431 11/04/20 1138  Weight: 112 kg 106.5 kg 106.5 kg    Examination:  General exam: Tearful Respiratory system: Clear to auscultation.  Room air.  Normal work of breathing Cardiovascular system: S1 & S2 heard,  RRR. No JVD, murmurs, rubs, gallops or clicks. No pedal edema. Gastrointestinal system: Abdomen is nondistended, soft and nontender. No organomegaly or masses felt. Normal bowel sounds heard. Central nervous system: Alert and oriented. No focal neurological deficits. Extremities:  Symmetric 5 x 5 power. Skin: No obvious rashes or lesions.  Right IJ PermCath Psychiatry: Judgement and insight appear normal. Mood & affect appropriate.     Data Reviewed: I have personally reviewed following labs and imaging studies  CBC: Recent Labs  Lab 10/31/20 1229 10/31/20 1229 10/31/20 2326 11/02/20 0734 11/02/20 1626 11/03/20 1150 11/05/20 1412  WBC 11.3*   < > 9.1 6.1 7.5 6.6 7.8  NEUTROABS 9.8*  --   --  4.6  --   --   --   HGB 9.2*   < > 8.8* 7.9* 8.3* 8.5* 7.8*  HCT 31.2*   < > 30.1* 26.8* 28.2* 28.2* 27.3*  MCV 80.6   < > 80.5 81.5 80.8 80.1 83.7  PLT 266   < > 228 188 223 204 203   < > = values in this interval not displayed.   Basic Metabolic Panel: Recent Labs  Lab 11/02/20 0734 11/02/20 1626 11/03/20 1150 11/04/20 0432 11/05/20 1412  NA 139 139 138 138 137  K 3.5 3.2* 3.1* 3.5 4.3  CL 101 99 98 98 97*  CO2 27 29 29 29 28   GLUCOSE 95 114* 110* 110* 134*  BUN 34* 29* 25* 19 36*  CREATININE 6.94* 5.54* 5.44* 4.20* 6.69*  CALCIUM 8.4* 8.5* 8.4* 8.6* 8.1*  MG  --   --  1.9  --   --   PHOS  --  3.7  --   --  6.6*   GFR: Estimated Creatinine Clearance: 12.9 mL/min (A) (by C-G formula based on SCr of 6.69 mg/dL (H)). Liver Function Tests: Recent Labs  Lab 10/31/20 1229 11/02/20 1626 11/05/20 1412  AST 26  --   --   ALT 29  --   --   ALKPHOS 142*  --   --   BILITOT 0.9  --   --   PROT 7.8  --   --   ALBUMIN 3.8 3.4* 3.1*   No results for input(s): LIPASE, AMYLASE in the last 168 hours. No results for input(s): AMMONIA in the last 168 hours. Coagulation Profile: Recent Labs  Lab 11/03/20 1150  INR 1.1   Cardiac Enzymes: No results for input(s): CKTOTAL, CKMB, CKMBINDEX, TROPONINI in the last 168 hours. BNP (last 3 results) No results for input(s): PROBNP in the last 8760 hours. HbA1C: No results for input(s): HGBA1C in the last 72 hours. CBG: Recent Labs  Lab 11/04/20 2111 11/05/20 0758 11/05/20 1146 11/05/20 1914 11/06/20 0744   GLUCAP 109* 114* 142* 98 126*   Lipid Profile: No results for input(s): CHOL, HDL, LDLCALC, TRIG, CHOLHDL, LDLDIRECT in the last 72 hours. Thyroid Function Tests: No results for input(s): TSH, T4TOTAL, FREET4, T3FREE, THYROIDAB in the last 72 hours. Anemia Panel: No results for input(s): VITAMINB12, FOLATE, FERRITIN, TIBC, IRON, RETICCTPCT in the last 72 hours. Sepsis Labs: No results for input(s): PROCALCITON, LATICACIDVEN in the last 168 hours.  Recent Results (from the past 240 hour(s))  Respiratory Panel by RT PCR (Flu A&B, Covid) - Nasopharyngeal Swab     Status: None   Collection Time: 10/31/20  1:02 PM   Specimen: Nasopharyngeal Swab  Result Value Ref Range Status   SARS Coronavirus 2 by RT PCR NEGATIVE NEGATIVE Final    Comment: (  NOTE) SARS-CoV-2 target nucleic acids are NOT DETECTED.  The SARS-CoV-2 RNA is generally detectable in upper respiratoy specimens during the acute phase of infection. The lowest concentration of SARS-CoV-2 viral copies this assay can detect is 131 copies/mL. A negative result does not preclude SARS-Cov-2 infection and should not be used as the sole basis for treatment or other patient management decisions. A negative result may occur with  improper specimen collection/handling, submission of specimen other than nasopharyngeal swab, presence of viral mutation(s) within the areas targeted by this assay, and inadequate number of viral copies (<131 copies/mL). A negative result must be combined with clinical observations, patient history, and epidemiological information. The expected result is Negative.  Fact Sheet for Patients:  PinkCheek.be  Fact Sheet for Healthcare Providers:  GravelBags.it  This test is no t yet approved or cleared by the [place] FDA and  has been authorized for detection and/or diagnosis of SARS-CoV-2 by FDA under an Emergency Use Authorization (EUA). This EUA  will remain  in effect (meaning this test can be used) for the duration of the COVID-19 declaration under Section 564(b)(1) of the Act, 21 U.S.C. section 360bbb-3(b)(1), unless the authorization is terminated or revoked sooner.     Influenza A by PCR NEGATIVE NEGATIVE Final   Influenza B by PCR NEGATIVE NEGATIVE Final    Comment: (NOTE) The Xpert Xpress SARS-CoV-2/FLU/RSV assay is intended as an aid in  the diagnosis of influenza from Nasopharyngeal swab specimens and  should not be used as a sole basis for treatment. Nasal washings and  aspirates are unacceptable for Xpert Xpress SARS-CoV-2/FLU/RSV  testing.  Fact Sheet for Patients: PinkCheek.be  Fact Sheet for Healthcare Providers: GravelBags.it  This test is not yet approved or cleared by the [place] FDA and  has been authorized for detection and/or diagnosis of SARS-CoV-2 by  FDA under an Emergency Use Authorization (EUA). This EUA will remain  in effect (meaning this test can be used) for the duration of the  Covid-19 declaration under Section 564(b)(1) of the Act, 21  U.S.C. section 360bbb-3(b)(1), unless the authorization is  terminated or revoked. Performed at [facility], [address] [place], [place] [address]          Radiology Studies: [place] OR NERVE BLOCK-IMAGE ONLY [facility])  Result Date: 11/04/2020 There is no interpretation for this exam.  This order is for images obtained during a surgical procedure.  Please See "Surgeries" Tab for more information regarding the procedure.        Scheduled Meds: . amLODipine  10 mg Oral Daily  . aspirin EC  81 mg Oral Daily  . atorvastatin  10 mg Oral Daily  . calcium carbonate  1,250 mg Oral BID WC  . Chlorhexidine Gluconate Cloth  6 each Topical Q0600  . Chlorhexidine Gluconate Cloth  6 each Topical Once  . cloNIDine  0.2 mg Oral TID  . [START ON 11/07/2020] epoetin (EPOGEN/PROCRIT)  injection  10,000 Units Intravenous Q M,W,F-HD  . feeding supplement (NEPRO CARB STEADY)  237 mL Oral BID BM  . heparin  5,000 Units Subcutaneous Q8H  . hydrALAZINE  50 mg Oral Q6H  . insulin aspart  0-5 Units Subcutaneous QHS  . insulin aspart  0-6 Units Subcutaneous TID WC  . insulin aspart  2 Units Subcutaneous TID WC  . insulin glargine  15 Units Subcutaneous Daily  . irbesartan  300 mg Oral Daily  . isosorbide mononitrate  120 mg Oral Daily  . ketotifen  1  drop Both Eyes BID  . labetalol  300 mg Oral BID  . multivitamin  1 tablet Oral QHS  . pantoprazole  40 mg Oral BID  . sodium chloride flush  3 mL Intravenous Q12H  . torsemide  100 mg Oral Once per day on Tue Thu Sat  . Vitamin D (Ergocalciferol)  50,000 Units Oral Q7 days   Continuous Infusions: . sodium chloride       LOS: 6 days    Time spent: 25 minutes    Sidney Ace, MD [facility] Pager [phone]  If 7PM-7AM, please contact night-coverage 11/06/2020, 11:08 AM

## 2020-11-06 NOTE — Progress Notes (Signed)
[facility]  ROUNDING NOTE   Subjective:   Patient resting in bed, spouse at the bedside. She reports consuming her breakfast.No worsening SOB,nausea or vomiting.  Objective:  Vital signs in last 24 hours:  Temp:  [98.1 F (36.7 C)-98.9 F (37.2 C)] 98.1 F (36.7 C) (11/07 1155) Pulse Rate:  [76-87] 79 (11/07 1155) Resp:  [18-20] 20 (11/07 1155) BP: (124-150)/(66-76) 129/67 (11/07 1155) SpO2:  [96 %-100 %] 99 % (11/07 1155)  Weight change:  Filed Weights   11/02/20 0153 11/03/20 0431 11/04/20 1138  Weight: 112 kg 106.5 kg 106.5 kg    Intake/Output: I/O last 3 completed shifts: In: 0  Out: 650 [Urine:650]   Intake/Output this shift:  Total I/O In: 120 [P.O.:120] Out: -   Physical Exam: General: In no acute distress  Head: Normocephalic,Atraumatic  Eyes: Anicteric  Lungs:    lungs clear,normal effort  Heart:  S1S2,Regular rate and rhythm   Abdomen:   Non distended,non tender  Extremities:  Trace peripheral edema  Neurologic:  Oriented x3  Skin: No acute lesions or rashes  Access:  Rt IJ Permcath    Basic Metabolic Panel: Recent Labs  Lab 11/02/20 0734 11/02/20 0734 11/02/20 1626 11/02/20 1626 11/03/20 1150 11/04/20 0432 11/05/20 1412  NA 139  --  139  --  138 138 137  K 3.5  --  3.2*  --  3.1* 3.5 4.3  CL 101  --  99  --  98 98 97*  CO2 27  --  29  --  29 29 28   GLUCOSE 95  --  114*  --  110* 110* 134*  BUN 34*  --  29*  --  25* 19 36*  CREATININE 6.94*  --  5.54*  --  5.44* 4.20* 6.69*  CALCIUM 8.4*   < > 8.5*   < > 8.4* 8.6* 8.1*  MG  --   --   --   --  1.9  --   --   PHOS  --   --  3.7  --   --   --  6.6*   < > = values in this interval not displayed.    Liver Function Tests: Recent Labs  Lab 10/31/20 1229 11/02/20 1626 11/05/20 1412  AST 26  --   --   ALT 29  --   --   ALKPHOS 142*  --   --   BILITOT 0.9  --   --   PROT 7.8  --   --   ALBUMIN 3.8 3.4* 3.1*   No results for input(s): LIPASE, AMYLASE in the last 168  hours. No results for input(s): AMMONIA in the last 168 hours.  CBC: Recent Labs  Lab 10/31/20 1229 10/31/20 1229 10/31/20 2326 11/02/20 0734 11/02/20 1626 11/03/20 1150 11/05/20 1412  WBC 11.3*   < > 9.1 6.1 7.5 6.6 7.8  NEUTROABS 9.8*  --   --  4.6  --   --   --   HGB 9.2*   < > 8.8* 7.9* 8.3* 8.5* 7.8*  HCT 31.2*   < > 30.1* 26.8* 28.2* 28.2* 27.3*  MCV 80.6   < > 80.5 81.5 80.8 80.1 83.7  PLT 266   < > 228 188 223 204 203   < > = values in this interval not displayed.    Cardiac Enzymes: No results for input(s): CKTOTAL, CKMB, CKMBINDEX, TROPONINI in the last 168 hours.  BNP: Invalid input(s): POCBNP  CBG: Recent Labs  Lab 11/05/20 0758 11/05/20 1146 11/05/20 1914 11/06/20 0744 11/06/20 1152  GLUCAP 114* 142* 98 126* 94    Microbiology: Results for orders placed or performed during the hospital encounter of 10/31/20  Respiratory Panel by RT PCR (Flu A&B, Covid) - Nasopharyngeal Swab     Status: None   Collection Time: 10/31/20  1:02 PM   Specimen: Nasopharyngeal Swab  Result Value Ref Range Status   SARS Coronavirus 2 by RT PCR NEGATIVE NEGATIVE Final    Comment: (NOTE) SARS-CoV-2 target nucleic acids are NOT DETECTED.  The SARS-CoV-2 RNA is generally detectable in upper respiratoy specimens during the acute phase of infection. The lowest concentration of SARS-CoV-2 viral copies this assay can detect is 131 copies/mL. A negative result does not preclude SARS-Cov-2 infection and should not be used as the sole basis for treatment or other patient management decisions. A negative result may occur with  improper specimen collection/handling, submission of specimen other than nasopharyngeal swab, presence of viral mutation(s) within the areas targeted by this assay, and inadequate number of viral copies (<131 copies/mL). A negative result must be combined with clinical observations, patient history, and epidemiological information. The expected result is  Negative.  Fact Sheet for Patients:  PinkCheek.be  Fact Sheet for Healthcare Providers:  GravelBags.it  This test is no t yet approved or cleared by the [place] FDA and  has been authorized for detection and/or diagnosis of SARS-CoV-2 by FDA under an Emergency Use Authorization (EUA). This EUA will remain  in effect (meaning this test can be used) for the duration of the COVID-19 declaration under Section 564(b)(1) of the Act, 21 U.S.C. section 360bbb-3(b)(1), unless the authorization is terminated or revoked sooner.     Influenza A by PCR NEGATIVE NEGATIVE Final   Influenza B by PCR NEGATIVE NEGATIVE Final    Comment: (NOTE) The Xpert Xpress SARS-CoV-2/FLU/RSV assay is intended as an aid in  the diagnosis of influenza from Nasopharyngeal swab specimens and  should not be used as a sole basis for treatment. Nasal washings and  aspirates are unacceptable for Xpert Xpress SARS-CoV-2/FLU/RSV  testing.  Fact Sheet for Patients: PinkCheek.be  Fact Sheet for Healthcare Providers: GravelBags.it  This test is not yet approved or cleared by the [place] FDA and  has been authorized for detection and/or diagnosis of SARS-CoV-2 by  FDA under an Emergency Use Authorization (EUA). This EUA will remain  in effect (meaning this test can be used) for the duration of the  Covid-19 declaration under Section 564(b)(1) of the Act, 21  U.S.C. section 360bbb-3(b)(1), unless the authorization is  terminated or revoked. Performed at [facility], [address] [place], [place] [address]     Coagulation Studies: No results for input(s): LABPROT, INR in the last 72 hours.  Urinalysis: No results for input(s): COLORURINE, LABSPEC, PHURINE, GLUCOSEU, HGBUR, BILIRUBINUR, KETONESUR, PROTEINUR, UROBILINOGEN, NITRITE, LEUKOCYTESUR in the last 72 hours.  Invalid  input(s): APPERANCEUR    Imaging: No results found.   Medications:   . sodium chloride     . amLODipine  10 mg Oral Daily  . aspirin EC  81 mg Oral Daily  . atorvastatin  10 mg Oral Daily  . calcium carbonate  1,250 mg Oral BID WC  . Chlorhexidine Gluconate Cloth  6 each Topical Q0600  . Chlorhexidine Gluconate Cloth  6 each Topical Once  . cloNIDine  0.2 mg Oral TID  . [START ON 11/07/2020] epoetin (EPOGEN/PROCRIT) injection  10,000 Units Intravenous Q M,W,F-HD  .  feeding supplement (NEPRO CARB STEADY)  237 mL Oral BID BM  . heparin  5,000 Units Subcutaneous Q8H  . hydrALAZINE  50 mg Oral Q6H  . insulin aspart  0-5 Units Subcutaneous QHS  . insulin aspart  0-6 Units Subcutaneous TID WC  . insulin aspart  2 Units Subcutaneous TID WC  . insulin glargine  15 Units Subcutaneous Daily  . irbesartan  300 mg Oral Daily  . isosorbide mononitrate  120 mg Oral Daily  . ketotifen  1 drop Both Eyes BID  . labetalol  300 mg Oral BID  . multivitamin  1 tablet Oral QHS  . pantoprazole  40 mg Oral BID  . sodium chloride flush  3 mL Intravenous Q12H  . torsemide  100 mg Oral Once per day on Tue Thu Sat  . Vitamin D (Ergocalciferol)  50,000 Units Oral Q7 days   sodium chloride, acetaminophen **OR** acetaminophen, calcium carbonate, camphor-menthol **AND** hydrOXYzine, docusate sodium, hydrALAZINE, HYDROcodone-acetaminophen, labetalol, morphine injection, nitroGLYCERIN, sodium chloride flush, sorbitol, zolpidem  Assessment/ Plan  [name] is a 44 y.o. black female with end stage renal disease on hemodialysis, hypertension, insulin dependent diabetes mellitus, who is admitted to [facility] on 10/31/2020 for Hypertensive urgency [I16.0] Elevated brain natriuretic peptide (BNP) level [R79.89] Acute respiratory failure with hypoxia ([place]) [J96.01] Troponin I above reference range [R77.8] Hypervolemia, unspecified hypervolemia type [E87.70]  [facility] MWF [facility] RIJ permcath 115kg  1.  End Stage Renal Disease Volume and electrolyte status acceptable No need for additional dialysis Patient can continue outpatient dialysis schedule if getting discharged today  2. Hypertension:  Normotensive with current antihypertensive regimen  3. Anemia with chronic kidney disease: Hemoglobin 7.8 Continue Epogen with dialysis treatments  4. Secondary Hyperparathyroidism Calcium 8.6 Phosphorus 6.6 on 11/05/2020 Will monitor bone mineral metabolism parameters as outpatient  Crosby Oyster, NP [facility]  11/7/202112:27 PM

## 2020-11-06 NOTE — Discharge Instructions (Signed)
You can receive your Irbesartan and Torsemide at [facility] for $9 a piece without insurance.

## 2020-11-06 NOTE — TOC Transition Note (Addendum)
Transition of Care [facility]) - CM/SW Discharge Note   Patient Details  Name: Renee [name] MRN: [id number] Date of Birth: [birth year]  Transition of Care [facility]) CM/SW Contact:  Izola Price, RN Phone Number: 11/06/2020, 10:34 AM   Clinical Narrative:    [id number] at 0900 for RW. Was not delivered as of 1500. Contacted [employer] and it was enroute within the hour. Simmie Davies RN CM  11/7 Pt called CM back and discharge plans were reviewed. Pt sounds very fatigued. Informed and agreed with [facility]. RW not yet delivered. No other questions/concerns at this time. Simmie Davies RN CM.   11/06/20 1040. Discharge to Home today pending with [facility] PT and RN Services added via [place]. Accepted via Romualdo Bolk covering for [employer]. Spoke with Daughter Sherryll Burger contact as patient is not answering room or mobile phone and updated her on discharge and contact information for [facility]. Confirmed [facility] is set up at home. Updated RN taking care of patient that RW is pending delivery and updated info to pass on to patient or have patient call CM back if questions/concerns. [facility]xygen DME for transport (confirmed by unit RN) and home service set up yesterday via [employer]. Discharge summary/orders ready and [facility] notified. Will attempt to contact patient to make sure to further concerns or questions but other wise CM signing off as of discharge. Simmie Davies RN CM     Final next level of care: [facility] and RN services with [facility].) Barriers to Discharge: Barriers Resolved   Patient Goals and CMS Choice        Discharge Placement                  Name of family member notified: Daughter Dedi Parket notified of [place] changes/updates as patient not answering mobile or room number.    Discharge Plan and Services     Post Acute Care Choice: NA          DME Arranged: [place] (Added per PT rec/ Oxygen DME arranged 11/6 vis [employer].) DME Agency:  [facility] Date DME Agency Contacted: 11/06/20 Time DME Agency Contacted: 0900 Representative spoke with at DME Agency: Pura Spice ([place] [facility]) [place] Arranged: RN, PT [facility] Agency: [place]ella ([place]) Date [place]: 11/06/20 Time [place]: 0930 Representative spoke with at [place]: [place] ([place]) Interventions     Readmission Risk Interventions Readmission Risk Prevention Plan 11/02/2020 10/27/2020  Transportation Screening Complete Complete  PCP or Specialist Appt within 3-5 Days Complete -  Social Work Consult for [place] Planning/Counseling Complete -  [place] Screening Not Applicable -  Medication Review Press photographer) Complete Complete  PCP or Specialist appointment within 3-5 days of discharge - Complete  [place] or [place] - Complete  SW Recovery Care/Counseling Consult - Complete  [place] - Not Applicable  Some recent data might be hidden

## 2020-11-08 ENCOUNTER — Telehealth: Payer: Self-pay | Admitting: Family

## 2020-11-08 ENCOUNTER — Ambulatory Visit: Payer: Medicaid Other | Admitting: Family

## 2020-11-08 NOTE — Telephone Encounter (Signed)
Patient did not show for her Heart Failure Clinic appointment on 11/08/20. Will attempt to reschedule.

## 2020-11-10 ENCOUNTER — Telehealth (INDEPENDENT_AMBULATORY_CARE_PROVIDER_SITE_OTHER): Payer: Self-pay | Admitting: Vascular Surgery

## 2020-11-10 DIAGNOSIS — H538 Other visual disturbances: Secondary | ICD-10-CM | POA: Insufficient documentation

## 2020-11-10 NOTE — Telephone Encounter (Signed)
Called to see if patient had post op visit scheduled. Documentation is not in discharge. Patient had perm/cath insertion done 10/31/20 (JD). Please advise.

## 2020-11-11 NOTE — Telephone Encounter (Signed)
She had an AV fistula placed on 11/05, we typically have them follow up in 6 weeks with an HDA

## 2020-11-11 NOTE — Telephone Encounter (Signed)
Lvm to callback to get scheduled

## 2020-11-12 DIAGNOSIS — I160 Hypertensive urgency: Secondary | ICD-10-CM | POA: Diagnosis not present

## 2020-11-12 DIAGNOSIS — D631 Anemia in chronic kidney disease: Secondary | ICD-10-CM | POA: Diagnosis not present

## 2020-11-12 DIAGNOSIS — I110 Hypertensive heart disease with heart failure: Secondary | ICD-10-CM | POA: Diagnosis not present

## 2020-11-12 DIAGNOSIS — I5033 Acute on chronic diastolic (congestive) heart failure: Secondary | ICD-10-CM | POA: Diagnosis not present

## 2020-11-12 DIAGNOSIS — E1122 Type 2 diabetes mellitus with diabetic chronic kidney disease: Secondary | ICD-10-CM | POA: Diagnosis not present

## 2020-11-12 DIAGNOSIS — Z794 Long term (current) use of insulin: Secondary | ICD-10-CM | POA: Diagnosis not present

## 2020-11-12 DIAGNOSIS — Z9981 Dependence on supplemental oxygen: Secondary | ICD-10-CM | POA: Diagnosis not present

## 2020-11-12 DIAGNOSIS — N186 End stage renal disease: Secondary | ICD-10-CM | POA: Diagnosis not present

## 2020-11-12 DIAGNOSIS — Z9181 History of falling: Secondary | ICD-10-CM | POA: Diagnosis not present

## 2020-11-12 DIAGNOSIS — Z992 Dependence on renal dialysis: Secondary | ICD-10-CM | POA: Diagnosis not present

## 2020-11-12 DIAGNOSIS — I509 Heart failure, unspecified: Secondary | ICD-10-CM | POA: Diagnosis not present

## 2020-11-15 NOTE — Telephone Encounter (Signed)
Called and lvm for patient to call to schedule f/u

## 2020-11-19 DIAGNOSIS — D631 Anemia in chronic kidney disease: Secondary | ICD-10-CM | POA: Diagnosis not present

## 2020-11-19 DIAGNOSIS — N186 End stage renal disease: Secondary | ICD-10-CM | POA: Diagnosis not present

## 2020-11-19 DIAGNOSIS — E1122 Type 2 diabetes mellitus with diabetic chronic kidney disease: Secondary | ICD-10-CM | POA: Diagnosis not present

## 2020-11-19 DIAGNOSIS — I5033 Acute on chronic diastolic (congestive) heart failure: Secondary | ICD-10-CM | POA: Diagnosis not present

## 2020-11-19 DIAGNOSIS — I160 Hypertensive urgency: Secondary | ICD-10-CM | POA: Diagnosis not present

## 2020-11-19 DIAGNOSIS — I110 Hypertensive heart disease with heart failure: Secondary | ICD-10-CM | POA: Diagnosis not present

## 2020-11-21 ENCOUNTER — Telehealth: Payer: Self-pay | Admitting: Family

## 2020-11-21 ENCOUNTER — Ambulatory Visit: Payer: Medicaid Other | Admitting: Family

## 2020-11-21 NOTE — Telephone Encounter (Signed)
Patient did not show for her Heart Failure Clinic appointment on 11/21/20. Will attempt to reschedule.

## 2020-12-12 ENCOUNTER — Other Ambulatory Visit (INDEPENDENT_AMBULATORY_CARE_PROVIDER_SITE_OTHER): Payer: Self-pay | Admitting: Vascular Surgery

## 2020-12-12 DIAGNOSIS — I110 Hypertensive heart disease with heart failure: Secondary | ICD-10-CM | POA: Diagnosis not present

## 2020-12-12 DIAGNOSIS — I160 Hypertensive urgency: Secondary | ICD-10-CM | POA: Diagnosis not present

## 2020-12-12 DIAGNOSIS — Z794 Long term (current) use of insulin: Secondary | ICD-10-CM | POA: Diagnosis not present

## 2020-12-12 DIAGNOSIS — Z9981 Dependence on supplemental oxygen: Secondary | ICD-10-CM | POA: Diagnosis not present

## 2020-12-12 DIAGNOSIS — Z992 Dependence on renal dialysis: Secondary | ICD-10-CM | POA: Diagnosis not present

## 2020-12-12 DIAGNOSIS — Z9889 Other specified postprocedural states: Secondary | ICD-10-CM

## 2020-12-12 DIAGNOSIS — Z9181 History of falling: Secondary | ICD-10-CM | POA: Diagnosis not present

## 2020-12-12 DIAGNOSIS — N186 End stage renal disease: Secondary | ICD-10-CM | POA: Diagnosis not present

## 2020-12-12 DIAGNOSIS — I5033 Acute on chronic diastolic (congestive) heart failure: Secondary | ICD-10-CM | POA: Diagnosis not present

## 2020-12-12 DIAGNOSIS — D631 Anemia in chronic kidney disease: Secondary | ICD-10-CM | POA: Diagnosis not present

## 2020-12-12 DIAGNOSIS — E1122 Type 2 diabetes mellitus with diabetic chronic kidney disease: Secondary | ICD-10-CM | POA: Diagnosis not present

## 2020-12-13 ENCOUNTER — Other Ambulatory Visit: Payer: Self-pay

## 2020-12-13 ENCOUNTER — Ambulatory Visit (INDEPENDENT_AMBULATORY_CARE_PROVIDER_SITE_OTHER): Payer: Medicaid Other

## 2020-12-13 ENCOUNTER — Ambulatory Visit (INDEPENDENT_AMBULATORY_CARE_PROVIDER_SITE_OTHER): Payer: Medicaid Other | Admitting: Nurse Practitioner

## 2020-12-13 ENCOUNTER — Encounter (INDEPENDENT_AMBULATORY_CARE_PROVIDER_SITE_OTHER): Payer: Self-pay | Admitting: Nurse Practitioner

## 2020-12-13 VITALS — BP 177/84 | HR 76 | Resp 16 | Wt 227.0 lb

## 2020-12-13 DIAGNOSIS — Z794 Long term (current) use of insulin: Secondary | ICD-10-CM

## 2020-12-13 DIAGNOSIS — N186 End stage renal disease: Secondary | ICD-10-CM

## 2020-12-13 DIAGNOSIS — Z992 Dependence on renal dialysis: Secondary | ICD-10-CM | POA: Diagnosis not present

## 2020-12-13 DIAGNOSIS — Z9889 Other specified postprocedural states: Secondary | ICD-10-CM

## 2020-12-13 DIAGNOSIS — E1122 Type 2 diabetes mellitus with diabetic chronic kidney disease: Secondary | ICD-10-CM | POA: Diagnosis not present

## 2020-12-13 DIAGNOSIS — E785 Hyperlipidemia, unspecified: Secondary | ICD-10-CM | POA: Diagnosis not present

## 2020-12-13 DIAGNOSIS — N185 Chronic kidney disease, stage 5: Secondary | ICD-10-CM

## 2020-12-13 NOTE — Patient Instructions (Signed)
AV Fistula Placement, Care After This sheet gives you information about how to care for yourself after your procedure. Your health care provider may also give you more specific instructions. If you have problems or questions, contact your health care provider. What can I expect after the procedure? After the procedure, it is common to:  Feel sore.  Feel a vibration (thrill) over the fistula. Follow these instructions at home: Incision care      Follow instructions from your health care provider about how to take care of your incision. Make sure you: ? Wash your hands with soap and water before and after you change your bandage (dressing). If soap and water are not available, use hand sanitizer. ? Change your dressing as told by your health care provider. ? Leave stitches (sutures), skin glue, or adhesive strips in place. These skin closures may need to stay in place for 2 weeks or longer. If adhesive strip edges start to loosen and curl up, you may trim the loose edges. Do not remove adhesive strips completely unless your health care provider tells you to do that. Fistula care  Check your fistula site every day to make sure the thrill feels the same.  Check your fistula site every day for signs of infection. Check for: ? Redness, swelling, or pain. ? Fluid or blood. ? Warmth. ? Pus or a bad smell.  Raise (elevate) the affected area above the level of your heart while you are sitting or lying down.  Do not lift anything that is heavier than 10 lb (4.5 kg), or the limit that you are told, until your health care provider says that it is safe.  Do not lie down on your fistula arm.  Do not let anyone draw blood or take a blood pressure reading on your fistula arm. This is important.  Do not wear tight jewelry or clothing over your fistula arm. Bathing  Do not take baths, swim, or use a hot tub until your health care provider approves. Ask your health care provider if you may take  showers. You may only be allowed to take sponge baths.  Keep the area around your incision clean and dry. Medicines  Take over-the-counter and prescription medicines only as told by your health care provider.  Ask your health care provider if any medicine prescribed to you can cause constipation. You may need to take steps to prevent or treat constipation, such as: ? Drink enough fluid to keep your urine pale yellow. ? Take over-the-counter or prescription medicines. ? Eat foods that are high in fiber, such as beans, whole grains, and fresh fruits and vegetables. ? Limit foods that are high in fat and processed sugars, such as fried or sweet foods. General instructions  Rest at home for a day or two.  Return to your normal activities as told by your health care provider. Ask your health care provider what activities are safe for you.  Keep all follow-up visits as told by your health care provider. This is important. Contact a health care provider if:  You have redness, swelling, or pain around your fistula site.  Your fistula site feels warm to the touch.  You have pus or a bad smell coming from your fistula site.  You have a fever.  You have numbness or coldness at your fistula site.  You feel a decrease or a change in the thrill. Get help right away if you:  Are bleeding from your fistula site.  Have chest   pain.  Have trouble breathing. Summary  Follow instructions from your health care provider about how to take care of your incision.  Do not let anyone draw blood or take a blood pressure reading on your fistula arm. This is important.  Return to your normal activities as told by your health care provider. Ask your health care provider what activities are safe for you.  Contact a health care provider if you have a change in the thrill or have any signs of infection at your fistula site.  Keep all follow-up visits as told by your health care provider. This is  important. This information is not intended to replace advice given to you by your health care provider. Make sure you discuss any questions you have with your health care provider. Document Revised: 06/05/2019 Document Reviewed: 06/23/2018 Elsevier Patient Education  2020 Elsevier Inc.  

## 2020-12-13 NOTE — Progress Notes (Signed)
Subjective:    Patient ID: Renee [name], female    DOB: Apr 10, 1976, 44 y.o.   MRN: [id number] Chief Complaint  Patient presents with  . Follow-up    [facility] 5-6wk HDA    Renee [name] is a 44 year old female that presents today for first look at her dialysis access.  The patient's dialysis access was placed on 11/04/2020 as a left brachiocephalic AV fistula.  The patient denies any fever, chills, nausea, vomiting or diarrhea post surgery.  She has some numbness in the forearm but not on her hand.  She denies any signs symptoms of steal syndrome.  She denies any bleeding or losing from her wound.  The patient has a very good thrill and bruit today however the actual fistula itself is not easily palpable.  It is not quite mature.  Today the patient has a flow volume of 1776.  The AV fistula is patent throughout but there is a focal area of increased velocity at the mid upper arm level but with no vessel narrowing.  It is suspected that there could be a possible frozen valve leaflet present.   Review of Systems  Neurological: Positive for weakness.       Objective:   Physical Exam Vitals reviewed.  HENT:     Head: Normocephalic.  Cardiovascular:     Rate and Rhythm: Normal rate.     Pulses: Normal pulses.     Arteriovenous access: left arteriovenous access is present.    Comments: Good thrill and bruit of left brachiocephalic AV fistula but not readily palpable Pulmonary:     Effort: Pulmonary effort is normal.  Neurological:     Mental Status: She is alert and oriented to person, place, and time.     Motor: Weakness present.  Psychiatric:        Mood and Affect: Mood normal.        Behavior: Behavior normal.        Thought Content: Thought content normal.        Judgment: Judgment normal.     BP (!) 177/84 (BP Location: Right Arm)   Pulse 76   Resp 16   Wt 227 lb (103 kg)   BMI 37.77 kg/m   Past Medical History:  Diagnosis Date  . (HFpEF) heart failure with  preserved ejection fraction ([place])   . CKD (chronic kidney disease), stage IV ([place])   . Diabetes mellitus without complication ([place])   . Hypertension   . Morbid obesity ([place])     Social History   Socioeconomic History  . Marital status: Single    Spouse name: Not on file  . Number of children: Not on file  . Years of education: Not on file  . Highest education level: Not on file  Occupational History  . Not on file  Tobacco Use  . Smoking status: Never Smoker  . Smokeless tobacco: Never Used  Vaping Use  . Vaping Use: Never used  Substance and Sexual Activity  . Alcohol use: No  . Drug use: No  . Sexual activity: Yes  Other Topics Concern  . Not on file  Social History Narrative  . Not on file   Social Determinants of Health   Financial Resource Strain: Not on file  Food Insecurity: Not on file  Transportation Needs: Not on file  Physical Activity: Not on file  Stress: Not on file  Social Connections: Not on file  Intimate Partner Violence: Not on file    Past Surgical  History:  Procedure Laterality Date  . AV FISTULA PLACEMENT Left 11/04/2020   Procedure: ARTERIOVENOUS (AV) FISTULA CREATION;  Surgeon: Algernon Huxley, MD;  Location: [facility] ORS;  Service: Vascular;  Laterality: Left;  . CESAREAN SECTION    . DIALYSIS/PERMA CATHETER INSERTION N/A 10/24/2020   Procedure: DIALYSIS/PERMA CATHETER INSERTION;  Surgeon: Algernon Huxley, MD;  Location: [place] CV [facility];  Service: Cardiovascular;  Laterality: N/A;  . TUBAL LIGATION      Family History  Problem Relation Age of Onset  . Epilepsy Mother   . Hypertension Mother   . Hypertension Father     No Known Allergies  CBC Latest Ref Rng & Units 11/05/2020 11/03/2020 11/02/2020  WBC 4.0 - 10.5 K/uL 7.8 6.6 7.5  Hemoglobin 12.0 - 15.0 g/dL 7.8(L) 8.5(L) 8.3(L)  Hematocrit 36.0 - 46.0 % 27.3(L) 28.2(L) 28.2(L)  Platelets 150 - 400 K/uL 203 204 223      CMP     Component Value Date/Time   NA 137 11/05/2020 1412    K 4.3 11/05/2020 1412   CL 97 (L) 11/05/2020 1412   CO2 28 11/05/2020 1412   GLUCOSE 134 (H) 11/05/2020 1412   BUN 36 (H) 11/05/2020 1412   BUN 30 (A) 04/11/2015 0000   CREATININE 6.69 (H) 11/05/2020 1412   CALCIUM 8.1 (L) 11/05/2020 1412   CALCIUM 6.4 (LL) 10/22/2020 1314   PROT 7.8 10/31/2020 1229   ALBUMIN 3.1 (L) 11/05/2020 1412   AST 26 10/31/2020 1229   ALT 29 10/31/2020 1229   ALKPHOS 142 (H) 10/31/2020 1229   BILITOT 0.9 10/31/2020 1229   GFRNONAA 7 (L) 11/05/2020 1412   GFRAA 12 (L) 01/23/2020 0249     No results found.     Assessment & Plan:   1. ESRD (end stage renal disease) on dialysis [facility]) While the patient has a good thrill and bruit, the fistula is essentially not readily palpable.  Her access was created on 11/05/2019 1/7.  A little over 4 weeks.  We will attempt to get the patient is a 46 weeks to see if her access is able to fully mature.  If not the patient may need to undergo a fistulogram to help with fistula maturation.  2. Type 2 diabetes mellitus with stage 5 chronic kidney disease not on chronic dialysis, with long-term current use of insulin (HCC) Continue hypoglycemic medications as already ordered, these medications have been reviewed and there are no changes at this time.  Hgb A1C to be monitored as already arranged by primary service   3. Hyperlipidemia, unspecified hyperlipidemia type Continue statin as ordered and reviewed, no changes at this time    Current Outpatient Medications on File Prior to Visit  Medication Sig Dispense Refill  . amLODipine (NORVASC) 10 MG tablet Take 10 mg by mouth daily.    . calcitRIOL (ROCALTROL) 0.25 MCG capsule Take 0.25 mcg by mouth every Monday, Wednesday, and Friday.     . calcium carbonate (OS-CAL - DOSED IN MG OF ELEMENTAL CALCIUM) 1250 (500 Ca) MG tablet Take 1 tablet (1,250 mg total) by mouth 2 (two) times daily with a meal. 60 tablet 0  . cloNIDine (CATAPRES) 0.2 MG tablet Take 0.2 mg by mouth 3  (three) times daily.    . insulin aspart (NOVOLOG) 100 UNIT/ML injection Inject 9 Units into the skin 3 (three) times daily before meals.    . insulin glargine (LANTUS) 100 UNIT/ML injection Inject 0.15 mLs (15 Units total) into the skin daily. (Patient  taking differently: Inject 14 Units into the skin 2 (two) times daily.)    . lidocaine-prilocaine (EMLA) cream SMARTSIG:1 Topical Every Night    . Vitamin D, Ergocalciferol, (DRISDOL) 1.25 MG (50000 UNIT) CAPS capsule Take 1 capsule (50,000 Units total) by mouth every 7 (seven) days. 4 capsule 0  . hydrALAZINE (APRESOLINE) 50 MG tablet Take 1 tablet (50 mg total) by mouth 4 (four) times daily. 120 tablet 0  . irbesartan (AVAPRO) 300 MG tablet Take 1 tablet (300 mg total) by mouth daily. 30 tablet 0  . isosorbide mononitrate (IMDUR) 120 MG 24 hr tablet Take 1 tablet (120 mg total) by mouth daily. 30 tablet 0  . labetalol (NORMODYNE) 300 MG tablet Take 1 tablet (300 mg total) by mouth 2 (two) times daily. 60 tablet 0  . pantoprazole (PROTONIX) 40 MG tablet Take 1 tablet (40 mg total) by mouth 2 (two) times daily. 60 tablet 0  . torsemide (DEMADEX) 100 MG tablet Take 1 tablet (100 mg total) by mouth 3 (three) times a week. 12 tablet 0   No current facility-administered medications on file prior to visit.    Patient Instructions  AV Fistula Placement, Care After This sheet gives you information about how to care for yourself after your procedure. Your health care provider may also give you more specific instructions. If you have problems or questions, contact your health care provider. What can I expect after the procedure? After the procedure, it is common to:  Feel sore.  Feel a vibration (thrill) over the fistula. Follow these instructions at home: Incision care      Follow instructions from your health care provider about how to take care of your incision. Make sure you: ? Wash your hands with soap and water before and after you change  your bandage (dressing). If soap and water are not available, use hand sanitizer. ? Change your dressing as told by your health care provider. ? Leave stitches (sutures), skin glue, or adhesive strips in place. These skin closures may need to stay in place for 2 weeks or longer. If adhesive strip edges start to loosen and curl up, you may trim the loose edges. Do not remove adhesive strips completely unless your health care provider tells you to do that. Fistula care  Check your fistula site every day to make sure the thrill feels the same.  Check your fistula site every day for signs of infection. Check for: ? Redness, swelling, or pain. ? Fluid or blood. ? Warmth. ? Pus or a bad smell.  Raise (elevate) the affected area above the level of your heart while you are sitting or lying down.  Do not lift anything that is heavier than 10 lb (4.5 kg), or the limit that you are told, until your health care provider says that it is safe.  Do not lie down on your fistula arm.  Do not let anyone draw blood or take a blood pressure reading on your fistula arm. This is important.  Do not wear tight jewelry or clothing over your fistula arm. Bathing  Do not take baths, swim, or use a hot tub until your health care provider approves. Ask your health care provider if you may take showers. You may only be allowed to take sponge baths.  Keep the area around your incision clean and dry. Medicines  Take over-the-counter and prescription medicines only as told by your health care provider.  Ask your health care provider if any medicine prescribed to  you can cause constipation. You may need to take steps to prevent or treat constipation, such as: ? Drink enough fluid to keep your urine pale yellow. ? Take over-the-counter or prescription medicines. ? Eat foods that are high in fiber, such as beans, whole grains, and fresh fruits and vegetables. ? Limit foods that are high in fat and processed sugars,  such as fried or sweet foods. General instructions  Rest at home for a day or two.  Return to your normal activities as told by your health care provider. Ask your health care provider what activities are safe for you.  Keep all follow-up visits as told by your health care provider. This is important. Contact a health care provider if:  You have redness, swelling, or pain around your fistula site.  Your fistula site feels warm to the touch.  You have pus or a bad smell coming from your fistula site.  You have a fever.  You have numbness or coldness at your fistula site.  You feel a decrease or a change in the thrill. Get help right away if you:  Are bleeding from your fistula site.  Have chest pain.  Have trouble breathing. Summary  Follow instructions from your health care provider about how to take care of your incision.  Do not let anyone draw blood or take a blood pressure reading on your fistula arm. This is important.  Return to your normal activities as told by your health care provider. Ask your health care provider what activities are safe for you.  Contact a health care provider if you have a change in the thrill or have any signs of infection at your fistula site.  Keep all follow-up visits as told by your health care provider. This is important. This information is not intended to replace advice given to you by your health care provider. Make sure you discuss any questions you have with your health care provider. Document Revised: 06/05/2019 Document Reviewed: 06/23/2018 Elsevier Patient Education  [place].   No follow-ups on file.   Kris Hartmann, NP

## 2020-12-29 DIAGNOSIS — E13319 Other specified diabetes mellitus with unspecified diabetic retinopathy without macular edema: Secondary | ICD-10-CM | POA: Diagnosis not present

## 2020-12-29 DIAGNOSIS — H4720 Unspecified optic atrophy: Secondary | ICD-10-CM | POA: Diagnosis not present

## 2021-01-03 ENCOUNTER — Ambulatory Visit (INDEPENDENT_AMBULATORY_CARE_PROVIDER_SITE_OTHER): Payer: Medicaid Other | Admitting: Vascular Surgery

## 2021-01-03 ENCOUNTER — Other Ambulatory Visit: Payer: Self-pay

## 2021-01-03 VITALS — BP 156/74 | HR 70 | Ht 65.0 in | Wt 222.0 lb

## 2021-01-03 DIAGNOSIS — N186 End stage renal disease: Secondary | ICD-10-CM

## 2021-01-03 DIAGNOSIS — Z992 Dependence on renal dialysis: Secondary | ICD-10-CM

## 2021-01-03 DIAGNOSIS — I10 Essential (primary) hypertension: Secondary | ICD-10-CM

## 2021-01-03 NOTE — Assessment & Plan Note (Signed)
Her fistula is healing well.  They can start using the fistula next week.  Once it has been used for 2 or 3 weeks, we can get her catheter out.  We will plan to see her back in the office in 3 months with a follow-up duplex.

## 2021-01-03 NOTE — Assessment & Plan Note (Signed)
A primary cause of her renal failure and blood pressure control important in reducing the progression of atherosclerotic disease. On appropriate oral medications.

## 2021-01-03 NOTE — Progress Notes (Signed)
Patient ID: Renee [name], female   DOB: Oct 14, 1976, 45 y.o.   MRN: [id number]  No chief complaint on file.   HPI Renee [name] is a 45 y.o. female.  Patient returns in follow-up of her dialysis access.  Her left arm AV fistula is now about 8 to 9 weeks postop and is healing well.  Incision is closed.  Excellent thrill was present.  She has not yet started using fistula.   Past Medical History:  Diagnosis Date  . (HFpEF) heart failure with preserved ejection fraction ([place])   . CKD (chronic kidney disease), stage IV ([place])   . Diabetes mellitus without complication ([place])   . Hypertension   . Morbid obesity ([place])     Past Surgical History:  Procedure Laterality Date  . AV FISTULA PLACEMENT Left 11/04/2020   Procedure: ARTERIOVENOUS (AV) FISTULA CREATION;  Surgeon: Algernon Huxley, MD;  Location: [facility] ORS;  Service: Vascular;  Laterality: Left;  . CESAREAN SECTION    . DIALYSIS/PERMA CATHETER INSERTION N/A 10/24/2020   Procedure: DIALYSIS/PERMA CATHETER INSERTION;  Surgeon: Algernon Huxley, MD;  Location: [place] CV LAB;  Service: Cardiovascular;  Laterality: N/A;  . TUBAL LIGATION        No Known Allergies  Current Outpatient Medications  Medication Sig Dispense Refill  . amLODipine (NORVASC) 10 MG tablet Take 10 mg by mouth daily.    . calcitRIOL (ROCALTROL) 0.25 MCG capsule Take 0.25 mcg by mouth every Monday, Wednesday, and Friday.     . calcium carbonate (OS-CAL - DOSED IN MG OF ELEMENTAL CALCIUM) 1250 (500 Ca) MG tablet Take 1 tablet (1,250 mg total) by mouth 2 (two) times daily with a meal. 60 tablet 0  . cloNIDine (CATAPRES) 0.2 MG tablet Take 0.2 mg by mouth 3 (three) times daily.    . hydrALAZINE (APRESOLINE) 50 MG tablet Take 1 tablet (50 mg total) by mouth 4 (four) times daily. 120 tablet 0  . insulin aspart (NOVOLOG) 100 UNIT/ML injection Inject 9 Units into the skin 3 (three) times daily before meals.    . insulin glargine (LANTUS) 100 UNIT/ML injection Inject  0.15 mLs (15 Units total) into the skin daily. (Patient taking differently: Inject 14 Units into the skin 2 (two) times daily.)    . irbesartan (AVAPRO) 300 MG tablet Take 1 tablet (300 mg total) by mouth daily. 30 tablet 0  . isosorbide mononitrate (IMDUR) 120 MG 24 hr tablet Take 1 tablet (120 mg total) by mouth daily. 30 tablet 0  . labetalol (NORMODYNE) 300 MG tablet Take 1 tablet (300 mg total) by mouth 2 (two) times daily. 60 tablet 0  . lidocaine-prilocaine (EMLA) cream SMARTSIG:1 Topical Every Night    . pantoprazole (PROTONIX) 40 MG tablet Take 1 tablet (40 mg total) by mouth 2 (two) times daily. 60 tablet 0  . torsemide (DEMADEX) 100 MG tablet Take 1 tablet (100 mg total) by mouth 3 (three) times a week. 12 tablet 0  . Vitamin D, Ergocalciferol, (DRISDOL) 1.25 MG (50000 UNIT) CAPS capsule Take 1 capsule (50,000 Units total) by mouth every 7 (seven) days. 4 capsule 0   No current facility-administered medications for this visit.        Physical Exam There were no vitals taken for this visit. Gen:  WD/WN, NAD Skin: incision C/D/I Ext: Excellent thrill in left arm AV fistula.     Assessment/Plan:  ESRD (end stage renal disease) on dialysis [facility]) Her fistula is healing well.  They can start  using the fistula next week.  Once it has been used for 2 or 3 weeks, we can get her catheter out.  We will plan to see her back in the office in 3 months with a follow-up duplex.  HTN (hypertension) A primary cause of her renal failure and blood pressure control important in reducing the progression of atherosclerotic disease. On appropriate oral medications.       [name] 01/03/2021, 9:33 AM   This note was created with Dragon medical transcription system.  Any errors from dictation are unintentional.

## 2021-01-10 ENCOUNTER — Telehealth (INDEPENDENT_AMBULATORY_CARE_PROVIDER_SITE_OTHER): Payer: Self-pay

## 2021-01-10 NOTE — Telephone Encounter (Signed)
Patient has been rescheduled to 01/24/21 with Dr. Delana Meyer for a permcath removal with the arrival time of 11:00 am. This was rescheduled per [facility]. Patient will do covid testing on 01/20/21 at the [place] between 8-1 pm. Anderson Malta was informed.

## 2021-01-10 NOTE — Telephone Encounter (Signed)
I attempted to contact the patient and a message was left for a return call. The patient was scheduled for a permcath removal on 01/23/21 with a 11:00 am arrival time to the [facility]. Covid testing on 01/19/21 between 8-1 pm at the [facility]. Pre-procedure instructions will be faxed to [facility]

## 2021-01-19 ENCOUNTER — Encounter (INDEPENDENT_AMBULATORY_CARE_PROVIDER_SITE_OTHER): Payer: Self-pay

## 2021-01-24 ENCOUNTER — Other Ambulatory Visit (INDEPENDENT_AMBULATORY_CARE_PROVIDER_SITE_OTHER): Payer: Self-pay | Admitting: Nurse Practitioner

## 2021-01-24 DIAGNOSIS — N186 End stage renal disease: Secondary | ICD-10-CM

## 2021-01-27 ENCOUNTER — Other Ambulatory Visit: Payer: Self-pay

## 2021-01-27 ENCOUNTER — Other Ambulatory Visit
Admission: RE | Admit: 2021-01-27 | Discharge: 2021-01-27 | Disposition: A | Discharge status: Home / Self Care | Payer: Medicaid Other | Source: Ambulatory Visit | Attending: Vascular Surgery | Admitting: Vascular Surgery

## 2021-01-27 DIAGNOSIS — Z20822 Contact with and (suspected) exposure to covid-19: Secondary | ICD-10-CM | POA: Insufficient documentation

## 2021-01-27 DIAGNOSIS — Z01812 Encounter for preprocedural laboratory examination: Secondary | ICD-10-CM | POA: Insufficient documentation

## 2021-01-28 LAB — SARS CORONAVIRUS 2 (TAT 6-24 HRS): SARS Coronavirus 2: NEGATIVE

## 2021-01-31 ENCOUNTER — Ambulatory Visit
Admission: RE | Admit: 2021-01-31 | Discharge: 2021-01-31 | Disposition: A | Discharge status: Home / Self Care | Payer: Medicare Other | Attending: Vascular Surgery | Admitting: Vascular Surgery

## 2021-01-31 ENCOUNTER — Encounter
Admission: RE | Disposition: A | Discharge status: Home / Self Care | Payer: Self-pay | Source: Home / Self Care | Attending: Vascular Surgery

## 2021-01-31 DIAGNOSIS — Z888 Allergy status to other drugs, medicaments and biological substances status: Secondary | ICD-10-CM | POA: Insufficient documentation

## 2021-01-31 DIAGNOSIS — Z4901 Encounter for fitting and adjustment of extracorporeal dialysis catheter: Secondary | ICD-10-CM | POA: Insufficient documentation

## 2021-01-31 DIAGNOSIS — I132 Hypertensive heart and chronic kidney disease with heart failure and with stage 5 chronic kidney disease, or end stage renal disease: Secondary | ICD-10-CM | POA: Diagnosis not present

## 2021-01-31 DIAGNOSIS — E1122 Type 2 diabetes mellitus with diabetic chronic kidney disease: Secondary | ICD-10-CM | POA: Diagnosis not present

## 2021-01-31 DIAGNOSIS — N185 Chronic kidney disease, stage 5: Secondary | ICD-10-CM

## 2021-01-31 DIAGNOSIS — I5032 Chronic diastolic (congestive) heart failure: Secondary | ICD-10-CM | POA: Diagnosis not present

## 2021-01-31 DIAGNOSIS — N186 End stage renal disease: Secondary | ICD-10-CM | POA: Insufficient documentation

## 2021-01-31 HISTORY — PX: DIALYSIS/PERMA CATHETER REMOVAL: CATH118289

## 2021-01-31 SURGERY — DIALYSIS/PERMA CATHETER REMOVAL
Anesthesia: LOCAL

## 2021-01-31 SURGICAL SUPPLY — 2 items
FORCEPS HALSTEAD CVD 5IN STRL (INSTRUMENTS) ×2 IMPLANT
TRAY LACERAT/PLASTIC (MISCELLANEOUS) ×2 IMPLANT

## 2021-01-31 NOTE — Discharge Instructions (Signed)
Tunneled Catheter Removal, Care After Refer to this sheet in the next few weeks. These instructions provide you with information about caring for yourself after your procedure. Your health care provider may also give you more specific instructions. Your treatment has been planned according to current medical practices, but problems sometimes occur. Call your health care provider if you have any problems or questions after your procedure. What can I expect after the procedure? After the procedure, it is common to have: Some mild redness, swelling, and pain around your catheter site.   Follow these instructions at home: Incision care  Check your removal site  every day for signs of infection. Check for: More redness, swelling, or pain. More fluid or blood. Warmth. Pus or a bad smell. Remove your dressing in 48hrs leave open to air  Activity  Return to your normal activities as told by your health care provider. Ask your health care provider what activities are safe for you. Do not lift anything that is heavier than 10 lb (4.5 kg) for 3 days  You may shower tomorrow  Contact a health care provider if: You have more fluid or blood coming from your removal site You have more redness, swelling, or pain at your incisions or around the area where your catheter was removed Your removal site feel warm to the touch. You feel unusually weak. You feel nauseous.. Get help right away if You have swelling in your arm, shoulder, neck, or face. You develop chest pain. You have difficulty breathing. You feel dizzy or light-headed. You have pus or a bad smell coming from your removal site You have a fever. You develop bleeding from your removal site, and your bleeding does not stop. This information is not intended to replace advice given to you by your health care provider. Make sure you discuss any questions you have with your health care provider. Document Released: 12/03/2012 Document Revised:  08/19/2016 Document Reviewed: 09/12/2015 Elsevier Interactive Patient Education  2017 Elsevier Inc. 

## 2021-01-31 NOTE — Op Note (Signed)
Operative Note   Preoperative diagnosis:    1. ESRD with functional permanent access  Postoperative diagnosis:   1. ESRD with functional permanent access  Procedure:  Removal of RIGHT Permcath  Performing Clinician: Hezzie Bump PA-C  Surgeon:  [name], MD  Anesthesia:  Local  EBL:  Minimal  Indication for the Procedure:  The patient has a functional permanent dialysis access and no longer needs their permcath.  This can be removed.  Risks and benefits are discussed and informed consent is obtained.  Description of the Procedure:  The patient's RIGHT neck, chest and existing catheter were sterilely prepped and draped. The area around the catheter was anesthetized copiously with 1% lidocaine. The catheter was dissected out with curved hemostats until the cuff was freed from the surrounding fibrous sheath. The fiber sheath was transected, and the catheter was then removed in its entirety using gentle traction. Pressure was held and sterile dressings were placed. The patient tolerated the procedure well and was taken to the recovery room in stable condition.  Chelsea  01/31/2021, 2:01 PM  This note was created with Dragon Medical transcription system. Any errors in dictation are purely unintentional.

## 2021-01-31 NOTE — H&P (Signed)
[facility] Admission History & Physical  MRN : [id number]  Renee [name] is a 45 y.o. (May 01, 1976) female who presents with chief complaint of No chief complaint on file. Marland Kitchen  History of Present Illness: I am asked to evaluate the patient by the dialysis center. The patient was sent here because they have a nonfunctioning tunneled catheter and a functioning arm access on the left.  The patient reports they're not been any problems with any of their dialysis runs. They are reporting good flows with good parameters at dialysis.  Patient denies pain or tenderness overlying the access.  There is no pain with dialysis.  The patient denies hand pain or finger pain consistent with steal syndrome.  No fevers or chills while on dialysis.   No current facility-administered medications for this encounter.    Past Medical History:  Diagnosis Date  . (HFpEF) heart failure with preserved ejection fraction ([place])   . CKD (chronic kidney disease), stage IV ([place])   . Diabetes mellitus without complication ([place])   . Hypertension   . Morbid obesity ([place])     Past Surgical History:  Procedure Laterality Date  . AV FISTULA PLACEMENT Left 11/04/2020   Procedure: ARTERIOVENOUS (AV) FISTULA CREATION;  Surgeon: Algernon Huxley, MD;  Location: [facility];  Service: Vascular;  Laterality: Left;  . CESAREAN SECTION    . DIALYSIS/PERMA CATHETER INSERTION N/A 10/24/2020   Procedure: DIALYSIS/PERMA CATHETER INSERTION;  Surgeon: Algernon Huxley, MD;  Location: [facility];  Service: Cardiovascular;  Laterality: N/A;  . TUBAL LIGATION       Social History   Tobacco Use  . Smoking status: Never Smoker  . Smokeless tobacco: Never Used  Vaping Use  . Vaping Use: Never used  Substance Use Topics  . Alcohol use: No  . Drug use: No     Family History  Problem Relation Age of Onset  . Epilepsy Mother   . Hypertension Mother   . Hypertension Father     No family history of  bleeding or clotting disorders, autoimmune disease or porphyria  Allergies  Allergen Reactions  . Lisinopril Swelling     REVIEW OF SYSTEMS (Negative unless checked)  Constitutional: '[]'$ Weight loss  '[]'$ Fever  '[]'$ Chills Cardiac: '[]'$ Chest pain   '[]'$ Chest pressure   '[]'$ Palpitations   '[]'$ Shortness of breath when laying flat   '[]'$ Shortness of breath at rest   '[x]'$ Shortness of breath with exertion. Vascular:  '[]'$ Pain in legs with walking   '[]'$ Pain in legs at rest   '[]'$ Pain in legs when laying flat   '[]'$ Claudication   '[]'$ Pain in feet when walking  '[]'$ Pain in feet at rest  '[]'$ Pain in feet when laying flat   '[]'$ History of DVT   '[]'$ Phlebitis   '[]'$ Swelling in legs   '[]'$ Varicose veins   '[]'$ Non-healing ulcers Pulmonary:   '[]'$ Uses home oxygen   '[]'$ Productive cough   '[]'$ Hemoptysis   '[]'$ Wheeze  '[]'$ COPD   '[]'$ Asthma Neurologic:  '[]'$ Dizziness  '[]'$ Blackouts   '[]'$ Seizures   '[]'$ History of stroke   '[]'$ History of TIA  '[]'$ Aphasia   '[]'$ Temporary blindness   '[]'$ Dysphagia   '[]'$ Weakness or numbness in arms   '[]'$ Weakness or numbness in legs Musculoskeletal:  '[]'$ Arthritis   '[]'$ Joint swelling   '[]'$ Joint pain   '[]'$ Low back pain Hematologic:  '[]'$ Easy bruising  '[]'$ Easy bleeding   '[]'$ Hypercoagulable state   '[x]'$ Anemic  '[]'$ Hepatitis Gastrointestinal:  '[]'$ Blood in stool   '[]'$ Vomiting blood  '[]'$ Gastroesophageal reflux/heartburn   '[]'$ Difficulty swallowing. Genitourinary:  '[x]'$ Chronic kidney disease   '[]'$   Difficult urination  '[]'$ Frequent urination  '[]'$ Burning with urination   '[]'$ Blood in urine Skin:  '[]'$ Rashes   '[]'$ Ulcers   '[]'$ Wounds Psychological:  '[]'$ History of anxiety   '[]'$  History of major depression.  Physical Examination  Vitals:   01/31/21 1338 01/31/21 1400  BP:  (!) 114/51  Pulse: 70 67  Resp: 15   Temp: 98.1 F (36.7 C)   TempSrc: Oral   SpO2: 100% 100%   There is no height or weight on file to calculate BMI. Gen: WD/WN, NAD Head: [place]/AT, No temporalis wasting.  Ear/Nose/Throat: Hearing grossly intact, nares w/o erythema or drainage, oropharynx w/o Erythema/Exudate,   Eyes: Conjunctiva clear, sclera non-icteric Neck: Trachea midline.  No JVD.  Pulmonary:  Good air movement, respirations not labored, no use of accessory muscles.  Cardiac: RRR, normal S1, S2. Vascular: good thrill left arm AVF Vessel Right Left  Radial Palpable Palpable  Ulnar Not Palpable Not Palpable  Brachial Palpable Palpable  Carotid Palpable, without bruit Palpable, without bruit  Gastrointestinal: soft, non-tender/non-distended. No guarding/reflex.  Musculoskeletal: M/S 5/5 throughout.  Extremities without ischemic changes.  No deformity or atrophy.  Neurologic: Sensation grossly intact in extremities.  Symmetrical.  Speech is fluent. Motor exam as listed above. Psychiatric: Judgment intact, Mood & affect appropriate for pt's clinical situation. Dermatologic: No rashes or ulcers noted.  No cellulitis or open wounds. Lymph : No Cervical, Axillary, or Inguinal lymphadenopathy.   CBC Lab Results  Component Value Date   WBC 7.8 11/05/2020   HGB 7.8 (L) 11/05/2020   HCT 27.3 (L) 11/05/2020   MCV 83.7 11/05/2020   PLT 203 11/05/2020    BMET    Component Value Date/Time   NA 137 11/05/2020 1412   K 4.3 11/05/2020 1412   CL 97 (L) 11/05/2020 1412   CO2 28 11/05/2020 1412   GLUCOSE 134 (H) 11/05/2020 1412   BUN 36 (H) 11/05/2020 1412   BUN 30 (A) 04/11/2015 0000   CREATININE 6.69 (H) 11/05/2020 1412   CALCIUM 8.1 (L) 11/05/2020 1412   CALCIUM 6.4 (LL) 10/22/2020 1314   GFRNONAA 7 (L) 11/05/2020 1412   GFRAA 12 (L) 01/23/2020 0249   CrCl cannot be calculated (Patient's most recent lab result is older than the maximum 21 days allowed.).  COAG Lab Results  Component Value Date   INR 1.1 11/03/2020   INR 1.3 (H) 10/24/2020   INR 1.3 (H) 10/21/2020    Radiology PERIPHERAL VASCULAR CATHETERIZATION  Result Date: 01/31/2021 See op note   Assessment/Plan 1.  Complication dialysis device:  Patient's Tunneled catheter is not being used. The patient has an extremity  access that is functioning well. Therefore, the patient will undergo removal of the tunneled catheter under local anesthesia.  The risks and benefits were described to the patient.  All questions were answered.  The patient agrees to proceed with angiography and intervention. Potassium will be drawn to ensure that it is an appropriate level prior to performing intervention. 2.  End-stage renal disease requiring hemodialysis:  Patient will continue dialysis therapy without further interruption 3.  Hypertension:  Patient will continue medical management; nephrology is following no changes in oral medications. 4. Diabetes mellitus:  Glucose will be monitored and oral medications been held this morning once the patient has undergone the patient's procedure po intake will be reinitiated and again Accu-Cheks will be used to assess the blood glucose level and treat as needed. The patient will be restarted on the patient's usual hypoglycemic regime    Corene Cornea  Lucky Cowboy, MD  01/31/2021 4:26 PM

## 2021-02-01 ENCOUNTER — Encounter: Payer: Self-pay | Admitting: Vascular Surgery

## 2021-03-20 ENCOUNTER — Ambulatory Visit: Payer: Medicare Other | Admitting: Surgery

## 2021-03-30 ENCOUNTER — Ambulatory Visit: Payer: Medicare Other | Admitting: Surgery

## 2021-03-31 ENCOUNTER — Encounter: Payer: Self-pay | Admitting: *Deleted

## 2021-04-04 ENCOUNTER — Ambulatory Visit (INDEPENDENT_AMBULATORY_CARE_PROVIDER_SITE_OTHER): Payer: Medicare Other | Admitting: Vascular Surgery

## 2021-04-04 ENCOUNTER — Encounter (INDEPENDENT_AMBULATORY_CARE_PROVIDER_SITE_OTHER): Payer: Medicare Other

## 2021-04-11 ENCOUNTER — Ambulatory Visit: Payer: Medicare Other | Admitting: Surgery

## 2021-05-24 ENCOUNTER — Telehealth (INDEPENDENT_AMBULATORY_CARE_PROVIDER_SITE_OTHER): Payer: Self-pay | Admitting: Vascular Surgery

## 2021-06-01 NOTE — Telephone Encounter (Signed)
Documentation

## 2021-11-22 ENCOUNTER — Other Ambulatory Visit: Payer: Self-pay

## 2021-11-22 ENCOUNTER — Emergency Department
Admission: EM | Admit: 2021-11-22 | Discharge: 2021-11-23 | Disposition: A | Discharge status: Home / Self Care | Payer: Medicare Other | Attending: Emergency Medicine | Admitting: Emergency Medicine

## 2021-11-22 ENCOUNTER — Emergency Department: Payer: Medicare Other

## 2021-11-22 DIAGNOSIS — Z794 Long term (current) use of insulin: Secondary | ICD-10-CM | POA: Insufficient documentation

## 2021-11-22 DIAGNOSIS — Z79899 Other long term (current) drug therapy: Secondary | ICD-10-CM | POA: Insufficient documentation

## 2021-11-22 DIAGNOSIS — Z20822 Contact with and (suspected) exposure to covid-19: Secondary | ICD-10-CM | POA: Diagnosis not present

## 2021-11-22 DIAGNOSIS — I5033 Acute on chronic diastolic (congestive) heart failure: Secondary | ICD-10-CM | POA: Insufficient documentation

## 2021-11-22 DIAGNOSIS — Z992 Dependence on renal dialysis: Secondary | ICD-10-CM | POA: Insufficient documentation

## 2021-11-22 DIAGNOSIS — N939 Abnormal uterine and vaginal bleeding, unspecified: Secondary | ICD-10-CM | POA: Diagnosis not present

## 2021-11-22 DIAGNOSIS — N186 End stage renal disease: Secondary | ICD-10-CM | POA: Insufficient documentation

## 2021-11-22 DIAGNOSIS — J101 Influenza due to other identified influenza virus with other respiratory manifestations: Secondary | ICD-10-CM | POA: Diagnosis not present

## 2021-11-22 DIAGNOSIS — D649 Anemia, unspecified: Secondary | ICD-10-CM | POA: Insufficient documentation

## 2021-11-22 DIAGNOSIS — I132 Hypertensive heart and chronic kidney disease with heart failure and with stage 5 chronic kidney disease, or end stage renal disease: Secondary | ICD-10-CM | POA: Insufficient documentation

## 2021-11-22 DIAGNOSIS — E1122 Type 2 diabetes mellitus with diabetic chronic kidney disease: Secondary | ICD-10-CM | POA: Diagnosis not present

## 2021-11-22 LAB — CBC WITH DIFFERENTIAL/PLATELET
Abs Immature Granulocytes: 0.09 10*3/uL — ABNORMAL HIGH (ref 0.00–0.07)
Basophils Absolute: 0 10*3/uL (ref 0.0–0.1)
Basophils Relative: 0 %
Eosinophils Absolute: 0.3 10*3/uL (ref 0.0–0.5)
Eosinophils Relative: 3 %
HCT: 19 % — ABNORMAL LOW (ref 36.0–46.0)
Hemoglobin: 6.1 g/dL — ABNORMAL LOW (ref 12.0–15.0)
Immature Granulocytes: 1 %
Lymphocytes Relative: 9 %
Lymphs Abs: 1 10*3/uL (ref 0.7–4.0)
MCH: 30.7 pg (ref 26.0–34.0)
MCHC: 32.1 g/dL (ref 30.0–36.0)
MCV: 95.5 fL (ref 80.0–100.0)
Monocytes Absolute: 0.5 10*3/uL (ref 0.1–1.0)
Monocytes Relative: 4 %
Neutro Abs: 10 10*3/uL — ABNORMAL HIGH (ref 1.7–7.7)
Neutrophils Relative %: 83 %
Platelets: 280 10*3/uL (ref 150–400)
RBC: 1.99 MIL/uL — ABNORMAL LOW (ref 3.87–5.11)
RDW: 15.2 % (ref 11.5–15.5)
WBC: 11.9 10*3/uL — ABNORMAL HIGH (ref 4.0–10.5)
nRBC: 0 % (ref 0.0–0.2)

## 2021-11-22 LAB — RESP PANEL BY RT-PCR (FLU A&B, COVID) ARPGX2
Influenza A by PCR: POSITIVE — AB
Influenza B by PCR: NEGATIVE
SARS Coronavirus 2 by RT PCR: NEGATIVE

## 2021-11-22 LAB — BASIC METABOLIC PANEL
Anion gap: 9 (ref 5–15)
BUN: 13 mg/dL (ref 6–20)
CO2: 29 mmol/L (ref 22–32)
Calcium: 8.2 mg/dL — ABNORMAL LOW (ref 8.9–10.3)
Chloride: 97 mmol/L — ABNORMAL LOW (ref 98–111)
Creatinine, Ser: 4.81 mg/dL — ABNORMAL HIGH (ref 0.44–1.00)
GFR, Estimated: 11 mL/min — ABNORMAL LOW (ref >60)
Glucose, Bld: 153 mg/dL — ABNORMAL HIGH (ref 70–99)
Potassium: 3 mmol/L — ABNORMAL LOW (ref 3.5–5.1)
Sodium: 135 mmol/L (ref 135–145)

## 2021-11-22 LAB — PREPARE RBC (CROSSMATCH)

## 2021-11-22 MED ORDER — BENZONATATE 100 MG PO CAPS
100.0000 mg | ORAL_CAPSULE | Freq: Once | ORAL | Status: AC
  Administered 2021-11-22: 100 mg via ORAL
  Filled 2021-11-22: qty 1

## 2021-11-22 MED ORDER — DESOGESTREL-ETHINYL ESTRADIOL 0.15-30 MG-MCG PO TABS: ORAL_TABLET | ORAL | 0 refills | Status: DC

## 2021-11-22 MED ORDER — SODIUM CHLORIDE 0.9 % IV SOLN: 10.0000 mL/h | Freq: Once | INTRAVENOUS | Status: DC

## 2021-11-22 NOTE — ED Provider Notes (Signed)
[facility] Emergency Department Provider Note   ____________________________________________   I have reviewed the triage vital signs and the nursing notes.   HISTORY  Chief Complaint Abnormal Lab and Vaginal Bleeding   History limited by: Not Limited   HPI Renee [name] is a 45 y.o. female who presents to the emergency department today at the advice of dialysis because of concerns for anemia.  The patient states that she was seen in an outside emergency department roughly 10 days ago.  She went because of weakness and was found to be anemic at that time.  She was given blood transfusions and then followed up with OB/GYN.  She followed up with OB/GYN because she had noticed some increasing vaginal bleeding since October. Had US done through ob/gyn which showed fibroids.  She states that the bleeding has actually started to slow down slightly.  She has noticed some increased shortness of breath with exertion.     Records reviewed. Per medical record review patient has a history of CKD, DM, HTN, ESRD on dialysis.   Past Medical History:  Diagnosis Date   (HFpEF) heart failure with preserved ejection fraction ([place])    CKD (chronic kidney disease), stage IV (HCC)    Diabetes mellitus without complication ([place])    Hypertension    Morbid obesity ([place])     Patient Active Problem List   Diagnosis Date Noted   Blurry vision, bilateral 11/10/2020   Hypertensive urgency 10/31/2020   (HFpEF) heart failure with preserved ejection fraction ([place])    ESRD (end stage renal disease) on dialysis ([place]) 10/26/2020   Acute CHF (congestive heart failure) ([place]) 10/21/2020   ARF (acute renal failure) ([place]) 10/21/2020   Type II diabetes mellitus with renal manifestations ([place]) 10/21/2020   Chest pain 10/21/2020   Elevated troponin 10/21/2020   UTI (urinary tract infection) 10/21/2020   Hypocalcemia 10/21/2020   HLD (hyperlipidemia) 10/21/2020   Anasarca associated with  disorder of kidney    Anemia    Hyperglycemic hyperosmolar nonketotic coma ([place])    Status epilepticus ([place]) 01/19/2020   Benign hypertensive kidney disease with chronic kidney disease 11/16/2019   Anemia in chronic kidney disease 09/25/2019   Proteinuria 09/25/2019   Secondary hyperparathyroidism of renal origin ([place]) 09/25/2019   Acute pulmonary edema ([place]) 08/27/2019   HTN (hypertension) 01/29/2019   Chronic diastolic heart failure with preserved ejection fraction ([place]) 08/14/2017   Hypertensive emergency 07/05/2017   Malignant hypertension 06/16/2017   Acute on chronic renal failure ([place]) 06/14/2017   Hyponatremia 06/14/2017   Hypokalemia 06/14/2017   Leg swelling 06/14/2017   Cardiomyopathy ([place]) 04/08/2015   Accelerated hypertension 04/08/2015   Diabetes mellitus, type 2 ([place]) 04/08/2015    Past Surgical History:  Procedure Laterality Date   AV FISTULA PLACEMENT Left 11/04/2020   Procedure: ARTERIOVENOUS (AV) FISTULA CREATION;  Surgeon: Algernon Huxley, MD;  Location: [facility]S;  Service: Vascular;  Laterality: Left;   CESAREAN SECTION     DIALYSIS/PERMA CATHETER INSERTION N/A 10/24/2020   Procedure: DIALYSIS/PERMA CATHETER INSERTION;  Surgeon: Algernon Huxley, MD;  Location: [facility];  Service: Cardiovascular;  Laterality: N/A;   DIALYSIS/PERMA CATHETER REMOVAL N/A 01/31/2021   Procedure: DIALYSIS/PERMA CATHETER REMOVAL;  Surgeon: Algernon Huxley, MD;  Location: [facility];  Service: Cardiovascular;  Laterality: N/A;   TUBAL LIGATION      Prior to Admission medications   Medication Sig Start Date End Date Taking? Authorizing Provider  amLODipine (NORVASC) 10 MG tablet  Take 10 mg by mouth daily.    [provider]  BD INSULIN SYRINGE U/F 31G X 5/16" 0.3 ML MISC  11/10/20   [provider]  BD ULTRA-FINE PEN NEEDLES 29G X 12.7MM [place]  11/10/20   [provider]  BD VEO INSULIN SYRINGE U/F 31G X 15/64" 0.3 ML MISC  12/08/20   [provider]  calcitRIOL (ROCALTROL) 0.25 MCG capsule Take 0.25 mcg by mouth every Monday, Wednesday, and Friday.     [provider]  calcium carbonate (OS-CAL - DOSED IN MG OF ELEMENTAL CALCIUM) 1250 (500 Ca) MG tablet Take 1 tablet (1,250 mg total) by mouth 2 (two) times daily with a meal. 10/27/20   Jennye Boroughs, MD  cloNIDine (CATAPRES) 0.2 MG tablet Take 0.2 mg by mouth 3 (three) times daily.    [provider]  Cysteamine Bitartrate (PROCYSBI) 300 MG PACK Use 1 each 3 (three) times daily Use as instructed. 12/13/20 12/13/21  [provider]  glucose blood (PRECISION QID TEST) test strip Use 3 (three) times daily Use as instructed. 12/13/20 12/13/21  [provider]  HUMALOG KWIKPEN 100 UNIT/ML KwikPen Inject into the skin. 12/08/20   [provider]  hydrALAZINE (APRESOLINE) 50 MG tablet Take 1 tablet (50 mg total) by mouth 4 (four) times daily. 11/05/20 12/05/20  Sidney Ace, MD  insulin aspart (NOVOLOG) 100 UNIT/ML injection Inject 9 Units into the skin 3 (three) times daily before meals.    [provider]  insulin glargine (LANTUS) 100 UNIT/ML injection Inject 0.15 mLs (15 Units total) into the skin daily. Patient taking differently: Inject 14 Units into the skin 2 (two) times daily. 10/28/20   Jennye Boroughs, MD  insulin lispro (HUMALOG) 100 UNIT/ML KwikPen Inject into the skin. 12/08/20 12/08/21  [provider]  Insulin Syringe-Needle U-100 (INSULIN SYRINGE .3CC/31GX5/16") 31G X 5/16" 0.3 ML MISC See admin instructions. 12/08/20 12/08/21  [provider]  irbesartan (AVAPRO) 300 MG tablet Take 1 tablet (300 mg total) by mouth daily. 11/05/20 12/05/20  Sidney Ace, MD  isosorbide mononitrate (IMDUR) 120 MG 24 hr tablet Take 1 tablet (120 mg total) by mouth daily. 11/05/20 12/05/20  Sidney Ace, MD  labetalol (NORMODYNE) 300 MG tablet Take 1 tablet (300 mg total) by mouth 2 (two) times daily. 11/05/20  12/05/20  Sidney Ace, MD  lidocaine-prilocaine (EMLA) cream SMARTSIG:1 Topical Every Night 12/07/20   [provider]  pantoprazole (PROTONIX) 40 MG tablet Take 1 tablet (40 mg total) by mouth 2 (two) times daily. 11/05/20 12/05/20  Sidney Ace, MD  pravastatin (PRAVACHOL) 20 MG tablet Take by mouth. 12/13/20   [provider]  pravastatin (PRAVACHOL) 20 MG tablet Take 20 mg by mouth at bedtime. 12/08/20   [provider]  sodium bicarbonate 650 MG tablet Take by mouth. 12/13/20   [provider]  torsemide (DEMADEX) 100 MG tablet Take 1 tablet (100 mg total) by mouth 3 (three) times a week. 11/08/20 12/08/20  Sidney Ace, MD  triamcinolone (KENALOG) 0.1 % Apply topically. 12/13/20   [provider]  triamcinolone (KENALOG) 0.1 % SMARTSIG:1 Application Topical 2-3 Times Daily 12/13/20   [provider]  Vitamin D, Ergocalciferol, (DRISDOL) 1.25 MG (50000 UNIT) CAPS capsule Take 1 capsule (50,000 Units total) by mouth every 7 (seven) days. 10/30/20   Jennye Boroughs, MD    Allergies Lisinopril  Family History  Problem Relation Age of Onset   Epilepsy Mother  Hypertension Mother    Hypertension Father     Social History Social History   Tobacco Use   Smoking status: Never   Smokeless tobacco: Never  Vaping Use   Vaping Use: Never used  Substance Use Topics   Alcohol use: No   Drug use: No    Review of Systems Constitutional: No fever/chills Eyes: No visual changes. ENT: No sore throat. Cardiovascular: Denies chest pain. Respiratory: Positive for shortness of breath. Gastrointestinal: No abdominal pain.  No nausea, no vomiting.  No diarrhea.   Genitourinary: Positive for vaginal bleeding. Musculoskeletal: Negative for back pain. Skin: Negative for rash. Neurological: Negative for headaches, focal weakness or numbness.  ____________________________________________   PHYSICAL EXAM:  VITAL  SIGNS: ED Triage Vitals  Enc Vitals Group     BP 11/22/21 1650 (!) 150/71     Pulse Rate 11/22/21 1650 95     Resp 11/22/21 1650 17     Temp 11/22/21 1650 97.9 F (36.6 C)     Temp Source 11/22/21 1650 Oral     SpO2 11/22/21 1650 95 %     Weight 11/22/21 1651 220 lb (99.8 kg)     Height 11/22/21 1651 5\' 5"  (1.651 m)     Head Circumference --      Peak Flow --      Pain Score 11/22/21 1651 3   Constitutional: Alert and oriented.  Eyes: Conjunctivae are normal.  ENT      Head: Normocephalic and atraumatic.      Nose: No congestion/rhinnorhea.      Mouth/Throat: Mucous membranes are moist.      Neck: No stridor. Hematological/Lymphatic/Immunilogical: No cervical lymphadenopathy. Cardiovascular: Normal rate, regular rhythm.  No murmurs, rubs, or gallops.  Respiratory: Normal respiratory effort without tachypnea nor retractions. Breath sounds are clear and equal bilaterally. No wheezes/rales/rhonchi. Gastrointestinal: Soft and non tender. No rebound. No guarding.  Genitourinary: Deferred Musculoskeletal: Normal range of motion in all extremities. No lower extremity edema. Neurologic:  Normal speech and language. No gross focal neurologic deficits are appreciated.  Skin:  Skin is warm, dry and intact. No rash noted. Psychiatric: Mood and affect are normal. Speech and behavior are normal. Patient exhibits appropriate insight and judgment.  ____________________________________________    LABS (pertinent positives/negatives)  BMP na 135, k 3.0, glu 153, cr 4.81 CBC wbc 11.9, hgb 6.1, plt 280  ____________________________________________   EKG  None  ____________________________________________    RADIOLOGY  CXR Enlargement of cardiac silhouette. No acute abnormality  ____________________________________________   PROCEDURES  Procedures  ____________________________________________   INITIAL IMPRESSION / ASSESSMENT AND PLAN / ED COURSE  Pertinent labs &  imaging results that were available during my care of the patient were reviewed by me and considered in my medical decision making (see chart for details).   Patient presented to the emergency department today because of concerns for anemia found at dialysis.  She has been complaining of some shortness of breath with exertion.  Patient has had abnormal uterine bleeding and has followed up with OB/GYN where she was found to have uterine fibroids.  Patient's hemoglobin here is 6.1.  Discussed with OB/GYN who recommended birth control patient does not have any contraindications.  Also discussed with nephrology felt like 1 unit of packed red blood cells would be appropriate at this time.  Plan will be to give patient 1 unit of packed red blood cells here in the emergency department and then plan on discharging.  ____________________________________________   FINAL CLINICAL IMPRESSION(S) /  ED DIAGNOSES  Final diagnoses:  Abnormal vaginal bleeding  Anemia, unspecified type     Note: This dictation was prepared with Dragon dictation. Any transcriptional errors that result from this process are unintentional     Nance Pear, MD 11/22/21 2318

## 2021-11-22 NOTE — ED Triage Notes (Signed)
Pt in via EMS from Dialysis. EMS reports pt with low hbg and MD at dialysis thinks it is secondary to a vaginal bleed.

## 2021-11-22 NOTE — ED Provider Notes (Signed)
Emergency Medicine Provider Triage Evaluation Note  Renee [name] , a 45 y.o. female  was evaluated in triage.  Pt complains of low hemoglobin, discovered at her dialysis appointment today. Endorses vaginal bleeding since October; was passing "fist-sized clots", though is only passing quarter sized clots now. She has been seen at GYN at [facility] and had an ultrasound that showed fibroids. Endorses SOB with exertion. Tested positive for influenza this past week.   Review of Systems  Positive: Vaginal bleeding, SOB Negative: Chest pain, abdominal pain, numbness/tingling in extremities, diarrhea.  Physical Exam  There were no vitals taken for this visit. Gen:   Awake, no distress   Resp:  Normal effort  MSK:   Moves extremities without difficulty  Other:    Medical Decision Making  Medically screening exam initiated at 4:47 PM.  Appropriate orders placed.  Renee [name] was informed that the remainder of the evaluation will be completed by another provider, this initial triage assessment does not replace that evaluation, and the importance of remaining in the ED until their evaluation is complete.  Pt is currently stable. Ultrasound performed by her OB/GYN last week shows fibroids as the source for bleeding. Last CBC on 11/11/2021 was 6.0. No abdominal pain at this time. Currently experiencing persistent respiratory symptoms from recent influenza infection. Will order labs, type/screen, and CXR.     Teodoro Spray, [place] 11/22/21 1700    Nance Pear, MD 11/22/21 [phone]

## 2021-11-22 NOTE — ED Triage Notes (Signed)
Pt states she was sent from dialysis, states they told her her Hgb is 5 today,. Pt states she has had vaginal bleeding since 10/12/2021, at first was passing fist sized clots, now passing quarter sized ones, states she has been seen at GYN at [facility] and had an ultrasound that showed fibroids. Pt is c/o SOB wit exertion, dx with flu last week.

## 2021-11-22 NOTE — Discharge Instructions (Addendum)
Please seek medical attention for any high fevers, chest pain, shortness of breath, change in behavior, persistent vomiting, bloody stool or any other new or concerning symptoms.  

## 2021-11-23 LAB — TYPE AND SCREEN
ABO/RH(D): O POS
Antibody Screen: NEGATIVE
Unit division: 0

## 2021-11-23 LAB — BPAM RBC
Blood Product Expiration Date: 202301012359
ISSUE DATE / TIME: 202211232322
Unit Type and Rh: 5100

## 2021-11-23 MED ORDER — FERROUS SULFATE 325 (65 FE) MG PO TABS: 325.0000 mg | ORAL_TABLET | Freq: Every day | ORAL | 1 refills | Status: AC

## 2021-11-23 MED ORDER — FERROUS SULFATE 325 (65 FE) MG PO TABS: 325.0000 mg | ORAL_TABLET | Freq: Every day | ORAL | 1 refills | Status: DC

## 2021-11-23 MED ORDER — BENZONATATE 100 MG PO CAPS: 100.0000 mg | ORAL_CAPSULE | Freq: Three times a day (TID) | ORAL | 0 refills | Status: DC | PRN

## 2021-11-23 MED ORDER — DESOGESTREL-ETHINYL ESTRADIOL 0.15-30 MG-MCG PO TABS: ORAL_TABLET | ORAL | 0 refills | Status: AC

## 2021-11-23 MED ORDER — BENZONATATE 100 MG PO CAPS: 100.0000 mg | ORAL_CAPSULE | Freq: Three times a day (TID) | ORAL | 0 refills | Status: AC | PRN

## 2021-11-23 NOTE — ED Provider Notes (Signed)
12:20 AM  Assumed care of patient at shift change.  Patient here with symptomatic anemia from heavy vaginal bleeding.  Has known fibroids.  Nephrology and OB/GYN consulted.  Getting 1 unit of packed red blood cells with plan for discharge home afterwards.  States she is feeling better at this time.  Hemodynamically stable.  No significant vaginal bleeding currently.  Plan will be to discharge on birth control.  She did test positive for flu a.  Patient was aware that she had influenza.  States she has been told that she cannot take Tamiflu.  No fevers.  She is nontoxic in appearance.  No hypoxia or increased work of breathing.  2:30 AM  Pt continues to be hemodynamically stable, resting comfortably.  Will discharge once blood complete.   At this time, I do not feel there is any life-threatening condition present. I have reviewed, interpreted and discussed all results (EKG, imaging, lab, urine as appropriate) and exam findings with patient/family. I have reviewed nursing notes and appropriate previous records.  I feel the patient is safe to be discharged home without further emergent workup and can continue workup as an outpatient as needed. Discussed usual and customary return precautions. Patient/family verbalize understanding and are comfortable with this plan.  Outpatient follow-up has been provided as needed. All questions have been answered.    Lunette Tapp, Delice Bison, DO 11/23/21 [phone]

## 2021-11-23 NOTE — ED Notes (Signed)
Pt waiting on sister to pick patient up

## 2022-01-17 ENCOUNTER — Other Ambulatory Visit: Payer: Self-pay | Admitting: Family

## 2022-02-01 ENCOUNTER — Encounter (INDEPENDENT_AMBULATORY_CARE_PROVIDER_SITE_OTHER): Payer: Medicare Other

## 2022-02-01 ENCOUNTER — Other Ambulatory Visit (INDEPENDENT_AMBULATORY_CARE_PROVIDER_SITE_OTHER): Payer: Self-pay | Admitting: Nurse Practitioner

## 2022-02-01 ENCOUNTER — Ambulatory Visit (INDEPENDENT_AMBULATORY_CARE_PROVIDER_SITE_OTHER): Payer: Medicare Other | Admitting: Nurse Practitioner

## 2022-02-01 DIAGNOSIS — N186 End stage renal disease: Secondary | ICD-10-CM

## 2022-10-12 ENCOUNTER — Ambulatory Visit (INDEPENDENT_AMBULATORY_CARE_PROVIDER_SITE_OTHER): Payer: Medicare Other | Admitting: Nurse Practitioner

## 2022-10-12 ENCOUNTER — Encounter (INDEPENDENT_AMBULATORY_CARE_PROVIDER_SITE_OTHER): Payer: Medicare Other

## 2022-10-29 ENCOUNTER — Encounter (INDEPENDENT_AMBULATORY_CARE_PROVIDER_SITE_OTHER): Payer: Self-pay
